# Patient Record
Sex: Male | Born: 1945
Health system: Southern US, Community
[De-identification: ages and names within clinical notes are randomized; demographics above are authoritative.]

## PROBLEM LIST (undated history)

## (undated) DIAGNOSIS — M1990 Unspecified osteoarthritis, unspecified site: Secondary | ICD-10-CM

## (undated) DIAGNOSIS — G4730 Sleep apnea, unspecified: Secondary | ICD-10-CM

## (undated) DIAGNOSIS — K589 Irritable bowel syndrome without diarrhea: Secondary | ICD-10-CM

## (undated) DIAGNOSIS — E7800 Pure hypercholesterolemia, unspecified: Secondary | ICD-10-CM

## (undated) DIAGNOSIS — J8410 Pulmonary fibrosis, unspecified: Secondary | ICD-10-CM

## (undated) DIAGNOSIS — G8929 Other chronic pain: Secondary | ICD-10-CM

## (undated) DIAGNOSIS — K219 Gastro-esophageal reflux disease without esophagitis: Secondary | ICD-10-CM

## (undated) DIAGNOSIS — F419 Anxiety disorder, unspecified: Secondary | ICD-10-CM

## (undated) DIAGNOSIS — M549 Dorsalgia, unspecified: Secondary | ICD-10-CM

## (undated) DIAGNOSIS — R002 Palpitations: Secondary | ICD-10-CM

## (undated) DIAGNOSIS — I10 Essential (primary) hypertension: Secondary | ICD-10-CM

## (undated) DIAGNOSIS — L409 Psoriasis, unspecified: Secondary | ICD-10-CM

## (undated) DIAGNOSIS — K5792 Diverticulitis of intestine, part unspecified, without perforation or abscess without bleeding: Secondary | ICD-10-CM

## (undated) DIAGNOSIS — K635 Polyp of colon: Secondary | ICD-10-CM

## (undated) HISTORY — DX: Polyp of colon: K63.5

## (undated) HISTORY — PX: TONSILLECTOMY: SUR1361

## (undated) HISTORY — DX: Unspecified osteoarthritis, unspecified site: M19.90

## (undated) HISTORY — DX: Pulmonary fibrosis, unspecified: J84.10

## (undated) HISTORY — PX: COLONOSCOPY: SHX174

## (undated) HISTORY — DX: Psoriasis, unspecified: L40.9

## (undated) HISTORY — DX: Sleep apnea, unspecified: G47.30

## (undated) HISTORY — DX: Irritable bowel syndrome, unspecified: K58.9

## (undated) HISTORY — DX: Other chronic pain: G89.29

## (undated) HISTORY — PX: KNEE ARTHROSCOPY: SUR90

## (undated) HISTORY — PX: FOOT FRACTURE SURGERY: SHX645

## (undated) HISTORY — DX: Dorsalgia, unspecified: M54.9

## (undated) HISTORY — PX: UPPER GASTROINTESTINAL ENDOSCOPY: SHX188

---

## 1998-03-17 ENCOUNTER — Emergency Department (HOSPITAL_COMMUNITY): Admission: EM | Admit: 1998-03-17 | Discharge: 1998-03-17 | Payer: Self-pay | Admitting: Emergency Medicine

## 1998-05-31 ENCOUNTER — Encounter: Payer: Self-pay | Admitting: Emergency Medicine

## 1998-05-31 ENCOUNTER — Emergency Department (HOSPITAL_COMMUNITY): Admission: EM | Admit: 1998-05-31 | Discharge: 1998-05-31 | Payer: Self-pay | Admitting: Emergency Medicine

## 1998-06-08 ENCOUNTER — Emergency Department (HOSPITAL_COMMUNITY): Admission: EM | Admit: 1998-06-08 | Discharge: 1998-06-08 | Payer: Self-pay | Admitting: Emergency Medicine

## 1998-06-08 ENCOUNTER — Encounter: Payer: Self-pay | Admitting: Family Medicine

## 1998-06-08 ENCOUNTER — Encounter: Payer: Self-pay | Admitting: Emergency Medicine

## 1998-06-08 ENCOUNTER — Ambulatory Visit (HOSPITAL_COMMUNITY): Admission: RE | Admit: 1998-06-08 | Discharge: 1998-06-08 | Payer: Self-pay | Admitting: Family Medicine

## 1998-08-19 ENCOUNTER — Emergency Department (HOSPITAL_COMMUNITY): Admission: EM | Admit: 1998-08-19 | Discharge: 1998-08-19 | Payer: Self-pay | Admitting: Emergency Medicine

## 1998-08-20 ENCOUNTER — Ambulatory Visit (HOSPITAL_COMMUNITY): Admission: RE | Admit: 1998-08-20 | Discharge: 1998-08-20 | Payer: Self-pay | Admitting: *Deleted

## 1998-10-02 ENCOUNTER — Encounter: Payer: Self-pay | Admitting: General Surgery

## 1998-10-02 ENCOUNTER — Ambulatory Visit (HOSPITAL_COMMUNITY): Admission: RE | Admit: 1998-10-02 | Discharge: 1998-10-02 | Payer: Self-pay | Admitting: General Surgery

## 2000-02-20 ENCOUNTER — Encounter: Payer: Self-pay | Admitting: Emergency Medicine

## 2000-02-20 ENCOUNTER — Emergency Department (HOSPITAL_COMMUNITY): Admission: EM | Admit: 2000-02-20 | Discharge: 2000-02-20 | Payer: Self-pay | Admitting: Emergency Medicine

## 2000-08-06 ENCOUNTER — Emergency Department (HOSPITAL_COMMUNITY): Admission: EM | Admit: 2000-08-06 | Discharge: 2000-08-06 | Payer: Self-pay | Admitting: *Deleted

## 2000-08-06 ENCOUNTER — Encounter: Payer: Self-pay | Admitting: *Deleted

## 2001-10-08 ENCOUNTER — Encounter: Payer: Self-pay | Admitting: Family Medicine

## 2001-10-08 ENCOUNTER — Encounter: Admission: RE | Admit: 2001-10-08 | Discharge: 2001-10-08 | Payer: Self-pay | Admitting: Family Medicine

## 2002-06-15 ENCOUNTER — Encounter: Payer: Self-pay | Admitting: Family Medicine

## 2002-06-15 ENCOUNTER — Encounter: Admission: RE | Admit: 2002-06-15 | Discharge: 2002-06-15 | Payer: Self-pay | Admitting: Family Medicine

## 2002-12-16 ENCOUNTER — Inpatient Hospital Stay (HOSPITAL_COMMUNITY): Admission: EM | Admit: 2002-12-16 | Discharge: 2002-12-18 | Payer: Self-pay | Admitting: Emergency Medicine

## 2003-02-06 DIAGNOSIS — E785 Hyperlipidemia, unspecified: Secondary | ICD-10-CM | POA: Insufficient documentation

## 2003-03-17 ENCOUNTER — Encounter: Payer: Self-pay | Admitting: Gastroenterology

## 2004-02-21 ENCOUNTER — Observation Stay (HOSPITAL_COMMUNITY): Admission: EM | Admit: 2004-02-21 | Discharge: 2004-02-22 | Payer: Self-pay | Admitting: Emergency Medicine

## 2004-02-21 ENCOUNTER — Ambulatory Visit: Payer: Self-pay | Admitting: *Deleted

## 2004-03-04 ENCOUNTER — Ambulatory Visit: Payer: Self-pay | Admitting: Family Medicine

## 2004-03-29 ENCOUNTER — Ambulatory Visit: Payer: Self-pay | Admitting: Family Medicine

## 2004-04-02 ENCOUNTER — Ambulatory Visit: Payer: Self-pay | Admitting: Family Medicine

## 2004-05-20 ENCOUNTER — Ambulatory Visit: Payer: Self-pay | Admitting: Family Medicine

## 2004-05-29 ENCOUNTER — Ambulatory Visit: Payer: Self-pay | Admitting: Family Medicine

## 2004-07-16 ENCOUNTER — Ambulatory Visit: Payer: Self-pay | Admitting: Family Medicine

## 2004-08-30 ENCOUNTER — Ambulatory Visit: Payer: Self-pay | Admitting: Family Medicine

## 2004-09-09 ENCOUNTER — Ambulatory Visit: Payer: Self-pay | Admitting: Family Medicine

## 2004-09-18 ENCOUNTER — Ambulatory Visit: Payer: Self-pay | Admitting: Family Medicine

## 2004-10-01 ENCOUNTER — Ambulatory Visit: Payer: Self-pay | Admitting: Family Medicine

## 2004-10-03 ENCOUNTER — Ambulatory Visit: Payer: Self-pay | Admitting: Family Medicine

## 2005-02-07 ENCOUNTER — Ambulatory Visit: Payer: Self-pay | Admitting: Family Medicine

## 2005-02-20 ENCOUNTER — Ambulatory Visit: Payer: Self-pay | Admitting: Family Medicine

## 2005-03-03 ENCOUNTER — Ambulatory Visit: Payer: Self-pay | Admitting: Family Medicine

## 2005-03-24 ENCOUNTER — Ambulatory Visit: Payer: Self-pay | Admitting: Family Medicine

## 2005-03-26 ENCOUNTER — Ambulatory Visit: Payer: Self-pay | Admitting: Family Medicine

## 2005-05-06 ENCOUNTER — Ambulatory Visit: Payer: Self-pay | Admitting: Family Medicine

## 2005-05-10 ENCOUNTER — Ambulatory Visit: Payer: Self-pay | Admitting: Family Medicine

## 2005-06-13 ENCOUNTER — Ambulatory Visit: Payer: Self-pay | Admitting: Family Medicine

## 2005-09-09 ENCOUNTER — Ambulatory Visit: Payer: Self-pay | Admitting: Family Medicine

## 2006-02-02 ENCOUNTER — Ambulatory Visit: Payer: Self-pay | Admitting: Family Medicine

## 2006-05-20 ENCOUNTER — Ambulatory Visit (HOSPITAL_COMMUNITY): Admission: RE | Admit: 2006-05-20 | Discharge: 2006-05-20 | Payer: Self-pay | Admitting: Family Medicine

## 2007-04-15 ENCOUNTER — Emergency Department (HOSPITAL_COMMUNITY): Admission: EM | Admit: 2007-04-15 | Discharge: 2007-04-15 | Payer: Self-pay | Admitting: Emergency Medicine

## 2007-12-19 ENCOUNTER — Emergency Department (HOSPITAL_COMMUNITY): Admission: EM | Admit: 2007-12-19 | Discharge: 2007-12-19 | Payer: Self-pay | Admitting: Emergency Medicine

## 2008-03-06 ENCOUNTER — Telehealth: Payer: Self-pay | Admitting: Gastroenterology

## 2008-03-06 DIAGNOSIS — K2970 Gastritis, unspecified, without bleeding: Secondary | ICD-10-CM | POA: Insufficient documentation

## 2008-03-06 DIAGNOSIS — K219 Gastro-esophageal reflux disease without esophagitis: Secondary | ICD-10-CM | POA: Insufficient documentation

## 2008-03-06 DIAGNOSIS — K2990 Gastroduodenitis, unspecified, without bleeding: Secondary | ICD-10-CM

## 2008-03-06 DIAGNOSIS — I10 Essential (primary) hypertension: Secondary | ICD-10-CM | POA: Insufficient documentation

## 2008-03-07 ENCOUNTER — Ambulatory Visit: Payer: Self-pay | Admitting: Gastroenterology

## 2008-03-07 DIAGNOSIS — R198 Other specified symptoms and signs involving the digestive system and abdomen: Secondary | ICD-10-CM | POA: Insufficient documentation

## 2008-03-07 DIAGNOSIS — R1010 Upper abdominal pain, unspecified: Secondary | ICD-10-CM | POA: Insufficient documentation

## 2008-03-07 DIAGNOSIS — R143 Flatulence: Secondary | ICD-10-CM

## 2008-03-07 DIAGNOSIS — R142 Eructation: Secondary | ICD-10-CM | POA: Insufficient documentation

## 2008-03-07 DIAGNOSIS — R109 Unspecified abdominal pain: Secondary | ICD-10-CM | POA: Insufficient documentation

## 2008-03-07 DIAGNOSIS — R141 Gas pain: Secondary | ICD-10-CM | POA: Insufficient documentation

## 2008-03-08 LAB — CONVERTED CEMR LAB
ALT: 22 units/L (ref 0–53)
AST: 21 units/L (ref 0–37)
Albumin: 3.9 g/dL (ref 3.5–5.2)
Alkaline Phosphatase: 41 units/L (ref 39–117)
BUN: 9 mg/dL (ref 6–23)
Basophils Absolute: 0.1 10*3/uL (ref 0.0–0.1)
Basophils Relative: 0.6 % (ref 0.0–3.0)
CO2: 28 meq/L (ref 19–32)
Calcium: 9.3 mg/dL (ref 8.4–10.5)
Chloride: 102 meq/L (ref 96–112)
Creatinine, Ser: 1.2 mg/dL (ref 0.4–1.5)
Eosinophils Absolute: 0.2 10*3/uL (ref 0.0–0.7)
Eosinophils Relative: 1.8 % (ref 0.0–5.0)
GFR calc Af Amer: 79 mL/min
GFR calc non Af Amer: 65 mL/min
Glucose, Bld: 107 mg/dL — ABNORMAL HIGH (ref 70–99)
HCT: 44.3 % (ref 39.0–52.0)
Hemoglobin: 15.2 g/dL (ref 13.0–17.0)
Lymphocytes Relative: 19.6 % (ref 12.0–46.0)
MCHC: 34.4 g/dL (ref 30.0–36.0)
MCV: 97.7 fL (ref 78.0–100.0)
Monocytes Absolute: 1.1 10*3/uL — ABNORMAL HIGH (ref 0.1–1.0)
Monocytes Relative: 10.1 % (ref 3.0–12.0)
Neutro Abs: 7 10*3/uL (ref 1.4–7.7)
Neutrophils Relative %: 67.9 % (ref 43.0–77.0)
Platelets: 268 10*3/uL (ref 150–400)
Potassium: 4.2 meq/L (ref 3.5–5.1)
RBC: 4.54 M/uL (ref 4.22–5.81)
RDW: 12.9 % (ref 11.5–14.6)
Sodium: 138 meq/L (ref 135–145)
Total Bilirubin: 0.7 mg/dL (ref 0.3–1.2)
Total Protein: 7 g/dL (ref 6.0–8.3)
WBC: 10.4 10*3/uL (ref 4.5–10.5)

## 2008-03-10 ENCOUNTER — Ambulatory Visit: Payer: Self-pay | Admitting: Gastroenterology

## 2008-03-10 ENCOUNTER — Encounter: Payer: Self-pay | Admitting: Gastroenterology

## 2008-03-13 ENCOUNTER — Encounter: Payer: Self-pay | Admitting: Gastroenterology

## 2008-03-16 ENCOUNTER — Telehealth (INDEPENDENT_AMBULATORY_CARE_PROVIDER_SITE_OTHER): Payer: Self-pay

## 2008-03-30 ENCOUNTER — Ambulatory Visit (HOSPITAL_COMMUNITY): Admission: RE | Admit: 2008-03-30 | Discharge: 2008-03-30 | Payer: Self-pay | Admitting: Family Medicine

## 2008-04-06 ENCOUNTER — Ambulatory Visit: Payer: Self-pay | Admitting: Gastroenterology

## 2008-04-06 DIAGNOSIS — K589 Irritable bowel syndrome without diarrhea: Secondary | ICD-10-CM | POA: Insufficient documentation

## 2009-02-02 ENCOUNTER — Emergency Department (HOSPITAL_COMMUNITY): Admission: EM | Admit: 2009-02-02 | Discharge: 2009-02-02 | Payer: Self-pay | Admitting: Emergency Medicine

## 2009-04-19 ENCOUNTER — Ambulatory Visit: Payer: Self-pay | Admitting: Gastroenterology

## 2009-04-19 DIAGNOSIS — Z860100 Personal history of colon polyps, unspecified: Secondary | ICD-10-CM | POA: Insufficient documentation

## 2010-02-24 ENCOUNTER — Encounter: Payer: Self-pay | Admitting: Emergency Medicine

## 2010-03-05 NOTE — Assessment & Plan Note (Signed)
Summary: yearly f.u...em   History of Present Illness Visit Type: consult  Primary GI MD: Elie Goody [facility] Primary Provider: Karleen Hampshire, MD Requesting Provider: Karleen Hampshire, MD Chief Complaint: Yearly f/u for IBS and GERD. Pt states that he has been doing great and denies any GI complaints  History of Present Illness:   [name] has had very good control of his reflux symptoms and irritable bowel syndrome. He still has urgency stools and loose stools on occasion.   GI Review of Systems      Denies abdominal pain, acid reflux, belching, bloating, chest pain, dysphagia with liquids, dysphagia with solids, heartburn, loss of appetite, nausea, vomiting, vomiting blood, weight loss, and  weight gain.        Denies anal fissure, black tarry stools, change in bowel habit, constipation, diarrhea, diverticulosis, fecal incontinence, heme positive stool, hemorrhoids, irritable bowel syndrome, jaundice, light color stool, liver problems, rectal bleeding, and  rectal pain.   Current Medications (verified): 1)  Cardura 4 Mg Tabs (Doxazosin Mesylate) .Marland Kitchen.. 1 Tab By Mouth Once Daily 2)  Metoprolol Tartrate 25 Mg Tabs (Metoprolol Tartrate) .Marland Kitchen.. 1 Tab By Mouth Two Times A Day 3)  Lipitor 10 Mg Tabs (Atorvastatin Calcium) .Marland Kitchen.. 1 Tab By Mouth Once Daily 4)  Alprazolam 0.5 Mg Tabs (Alprazolam) .... As Needed 5)  Levbid 0.375 Mg  Tb12 (Hyoscyamine Sulfate) .... One Tab Twice Daily For Cramp Relief 6)  Omeprazole 20 Mg Cpdr (Omeprazole) .... One Tablet By Mouth Once Daily  Allergies (verified): 1)  ! Keflex  Past History:  Past Medical History: IBS GASTRITIS (ICD-535.50) GERD (ICD-530.81) HYPERTENSION (ICD-401.9) HYPERLIPIDEMIA (ICD-272.4) Chronic interstitial lung disease Tubulovillous adenomatous colon polyps, 03/2008  Past Surgical History: Reviewed history from 03/07/2008 and no changes required. Hernia repair Knee surgery Tonsillectomy  Family History: Bladder Cancer   Father Breast Cancer  Sister Renal Failure  Mother No FH of Colon Cancer:  Social History: Reviewed history from 03/07/2008 and no changes required. Married Nurse, learning disability Patient has never smoked.  Alcohol Use - yes Illicit Drug Use - no  Review of Systems       The pertinent positives and negatives are noted as above and in the HPI. All other ROS were reviewed and were negative.   Vital Signs:  Patient profile:   65 year old male Height:      71 inches Weight:      259 pounds BMI:     36.25 BSA:     2.36 Pulse rate:   68 / minute Pulse rhythm:   regular BP sitting:   136 / 84  (left arm) Cuff size:   regular  Vitals Entered By: Ok Anis CMA (April 19, 2009 8:43 AM)  Physical Exam  General:  Well developed, well nourished, no acute distress. Head:  Normocephalic and atraumatic. Eyes:  PERRLA, no icterus. Mouth:  No deformity or lesions, dentition normal. Lungs:  Clear throughout to auscultation. Heart:  Regular rate and rhythm; no murmurs, rubs,  or bruits. Abdomen:  Soft, nontender and nondistended. No masses, hepatosplenomegaly or hernias noted. Normal bowel sounds. Psych:  Alert and cooperative. Normal mood and affect.  Impression & Recommendations:  Problem # 1:  IRRITABLE BOWEL SYNDROME (ICD-564.1) Irritable bowel syndrome, under good control on Levbid. He still has loose urgent stools. Trial of Align once daily for one month. Continue if symptoms improved.  Problem # 2:  GERD (ICD-530.81) Continue omeprazole 20 mg q.a.m. and standard antireflux measures.  Problem # 3:  PERSONAL HX COLONIC POLYPS (ICD-V12.72) Tubulovillous adenomatous colon polyps. Surveillance colonoscopy recommended in February 2013.  Patient Instructions: 1)  Pick up your prescription from your pharmacy.  2)  Please continue current medications.  3)  Please schedule a follow-up appointment in 1 year. 4)  Copy sent to : Karleen Hampshire, MD 5)  The medication list was reviewed and  reconciled.  All changed / newly prescribed medications were explained.  A complete medication list was provided to the patient / caregiver.  Prescriptions: LEVBID 0.375 MG  TB12 (HYOSCYAMINE SULFATE) one tab twice daily for cramp relief as needed  #60 x 11   Entered by:   Christie Nottingham CMA (AAMA)   Authorized by:   Meryl Dare MD [facility]   Signed by:   Christie Nottingham CMA (AAMA) on 04/19/2009   Method used:   Electronically to        [facility]. [phone] (retail)       [address]       [place], [place]  [address]       Ph: [phone] or [phone]       Fax: [phone]   RxID:   [id number]

## 2010-05-03 ENCOUNTER — Ambulatory Visit (HOSPITAL_COMMUNITY)
Admission: RE | Admit: 2010-05-03 | Discharge: 2010-05-03 | Disposition: A | Discharge status: Home / Self Care | Payer: No Typology Code available for payment source | Source: Ambulatory Visit | Attending: Internal Medicine | Admitting: Internal Medicine

## 2010-05-03 ENCOUNTER — Other Ambulatory Visit (HOSPITAL_COMMUNITY): Payer: Self-pay | Admitting: Internal Medicine

## 2010-05-03 DIAGNOSIS — M7730 Calcaneal spur, unspecified foot: Secondary | ICD-10-CM | POA: Insufficient documentation

## 2010-05-03 DIAGNOSIS — M25579 Pain in unspecified ankle and joints of unspecified foot: Secondary | ICD-10-CM | POA: Insufficient documentation

## 2010-05-03 DIAGNOSIS — R52 Pain, unspecified: Secondary | ICD-10-CM

## 2010-05-06 LAB — DIFFERENTIAL
Basophils Absolute: 0.1 10*3/uL (ref 0.0–0.1)
Basophils Relative: 1 % (ref 0–1)
Eosinophils Absolute: 0.5 10*3/uL (ref 0.0–0.7)
Eosinophils Relative: 5 % (ref 0–5)
Lymphocytes Relative: 24 % (ref 12–46)
Lymphs Abs: 2.3 10*3/uL (ref 0.7–4.0)
Monocytes Absolute: 0.9 10*3/uL (ref 0.1–1.0)
Monocytes Relative: 9 % (ref 3–12)
Neutro Abs: 5.9 10*3/uL (ref 1.7–7.7)
Neutrophils Relative %: 61 % (ref 43–77)

## 2010-05-06 LAB — CBC
HCT: 44.4 % (ref 39.0–52.0)
Hemoglobin: 15.4 g/dL (ref 13.0–17.0)
MCHC: 34.8 g/dL (ref 30.0–36.0)
MCV: 97.3 fL (ref 78.0–100.0)
Platelets: 232 10*3/uL (ref 150–400)
RBC: 4.56 MIL/uL (ref 4.22–5.81)
RDW: 13.4 % (ref 11.5–15.5)
WBC: 9.6 10*3/uL (ref 4.0–10.5)

## 2010-05-06 LAB — BASIC METABOLIC PANEL
BUN: 18 mg/dL (ref 6–23)
CO2: 30 mEq/L (ref 19–32)
Calcium: 9 mg/dL (ref 8.4–10.5)
Chloride: 102 mEq/L (ref 96–112)
Creatinine, Ser: 1.06 mg/dL (ref 0.4–1.5)
GFR calc Af Amer: >60 mL/min (ref >60)
GFR calc non Af Amer: >60 mL/min (ref >60)
Glucose, Bld: 133 mg/dL — ABNORMAL HIGH (ref 70–99)
Potassium: 3.9 mEq/L (ref 3.5–5.1)
Sodium: 140 mEq/L (ref 135–145)

## 2010-05-06 LAB — ETHANOL: Alcohol, Ethyl (B): <5 mg/dL (ref 0–10)

## 2010-06-21 NOTE — Discharge Summary (Signed)
NAMEJOHNATHYN, [name]                ACCOUNT NO.:  [id number]   MEDICAL RECORD NO.:  [id number]          PATIENT TYPE:  INP   LOCATION:  4707                         FACILITY:  [facility]   PHYSICIAN:  Carole Binning, M.D. LHCDATE OF BIRTH:  Jul 02, 1945   DATE OF ADMISSION:  02/21/2004  DATE OF DISCHARGE:                                 DISCHARGE SUMMARY   PROCEDURES:  1.  Cardiac catheterization.  2.  Coronary arteriogram.  3.  Left ventriculogram.   HOSPITAL COURSE:  Alexander Nunez is a 65 year old male with no known history of  coronary artery disease.  He has a history of palpitations and right bundle  branch block.  He had a remote exercise Cardiolite that was without  abnormality, per the patient.  Alexander Nunez developed chest tightness at  approximately 10 a.m. while at work.  His symptoms got worse with  ambulation, and the discomfort would not go away.  He continued to complain  of chest pressure and came to the emergency room.  He was admitted for  further evaluation and treatment.  His cardiac enzymes were negative for MI.  It was felt that he needed better blood pressure control, and Lopressor was  added to his medication regimen.  Dr. Gerri Spore evaluated him and felt that  cardiac catheterization was indicated, and this was performed on February 22, 2004.   Dr. Juanda Chance performed the cardiac catheterization, and his coronary arteries  and his left ventriculogram were all normal.  Dr. Juanda Chance felt that Alexander Nunez  could be discharged home when his bedrest was complete.  He recommended the  addition of Lopressor for blood pressure control, and then patient had  tolerated this well.  Alexander Nunez is tentatively considered stable for  discharge on February 22, 2004 and is to follow up as an outpatient with Dr.  Hetty Ely and with Dr. Gerri Spore on a p.r.n. basis.   DISCHARGE DIAGNOSES:  1.  Chest pain, no significant coronary artery disease by catheterization.  2.  Hypertension.  3.   Hyperlipidemia.  4.  Anxiety.  5.  Remote history of tobacco use.  6.  Depression.  7.  Obesity.  8.  Gastroesophageal reflux disease symptoms.  9.  Status post foot surgery and tonsillectomy.   DISCHARGE INSTRUCTIONS:  1.  Activity levels to include no strenuous activity for two days.  2.  He is to call the office for problems with the cath site.  3.  He is to stick to a low-fat diet.  4.  He is to follow up with Dr. Gerri Spore as needed and see Dr. Hetty Ely      within two weeks.   DISCHARGE MEDICATIONS:  1.  Lipitor 20 mg q.d.  2.  Xanax 0.25 mg p.r.n.  3.  Cardura 4 mg q.d.  4.  Zoloft 50 mg q.d.  5.  Aciphex 20 mg q.d.  6.  Aspirin 81 mg q.d.      RB/MEDQ  D:  02/22/2004  T:  02/22/2004  Job:  [id number]   cc:   Laurita Quint, M.D.  [address] [place]  [place]  [place] [address]  Fax: [phone]

## 2010-06-21 NOTE — H&P (Signed)
Alexander Nunez, Alexander Nunez NO.:  [id number]   MEDICAL RECORD NO.:  [id number]          PATIENT TYPE:  INP   LOCATION:  1825                         FACILITY:  [facility]   PHYSICIAN:  Carole Binning, M.D. LHCDATE OF BIRTH:  1945-04-01   DATE OF ADMISSION:  02/21/2004  DATE OF DISCHARGE:                                HISTORY & PHYSICAL   CHIEF COMPLAINT:  Chest pain.   HISTORY OF PRESENT ILLNESS:  This is a 65 year old male who presents to  [facility] emergency room for evaluation of chest pain.  The  patient has a previous history of palpitations, and a history of right  bundle branch block.  He had been see in the past by Dr. Luther Parody and  Dr. Garnette Scheuermann.  He had an exercise Cardiolite, he believes about 6 years  ago, that was negative according to the patient.   The patient was at work approximately 10 a.m. today when he developed chest  pressure.  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  He stated it felt somewhat like his indigestion that he normally  has; however, it was higher in his chest than normal.  He reports it as  being a pressure sensation, at worst an 8 on a 1-10 scale, with no  associated symptoms such as shortness of breath, diaphoresis, or nausea.  There was no radiation of the pain, except across his chest.  The pain was  continuous.  He went to eat lunch, but decided that he felt so bad he  thought he should come to the emergency department.  On arrival to the [facility] emergency room, he received aspirin, nitroglycerin, and  morphine, with some relief in his pain; however, he continued to have pain  rated at approximately 3-4 on a 1-10 scale.  In talking to the patient, he  also remembers having severe indigestion Sunday night.  The patient was seen  by Dr. Gerri Spore and myself, and the decision was made to admit the patient  to rule out an MI, and to plan for cardiac catheterization  tomorrow.   PAST MEDICAL HISTORY:  1.  Significant for previous chest pain approximately one year ago.  MI was      ruled out at that time.  The patient had an upper endoscopy that was      negative.  Apparently, he did not have a stress test.  2.  He has a history of anxiety and possible depression, he has been treated      with Xanax and Zoloft.  3.  He has a history of hypertension.  4.  Elevated cholesterol levels.  5.  He has a remote tobacco history.  He did smoke approximately 2 packs per      day for about 20 years.  He quit 25-30 years ago.  6.  He has had foot surgery.  7.  Tonsil surgery.  8.  He has no history of diabetes.   ALLERGIES:  KEFLEX.   MEDICATIONS PRIOR TO ADMISSION:  1.  Lipitor, dosage unknown.  2.  Xanax 0.25 mg p.r.n.  3.  Cardura 4 mg daily.  4.  Zoloft, dosage uncertain, but he believes 50 mg per day.  5.  Aciphex daily.   SOCIAL HISTORY:  The patient is married.  He lives in [place].  He has 2  sons.  He walks occasionally for exercise.  He quit smoking 25-30 years ago.  He drinks 3-4 beers per day.  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   FAMILY HISTORY:  The patient's mother died in her 39s from renal failure and  congestive heart failure.  His father died from cancer in his 66s.  He had a  sister who died from cancer.  Another sister is alive and well in her 47s.   REVIEW OF SYSTEMS:  Please see history of present illness.  Also, the  patient has had some recent sinus headaches.  He has an occasional rash that  has been evaluated by a dermatologist in the past with no specific  diagnosis.  He has chest pain, as noted above.  He has a history of  palpitations.  History of anxiety and depression.  Gastroesophageal reflux  disease and frequent indigestion.   PHYSICAL EXAMINATION:  GENERAL:  A pleasant 65 year old white male in no  acute distress.  VITAL SIGNS:  Blood pressure 133/75, pulse 74.  HEENT:   Unremarkable.  NECK:  No bruits, no jugular venous distention.  HEART:  Regular rate and rhythm without murmur.  LUNGS:  Clear.  ABDOMEN:  Obese soft, nontender, with positive bowel sounds.  EXTREMITIES:  Pulses intact without edema.  SKIN:  Warm and dry.  NEUROLOGIC:  Grossly intact.   LABORATORY DATA:  Chest x-ray showed mild cardiomegaly with no active  disease.  An EKG shows normal sinus rhythm, rate 82 beats per minute, with  an incomplete right bundle branch block.  Other labs are significant for a  BUN of 13, creatinine 1.1, potassium 3.9, glucose 102.  LFT's were normal.  PTT was 28.  CBC revealed a hemoglobin of 15.8, hematocrit 45.2, WBC's 8.2,  platelets 274,000.  Point of care enzymes were negative x1.   IMPRESSION:  1.  Chest pain of uncertain etiology.  2.  History of hypertension.  3.  Elevated cholesterol levels.  4.  Remote tobacco history.  5.  History of obesity.  6.  History of anxiety and depression.  7.  Negative exercise Cardiolite approximately 6 years ago.  8.  History of gastroesophageal reflux disease and indigestion.   PLAN:  Admit the patient to rule out a myocardial infarction.  We will start  him on IV nitroglycerin, p.o. beta blocker, aspirin, and IV heparin.  He  will be placed on the cath board for tomorrow as an add on.  He will be  cathed emergently if his condition worsens.      DR/MEDQ  D:  02/21/2004  T:  02/21/2004  Job:  [id number]   cc:   Laurita Quint, M.D.  [address] [place]  [place] [address]  Fax: [phone]

## 2010-06-21 NOTE — Discharge Summary (Signed)
NAME:  Alexander Nunez, Alexander Nunez                          ACCOUNT NO.:  [id number]   MEDICAL RECORD NO.:  [id number]                   PATIENT TYPE:  INP   LOCATION:  4731                                 FACILITY:  [facility]   PHYSICIAN:  Bruce Rexene Edison. Swords, M.D. [facility]           DATE OF BIRTH:  09/10/1945   DATE OF ADMISSION:  12/16/2002  DATE OF DISCHARGE:  12/18/2002                                 DISCHARGE SUMMARY   DISCHARGE DIAGNOSES:  1. Epigastric/chest pain, unknown etiology.  2. Hyperlipidemia.  3. Hypertension.  4. Gastroesophageal reflux.  5. Ventral hernia repair.  6. History of knee surgery.  7. Mild chronic interstitial lung disease (unknown diagnosis).   DISCHARGE MEDICATIONS:  1. Nexium 40 mg p.o. daily.  2. Cardura 4 mg p.o. q.h.s.  3. Lipitor 20 mg p.o. daily.  4. Xanax p.r.n.  5. Vicodin 1-2 tablets p.o. q.6h. p.r.n.   FOLLOW-UP:  Dr. Hetty Ely this week.  I think he probably should have an  endoscopy and a stress Cardiolite scheduled.   CONDITION ON DISCHARGE:  Improved but still with some epigastric discomfort.   HOSPITAL PROCEDURES:  1. CT abdomen and pelvis.  Preliminary report:  Abdomen is normal.  Pelvis     demonstrates some sigmoid diverticula.  Final report pending.  2. Chest x-ray demonstrated cardiomegaly and mild chronic interstitial lung     disease.   HOSPITAL LABORATORIES:  Hepatic function panel normal.  CBC normal.  Lipase  normal at 26, amylase normal at 88.  Cardiac enzymes were all normal.  CBC  on admission was normal with a hemoglobin of 15.5 and a white count of 7000.   HOSPITAL COURSE:  The patient was admitted to the hospital service on  December 16, 2002.  Please see my dictated note for details.  On the first  day the patient complained of chest tightness and epigastric discomfort  which he thought was related to reflux pain but not relieved with typical  antacids or PPI.  The patient was admitted, ruled out for an MI.  The pain  persisted  the following day.  A CT of the abdomen and pelvis was obtained  along with additional laboratory work as outlined above.  Results of the CT  as above.  Etiology of pain unknown.  I had a  discussion with the patient and wife at the time of discharge.  I think he  will need an outpatient endoscopy and stress Cardiolite performed.  Those  arrangements will be made with Dr. Hetty Ely.  I have left a message for Dr.  Hetty Ely, and if there are any problems the patient knows to contact me at  my office.  Bruce Rexene Edison Swords, M.D. [facility]    BHS/MEDQ  D:  12/18/2002  T:  12/18/2002  Job:  [id number]   cc:   Laurita Quint, M.D.  [address] [place]  [place] [address]  Fax: [phone]

## 2010-06-21 NOTE — Cardiovascular Report (Signed)
NAME:  TRICE, ASPINALL NO.:  [id number]   MEDICAL RECORD NO.:  [id number]          PATIENT TYPE:  INP   LOCATION:  4707                         FACILITY:  [facility]   PHYSICIAN:  Charlies Constable, M.D. [facility] DATE OF BIRTH:  [birth year]   DATE OF PROCEDURE:  02/22/2004  DATE OF DISCHARGE:                              CARDIAC CATHETERIZATION   CLINICAL HISTORY:  Mr. Gjerde is 65 years old and has no prior history of  known heart disease, although he does have positive risk factors.  He was  admitted to the hospital yesterday with chest tightness which occurred at  work and then became worse with activity.  He came to the [facility]  Emergency Room and was admitted by Dr. Gerri Spore.   DESCRIPTION OF PROCEDURE:  The procedure was performed via the right femoral  artery.  An arterial sheath and 6 [place] free form coronary catheters.  A  femoral arterial puncture was performed and Omnipaque contrast was used.  A  distal aortogram was performed to rule out renovascular causes for  hypertension.  The right femoral artery was closed with Angio-Seal at the  end of the procedure.  The patient tolerated the procedure well and left the  laboratory in satisfactory condition.   RESULTS:  Left main coronary artery:  The left main coronary artery was free  of significant disease.   Left anterior descending artery:  The left anterior descending artery gave  rise to two diagonal branches and a large and three small septal  perforators.  These were free of significant disease.   Circumflex artery:  The circumflex artery gave rise to a ramus branch, a  marginal branch, an atrial branch and two posterolateral branches.  These  vessels were free of significant disease.   Right coronary artery:  The right coronary artery was a moderate size vessel  and gave rise to a conus branch, two right ventricular branches, a posterior  descending branch and two posterolateral branches.  These vessels were  free  of significant disease.   Left ventriculogram:  The left ventriculogram performed in the RAO  projection showed good wall motion with no areas of hypokinesis.  The  estimated ejection fraction was 60%.   Distal aortogram:  A distal aortogram was performed which showed patent  renal arteries and no significant aortic obstruction.   The aortic pressure was 165/92 with a mean of 123.  The left ventricular  pressure was 165/10.   CONCLUSION:  Normal coronary angiography and left ventricular function.   RECOMMENDATIONS:  Reassurance.  In view of these findings, I think that the  patient's symptoms are not cardiac.  Will plan discharge later today and  arrange followup with Dr. Hetty Ely.  I discussed the findings with Dr.  Hetty Ely.       BB/MEDQ  D:  02/22/2004  T:  02/22/2004  Job:  [id number]   cc:   Laurita Quint, M.D.  [address] [place]  [place] [address]  Fax: [phone]   Cardiopulmonary Laboratory

## 2010-06-21 NOTE — H&P (Signed)
NAME:  Alexander Nunez, LEINER                          ACCOUNT NO.:  [id number]   MEDICAL RECORD NO.:  [id number]                   PATIENT TYPE:  INP   LOCATION:  1843                                 FACILITY:  [facility]   PHYSICIAN:  Bruce Rexene Edison. Swords, M.D. [facility]           DATE OF BIRTH:  Aug 19, 1945   DATE OF ADMISSION:  12/16/2002  DATE OF DISCHARGE:                                HISTORY & PHYSICAL   CHIEF COMPLAINT:  Chest pain.   HISTORY OF PRESENT ILLNESS:  [name] is a 65 year old gentleman, a patient  of Laurita Quint, M.D., of [place].  He has a 24-hour history of chest  tightness.  He describes developing chest tightness yesterday around noon,  which he says is typical of his reflux pain.  Normally his reflux pain is  relieved with some antacids, Nexium, or belching.  None of this was true  yesterday.  He denies any radiation of his pain.  There is no shortness of  breath.  There is no nausea and no vomiting.  There is no exertional  component to the pain.  He denies any shortness of breath, PND, or  orthopnea.   PAST MEDICAL HISTORY:  Significant for hyperlipidemia, hypertension,  gastroesophageal reflux disease.  Only surgeries include ventral hernia and  a knee surgery.   FAMILY HISTORY:  Mother deceased with some sort of heart trouble and  eventually died with congestive heart failure.  Father deceased with bladder  cancer.  Two sisters, one deceased with breast cancer, one alive and well.   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  He  is a nonsmoker (quit 25 years ago).  He drinks alcohol occasionally.   REVIEW OF SYSTEMS:  He denies any abdominal pain, shortness of breath, PND,  GI blood loss, rashes, any other complaints in the review of systems.   PHYSICAL EXAMINATION:  VITAL SIGNS:  Temperature 97.9, respirations 24,  blood pressure 168/102, heart rate 87.  At the time of my examination his  respirations were 14, his blood pressure  120/60.  Saturations 97% on 2 L.  GENERAL:  He appears as an overweight male in no acute distress.  HEENT:  Normocephalic, extraocular muscles are intact.  NECK:  Supple without lymphadenopathy, thyromegaly, jugular venous  distention, or carotid bruits.  CHEST:  Clear to auscultation without any increased work of breathing.  There is no chest pain to palpation.  CARDIAC:  S1 and S2 are normal without murmurs or gallops.  ABDOMEN:  Active bowel sounds, soft, nontender.  There is no  hepatosplenomegaly, no masses are palpated.  EXTREMITIES:  There is no clubbing, cyanosis, or edema.  NEUROLOGIC:  He is alert and oriented without any motor or sensory deficits.   EKG demonstrates normal sinus rhythm with an incomplete right bundle branch  block.  CBC is normal.  Protime with an INR of 0.8.  Myoglobin is 92.2, CK-  MB is 1.3, troponin I less than 0.05.  Chest x-ray was done, result pending.   ASSESSMENT AND PLAN:  Chest pain, unknown cause.  These are mostly atypical  symptoms, but he does have multiple risk factors for heart disease.  I think  he needs further evaluation.  Will admit to rule out for a myocardial  infarction.  Will increase his Nexium to b.i.d.  His blood pressure is  stable.  I think the other issues as outlined above can be followed as an  outpatient.  He and his wife understand this plan.  I do think he will need  a stress Cardiolite at some point and may need to be done in the hospital.                                                Bruce H. Swords, M.D. [facility]    BHS/MEDQ  D:  12/16/2002  T:  12/16/2002  Job:  [id number]   cc:   Laurita Quint, M.D.  [address] [place]  [place] [address]  Fax: [phone]

## 2010-08-13 ENCOUNTER — Emergency Department (HOSPITAL_COMMUNITY)
Admission: EM | Admit: 2010-08-13 | Discharge: 2010-08-14 | Disposition: A | Discharge status: Home / Self Care | Payer: No Typology Code available for payment source | Attending: Emergency Medicine | Admitting: Emergency Medicine

## 2010-08-13 ENCOUNTER — Encounter: Payer: Self-pay | Admitting: *Deleted

## 2010-08-13 DIAGNOSIS — IMO0002 Reserved for concepts with insufficient information to code with codable children: Secondary | ICD-10-CM

## 2010-08-13 DIAGNOSIS — I10 Essential (primary) hypertension: Secondary | ICD-10-CM | POA: Insufficient documentation

## 2010-08-13 DIAGNOSIS — X58XXXA Exposure to other specified factors, initial encounter: Secondary | ICD-10-CM | POA: Insufficient documentation

## 2010-08-13 DIAGNOSIS — T1490XA Injury, unspecified, initial encounter: Secondary | ICD-10-CM | POA: Insufficient documentation

## 2010-08-13 DIAGNOSIS — S60459A Superficial foreign body of unspecified finger, initial encounter: Secondary | ICD-10-CM | POA: Insufficient documentation

## 2010-08-13 HISTORY — DX: Gastro-esophageal reflux disease without esophagitis: K21.9

## 2010-08-13 HISTORY — DX: Pure hypercholesterolemia, unspecified: E78.00

## 2010-08-13 HISTORY — DX: Essential (primary) hypertension: I10

## 2010-08-13 HISTORY — DX: Anxiety disorder, unspecified: F41.9

## 2010-08-13 HISTORY — DX: Palpitations: R00.2

## 2010-08-13 MED ORDER — LIDOCAINE HCL 2 % IJ SOLN
INTRAMUSCULAR | Status: AC
  Filled 2010-08-13: qty 1

## 2010-08-13 MED ORDER — LIDOCAINE HCL (PF) 2 % IJ SOLN
10.0000 mL | Freq: Once | INTRAMUSCULAR | Status: DC
  Filled 2010-08-13: qty 10

## 2010-08-13 NOTE — ED Notes (Signed)
Part of fish hook stuck in left index finger

## 2010-08-13 NOTE — ED Notes (Signed)
Pt has the end of a fish hook in left index finger, states that he was attempting to take the hook out of a bass mouth when the bass starting flopping and the hook caught his finger, cms intact

## 2010-08-14 MED ORDER — DOXYCYCLINE HYCLATE 100 MG PO TABS
100.0000 mg | ORAL_TABLET | Freq: Once | ORAL | Status: AC
  Administered 2010-08-14: 100 mg via ORAL
  Filled 2010-08-14: qty 1

## 2010-08-14 MED ORDER — DOXYCYCLINE HYCLATE 100 MG PO CAPS: 100.0000 mg | ORAL_CAPSULE | Freq: Two times a day (BID) | ORAL | Status: AC

## 2010-08-14 MED ORDER — TETANUS-DIPHTH-ACELL PERTUSSIS 5-2-15.5 LF-MCG/0.5 IM SUSP
0.5000 mL | Freq: Once | INTRAMUSCULAR | Status: AC
  Administered 2010-08-14: 0.5 mL via INTRAMUSCULAR
  Filled 2010-08-14: qty 0.5

## 2010-08-14 MED ORDER — SULFAMETHOXAZOLE-TMP DS 800-160 MG PO TABS: 1.0000 | ORAL_TABLET | Freq: Once | ORAL | Status: DC

## 2010-08-17 NOTE — ED Provider Notes (Signed)
History     Chief Complaint  Patient presents with  . Foreign Body in Skin    fish hook in left index finger   HPI Comments: Patient sustained a fish hook with barb to his left index finger volar middle phalanx prior to arrival.  He has tried to remove himself without success.  He reports pain to the digit,  Denies numbness distal to the injury site.  The injury occurred several hours prior to arrival.  He denies any other injury.  The history is provided by the patient.    Past Medical History  Diagnosis Date  . High cholesterol   . Hypertension   . Heart palpitations   . GERD (gastroesophageal reflux disease)   . Anxiety     Past Surgical History  Procedure Date  . Foot fracture surgery     No family history on file.  History  Substance Use Topics  . Smoking status: Never Smoker   . Smokeless tobacco: Not on file  . Alcohol Use: No      Review of Systems  All other systems reviewed and are negative.    Physical Exam  BP 150/72  Pulse 93  Temp(Src) 98.5 F (36.9 C) (Oral)  Resp 16  Ht 5\' 11"  (1.803 m)  Wt 263 lb (119.296 kg)  BMI 36.68 kg/m2  SpO2 94%  Physical Exam  Vitals reviewed. Constitutional: He is oriented to person, place, and time. He appears well-developed and well-nourished.  HENT:  Head: Normocephalic and atraumatic.  Eyes: Conjunctivae are normal.  Neck: Neck supple.  Pulmonary/Chest: Effort normal and breath sounds normal. He has no wheezes.  Musculoskeletal: Normal range of motion. He exhibits no edema and no tenderness.       Hands: Neurological: He is alert and oriented to person, place, and time.  Skin: Skin is warm and dry.  Psychiatric: He has a normal mood and affect.    ED Course  FOREIGN BODY REMOVAL Performed by: Hiren Peplinski L Authorized by: Burgess Amor L Consent: Verbal consent obtained. Consent given by: patient Patient understanding: patient states understanding of the procedure being performed Intake: left index  finger. Anesthesia: digital block Local anesthetic: lidocaine 2% without epinephrine Complexity: simple 1 objects recovered. Objects recovered: fish hook Post-procedure assessment: foreign body removed Patient tolerance: Patient tolerated the procedure well with no immediate complications. Comments: Removed by advancing hook through the skin.  Flushed with NS copiously after the hook removed. Dressing per rn.    MDM       Candis Musa, PA 08/17/10 2055

## 2010-08-28 NOTE — ED Provider Notes (Addendum)
  Medical screening examination/treatment/procedure(s) were performed by non-physician practitioner and as supervising physician I was immediately available for consultation/collaboration.  Nicoletta Dress. Colon Branch, MD 08/28/10 2011  Nicoletta Dress. Colon Branch, MD 09/13/10 [phone]

## 2010-10-28 LAB — POCT CARDIAC MARKERS
CKMB, poc: <1 — ABNORMAL LOW
Operator id: 211291
Troponin i, poc: <0.05

## 2011-03-26 ENCOUNTER — Encounter: Payer: Self-pay | Admitting: Gastroenterology

## 2011-04-09 DIAGNOSIS — M545 Low back pain, unspecified: Secondary | ICD-10-CM | POA: Diagnosis not present

## 2011-04-10 DIAGNOSIS — M545 Low back pain, unspecified: Secondary | ICD-10-CM | POA: Diagnosis not present

## 2011-04-17 DIAGNOSIS — M5137 Other intervertebral disc degeneration, lumbosacral region: Secondary | ICD-10-CM | POA: Diagnosis not present

## 2011-04-17 DIAGNOSIS — Z09 Encounter for follow-up examination after completed treatment for conditions other than malignant neoplasm: Secondary | ICD-10-CM | POA: Diagnosis not present

## 2011-04-17 DIAGNOSIS — L408 Other psoriasis: Secondary | ICD-10-CM | POA: Diagnosis not present

## 2011-04-17 DIAGNOSIS — Z79899 Other long term (current) drug therapy: Secondary | ICD-10-CM | POA: Diagnosis not present

## 2011-04-22 DIAGNOSIS — Z01810 Encounter for preprocedural cardiovascular examination: Secondary | ICD-10-CM | POA: Diagnosis not present

## 2011-04-22 DIAGNOSIS — E782 Mixed hyperlipidemia: Secondary | ICD-10-CM | POA: Diagnosis not present

## 2011-04-22 DIAGNOSIS — I10 Essential (primary) hypertension: Secondary | ICD-10-CM | POA: Diagnosis not present

## 2011-05-28 DIAGNOSIS — M4310 Spondylolisthesis, site unspecified: Secondary | ICD-10-CM | POA: Insufficient documentation

## 2011-05-28 DIAGNOSIS — M47817 Spondylosis without myelopathy or radiculopathy, lumbosacral region: Secondary | ICD-10-CM | POA: Diagnosis not present

## 2011-05-28 DIAGNOSIS — IMO0002 Reserved for concepts with insufficient information to code with codable children: Secondary | ICD-10-CM | POA: Diagnosis not present

## 2011-05-28 DIAGNOSIS — M48061 Spinal stenosis, lumbar region without neurogenic claudication: Secondary | ICD-10-CM | POA: Insufficient documentation

## 2011-05-28 DIAGNOSIS — M47816 Spondylosis without myelopathy or radiculopathy, lumbar region: Secondary | ICD-10-CM | POA: Insufficient documentation

## 2011-05-31 DIAGNOSIS — J019 Acute sinusitis, unspecified: Secondary | ICD-10-CM | POA: Diagnosis not present

## 2011-05-31 DIAGNOSIS — H81319 Aural vertigo, unspecified ear: Secondary | ICD-10-CM | POA: Diagnosis not present

## 2011-05-31 DIAGNOSIS — I10 Essential (primary) hypertension: Secondary | ICD-10-CM | POA: Diagnosis not present

## 2011-06-04 DIAGNOSIS — M545 Low back pain, unspecified: Secondary | ICD-10-CM | POA: Diagnosis not present

## 2011-06-04 DIAGNOSIS — M5137 Other intervertebral disc degeneration, lumbosacral region: Secondary | ICD-10-CM | POA: Diagnosis not present

## 2011-06-11 DIAGNOSIS — M5137 Other intervertebral disc degeneration, lumbosacral region: Secondary | ICD-10-CM | POA: Diagnosis not present

## 2011-06-11 DIAGNOSIS — M545 Low back pain, unspecified: Secondary | ICD-10-CM | POA: Diagnosis not present

## 2011-06-21 DIAGNOSIS — R42 Dizziness and giddiness: Secondary | ICD-10-CM | POA: Diagnosis not present

## 2011-06-21 DIAGNOSIS — I10 Essential (primary) hypertension: Secondary | ICD-10-CM | POA: Diagnosis not present

## 2011-06-21 DIAGNOSIS — K219 Gastro-esophageal reflux disease without esophagitis: Secondary | ICD-10-CM | POA: Diagnosis not present

## 2011-07-02 DIAGNOSIS — G4700 Insomnia, unspecified: Secondary | ICD-10-CM | POA: Diagnosis not present

## 2011-07-02 DIAGNOSIS — I10 Essential (primary) hypertension: Secondary | ICD-10-CM | POA: Diagnosis not present

## 2011-07-02 DIAGNOSIS — M549 Dorsalgia, unspecified: Secondary | ICD-10-CM | POA: Diagnosis not present

## 2011-07-02 DIAGNOSIS — Z0000 Encounter for general adult medical examination without abnormal findings: Secondary | ICD-10-CM | POA: Diagnosis not present

## 2011-07-02 DIAGNOSIS — Z6838 Body mass index (BMI) 38.0-38.9, adult: Secondary | ICD-10-CM | POA: Diagnosis not present

## 2011-07-10 DIAGNOSIS — M47817 Spondylosis without myelopathy or radiculopathy, lumbosacral region: Secondary | ICD-10-CM | POA: Diagnosis not present

## 2011-07-10 DIAGNOSIS — M48061 Spinal stenosis, lumbar region without neurogenic claudication: Secondary | ICD-10-CM | POA: Diagnosis not present

## 2011-07-10 DIAGNOSIS — M4310 Spondylolisthesis, site unspecified: Secondary | ICD-10-CM | POA: Diagnosis not present

## 2011-08-12 ENCOUNTER — Other Ambulatory Visit (HOSPITAL_COMMUNITY): Payer: Self-pay | Admitting: *Deleted

## 2011-08-12 ENCOUNTER — Ambulatory Visit (HOSPITAL_COMMUNITY)
Admission: RE | Admit: 2011-08-12 | Discharge: 2011-08-12 | Disposition: A | Discharge status: Home / Self Care | Payer: No Typology Code available for payment source | Source: Ambulatory Visit | Attending: Internal Medicine | Admitting: Internal Medicine

## 2011-08-12 DIAGNOSIS — M545 Low back pain, unspecified: Secondary | ICD-10-CM | POA: Insufficient documentation

## 2011-08-12 DIAGNOSIS — M419 Scoliosis, unspecified: Secondary | ICD-10-CM

## 2011-08-22 DIAGNOSIS — K5730 Diverticulosis of large intestine without perforation or abscess without bleeding: Secondary | ICD-10-CM | POA: Diagnosis not present

## 2011-08-22 DIAGNOSIS — Z6837 Body mass index (BMI) 37.0-37.9, adult: Secondary | ICD-10-CM | POA: Diagnosis not present

## 2011-08-22 DIAGNOSIS — K219 Gastro-esophageal reflux disease without esophagitis: Secondary | ICD-10-CM | POA: Diagnosis not present

## 2011-08-22 DIAGNOSIS — E669 Obesity, unspecified: Secondary | ICD-10-CM | POA: Diagnosis not present

## 2011-08-26 DIAGNOSIS — Z6837 Body mass index (BMI) 37.0-37.9, adult: Secondary | ICD-10-CM | POA: Diagnosis not present

## 2011-08-26 DIAGNOSIS — R3919 Other difficulties with micturition: Secondary | ICD-10-CM | POA: Diagnosis not present

## 2011-08-29 ENCOUNTER — Telehealth: Payer: Self-pay | Admitting: Gastroenterology

## 2011-08-29 NOTE — Telephone Encounter (Signed)
Last OV 02/05/10; Hx of IBS, GERD, Polyps, Diverticulosis. Last COLON 03/20/2008 and recall has been mailed. Pt reports  Stomach pain for several days. He went to his PCP last Thursday and was placed on Cipro and Flagyl that will run out Sunday. Yesterday, he developed a pain in the middle of his back that ran around his waste to the stomach. The pain has eased now, but he doesn't know if he needs to be seen. Today he states his stomach feels really full. He denies diarrhea, fever or melena. Explained to pt if he had diverticulosis , Cipro and Flagyl are the drugs most prescribed. Offered him an appt with a Mid Level next week, but he will complete the meds and call us if he wants an appt. Reminded him of his recall COLON and he will call back to r/s.

## 2011-09-02 ENCOUNTER — Encounter: Payer: Self-pay | Admitting: Nurse Practitioner

## 2011-09-02 ENCOUNTER — Telehealth: Payer: Self-pay | Admitting: Gastroenterology

## 2011-09-02 ENCOUNTER — Ambulatory Visit (INDEPENDENT_AMBULATORY_CARE_PROVIDER_SITE_OTHER): Payer: Medicare Other | Admitting: Nurse Practitioner

## 2011-09-02 ENCOUNTER — Other Ambulatory Visit (INDEPENDENT_AMBULATORY_CARE_PROVIDER_SITE_OTHER): Payer: Medicare Other

## 2011-09-02 VITALS — BP 134/78 | HR 60 | Ht 71.0 in | Wt 265.0 lb

## 2011-09-02 DIAGNOSIS — R141 Gas pain: Secondary | ICD-10-CM | POA: Diagnosis not present

## 2011-09-02 DIAGNOSIS — Z860100 Personal history of colon polyps, unspecified: Secondary | ICD-10-CM

## 2011-09-02 DIAGNOSIS — R143 Flatulence: Secondary | ICD-10-CM

## 2011-09-02 DIAGNOSIS — R1084 Generalized abdominal pain: Secondary | ICD-10-CM

## 2011-09-02 DIAGNOSIS — R142 Eructation: Secondary | ICD-10-CM | POA: Diagnosis not present

## 2011-09-02 DIAGNOSIS — R140 Abdominal distension (gaseous): Secondary | ICD-10-CM

## 2011-09-02 LAB — CBC WITH DIFFERENTIAL/PLATELET
Basophils Absolute: 0.1 10*3/uL (ref 0.0–0.1)
Eosinophils Absolute: 0.3 10*3/uL (ref 0.0–0.7)
HCT: 43.8 % (ref 39.0–52.0)
Lymphocytes Relative: 20.2 % (ref 12.0–46.0)
Lymphs Abs: 2.2 10*3/uL (ref 0.7–4.0)
MCHC: 33.5 g/dL (ref 30.0–36.0)
Monocytes Relative: 8.7 % (ref 3.0–12.0)
Neutro Abs: 7.5 10*3/uL (ref 1.4–7.7)
Platelets: 269 10*3/uL (ref 150.0–400.0)
RDW: 13.4 % (ref 11.5–14.6)

## 2011-09-02 MED ORDER — MOVIPREP 100 G PO SOLR: 1.0000 | Freq: Once | ORAL | Status: AC

## 2011-09-02 MED ORDER — HYOSCYAMINE SULFATE ER 0.375 MG PO TB12: 0.3750 mg | ORAL_TABLET | Freq: Two times a day (BID) | ORAL | Status: DC | PRN

## 2011-09-02 NOTE — Progress Notes (Signed)
09/02/2011 Alexander Nunez [id number] 1945-05-01   HISTORY OF PRESENT ILLNESS: Patient is a 66 year old male known to Dr. Russella Dar for history of irritable bowel syndrome , adenomatous colon polyp, gastritis (EGD Feb 2005) and GERD. His IBS has been managed with Levbid in the past. Patient was due for surveillance colonoscopy February 2013, we mailed him a letter.  Patient saw PCP 10 days ago for left lower quadrant abdominal pain. He was given Cipro / Flagyl, presumably for diverticulitis. His pain resolved but recurred over the weekend. The abdominal pain is more generalized now. He feels gassy, bloated. Belching helps the pain . He has been taking a probiotic for several days. He is on chronic PPI treatment. BMs are at baseline. No fevers. Per patient, recent urinalysis was negative   Past Medical History  Diagnosis Date  . High cholesterol   . Hypertension   . Heart palpitations   . GERD (gastroesophageal reflux disease)   . Anxiety   . Colon polyp   . Irritable bowel syndrome    Past Surgical History  Procedure Date  . Foot fracture surgery     reports that he has quit smoking. He has never used smokeless tobacco. He reports that he drinks alcohol. He reports that he does not use illicit drugs. family history is negative for Colon cancer. Allergies  Allergen Reactions  . Cephalexin     REACTION: fever      Outpatient Encounter Prescriptions as of 09/02/2011  Medication Sig Dispense Refill  . ALPRAZolam (XANAX) 0.5 MG tablet Take 0.5 mg by mouth at bedtime as needed.        . doxazosin (CARDURA) 4 MG tablet Take 4 mg by mouth at bedtime.        Marland Kitchen FLUoxetine (PROZAC) 40 MG capsule Take 40 mg by mouth daily.        . folic acid (FOLVITE) 1 MG tablet Take 1 mg by mouth daily.        . metoprolol (TOPROL-XL) 50 MG 24 hr tablet Take 50 mg by mouth 2 (two) times daily.        . pantoprazole (PROTONIX) 40 MG tablet Take 40 mg by mouth daily.       . pravastatin (PRAVACHOL) 20 MG tablet  Take 20 mg by mouth daily.        . Probiotic Product (PROBIOTIC PO) Take 1 capsule by mouth daily.      . methotrexate (RHEUMATREX) 2.5 MG tablet Take 2.5 mg by mouth as needed. Take 3 tab weekly Caution:Chemotherapy. Protect from light.         REVIEW OF SYSTEMS  : All other systems reviewed and negative except where noted in the History of Present Illness.   PHYSICAL EXAM: BP 134/78  Pulse 60  Ht 5\' 11"  (1.803 m)  Wt 265 lb (120.203 kg)  BMI 36.96 kg/m2 General: pleasant obese, white male in no acute distress Head: Normocephalic and atraumatic Eyes:  sclerae anicteric,conjunctive pink. Ears: Normal auditory acuity Mouth: No deformity or lesions Neck: Supple, no masses.  Lungs: Clear throughout to auscultation Heart: Regular rate and rhythm; no murmurs heard Abdomen: Soft, non distended, nontender. No masses or hepatomegaly noted. Normal Bowel sounds Rectal: not done Musculoskeletal: Symmetrical with no gross deformities  Skin: No lesions on visible extremities Extremities: No edema or deformities noted Neurological: Alert oriented x 4, grossly nonfocal Cervical Nodes:  No significant cervical adenopathy Psychological:  Alert and cooperative. Normal mood and affect  ASSESSMENT AND PLAN:  1. Abdominal pain. Initially patient had LLQ pain which resolved after course of Cipro and Flagyl. Now with recurrent abdominal pain that is really more generalized and associated with gas and bloating. Patient feels better after belching. Patient might of had diverticulitis several days ago, his abdominal exam does suggest any acute processes now. Will check a CBC. Current bloating and gas may be from recent antibiotics and / or patient's known history of IBS.  Trial of Gas-X , Levbid BID, continue Probiotic. Patient will call if symptoms don't improve, or if he develops fevers and / or worsening pain.   2. History of adenomatous colon polyps, surveillance colonoscopy was due Feb 2013. Patient  would like to proceed. The risks, benefits, and alternatives to colonoscopy with possible biopsy and possible polypectomy were discussed with the patient and they consent to proceed.

## 2011-09-02 NOTE — Telephone Encounter (Signed)
Patient reports abdominal pain. The pain is not the same as it was when he was treated for diverticulitis.  The pain is upper abdominal pain and pressure.  He will come in today and see Willette Cluster RNP at 3:00

## 2011-09-02 NOTE — Patient Instructions (Addendum)
Please go to the basement level to have your labs drawn.   We sent a prescription for the colonoscopy prep, Moviiprep, to [facility], [place]. We also sent one for the Levbid, antispasmodic. We have given you a $20 rebate coupon.  Fill it out and send receipt to the address on the coupon. You will receive a $20 coupon.  Call us if you experience  recurrent lower abdominal pain or fever

## 2011-09-04 NOTE — Progress Notes (Signed)
Agree with initial assessment and plans as outlined 

## 2011-09-06 DIAGNOSIS — F411 Generalized anxiety disorder: Secondary | ICD-10-CM | POA: Diagnosis not present

## 2011-09-06 DIAGNOSIS — Z6836 Body mass index (BMI) 36.0-36.9, adult: Secondary | ICD-10-CM | POA: Diagnosis not present

## 2011-09-06 DIAGNOSIS — R109 Unspecified abdominal pain: Secondary | ICD-10-CM | POA: Diagnosis not present

## 2011-09-06 DIAGNOSIS — L259 Unspecified contact dermatitis, unspecified cause: Secondary | ICD-10-CM | POA: Diagnosis not present

## 2011-09-08 ENCOUNTER — Other Ambulatory Visit (HOSPITAL_COMMUNITY): Payer: Self-pay | Admitting: Family Medicine

## 2011-09-08 DIAGNOSIS — R109 Unspecified abdominal pain: Secondary | ICD-10-CM

## 2011-09-10 ENCOUNTER — Ambulatory Visit (HOSPITAL_COMMUNITY)
Admission: RE | Admit: 2011-09-10 | Discharge: 2011-09-10 | Disposition: A | Discharge status: Home / Self Care | Payer: Medicare Other | Source: Ambulatory Visit | Attending: Family Medicine | Admitting: Family Medicine

## 2011-09-10 DIAGNOSIS — R935 Abnormal findings on diagnostic imaging of other abdominal regions, including retroperitoneum: Secondary | ICD-10-CM | POA: Diagnosis not present

## 2011-09-10 DIAGNOSIS — K7689 Other specified diseases of liver: Secondary | ICD-10-CM | POA: Diagnosis not present

## 2011-09-10 DIAGNOSIS — R109 Unspecified abdominal pain: Secondary | ICD-10-CM

## 2011-09-11 ENCOUNTER — Other Ambulatory Visit (HOSPITAL_COMMUNITY): Payer: Self-pay | Admitting: Family Medicine

## 2011-09-11 DIAGNOSIS — R109 Unspecified abdominal pain: Secondary | ICD-10-CM

## 2011-09-17 ENCOUNTER — Encounter (HOSPITAL_COMMUNITY): Payer: Self-pay

## 2011-09-17 ENCOUNTER — Encounter (HOSPITAL_COMMUNITY)
Admission: RE | Admit: 2011-09-17 | Discharge: 2011-09-17 | Disposition: A | Discharge status: Home / Self Care | Payer: Medicare Other | Source: Ambulatory Visit | Attending: Family Medicine | Admitting: Family Medicine

## 2011-09-17 DIAGNOSIS — R109 Unspecified abdominal pain: Secondary | ICD-10-CM

## 2011-09-17 MED ORDER — TECHNETIUM TC 99M MEBROFENIN IV KIT
5.0000 | PACK | Freq: Once | INTRAVENOUS | Status: AC | PRN
  Administered 2011-09-17: 5 via INTRAVENOUS

## 2011-09-18 ENCOUNTER — Telehealth: Payer: Self-pay | Admitting: *Deleted

## 2011-09-18 MED ORDER — HYOSCYAMINE SULFATE ER 0.375 MG PO TB12: 0.3750 mg | ORAL_TABLET | Freq: Two times a day (BID) | ORAL | Status: DC | PRN

## 2011-09-18 NOTE — Telephone Encounter (Signed)
Sent prescription electronically for Levbid 0.375 mg. Received fax from [facility].

## 2011-10-07 ENCOUNTER — Encounter: Payer: Self-pay | Admitting: Gastroenterology

## 2011-10-07 ENCOUNTER — Ambulatory Visit (AMBULATORY_SURGERY_CENTER): Payer: Medicare Other | Admitting: Gastroenterology

## 2011-10-07 VITALS — BP 131/60 | HR 62 | Temp 97.7°F | Resp 17 | Ht 71.0 in | Wt 265.0 lb

## 2011-10-07 DIAGNOSIS — Z1211 Encounter for screening for malignant neoplasm of colon: Secondary | ICD-10-CM

## 2011-10-07 DIAGNOSIS — Z860100 Personal history of colon polyps, unspecified: Secondary | ICD-10-CM

## 2011-10-07 DIAGNOSIS — R143 Flatulence: Secondary | ICD-10-CM

## 2011-10-07 DIAGNOSIS — K219 Gastro-esophageal reflux disease without esophagitis: Secondary | ICD-10-CM | POA: Diagnosis not present

## 2011-10-07 DIAGNOSIS — R109 Unspecified abdominal pain: Secondary | ICD-10-CM | POA: Diagnosis not present

## 2011-10-07 DIAGNOSIS — D126 Benign neoplasm of colon, unspecified: Secondary | ICD-10-CM

## 2011-10-07 DIAGNOSIS — R1084 Generalized abdominal pain: Secondary | ICD-10-CM

## 2011-10-07 DIAGNOSIS — R141 Gas pain: Secondary | ICD-10-CM

## 2011-10-07 MED ORDER — SODIUM CHLORIDE 0.9 % IV SOLN: 500.0000 mL | INTRAVENOUS | Status: DC

## 2011-10-07 MED ORDER — PANTOPRAZOLE SODIUM 40 MG PO TBEC: 40.0000 mg | DELAYED_RELEASE_TABLET | Freq: Two times a day (BID) | ORAL | Status: DC

## 2011-10-07 NOTE — Op Note (Addendum)
[facility] [address] [place], [address]   COLONOSCOPY PROCEDURE REPORT  PATIENT: Alexander Nunez, Alexander Nunez  MR#: [id number] BIRTHDATE: [birth year] , 66  yrs. old GENDER: Male ENDOSCOPIST: Meryl Dare, MD, [facility]  PROCEDURE DATE:  10/07/2011 PROCEDURE:   Colonoscopy with snare polypectomy ASA CLASS:   Class II INDICATIONS: patient's personal history of adenomatous colon polyps: TVA, 03/2008 MEDICATIONS: MAC sedation, administered by CRNA and propofol (Diprivan) 120mg  IV DESCRIPTION OF PROCEDURE:   After the risks benefits and alternatives of the procedure were thoroughly explained, informed consent was obtained.  A digital rectal exam revealed no abnormalities of the rectum.   The LB [id number]  endoscope was introduced through the anus and advanced to the cecum, which was identified by both the appendix and ileocecal valve. No adverse events experienced.   The quality of the prep was good, using MoviPrep  The instrument was then slowly withdrawn as the colon was fully examined.   COLON FINDINGS: Moderate diverticulosis was noted in the sigmoid colon and descending colon.   A sessile polyp was found in the sigmoid colon.  A polypectomy was performed with a cold snare.  The resection was complete and the polyp tissue was completely retrieved. The colon was otherwise normal.  There was no diverticulosis, inflamation, polyps or cancers unless previously stated.  Retroflexed views revealed internal hemorrhoids. The time to cecum=1 minutes 33 seconds.  Withdrawal time=8 minutes 13 seconds.  The scope was withdrawn and the procedure completed. COMPLICATIONS: There were no complications.  ENDOSCOPIC IMPRESSION: 1.   Moderate diverticulosis in the sigmoid colon and descending colon 2.   Sessile polyp in the sigmoid colon; polypectomy performed with cold snare 3.   Internal hemorrhoids  RECOMMENDATIONS: 1.  await pathology results 2.  High fiber diet with  liberal fluid intake. 3.  repeat Colonoscopy in 5 years.  eSigned:  Meryl Dare, MD, [facility] 10/07/2011 2:51 PM Revised: 10/07/2011 2:51 PM  cc: Karleen Hampshire, MD

## 2011-10-07 NOTE — Patient Instructions (Signed)
YOU HAD AN ENDOSCOPIC PROCEDURE TODAY AT THE [facility]: Refer to the procedure report that was given to you for any specific questions about what was found during the examination.  If the procedure report does not answer your questions, please call your gastroenterologist to clarify.  If you requested that your care partner not be given the details of your procedure findings, then the procedure report has been included in a sealed envelope for you to review at your convenience later.  YOU SHOULD EXPECT: Some feelings of bloating in the abdomen. Passage of more gas than usual.  Walking can help get rid of the air that was put into your GI tract during the procedure and reduce the bloating. If you had a lower endoscopy (such as a colonoscopy or flexible sigmoidoscopy) you may notice spotting of blood in your stool or on the toilet paper. If you underwent a bowel prep for your procedure, then you may not have a normal bowel movement for a few days.  DIET: Your first meal following the procedure should be a light meal and then it is ok to progress to your normal diet.  A half-sandwich or bowl of soup is an example of a good first meal.  Heavy or fried foods are harder to digest and may make you feel nauseous or bloated.  Likewise meals heavy in dairy and vegetables can cause extra gas to form and this can also increase the bloating.  Drink plenty of fluids but you should avoid alcoholic beverages for 24 hours.  ACTIVITY: Your care partner should take you home directly after the procedure.  You should plan to take it easy, moving slowly for the rest of the day.  You can resume normal activity the day after the procedure however you should NOT DRIVE or use heavy machinery for 24 hours (because of the sedation medicines used during the test).    SYMPTOMS TO REPORT IMMEDIATELY: A gastroenterologist can be reached at any hour.  During normal business hours, 8:30 AM to 5:00 PM Monday through Friday,  call [phone].  After hours and on weekends, please call the GI answering service at [phone] who will take a message and have the physician on call contact you.   Following lower endoscopy (colonoscopy or flexible sigmoidoscopy):  Excessive amounts of blood in the stool  Significant tenderness or worsening of abdominal pains  Swelling of the abdomen that is new, acute  Fever of 100F or higher   FOLLOW UP: If any biopsies were taken you will be contacted by phone or by letter within the next 1-3 weeks.  Call your gastroenterologist if you have not heard about the biopsies in 3 weeks.  Our staff will call the home number listed on your records the next business day following your procedure to check on you and address any questions or concerns that you may have at that time regarding the information given to you following your procedure. This is a courtesy call and so if there is no answer at the home number and we have not heard from you through the emergency physician on call, we will assume that you have returned to your regular daily activities without incident.  SIGNATURES/CONFIDENTIALITY: You and/or your care partner have signed paperwork which will be entered into your electronic medical record.  These signatures attest to the fact that that the information above on your After Visit Summary has been reviewed and is understood.  Full responsibility of the confidentiality of   this discharge information lies with you and/or your care-partner.     PROTONIX 40MG  TWICE DAILY SENT TO [facility]. [employer]  I NFORMATION ON POLYPS,DIVERTICULOSIS,HEMORRHOIDS & HIGH FIBER DIET GIVEN TO YOU TODAY.

## 2011-10-07 NOTE — Progress Notes (Signed)
Patient did not have preoperative order for IV antibiotic SSI prophylaxis. (G8918)  Patient did not experience any of the following events: a burn prior to discharge; a fall within the facility; wrong site/side/patient/procedure/implant event; or a hospital transfer or hospital admission upon discharge from the facility. (G8907)  

## 2011-10-08 ENCOUNTER — Telehealth: Payer: Self-pay | Admitting: *Deleted

## 2011-10-08 NOTE — Telephone Encounter (Signed)
  Follow up Call-  Call back number 10/07/2011  Post procedure Call Back phone  # [phone]  Permission to leave phone message Yes     Patient questions:  Do you have a fever, pain , or abdominal swelling? no Pain Score  0 *  Have you tolerated food without any problems? yes  Have you been able to return to your normal activities? yes  Do you have any questions about your discharge instructions: Diet   no Medications  no Follow up visit  no  Do you have questions or concerns about your Care? no  Actions: * If pain score is 4 or above: No action needed, pain <4.

## 2011-10-13 ENCOUNTER — Encounter: Payer: Self-pay | Admitting: Gastroenterology

## 2011-10-20 DIAGNOSIS — M47817 Spondylosis without myelopathy or radiculopathy, lumbosacral region: Secondary | ICD-10-CM | POA: Diagnosis not present

## 2011-10-23 DIAGNOSIS — B356 Tinea cruris: Secondary | ICD-10-CM | POA: Diagnosis not present

## 2011-10-23 DIAGNOSIS — I10 Essential (primary) hypertension: Secondary | ICD-10-CM | POA: Diagnosis not present

## 2011-10-23 DIAGNOSIS — Z6837 Body mass index (BMI) 37.0-37.9, adult: Secondary | ICD-10-CM | POA: Diagnosis not present

## 2011-11-03 DIAGNOSIS — M5380 Other specified dorsopathies, site unspecified: Secondary | ICD-10-CM | POA: Diagnosis not present

## 2011-11-03 DIAGNOSIS — M47817 Spondylosis without myelopathy or radiculopathy, lumbosacral region: Secondary | ICD-10-CM | POA: Diagnosis not present

## 2011-11-03 DIAGNOSIS — M5137 Other intervertebral disc degeneration, lumbosacral region: Secondary | ICD-10-CM | POA: Diagnosis not present

## 2011-11-11 DIAGNOSIS — M5137 Other intervertebral disc degeneration, lumbosacral region: Secondary | ICD-10-CM | POA: Diagnosis not present

## 2011-11-11 DIAGNOSIS — K449 Diaphragmatic hernia without obstruction or gangrene: Secondary | ICD-10-CM | POA: Diagnosis not present

## 2011-11-11 DIAGNOSIS — I10 Essential (primary) hypertension: Secondary | ICD-10-CM | POA: Diagnosis not present

## 2011-11-11 DIAGNOSIS — K219 Gastro-esophageal reflux disease without esophagitis: Secondary | ICD-10-CM | POA: Diagnosis not present

## 2011-11-11 DIAGNOSIS — E785 Hyperlipidemia, unspecified: Secondary | ICD-10-CM | POA: Diagnosis not present

## 2011-11-11 DIAGNOSIS — L408 Other psoriasis: Secondary | ICD-10-CM | POA: Diagnosis not present

## 2011-11-11 DIAGNOSIS — Z841 Family history of disorders of kidney and ureter: Secondary | ICD-10-CM | POA: Diagnosis not present

## 2011-11-11 DIAGNOSIS — Z8052 Family history of malignant neoplasm of bladder: Secondary | ICD-10-CM | POA: Diagnosis not present

## 2011-11-11 DIAGNOSIS — F411 Generalized anxiety disorder: Secondary | ICD-10-CM | POA: Diagnosis not present

## 2011-11-11 DIAGNOSIS — Z883 Allergy status to other anti-infective agents status: Secondary | ICD-10-CM | POA: Diagnosis not present

## 2011-11-11 DIAGNOSIS — M5380 Other specified dorsopathies, site unspecified: Secondary | ICD-10-CM | POA: Diagnosis not present

## 2011-12-03 DIAGNOSIS — M5380 Other specified dorsopathies, site unspecified: Secondary | ICD-10-CM | POA: Diagnosis not present

## 2011-12-03 DIAGNOSIS — M5137 Other intervertebral disc degeneration, lumbosacral region: Secondary | ICD-10-CM | POA: Diagnosis not present

## 2011-12-03 DIAGNOSIS — M47817 Spondylosis without myelopathy or radiculopathy, lumbosacral region: Secondary | ICD-10-CM | POA: Diagnosis not present

## 2011-12-03 DIAGNOSIS — E669 Obesity, unspecified: Secondary | ICD-10-CM | POA: Diagnosis not present

## 2011-12-10 DIAGNOSIS — M5137 Other intervertebral disc degeneration, lumbosacral region: Secondary | ICD-10-CM | POA: Diagnosis not present

## 2011-12-10 DIAGNOSIS — M47817 Spondylosis without myelopathy or radiculopathy, lumbosacral region: Secondary | ICD-10-CM | POA: Diagnosis not present

## 2011-12-10 DIAGNOSIS — M5380 Other specified dorsopathies, site unspecified: Secondary | ICD-10-CM | POA: Diagnosis not present

## 2011-12-10 DIAGNOSIS — E669 Obesity, unspecified: Secondary | ICD-10-CM | POA: Diagnosis not present

## 2011-12-25 DIAGNOSIS — Z79899 Other long term (current) drug therapy: Secondary | ICD-10-CM | POA: Diagnosis not present

## 2011-12-25 DIAGNOSIS — M5137 Other intervertebral disc degeneration, lumbosacral region: Secondary | ICD-10-CM | POA: Diagnosis not present

## 2011-12-25 DIAGNOSIS — I10 Essential (primary) hypertension: Secondary | ICD-10-CM | POA: Diagnosis not present

## 2011-12-25 DIAGNOSIS — E785 Hyperlipidemia, unspecified: Secondary | ICD-10-CM | POA: Diagnosis not present

## 2011-12-25 DIAGNOSIS — F411 Generalized anxiety disorder: Secondary | ICD-10-CM | POA: Diagnosis not present

## 2011-12-25 DIAGNOSIS — Z809 Family history of malignant neoplasm, unspecified: Secondary | ICD-10-CM | POA: Diagnosis not present

## 2011-12-25 DIAGNOSIS — K219 Gastro-esophageal reflux disease without esophagitis: Secondary | ICD-10-CM | POA: Diagnosis not present

## 2011-12-25 DIAGNOSIS — M51379 Other intervertebral disc degeneration, lumbosacral region without mention of lumbar back pain or lower extremity pain: Secondary | ICD-10-CM | POA: Diagnosis not present

## 2011-12-25 DIAGNOSIS — G8929 Other chronic pain: Secondary | ICD-10-CM | POA: Diagnosis not present

## 2011-12-25 DIAGNOSIS — K449 Diaphragmatic hernia without obstruction or gangrene: Secondary | ICD-10-CM | POA: Diagnosis not present

## 2011-12-25 DIAGNOSIS — L408 Other psoriasis: Secondary | ICD-10-CM | POA: Diagnosis not present

## 2011-12-25 DIAGNOSIS — Z841 Family history of disorders of kidney and ureter: Secondary | ICD-10-CM | POA: Diagnosis not present

## 2011-12-25 DIAGNOSIS — M545 Low back pain, unspecified: Secondary | ICD-10-CM | POA: Diagnosis not present

## 2011-12-25 DIAGNOSIS — Z6830 Body mass index (BMI) 30.0-30.9, adult: Secondary | ICD-10-CM | POA: Diagnosis not present

## 2011-12-25 DIAGNOSIS — G4733 Obstructive sleep apnea (adult) (pediatric): Secondary | ICD-10-CM | POA: Diagnosis not present

## 2011-12-25 DIAGNOSIS — Z883 Allergy status to other anti-infective agents status: Secondary | ICD-10-CM | POA: Diagnosis not present

## 2011-12-25 DIAGNOSIS — Z8052 Family history of malignant neoplasm of bladder: Secondary | ICD-10-CM | POA: Diagnosis not present

## 2011-12-25 DIAGNOSIS — E669 Obesity, unspecified: Secondary | ICD-10-CM | POA: Diagnosis not present

## 2011-12-25 DIAGNOSIS — M47817 Spondylosis without myelopathy or radiculopathy, lumbosacral region: Secondary | ICD-10-CM | POA: Diagnosis not present

## 2012-01-12 DIAGNOSIS — M5137 Other intervertebral disc degeneration, lumbosacral region: Secondary | ICD-10-CM | POA: Diagnosis not present

## 2012-01-12 DIAGNOSIS — E669 Obesity, unspecified: Secondary | ICD-10-CM | POA: Diagnosis not present

## 2012-01-12 DIAGNOSIS — M47817 Spondylosis without myelopathy or radiculopathy, lumbosacral region: Secondary | ICD-10-CM | POA: Diagnosis not present

## 2012-01-12 DIAGNOSIS — M5380 Other specified dorsopathies, site unspecified: Secondary | ICD-10-CM | POA: Diagnosis not present

## 2012-01-15 DIAGNOSIS — I10 Essential (primary) hypertension: Secondary | ICD-10-CM | POA: Diagnosis not present

## 2012-01-15 DIAGNOSIS — Z23 Encounter for immunization: Secondary | ICD-10-CM | POA: Diagnosis not present

## 2012-01-15 DIAGNOSIS — R070 Pain in throat: Secondary | ICD-10-CM | POA: Diagnosis not present

## 2012-01-15 DIAGNOSIS — F411 Generalized anxiety disorder: Secondary | ICD-10-CM | POA: Diagnosis not present

## 2012-01-15 DIAGNOSIS — Z6837 Body mass index (BMI) 37.0-37.9, adult: Secondary | ICD-10-CM | POA: Diagnosis not present

## 2012-01-16 DIAGNOSIS — G8929 Other chronic pain: Secondary | ICD-10-CM | POA: Diagnosis not present

## 2012-01-16 DIAGNOSIS — F411 Generalized anxiety disorder: Secondary | ICD-10-CM | POA: Diagnosis not present

## 2012-01-16 DIAGNOSIS — E669 Obesity, unspecified: Secondary | ICD-10-CM | POA: Diagnosis not present

## 2012-01-16 DIAGNOSIS — Z6838 Body mass index (BMI) 38.0-38.9, adult: Secondary | ICD-10-CM | POA: Diagnosis not present

## 2012-01-16 DIAGNOSIS — K449 Diaphragmatic hernia without obstruction or gangrene: Secondary | ICD-10-CM | POA: Diagnosis not present

## 2012-01-16 DIAGNOSIS — M5380 Other specified dorsopathies, site unspecified: Secondary | ICD-10-CM | POA: Diagnosis not present

## 2012-01-16 DIAGNOSIS — G4733 Obstructive sleep apnea (adult) (pediatric): Secondary | ICD-10-CM | POA: Diagnosis not present

## 2012-01-16 DIAGNOSIS — I10 Essential (primary) hypertension: Secondary | ICD-10-CM | POA: Diagnosis not present

## 2012-01-16 DIAGNOSIS — L408 Other psoriasis: Secondary | ICD-10-CM | POA: Diagnosis not present

## 2012-01-16 DIAGNOSIS — Z883 Allergy status to other anti-infective agents status: Secondary | ICD-10-CM | POA: Diagnosis not present

## 2012-01-16 DIAGNOSIS — E785 Hyperlipidemia, unspecified: Secondary | ICD-10-CM | POA: Diagnosis not present

## 2012-01-16 DIAGNOSIS — M5137 Other intervertebral disc degeneration, lumbosacral region: Secondary | ICD-10-CM | POA: Diagnosis not present

## 2012-01-16 DIAGNOSIS — K219 Gastro-esophageal reflux disease without esophagitis: Secondary | ICD-10-CM | POA: Diagnosis not present

## 2012-01-16 DIAGNOSIS — N059 Unspecified nephritic syndrome with unspecified morphologic changes: Secondary | ICD-10-CM | POA: Diagnosis not present

## 2012-01-19 ENCOUNTER — Other Ambulatory Visit (HOSPITAL_COMMUNITY): Payer: Self-pay | Admitting: Family Medicine

## 2012-01-19 ENCOUNTER — Ambulatory Visit (HOSPITAL_COMMUNITY)
Admission: RE | Admit: 2012-01-19 | Discharge: 2012-01-19 | Disposition: A | Discharge status: Home / Self Care | Payer: Medicare Other | Source: Ambulatory Visit | Attending: Family Medicine | Admitting: Family Medicine

## 2012-01-19 DIAGNOSIS — R0602 Shortness of breath: Secondary | ICD-10-CM

## 2012-01-19 DIAGNOSIS — R079 Chest pain, unspecified: Secondary | ICD-10-CM | POA: Diagnosis not present

## 2012-01-19 DIAGNOSIS — R0789 Other chest pain: Secondary | ICD-10-CM | POA: Diagnosis not present

## 2012-01-19 DIAGNOSIS — J209 Acute bronchitis, unspecified: Secondary | ICD-10-CM | POA: Diagnosis not present

## 2012-02-14 DIAGNOSIS — K5732 Diverticulitis of large intestine without perforation or abscess without bleeding: Secondary | ICD-10-CM | POA: Diagnosis not present

## 2012-02-14 DIAGNOSIS — Z6837 Body mass index (BMI) 37.0-37.9, adult: Secondary | ICD-10-CM | POA: Diagnosis not present

## 2012-02-20 DIAGNOSIS — R21 Rash and other nonspecific skin eruption: Secondary | ICD-10-CM | POA: Diagnosis not present

## 2012-02-20 DIAGNOSIS — L509 Urticaria, unspecified: Secondary | ICD-10-CM | POA: Diagnosis not present

## 2012-02-23 DIAGNOSIS — M5380 Other specified dorsopathies, site unspecified: Secondary | ICD-10-CM | POA: Diagnosis not present

## 2012-02-23 DIAGNOSIS — M5137 Other intervertebral disc degeneration, lumbosacral region: Secondary | ICD-10-CM | POA: Diagnosis not present

## 2012-02-23 DIAGNOSIS — M47817 Spondylosis without myelopathy or radiculopathy, lumbosacral region: Secondary | ICD-10-CM | POA: Diagnosis not present

## 2012-02-24 DIAGNOSIS — Z09 Encounter for follow-up examination after completed treatment for conditions other than malignant neoplasm: Secondary | ICD-10-CM | POA: Diagnosis not present

## 2012-02-24 DIAGNOSIS — Z79899 Other long term (current) drug therapy: Secondary | ICD-10-CM | POA: Diagnosis not present

## 2012-02-24 DIAGNOSIS — B359 Dermatophytosis, unspecified: Secondary | ICD-10-CM | POA: Diagnosis not present

## 2012-02-24 DIAGNOSIS — L408 Other psoriasis: Secondary | ICD-10-CM | POA: Diagnosis not present

## 2012-02-24 DIAGNOSIS — Z111 Encounter for screening for respiratory tuberculosis: Secondary | ICD-10-CM | POA: Diagnosis not present

## 2012-02-27 DIAGNOSIS — M47817 Spondylosis without myelopathy or radiculopathy, lumbosacral region: Secondary | ICD-10-CM | POA: Diagnosis not present

## 2012-02-27 DIAGNOSIS — Z8059 Family history of malignant neoplasm of other urinary tract organ: Secondary | ICD-10-CM | POA: Diagnosis not present

## 2012-02-27 DIAGNOSIS — IMO0002 Reserved for concepts with insufficient information to code with codable children: Secondary | ICD-10-CM | POA: Diagnosis not present

## 2012-02-27 DIAGNOSIS — Z841 Family history of disorders of kidney and ureter: Secondary | ICD-10-CM | POA: Diagnosis not present

## 2012-02-27 DIAGNOSIS — F411 Generalized anxiety disorder: Secondary | ICD-10-CM | POA: Diagnosis not present

## 2012-02-27 DIAGNOSIS — Z79899 Other long term (current) drug therapy: Secondary | ICD-10-CM | POA: Diagnosis not present

## 2012-02-27 DIAGNOSIS — L408 Other psoriasis: Secondary | ICD-10-CM | POA: Diagnosis not present

## 2012-02-27 DIAGNOSIS — I10 Essential (primary) hypertension: Secondary | ICD-10-CM | POA: Diagnosis not present

## 2012-02-27 DIAGNOSIS — G4733 Obstructive sleep apnea (adult) (pediatric): Secondary | ICD-10-CM | POA: Diagnosis not present

## 2012-02-27 DIAGNOSIS — Z6838 Body mass index (BMI) 38.0-38.9, adult: Secondary | ICD-10-CM | POA: Diagnosis not present

## 2012-02-27 DIAGNOSIS — E785 Hyperlipidemia, unspecified: Secondary | ICD-10-CM | POA: Diagnosis not present

## 2012-02-27 DIAGNOSIS — M5380 Other specified dorsopathies, site unspecified: Secondary | ICD-10-CM | POA: Diagnosis not present

## 2012-02-27 DIAGNOSIS — Z883 Allergy status to other anti-infective agents status: Secondary | ICD-10-CM | POA: Diagnosis not present

## 2012-02-27 DIAGNOSIS — K219 Gastro-esophageal reflux disease without esophagitis: Secondary | ICD-10-CM | POA: Diagnosis not present

## 2012-02-27 DIAGNOSIS — E669 Obesity, unspecified: Secondary | ICD-10-CM | POA: Diagnosis not present

## 2012-02-27 DIAGNOSIS — K449 Diaphragmatic hernia without obstruction or gangrene: Secondary | ICD-10-CM | POA: Diagnosis not present

## 2012-04-07 DIAGNOSIS — M47817 Spondylosis without myelopathy or radiculopathy, lumbosacral region: Secondary | ICD-10-CM | POA: Diagnosis not present

## 2012-04-07 DIAGNOSIS — M5137 Other intervertebral disc degeneration, lumbosacral region: Secondary | ICD-10-CM | POA: Diagnosis not present

## 2012-04-07 DIAGNOSIS — M5380 Other specified dorsopathies, site unspecified: Secondary | ICD-10-CM | POA: Diagnosis not present

## 2012-04-07 DIAGNOSIS — E669 Obesity, unspecified: Secondary | ICD-10-CM | POA: Diagnosis not present

## 2012-04-16 DIAGNOSIS — M5137 Other intervertebral disc degeneration, lumbosacral region: Secondary | ICD-10-CM | POA: Diagnosis not present

## 2012-04-16 DIAGNOSIS — M47817 Spondylosis without myelopathy or radiculopathy, lumbosacral region: Secondary | ICD-10-CM | POA: Diagnosis not present

## 2012-04-16 DIAGNOSIS — M5380 Other specified dorsopathies, site unspecified: Secondary | ICD-10-CM | POA: Diagnosis not present

## 2012-04-22 DIAGNOSIS — I10 Essential (primary) hypertension: Secondary | ICD-10-CM | POA: Diagnosis not present

## 2012-04-22 DIAGNOSIS — Z23 Encounter for immunization: Secondary | ICD-10-CM | POA: Diagnosis not present

## 2012-04-22 DIAGNOSIS — E785 Hyperlipidemia, unspecified: Secondary | ICD-10-CM | POA: Diagnosis not present

## 2012-04-22 DIAGNOSIS — N419 Inflammatory disease of prostate, unspecified: Secondary | ICD-10-CM | POA: Diagnosis not present

## 2012-04-22 DIAGNOSIS — Z6837 Body mass index (BMI) 37.0-37.9, adult: Secondary | ICD-10-CM | POA: Diagnosis not present

## 2012-04-23 DIAGNOSIS — M47817 Spondylosis without myelopathy or radiculopathy, lumbosacral region: Secondary | ICD-10-CM | POA: Diagnosis not present

## 2012-04-23 DIAGNOSIS — M48061 Spinal stenosis, lumbar region without neurogenic claudication: Secondary | ICD-10-CM | POA: Diagnosis not present

## 2012-04-23 DIAGNOSIS — N419 Inflammatory disease of prostate, unspecified: Secondary | ICD-10-CM | POA: Diagnosis not present

## 2012-04-23 DIAGNOSIS — M5137 Other intervertebral disc degeneration, lumbosacral region: Secondary | ICD-10-CM | POA: Diagnosis not present

## 2012-04-23 DIAGNOSIS — E785 Hyperlipidemia, unspecified: Secondary | ICD-10-CM | POA: Diagnosis not present

## 2012-04-23 DIAGNOSIS — E669 Obesity, unspecified: Secondary | ICD-10-CM | POA: Diagnosis not present

## 2012-04-23 DIAGNOSIS — I10 Essential (primary) hypertension: Secondary | ICD-10-CM | POA: Diagnosis not present

## 2012-04-23 DIAGNOSIS — M4310 Spondylolisthesis, site unspecified: Secondary | ICD-10-CM | POA: Diagnosis not present

## 2012-05-05 DIAGNOSIS — M47817 Spondylosis without myelopathy or radiculopathy, lumbosacral region: Secondary | ICD-10-CM | POA: Diagnosis not present

## 2012-05-05 DIAGNOSIS — M5137 Other intervertebral disc degeneration, lumbosacral region: Secondary | ICD-10-CM | POA: Diagnosis not present

## 2012-05-05 DIAGNOSIS — M5380 Other specified dorsopathies, site unspecified: Secondary | ICD-10-CM | POA: Diagnosis not present

## 2012-05-06 DIAGNOSIS — Z6838 Body mass index (BMI) 38.0-38.9, adult: Secondary | ICD-10-CM | POA: Diagnosis not present

## 2012-05-06 DIAGNOSIS — K5900 Constipation, unspecified: Secondary | ICD-10-CM | POA: Diagnosis not present

## 2012-05-06 DIAGNOSIS — K589 Irritable bowel syndrome without diarrhea: Secondary | ICD-10-CM | POA: Diagnosis not present

## 2012-05-06 DIAGNOSIS — R109 Unspecified abdominal pain: Secondary | ICD-10-CM | POA: Diagnosis not present

## 2012-05-26 DIAGNOSIS — Z7182 Exercise counseling: Secondary | ICD-10-CM | POA: Diagnosis not present

## 2012-05-26 DIAGNOSIS — Z6838 Body mass index (BMI) 38.0-38.9, adult: Secondary | ICD-10-CM | POA: Diagnosis not present

## 2012-05-26 DIAGNOSIS — B354 Tinea corporis: Secondary | ICD-10-CM | POA: Diagnosis not present

## 2012-05-26 DIAGNOSIS — R21 Rash and other nonspecific skin eruption: Secondary | ICD-10-CM | POA: Diagnosis not present

## 2012-05-26 DIAGNOSIS — Z713 Dietary counseling and surveillance: Secondary | ICD-10-CM | POA: Diagnosis not present

## 2012-06-02 DIAGNOSIS — L538 Other specified erythematous conditions: Secondary | ICD-10-CM | POA: Diagnosis not present

## 2012-06-02 DIAGNOSIS — L408 Other psoriasis: Secondary | ICD-10-CM | POA: Diagnosis not present

## 2012-06-03 DIAGNOSIS — M47817 Spondylosis without myelopathy or radiculopathy, lumbosacral region: Secondary | ICD-10-CM | POA: Diagnosis not present

## 2012-06-03 DIAGNOSIS — M5380 Other specified dorsopathies, site unspecified: Secondary | ICD-10-CM | POA: Diagnosis not present

## 2012-06-11 DIAGNOSIS — M47817 Spondylosis without myelopathy or radiculopathy, lumbosacral region: Secondary | ICD-10-CM | POA: Diagnosis not present

## 2012-06-11 DIAGNOSIS — I517 Cardiomegaly: Secondary | ICD-10-CM | POA: Diagnosis not present

## 2012-06-11 DIAGNOSIS — K219 Gastro-esophageal reflux disease without esophagitis: Secondary | ICD-10-CM | POA: Diagnosis not present

## 2012-06-11 DIAGNOSIS — E669 Obesity, unspecified: Secondary | ICD-10-CM | POA: Diagnosis not present

## 2012-06-11 DIAGNOSIS — R911 Solitary pulmonary nodule: Secondary | ICD-10-CM | POA: Diagnosis not present

## 2012-06-11 DIAGNOSIS — J42 Unspecified chronic bronchitis: Secondary | ICD-10-CM | POA: Diagnosis not present

## 2012-06-11 DIAGNOSIS — I10 Essential (primary) hypertension: Secondary | ICD-10-CM | POA: Diagnosis not present

## 2012-06-11 DIAGNOSIS — Z79899 Other long term (current) drug therapy: Secondary | ICD-10-CM | POA: Diagnosis not present

## 2012-06-11 DIAGNOSIS — Z01818 Encounter for other preprocedural examination: Secondary | ICD-10-CM | POA: Diagnosis not present

## 2012-06-16 DIAGNOSIS — M47817 Spondylosis without myelopathy or radiculopathy, lumbosacral region: Secondary | ICD-10-CM | POA: Diagnosis not present

## 2012-06-16 DIAGNOSIS — Z79899 Other long term (current) drug therapy: Secondary | ICD-10-CM | POA: Diagnosis not present

## 2012-06-16 DIAGNOSIS — X58XXXA Exposure to other specified factors, initial encounter: Secondary | ICD-10-CM | POA: Diagnosis not present

## 2012-06-16 DIAGNOSIS — Y999 Unspecified external cause status: Secondary | ICD-10-CM | POA: Diagnosis not present

## 2012-06-16 DIAGNOSIS — I10 Essential (primary) hypertension: Secondary | ICD-10-CM | POA: Diagnosis not present

## 2012-06-16 DIAGNOSIS — K449 Diaphragmatic hernia without obstruction or gangrene: Secondary | ICD-10-CM | POA: Diagnosis not present

## 2012-06-16 DIAGNOSIS — Z8052 Family history of malignant neoplasm of bladder: Secondary | ICD-10-CM | POA: Diagnosis not present

## 2012-06-16 DIAGNOSIS — M539 Dorsopathy, unspecified: Secondary | ICD-10-CM | POA: Diagnosis not present

## 2012-06-16 DIAGNOSIS — E669 Obesity, unspecified: Secondary | ICD-10-CM | POA: Diagnosis present

## 2012-06-16 DIAGNOSIS — K219 Gastro-esophageal reflux disease without esophagitis: Secondary | ICD-10-CM | POA: Diagnosis present

## 2012-06-16 DIAGNOSIS — S32009A Unspecified fracture of unspecified lumbar vertebra, initial encounter for closed fracture: Secondary | ICD-10-CM | POA: Diagnosis not present

## 2012-06-16 DIAGNOSIS — Z6836 Body mass index (BMI) 36.0-36.9, adult: Secondary | ICD-10-CM | POA: Diagnosis not present

## 2012-06-16 DIAGNOSIS — G4730 Sleep apnea, unspecified: Secondary | ICD-10-CM | POA: Diagnosis present

## 2012-06-16 DIAGNOSIS — Y929 Unspecified place or not applicable: Secondary | ICD-10-CM | POA: Diagnosis not present

## 2012-06-21 DIAGNOSIS — Z5189 Encounter for other specified aftercare: Secondary | ICD-10-CM | POA: Diagnosis not present

## 2012-06-21 DIAGNOSIS — Z4789 Encounter for other orthopedic aftercare: Secondary | ICD-10-CM | POA: Diagnosis not present

## 2012-06-21 DIAGNOSIS — I10 Essential (primary) hypertension: Secondary | ICD-10-CM | POA: Diagnosis not present

## 2012-06-22 DIAGNOSIS — Z5189 Encounter for other specified aftercare: Secondary | ICD-10-CM | POA: Diagnosis not present

## 2012-06-22 DIAGNOSIS — I10 Essential (primary) hypertension: Secondary | ICD-10-CM | POA: Diagnosis not present

## 2012-06-22 DIAGNOSIS — Z4789 Encounter for other orthopedic aftercare: Secondary | ICD-10-CM | POA: Diagnosis not present

## 2012-06-24 DIAGNOSIS — Z981 Arthrodesis status: Secondary | ICD-10-CM | POA: Diagnosis not present

## 2012-06-24 DIAGNOSIS — M5137 Other intervertebral disc degeneration, lumbosacral region: Secondary | ICD-10-CM | POA: Diagnosis not present

## 2012-06-24 DIAGNOSIS — M47817 Spondylosis without myelopathy or radiculopathy, lumbosacral region: Secondary | ICD-10-CM | POA: Diagnosis not present

## 2012-06-25 DIAGNOSIS — I10 Essential (primary) hypertension: Secondary | ICD-10-CM | POA: Diagnosis not present

## 2012-06-25 DIAGNOSIS — Z4789 Encounter for other orthopedic aftercare: Secondary | ICD-10-CM | POA: Diagnosis not present

## 2012-06-25 DIAGNOSIS — Z5189 Encounter for other specified aftercare: Secondary | ICD-10-CM | POA: Diagnosis not present

## 2012-06-30 DIAGNOSIS — Z4789 Encounter for other orthopedic aftercare: Secondary | ICD-10-CM | POA: Diagnosis not present

## 2012-06-30 DIAGNOSIS — I10 Essential (primary) hypertension: Secondary | ICD-10-CM | POA: Diagnosis not present

## 2012-06-30 DIAGNOSIS — Z5189 Encounter for other specified aftercare: Secondary | ICD-10-CM | POA: Diagnosis not present

## 2012-07-02 DIAGNOSIS — I10 Essential (primary) hypertension: Secondary | ICD-10-CM | POA: Diagnosis not present

## 2012-07-02 DIAGNOSIS — Z4789 Encounter for other orthopedic aftercare: Secondary | ICD-10-CM | POA: Diagnosis not present

## 2012-07-02 DIAGNOSIS — Z5189 Encounter for other specified aftercare: Secondary | ICD-10-CM | POA: Diagnosis not present

## 2012-07-06 DIAGNOSIS — Z5189 Encounter for other specified aftercare: Secondary | ICD-10-CM | POA: Diagnosis not present

## 2012-07-06 DIAGNOSIS — I10 Essential (primary) hypertension: Secondary | ICD-10-CM | POA: Diagnosis not present

## 2012-07-06 DIAGNOSIS — Z4789 Encounter for other orthopedic aftercare: Secondary | ICD-10-CM | POA: Diagnosis not present

## 2012-07-08 DIAGNOSIS — I10 Essential (primary) hypertension: Secondary | ICD-10-CM | POA: Diagnosis not present

## 2012-07-08 DIAGNOSIS — Z4789 Encounter for other orthopedic aftercare: Secondary | ICD-10-CM | POA: Diagnosis not present

## 2012-07-08 DIAGNOSIS — Z5189 Encounter for other specified aftercare: Secondary | ICD-10-CM | POA: Diagnosis not present

## 2012-07-13 DIAGNOSIS — I10 Essential (primary) hypertension: Secondary | ICD-10-CM | POA: Diagnosis not present

## 2012-07-13 DIAGNOSIS — Z4789 Encounter for other orthopedic aftercare: Secondary | ICD-10-CM | POA: Diagnosis not present

## 2012-07-13 DIAGNOSIS — Z5189 Encounter for other specified aftercare: Secondary | ICD-10-CM | POA: Diagnosis not present

## 2012-07-29 DIAGNOSIS — Z6838 Body mass index (BMI) 38.0-38.9, adult: Secondary | ICD-10-CM | POA: Diagnosis not present

## 2012-07-29 DIAGNOSIS — R109 Unspecified abdominal pain: Secondary | ICD-10-CM | POA: Diagnosis not present

## 2012-08-12 ENCOUNTER — Ambulatory Visit (HOSPITAL_COMMUNITY)
Admission: RE | Admit: 2012-08-12 | Discharge: 2012-08-12 | Disposition: A | Discharge status: Home / Self Care | Payer: Medicare Other | Source: Ambulatory Visit | Attending: Neurosurgery | Admitting: Neurosurgery

## 2012-08-12 DIAGNOSIS — R29898 Other symptoms and signs involving the musculoskeletal system: Secondary | ICD-10-CM | POA: Insufficient documentation

## 2012-08-12 DIAGNOSIS — I10 Essential (primary) hypertension: Secondary | ICD-10-CM | POA: Diagnosis not present

## 2012-08-12 DIAGNOSIS — M545 Low back pain, unspecified: Secondary | ICD-10-CM | POA: Diagnosis not present

## 2012-08-12 DIAGNOSIS — IMO0001 Reserved for inherently not codable concepts without codable children: Secondary | ICD-10-CM | POA: Insufficient documentation

## 2012-08-12 DIAGNOSIS — M6281 Muscle weakness (generalized): Secondary | ICD-10-CM | POA: Diagnosis not present

## 2012-08-12 NOTE — Evaluation (Signed)
Physical Therapy Evaluation  Patient Details  Name: Alexander Nunez MRN: [id number] Date of Birth: 10-09-1945  Today's Date: 08/12/2012 Time: 0805-0850 PT Time Calculation (min): 45 min Charge:  Evaluation 8:05-8:35; there ex 8:35-8:48             Visit#: 1 of 8  Re-eval: 09/11/12 Assessment Diagnosis: S/P lumbar surgery Surgical Date: 06/16/12 Next MD Visit: 09/05/2012 Prior Therapy: not since surgery   Authorization: medicare     Authorization Visit#: 1 of 8   Past Medical History:  Past Medical History  Diagnosis Date  . High cholesterol   . Hypertension   . Heart palpitations   . GERD (gastroesophageal reflux disease)   . Anxiety   . Colon polyp   . Irritable bowel syndrome   . Back pain, chronic   . Osteoporosis   . Psoriasis    Past Surgical History:  Past Surgical History  Procedure Laterality Date  . Foot fracture surgery      left foot  . Knee arthroscopy      left    Subjective Symptoms/Limitations Symptoms: Alexander Nunez has been having back pain for two years. He has taken epidural shots which are not helping any longer.  He Had an MRI and the MRI showed that he needed surgery.  He tried therapy which did not help.  He then had nurve burning which did not help.  The patient ended up having surgery with spacing and fusion.  The patient is now being referred for therapy for core strengthening to improve his functional mobility.  The patient surgery was on 06-16-2012. Pertinent History: L arthroscopic surgery How long can you sit comfortably?: no difficulty as long as it is in a straight chair.   How long can you stand comfortably?: Able to stand without difficulty How long can you walk comfortably?: walking 30 minutes. Pain Assessment Currently in Pain?: No/denies  Balance Screening Balance Screen Has the patient fallen in the past 6 months: No Cognition Overall Cognitive Status: Within Functional Limits for tasks assessed Assessment RLE Strength Right  Hip Flexion: 5/5 Right Hip Extension:  (4+/5) Right Hip ABduction: 5/5 Right Knee Flexion: 5/5 Right Knee Extension: 5/5 Right Ankle Dorsiflexion:  (5-/5) LLE Strength Left Hip Flexion:  (4+/5) Left Hip Extension:  (4+/5) Left Hip ABduction: 5/5 Left Knee Flexion: 4/5 Left Knee Extension: 5/5 Left Ankle Dorsiflexion: 4/5 Lumbar AROM Lumbar Extension:  (decreased 30%) Lumbar - Right Side Bend:  (wnl ) Lumbar - Left Side Bend: wnl Lumbar - Right Rotation: deceased 20% Lumbar - Left Rotation: decreased 20%  Exercise/Treatments    Stretches Active Hamstring Stretch: 1 rep;30 seconds Single Knee to Chest Stretch: 1 rep;30 seconds Standing Side Bend: 5 reps Standing Extension: 5 reps   Supine Ab Set: 10 reps Bent Knee Raise: 10 reps     Physical Therapy Assessment and Plan PT Assessment and Plan Clinical Impression Statement: Pt s/p back surgery for unresolved pain who has decreased mm strength, decreased ROM and fair body mechanics who will benefit from skilled PT to educate on core stability, flexibility and body mechanics to allow pt to return to previous functional level to improve his quality of life. Rehab Potential: Good PT Frequency: Min 2X/week PT Duration: 4 weeks PT Treatment/Interventions: Functional mobility training;Therapeutic exercise;Manual techniques;Patient/family education PT Plan: Pt has good understanding of stability.  Begin  T-band, bridge, floating SLR, prone SLR.  3rd treatment add SAR, opposite, arm/leg, standing functional squats, progress to standing wall push up, UE B  flexion, and golf swings as able.    Goals Home Exercise Program Pt/caregiver will Perform Home Exercise Program: For increased ROM PT Goal: Perform Home Exercise Program - Progress: Goal set today PT Short Term Goals Time to Complete Short Term Goals: 2 weeks PT Short Term Goal 1: Pt to be able to demonstrate good body mechanics when lifting 12' to thigh high to prevent future  injury. PT Short Term Goal 2: Pt to be able to walk for an hour at a time without any increase pain PT Long Term Goals Time to Complete Long Term Goals: 4 weeks PT Long Term Goal 1: I in advance HEP PT Long Term Goal 2: PT to be able to return to playing 18 holes of golf without pain Long Term Goal 3: strength of LE to be 5/5 to allow the above to occur Long Term Goal 4: Pt pain to be no greater than a 2/10 80% of the day.  Problem List Patient Active Problem List   Diagnosis Date Noted  . Weakness of left leg 08/12/2012  . Generalized abdominal pain 09/02/2011  . PERSONAL HX COLONIC POLYPS 04/19/2009  . IRRITABLE BOWEL SYNDROME 04/06/2008  . ABDOMINAL BLOATING 03/07/2008  . CHANGE IN BOWELS 03/07/2008  . ABDOMINAL PAIN-MULTIPLE SITES 03/07/2008  . HYPERTENSION 03/06/2008  . GERD 03/06/2008  . GASTRITIS 03/06/2008  . CHRONIC RESPIRATORY DISEASE ARISE PERINTL PERIOD 03/06/2008  . HYPERLIPIDEMIA 02/06/2003    PT - End of Session Equipment Utilized During Treatment: Gait belt PT Plan of Care PT Home Exercise Plan: given  GP Functional Assessment Tool Used: clinical judgement /oswestry Functional Limitation: Self care Self Care Current Status (Z6109): At least 20 percent but less than 40 percent impaired, limited or restricted Self Care Goal Status (U0454): At least 1 percent but less than 20 percent impaired, limited or restricted  Alexander Nunez,CINDY 08/12/2012, 1:49 PM  Physician Documentation Your signature is required to indicate approval of the treatment plan as stated above.  Please sign and either send electronically or make a copy of this report for your files and return this physician signed original.   Please mark one 1.__approve of plan  2. ___approve of plan with the following conditions.   ______________________________                                                          _____________________ Physician Signature                                                                                                              Date

## 2012-08-17 ENCOUNTER — Ambulatory Visit (HOSPITAL_COMMUNITY)
Admission: RE | Admit: 2012-08-17 | Discharge: 2012-08-17 | Disposition: A | Discharge status: Home / Self Care | Payer: Medicare Other | Source: Ambulatory Visit | Attending: Family Medicine | Admitting: Family Medicine

## 2012-08-17 NOTE — Progress Notes (Signed)
Physical Therapy Treatment Patient Details  Name: Alexander Nunez MRN: [id number] Date of Birth: 18-Mar-1945  Today's Date: 08/17/2012 Time: 3086-5784 PT Time Calculation (min): 39 min  Visit#: 2 of 8  Re-eval: 09/11/12 Authorization: medicare  Authorization Visit#: 2 of 8  Charges:  therex 38'  Subjective: Symptoms/Limitations Symptoms: Pt stateas he has general soreness today.  States he walked 2 miles and has been compliant with HEP. Pain Assessment Currently in Pain?: No/denies   Exercise/Treatments Stretches Active Hamstring Stretch: 2 reps;30 seconds Single Knee to Chest Stretch: 2 reps;30 seconds Standing Scapular Retraction: 10 reps;Theraband Theraband Level (Scapular Retraction): Level 3 (Green) Row: 10 reps;Theraband Theraband Level (Row): Level 3 (Green) Shoulder Extension: 10 reps;Theraband Theraband Level (Shoulder Extension): Level 3 (Green) Seated Other Seated Lumbar Exercises: UBE 6' backward Supine Ab Set: 10 reps Bent Knee Raise: 10 reps Bridge: 10 reps Straight Leg Raise: 10 reps;Limitations Straight Leg Raises Limitations: floating    Physical Therapy Assessment and Plan PT Assessment:  Pt able to perform established therex correctly, only needing vc's for breathing.  Added postural theraband exercises as well as core progression in supine with good form/control.   PT Plan: Pt has good understanding of stability.  Begin prone SLR,  SAR, opposite arm/leg, standing functional squats, progress to standing wall push up, UE B flexion, and golf swings as able.     Problem List Patient Active Problem List   Diagnosis Date Noted  . Weakness of left leg 08/12/2012  . Generalized abdominal pain 09/02/2011  . PERSONAL HX COLONIC POLYPS 04/19/2009  . IRRITABLE BOWEL SYNDROME 04/06/2008  . ABDOMINAL BLOATING 03/07/2008  . CHANGE IN BOWELS 03/07/2008  . ABDOMINAL PAIN-MULTIPLE SITES 03/07/2008  . HYPERTENSION 03/06/2008  . GERD 03/06/2008  . GASTRITIS  03/06/2008  . CHRONIC RESPIRATORY DISEASE ARISE PERINTL PERIOD 03/06/2008  . HYPERLIPIDEMIA 02/06/2003    General Behavior During Therapy: [facility] for tasks assessed/performed PT Plan of Care PT Home Exercise Plan: given   Lurena Nida, PTA/CLT 08/17/2012, 8:49 AM

## 2012-08-19 ENCOUNTER — Telehealth (HOSPITAL_COMMUNITY): Payer: Self-pay

## 2012-08-19 ENCOUNTER — Ambulatory Visit (HOSPITAL_COMMUNITY): Payer: Medicare Other | Admitting: *Deleted

## 2012-08-24 ENCOUNTER — Ambulatory Visit (HOSPITAL_COMMUNITY)
Admission: RE | Admit: 2012-08-24 | Discharge: 2012-08-24 | Disposition: A | Discharge status: Home / Self Care | Payer: Medicare Other | Source: Ambulatory Visit | Attending: Family Medicine | Admitting: Family Medicine

## 2012-08-24 DIAGNOSIS — R29898 Other symptoms and signs involving the musculoskeletal system: Secondary | ICD-10-CM

## 2012-08-24 NOTE — Progress Notes (Signed)
Physical Therapy Treatment Patient Details  Name: Alexander Nunez MRN: [id number] Date of Birth: [birth year]  Today's Date: 08/24/2012 Time: 4098-1191 PT Time Calculation (min): 42 min Charge:  There ex x 3;  [phone] Visit#: 3 of 8  Re-eval: 09/11/12  Authorization: medicare   Authorization Visit#: 3 of 8   Subjective: Symptoms/Limitations Symptoms: Pt states that he had a sinus infection and was unable to attend last treatment but has been doing exercises at home.  PT pleased with progress Pain Assessment Currently in Pain?: No/denies   Exercise/Treatments     Stretches Single Knee to Chest Stretch: 2 reps;30 seconds Standing Wall Slides: 10 reps Scapular Retraction: 10 reps;Theraband Theraband Level (Scapular Retraction): Level 3 (Green) Row: 10 reps;Theraband Theraband Level (Row): Level 3 (Green) Shoulder Extension: 10 reps;Theraband Theraband Level (Shoulder Extension): Level 3 (Green) Other Standing Lumbar Exercises: stand against wall B UE flex for postural control   Sidelying Clam: 10 reps Hip Abduction: 10 reps Prone  Straight Leg Raise: 10 reps Other Prone Lumbar Exercises: B shld extension x 10; heel squeeze x 10 Quadruped       Physical Therapy Assessment and Plan PT Assessment and Plan Clinical Impression Statement: Pt doing well with program.  Pt needs multifocal cuing to complete new stab exercises correctly.  Pt unable to complete prone SAR or wback with proper stabilization,(will try each time to gage improvement of core strength). Rehab Potential: Good PT Plan: Pt to begin wall push up, and assess success with prone w back stabilizing lumbar area as he is able work into prone single arm, double arm and opposite arm/leg raise.  May D/C t-band as given for HEP     Goals  progressing  Problem List Patient Active Problem List   Diagnosis Date Noted  . Weakness of left leg 08/12/2012  . Generalized abdominal pain 09/02/2011  . PERSONAL HX COLONIC  POLYPS 04/19/2009  . IRRITABLE BOWEL SYNDROME 04/06/2008  . ABDOMINAL BLOATING 03/07/2008  . CHANGE IN BOWELS 03/07/2008  . ABDOMINAL PAIN-MULTIPLE SITES 03/07/2008  . HYPERTENSION 03/06/2008  . GERD 03/06/2008  . GASTRITIS 03/06/2008  . CHRONIC RESPIRATORY DISEASE ARISE PERINTL PERIOD 03/06/2008  . HYPERLIPIDEMIA 02/06/2003    General Behavior During Therapy: [facility] for tasks assessed/performed PT Plan of Care PT Home Exercise Plan: new given  GP    Ell Tiso,CINDY 08/24/2012, 8:54 AM

## 2012-08-26 ENCOUNTER — Ambulatory Visit (HOSPITAL_COMMUNITY)
Admission: RE | Admit: 2012-08-26 | Discharge: 2012-08-26 | Disposition: A | Discharge status: Home / Self Care | Payer: Medicare Other | Source: Ambulatory Visit | Attending: Family Medicine | Admitting: Family Medicine

## 2012-08-26 DIAGNOSIS — R29898 Other symptoms and signs involving the musculoskeletal system: Secondary | ICD-10-CM

## 2012-08-26 NOTE — Progress Notes (Signed)
Physical Therapy Treatment Patient Details  Name: Alexander Nunez MRN: [id number] Date of Birth: [birth year]  Today's Date: 08/26/2012 Time: 0815-0845 PT Time Calculation (min): 30 min Charges:  TE: [phone] Manual: [phone] Visit#: 4 of 8  Re-eval: 09/11/12    Authorization: medicare  Authorization Time Period:    Authorization Visit#: 4 of 8   Subjective: Symptoms/Limitations Symptoms: Pt reports that he was really sore after the last time he left.  This morning is not as bad.  Pain Assessment Currently in Pain?: Yes Pain Score: 5  Pain Location: Back (Hip) Pain Orientation: Right  Precautions/Restrictions     Exercise/Treatments  Stretches Active Hamstring Stretch: 3 reps;30 seconds Piriformis Stretch: 3 reps;30 seconds Supine Ab Set: 10 reps;Limitations AB Set Limitations: 10 sec holds, VC and TC to improve coordinated movements Bridge: 15 reps  Manual Therapy Manual Therapy: Massage Massage: soft and deep tissue massage to Rt lumbar/gluteal region to decrease pain.    Physical Therapy Assessment and Plan PT Assessment and Plan Clinical Impression Statement: Pt 15 min late due to transportation issues.  Continues to require multimodal cueing for TrA activation with ab sets.  Encouraged to continue with core strengthening, held prone activities secondary to increased pain, added manual techniques at end of session with significant decrease in pain.  Rehab Potential: Good PT Plan: Pt to begin wall push up, and assess success with prone w back stabilizing lumbar area as he is able work into prone single arm, double arm and opposite arm/leg raise.  May D/C t-band as given for HEP     Goals    Problem List Patient Active Problem List   Diagnosis Date Noted  . Weakness of left leg 08/12/2012  . Generalized abdominal pain 09/02/2011  . PERSONAL HX COLONIC POLYPS 04/19/2009  . IRRITABLE BOWEL SYNDROME 04/06/2008  . ABDOMINAL BLOATING 03/07/2008  . CHANGE IN BOWELS  03/07/2008  . ABDOMINAL PAIN-MULTIPLE SITES 03/07/2008  . HYPERTENSION 03/06/2008  . GERD 03/06/2008  . GASTRITIS 03/06/2008  . CHRONIC RESPIRATORY DISEASE ARISE PERINTL PERIOD 03/06/2008  . HYPERLIPIDEMIA 02/06/2003    General Behavior During Therapy: [facility] for tasks assessed/performed PT Plan of Care PT Patient Instructions: encouraged to continue with increasing water intake and continue with exercises in pain free motion.  Consulted and Agree with Plan of Care: Patient  GP    Lisset Ketchem 08/26/2012, 8:49 AM

## 2012-08-31 ENCOUNTER — Ambulatory Visit (HOSPITAL_COMMUNITY)
Admission: RE | Admit: 2012-08-31 | Discharge: 2012-08-31 | Disposition: A | Discharge status: Home / Self Care | Payer: Medicare Other | Source: Ambulatory Visit | Attending: Family Medicine | Admitting: Family Medicine

## 2012-08-31 NOTE — Progress Notes (Signed)
Physical Therapy Treatment Patient Details  Name: Alexander Nunez MRN: [id number] Date of Birth: [birth year]  Today's Date: 08/31/2012 Time: 4098-1191 PT Time Calculation (min): 42 min  Visit#: 5 of 8  Re-eval: 09/11/12 Authorization: medicare  Authorization Visit#: 5 of 8  Charges:  therex [phone] (26'), manual [phone] (14)  Subjective: Symptoms/Limitations Symptoms: Pt sttes his pain is not bad today, 3/10 Rt lumbar region. Pain Assessment Currently in Pain?: Yes Pain Score: 3  Pain Location: Back Pain Orientation: Right   Exercise/Treatments Stretches Active Hamstring Stretch: 2 reps;30 seconds Single Knee to Chest Stretch: 2 reps;30 seconds Piriformis Stretch: 2 reps;30 seconds Supine Ab Set: 10 reps;Limitations AB Set Limitations: 10 sec holds, VC and TC to improve coordinated movements Bridge: 15 reps Straight Leg Raise: 10 reps;Limitations Straight Leg Raises Limitations: floating Sidelying Clam: 10 reps Hip Abduction: 10 reps    Manual Therapy Manual Therapy: Massage Massage: Prone soft and deep tissue massage to Rt lumbar/gluteal region to decrease pain. [phone]   Physical Therapy Assessment and Plan PT Assessment and Plan Clinical Impression Statement: Pt with decreased pain today as compared to last visit.  Added additional supine/SL exercises back to POC without pain/difficuilty.  VC's needed for form/stab with SL exercises.  Pt without pain at end of session following manual techniques. Rehab Potential: Good PT Plan: Pt to begin wall push up, and prone w back stabilizing lumbar area as he is able work into prone single arm, double arm and opposite arm/leg raise.  May add sciatic nerve glides.   May D/C t-band as given for HEP      Problem List Patient Active Problem List   Diagnosis Date Noted  . Weakness of left leg 08/12/2012  . Generalized abdominal pain 09/02/2011  . PERSONAL HX COLONIC POLYPS 04/19/2009  . IRRITABLE BOWEL SYNDROME 04/06/2008  .  ABDOMINAL BLOATING 03/07/2008  . CHANGE IN BOWELS 03/07/2008  . ABDOMINAL PAIN-MULTIPLE SITES 03/07/2008  . HYPERTENSION 03/06/2008  . GERD 03/06/2008  . GASTRITIS 03/06/2008  . CHRONIC RESPIRATORY DISEASE ARISE PERINTL PERIOD 03/06/2008  . HYPERLIPIDEMIA 02/06/2003    General Behavior During Therapy: [facility] for tasks assessed/performed   Lurena Nida, PTA/CLT 08/31/2012, 8:51 AM

## 2012-09-02 ENCOUNTER — Ambulatory Visit (HOSPITAL_COMMUNITY)
Admission: RE | Admit: 2012-09-02 | Discharge: 2012-09-02 | Disposition: A | Discharge status: Home / Self Care | Payer: Medicare Other | Source: Ambulatory Visit | Attending: Family Medicine | Admitting: Family Medicine

## 2012-09-02 NOTE — Progress Notes (Signed)
Physical Therapy Treatment Patient Details  Name: Alexander Nunez MRN: [id number] Date of Birth: [birth year]  Today's Date: 09/02/2012 Time: 0803-0845 PT Time Calculation (min): 42 min Visit#: 6 of 8  Re-eval: 09/11/12 Authorization: medicare  Authorization Visit#: 6 of 8  Charges:  therex [phone] (30'), manual [phone] (10')  Subjective: Symptoms/Limitations Symptoms: Pt states he is walking more.  Reports he is painfree today. Pain Assessment Currently in Pain?: No/denies   Exercise/Treatments Stretches Single Knee to Chest Stretch: 2 reps;30 seconds Standing Heel Raises: 10 reps;Limitations Heel Raises Limitations: toeraises 10 reps Wall Slides: 10 reps Other Standing Lumbar Exercises: wall push ups 10 reps Supine Bridge: 15 reps Straight Leg Raise: 10 reps;Limitations Straight Leg Raises Limitations: floating Large Ball Abdominal Isometric: 10 reps Large Ball Oblique Isometric: 10 reps Prone  Single Arm Raise: 10 reps Straight Leg Raise: 10 reps Other Prone Lumbar Exercises: heelsqueeze 10X5"   Manual Therapy Manual Therapy: Massage Massage: Prone soft and deep tissue massage to Rt lumbar/gluteal region to decrease pain.   Physical Therapy Assessment and Plan PT Assessment and Plan Clinical Impression Statement: Progressed abdominal exercises with use of physioball today.  Also added wall pushups, heell/toe raises and wall squats.  discussed lifting techniques as pt states he uses his legs mostly with lifting and bending forward.  Less tightness spasm palpated in Rt glute area today with DTM.  Pt progressing well with overall reduced pain. PT Plan:  Begin body mechanics/lifting techniques next visit.  Re-eval X 2 more visits.   May add sciatic nerve glides.       General Behavior During Therapy: [facility] for tasks assessed/performed PT Plan of Care PT Home Exercise Plan: Tband given   Lurena Nida, PTA/CLT 09/02/2012, 8:27 AM

## 2012-09-07 ENCOUNTER — Ambulatory Visit (HOSPITAL_COMMUNITY)
Admission: RE | Admit: 2012-09-07 | Discharge: 2012-09-07 | Disposition: A | Discharge status: Home / Self Care | Payer: Medicare Other | Source: Ambulatory Visit | Attending: Neurosurgery | Admitting: Neurosurgery

## 2012-09-07 DIAGNOSIS — IMO0001 Reserved for inherently not codable concepts without codable children: Secondary | ICD-10-CM | POA: Diagnosis not present

## 2012-09-07 DIAGNOSIS — I10 Essential (primary) hypertension: Secondary | ICD-10-CM | POA: Insufficient documentation

## 2012-09-07 DIAGNOSIS — M545 Low back pain, unspecified: Secondary | ICD-10-CM | POA: Insufficient documentation

## 2012-09-07 DIAGNOSIS — M6281 Muscle weakness (generalized): Secondary | ICD-10-CM | POA: Insufficient documentation

## 2012-09-07 NOTE — Progress Notes (Signed)
Physical Therapy Treatment Patient Details  Name: Alexander Nunez MRN: [id number] Date of Birth: Jun 11, 1945  Today's Date: 09/07/2012 Time: 0807-0842 PT Time Calculation (min): 35 min Visit#: 7 of 8  Re-eval: 09/11/12 Authorization: medicare  Authorization Visit#: 7 of 8  Charges:  therex [phone] (23'), manual [phone] (10')  Subjective: Symptoms/Limitations Symptoms: Pt states he is sore today in his Rt lower lumbar area due to burrying electric fence to start training his dog.   Pain Assessment Currently in Pain?: Yes Pain Score: 3  Pain Location: Back Pain Orientation: Right;Lower   Exercise/Treatments Standing Heel Raises: 15 reps;Limitations Heel Raises Limitations: toeraises 15 reps Functional Squats: 10 reps Lifting: From floor;5 reps;Limitations Lifting Limitations: to waist, golf lifting technique Other Standing Lumbar Exercises: wall push ups 10 reps Supine Bridge: 15 reps Straight Leg Raise: 15 reps;Limitations Straight Leg Raises Limitations: floating Large Ball Abdominal Isometric: 10 reps Large Ball Oblique Isometric: 10 reps Sidelying Clam: 10 reps Hip Abduction: 10 reps Prone  Single Arm Raise: 10 reps Straight Leg Raise: 10 reps Other Prone Lumbar Exercises: heelsqueeze 15X5"    Manual Therapy Manual Therapy: Other (comment) Massage: Prone DTM and MFR to Rt lumbar/gluteal region to decrease pain/tightness.  Physical Therapy Assessment and Plan PT Assessment and Plan Clinical Impression Statement: Instructed in proper body mechanics/lifting techniques.  Pt able to demonstrate appropriately needing minimal VC's.  Continued manual techniques focusing on Rt glute/SI area today.  Overall reduction of tightness/pain at end of session.  Increased reps without difficulty.   PT Plan:  Re-eval next visit.       Problem List Patient Active Problem List   Diagnosis Date Noted  . Weakness of left leg 08/12/2012  . Generalized abdominal pain 09/02/2011  .  PERSONAL HX COLONIC POLYPS 04/19/2009  . IRRITABLE BOWEL SYNDROME 04/06/2008  . ABDOMINAL BLOATING 03/07/2008  . CHANGE IN BOWELS 03/07/2008  . ABDOMINAL PAIN-MULTIPLE SITES 03/07/2008  . HYPERTENSION 03/06/2008  . GERD 03/06/2008  . GASTRITIS 03/06/2008  . CHRONIC RESPIRATORY DISEASE ARISE PERINTL PERIOD 03/06/2008  . HYPERLIPIDEMIA 02/06/2003    General Behavior During Therapy: [facility] for tasks assessed/performed   Lurena Nida, PTA/CLT 09/07/2012, 8:49 AM

## 2012-09-09 ENCOUNTER — Ambulatory Visit (HOSPITAL_COMMUNITY)
Admission: RE | Admit: 2012-09-09 | Discharge: 2012-09-09 | Disposition: A | Discharge status: Home / Self Care | Payer: Medicare Other | Source: Ambulatory Visit | Attending: Internal Medicine | Admitting: Internal Medicine

## 2012-09-09 NOTE — Evaluation (Signed)
Physical Therapy Discharge Summary  Patient Details  Name: Alexander Nunez MRN: [id number] Date of Birth: 1945/04/03  Today's Date: 09/09/2012 Time: 0803-0828 PT Time Calculation (min): 25 min Charges: MMT x 4(0981-1914) Self care x 15' (0813-0828)               Visit#: 8 of 8  Re-eval: 09/11/12 Assessment Diagnosis: S/P lumbar surgery Surgical Date: 06/16/12 Next MD Visit: 09/09/2012  Authorization: medicare     Authorization Visit#: 8 of 8   Subjective Symptoms/Limitations Symptoms: Pt reports HEP complaince. Pain Assessment Currently in Pain?: No/denies  Sensation/Coordination/Flexibility/Functional Tests Functional Tests Functional Tests: Oswestry 10% (was 38% on 08/12/12)  Assessment RLE Strength Right Hip Flexion: 5/5 (was 5/5 on 08/12/12) Right Hip Extension: 5/5 ( was 4+/5 on 08/12/12) Right Hip ABduction: 5/5 (was 5/5 on 08/12/12) Right Knee Flexion: 5/5 (was 5/5 on 08/12/12) Right Knee Extension: 5/5 (was 5/5 on 08/12/12) Right Ankle Dorsiflexion: 5/5 (was 5-/5 on 08/12/12) LLE Strength Left Hip Flexion: 5/5 (was 4+/5 on 08/12/12) Left Hip Extension: 5/5 (was 4+/5 on 08/12/12) Left Hip ABduction: 5/5 (was 5/5 on 08/12/12) Left Knee Flexion: 5/5 (was 4/5 on 08/12/12) Left Knee Extension: 5/5 (was 5/5 on 08/12/12) Left Ankle Dorsiflexion: 5/5 (was 4/5 on 08/12/12) Lumbar AROM Lumbar Extension: wnl (was decreased 30% on 08/12/12) Lumbar - Right Side Bend: wnl (was wnl on 08/12/12) Lumbar - Left Side Bend: wnl (was wnl on 08/12/12) Lumbar - Right Rotation: wnl (was decreased 20% on 08/12/12) Lumbar - Left Rotation: wnl (was decreased 20% on 08/12/12)  Physical Therapy Assessment and Plan PT Assessment and Plan Clinical Impression Statement: Pt has met all goals except for returning to golf. Pt is waiting on permission from MD. Strength and ROM are WNL. Pt is no longer limited by pain. LEFS has improved 28%. Pt is comfortable with D/C to HEP. PT Plan: Recommend D/C to HEP.     Goals Home Exercise Program Pt/caregiver will Perform Home Exercise Program: For increased ROM PT Short Term Goals Time to Complete Short Term Goals: 2 weeks PT Short Term Goal 1: Pt to be able to demonstrate good body mechanics when lifting 12' to thigh high to prevent future injury. PT Short Term Goal 1 - Progress: Met PT Short Term Goal 2: Pt to be able to walk for an hour at a time without any increase pain PT Short Term Goal 2 - Progress: Met PT Long Term Goals Time to Complete Long Term Goals: 4 weeks PT Long Term Goal 1: I in advance HEP PT Long Term Goal 1 - Progress: Met PT Long Term Goal 2: Pt to be able to return to playing 18 holes of golf without pain PT Long Term Goal 2 - Progress: Progressing toward goal Long Term Goal 3: strength of LE to be 5/5 to allow the above to occur Long Term Goal 3 Progress: Met Long Term Goal 4: Pt pain to be no greater than a 2/10 80% of the day. Long Term Goal 4 Progress: Met  Problem List Patient Active Problem List   Diagnosis Date Noted  . Weakness of left leg 08/12/2012  . Generalized abdominal pain 09/02/2011  . PERSONAL HX COLONIC POLYPS 04/19/2009  . IRRITABLE BOWEL SYNDROME 04/06/2008  . ABDOMINAL BLOATING 03/07/2008  . CHANGE IN BOWELS 03/07/2008  . ABDOMINAL PAIN-MULTIPLE SITES 03/07/2008  . HYPERTENSION 03/06/2008  . GERD 03/06/2008  . GASTRITIS 03/06/2008  . CHRONIC RESPIRATORY DISEASE ARISE PERINTL PERIOD 03/06/2008  . HYPERLIPIDEMIA 02/06/2003  General Behavior During Therapy: [facility] for tasks assessed/performed  Seth Bake, PTA; Annett Fabian, MPT, ATC  09/09/2012, 8:42 AM  Physician Documentation Your signature is required to indicate approval of the treatment plan as stated above.  Please sign and either send electronically or make a copy of this report for your files and return this physician signed original.   Please mark one 1.__approve of plan  2. ___approve of plan with the following  conditions.   ______________________________                                                          _____________________ Physician Signature                                                                                                             Date

## 2012-09-14 ENCOUNTER — Other Ambulatory Visit (HOSPITAL_COMMUNITY): Payer: Self-pay | Admitting: Physician Assistant

## 2012-09-14 DIAGNOSIS — M79609 Pain in unspecified limb: Secondary | ICD-10-CM

## 2012-09-14 DIAGNOSIS — Z6838 Body mass index (BMI) 38.0-38.9, adult: Secondary | ICD-10-CM | POA: Diagnosis not present

## 2012-09-15 ENCOUNTER — Ambulatory Visit (HOSPITAL_COMMUNITY)
Admission: RE | Admit: 2012-09-15 | Discharge: 2012-09-15 | Disposition: A | Discharge status: Home / Self Care | Payer: Medicare Other | Source: Ambulatory Visit | Attending: Physician Assistant | Admitting: Physician Assistant

## 2012-09-15 DIAGNOSIS — M79609 Pain in unspecified limb: Secondary | ICD-10-CM | POA: Diagnosis not present

## 2012-10-16 ENCOUNTER — Other Ambulatory Visit: Payer: Self-pay | Admitting: Gastroenterology

## 2012-10-27 DIAGNOSIS — Z79899 Other long term (current) drug therapy: Secondary | ICD-10-CM | POA: Diagnosis not present

## 2012-10-27 DIAGNOSIS — Z09 Encounter for follow-up examination after completed treatment for conditions other than malignant neoplasm: Secondary | ICD-10-CM | POA: Diagnosis not present

## 2012-10-27 DIAGNOSIS — L408 Other psoriasis: Secondary | ICD-10-CM | POA: Diagnosis not present

## 2012-11-24 DIAGNOSIS — L408 Other psoriasis: Secondary | ICD-10-CM | POA: Diagnosis not present

## 2012-11-29 ENCOUNTER — Other Ambulatory Visit (HOSPITAL_COMMUNITY): Payer: Self-pay | Admitting: Family Medicine

## 2012-11-29 ENCOUNTER — Ambulatory Visit (HOSPITAL_COMMUNITY)
Admission: RE | Admit: 2012-11-29 | Discharge: 2012-11-29 | Disposition: A | Discharge status: Home / Self Care | Payer: Medicare Other | Source: Ambulatory Visit | Attending: Family Medicine | Admitting: Family Medicine

## 2012-11-29 DIAGNOSIS — Z6838 Body mass index (BMI) 38.0-38.9, adult: Secondary | ICD-10-CM | POA: Diagnosis not present

## 2012-11-29 DIAGNOSIS — N509 Disorder of male genital organs, unspecified: Secondary | ICD-10-CM

## 2012-11-29 DIAGNOSIS — N508 Other specified disorders of male genital organs: Secondary | ICD-10-CM | POA: Diagnosis not present

## 2012-11-29 DIAGNOSIS — N50811 Right testicular pain: Secondary | ICD-10-CM

## 2012-12-07 DIAGNOSIS — Z23 Encounter for immunization: Secondary | ICD-10-CM | POA: Diagnosis not present

## 2012-12-07 DIAGNOSIS — Z6839 Body mass index (BMI) 39.0-39.9, adult: Secondary | ICD-10-CM | POA: Diagnosis not present

## 2012-12-07 DIAGNOSIS — M79609 Pain in unspecified limb: Secondary | ICD-10-CM | POA: Diagnosis not present

## 2012-12-24 DIAGNOSIS — M47817 Spondylosis without myelopathy or radiculopathy, lumbosacral region: Secondary | ICD-10-CM | POA: Diagnosis not present

## 2012-12-24 DIAGNOSIS — M5380 Other specified dorsopathies, site unspecified: Secondary | ICD-10-CM | POA: Diagnosis not present

## 2013-01-05 DIAGNOSIS — Z79899 Other long term (current) drug therapy: Secondary | ICD-10-CM | POA: Diagnosis not present

## 2013-01-05 DIAGNOSIS — Z09 Encounter for follow-up examination after completed treatment for conditions other than malignant neoplasm: Secondary | ICD-10-CM | POA: Diagnosis not present

## 2013-01-05 DIAGNOSIS — L299 Pruritus, unspecified: Secondary | ICD-10-CM | POA: Diagnosis not present

## 2013-01-31 DIAGNOSIS — N411 Chronic prostatitis: Secondary | ICD-10-CM | POA: Insufficient documentation

## 2013-01-31 DIAGNOSIS — N50819 Testicular pain, unspecified: Secondary | ICD-10-CM | POA: Insufficient documentation

## 2013-01-31 DIAGNOSIS — N509 Disorder of male genital organs, unspecified: Secondary | ICD-10-CM | POA: Diagnosis not present

## 2013-02-03 HISTORY — PX: BACK SURGERY: SHX140

## 2013-03-16 DIAGNOSIS — L408 Other psoriasis: Secondary | ICD-10-CM | POA: Diagnosis not present

## 2013-03-22 DIAGNOSIS — E669 Obesity, unspecified: Secondary | ICD-10-CM | POA: Diagnosis not present

## 2013-03-22 DIAGNOSIS — I10 Essential (primary) hypertension: Secondary | ICD-10-CM | POA: Diagnosis not present

## 2013-03-22 DIAGNOSIS — M109 Gout, unspecified: Secondary | ICD-10-CM | POA: Diagnosis not present

## 2013-03-22 DIAGNOSIS — Z6839 Body mass index (BMI) 39.0-39.9, adult: Secondary | ICD-10-CM | POA: Diagnosis not present

## 2013-05-02 DIAGNOSIS — N4340 Spermatocele of epididymis, unspecified: Secondary | ICD-10-CM | POA: Insufficient documentation

## 2013-05-02 DIAGNOSIS — N411 Chronic prostatitis: Secondary | ICD-10-CM | POA: Diagnosis not present

## 2013-05-31 ENCOUNTER — Other Ambulatory Visit (HOSPITAL_COMMUNITY): Payer: Self-pay | Admitting: Family Medicine

## 2013-05-31 ENCOUNTER — Ambulatory Visit (HOSPITAL_COMMUNITY)
Admission: RE | Admit: 2013-05-31 | Discharge: 2013-05-31 | Disposition: A | Discharge status: Home / Self Care | Payer: Medicare Other | Source: Ambulatory Visit | Attending: Family Medicine | Admitting: Family Medicine

## 2013-05-31 DIAGNOSIS — S6990XA Unspecified injury of unspecified wrist, hand and finger(s), initial encounter: Principal | ICD-10-CM

## 2013-05-31 DIAGNOSIS — S59919A Unspecified injury of unspecified forearm, initial encounter: Secondary | ICD-10-CM | POA: Diagnosis not present

## 2013-05-31 DIAGNOSIS — M25539 Pain in unspecified wrist: Secondary | ICD-10-CM | POA: Insufficient documentation

## 2013-05-31 DIAGNOSIS — Z6839 Body mass index (BMI) 39.0-39.9, adult: Secondary | ICD-10-CM | POA: Diagnosis not present

## 2013-05-31 DIAGNOSIS — B353 Tinea pedis: Secondary | ICD-10-CM | POA: Diagnosis not present

## 2013-05-31 DIAGNOSIS — S59909A Unspecified injury of unspecified elbow, initial encounter: Secondary | ICD-10-CM | POA: Insufficient documentation

## 2013-05-31 DIAGNOSIS — W19XXXA Unspecified fall, initial encounter: Secondary | ICD-10-CM | POA: Insufficient documentation

## 2013-06-07 ENCOUNTER — Ambulatory Visit (HOSPITAL_COMMUNITY)
Admission: RE | Admit: 2013-06-07 | Discharge: 2013-06-07 | Disposition: A | Discharge status: Home / Self Care | Payer: Medicare Other | Source: Ambulatory Visit | Attending: Family Medicine | Admitting: Family Medicine

## 2013-06-07 ENCOUNTER — Other Ambulatory Visit (HOSPITAL_COMMUNITY): Payer: Self-pay | Admitting: Family Medicine

## 2013-06-07 DIAGNOSIS — M25579 Pain in unspecified ankle and joints of unspecified foot: Secondary | ICD-10-CM

## 2013-06-07 DIAGNOSIS — Z6839 Body mass index (BMI) 39.0-39.9, adult: Secondary | ICD-10-CM | POA: Diagnosis not present

## 2013-06-07 DIAGNOSIS — S8990XA Unspecified injury of unspecified lower leg, initial encounter: Secondary | ICD-10-CM | POA: Diagnosis not present

## 2013-06-07 DIAGNOSIS — X500XXA Overexertion from strenuous movement or load, initial encounter: Secondary | ICD-10-CM | POA: Insufficient documentation

## 2013-06-07 DIAGNOSIS — S99929A Unspecified injury of unspecified foot, initial encounter: Principal | ICD-10-CM

## 2013-06-07 DIAGNOSIS — S93409A Sprain of unspecified ligament of unspecified ankle, initial encounter: Secondary | ICD-10-CM

## 2013-06-07 DIAGNOSIS — S99919A Unspecified injury of unspecified ankle, initial encounter: Principal | ICD-10-CM

## 2013-06-07 DIAGNOSIS — M7730 Calcaneal spur, unspecified foot: Secondary | ICD-10-CM | POA: Insufficient documentation

## 2013-06-07 DIAGNOSIS — Y929 Unspecified place or not applicable: Secondary | ICD-10-CM | POA: Insufficient documentation

## 2013-06-13 DIAGNOSIS — L408 Other psoriasis: Secondary | ICD-10-CM | POA: Diagnosis not present

## 2013-06-13 DIAGNOSIS — B359 Dermatophytosis, unspecified: Secondary | ICD-10-CM | POA: Diagnosis not present

## 2013-06-13 DIAGNOSIS — Z79899 Other long term (current) drug therapy: Secondary | ICD-10-CM | POA: Diagnosis not present

## 2013-06-13 DIAGNOSIS — Z09 Encounter for follow-up examination after completed treatment for conditions other than malignant neoplasm: Secondary | ICD-10-CM | POA: Diagnosis not present

## 2013-07-06 ENCOUNTER — Other Ambulatory Visit: Payer: Self-pay | Admitting: Orthopaedic Surgery

## 2013-07-06 DIAGNOSIS — M25532 Pain in left wrist: Secondary | ICD-10-CM

## 2013-07-06 DIAGNOSIS — M25539 Pain in unspecified wrist: Secondary | ICD-10-CM | POA: Diagnosis not present

## 2013-07-14 ENCOUNTER — Other Ambulatory Visit (HOSPITAL_COMMUNITY): Payer: Self-pay | Admitting: Physician Assistant

## 2013-07-14 DIAGNOSIS — K589 Irritable bowel syndrome without diarrhea: Secondary | ICD-10-CM

## 2013-07-14 DIAGNOSIS — R1013 Epigastric pain: Secondary | ICD-10-CM | POA: Diagnosis not present

## 2013-07-14 DIAGNOSIS — Z79899 Other long term (current) drug therapy: Secondary | ICD-10-CM | POA: Diagnosis not present

## 2013-07-14 DIAGNOSIS — Z6838 Body mass index (BMI) 38.0-38.9, adult: Secondary | ICD-10-CM | POA: Diagnosis not present

## 2013-07-15 ENCOUNTER — Other Ambulatory Visit: Payer: Medicare Other

## 2013-07-19 ENCOUNTER — Ambulatory Visit (HOSPITAL_COMMUNITY)
Admission: RE | Admit: 2013-07-19 | Discharge: 2013-07-19 | Disposition: A | Discharge status: Home / Self Care | Payer: Medicare Other | Source: Ambulatory Visit | Attending: Physician Assistant | Admitting: Physician Assistant

## 2013-07-19 DIAGNOSIS — R1013 Epigastric pain: Secondary | ICD-10-CM | POA: Diagnosis not present

## 2013-07-19 DIAGNOSIS — K589 Irritable bowel syndrome without diarrhea: Secondary | ICD-10-CM

## 2013-07-21 ENCOUNTER — Other Ambulatory Visit: Payer: Medicare Other

## 2013-07-22 ENCOUNTER — Ambulatory Visit (INDEPENDENT_AMBULATORY_CARE_PROVIDER_SITE_OTHER)
Admission: RE | Admit: 2013-07-22 | Discharge: 2013-07-22 | Disposition: A | Discharge status: Home / Self Care | Payer: Medicare Other | Source: Ambulatory Visit | Attending: Nurse Practitioner | Admitting: Nurse Practitioner

## 2013-07-22 ENCOUNTER — Encounter: Payer: Self-pay | Admitting: Nurse Practitioner

## 2013-07-22 ENCOUNTER — Ambulatory Visit (INDEPENDENT_AMBULATORY_CARE_PROVIDER_SITE_OTHER): Payer: Medicare Other | Admitting: Nurse Practitioner

## 2013-07-22 ENCOUNTER — Telehealth: Payer: Self-pay | Admitting: Gastroenterology

## 2013-07-22 VITALS — BP 126/78 | HR 68 | Ht 71.5 in | Wt 261.8 lb

## 2013-07-22 DIAGNOSIS — R109 Unspecified abdominal pain: Secondary | ICD-10-CM | POA: Diagnosis not present

## 2013-07-22 DIAGNOSIS — R142 Eructation: Secondary | ICD-10-CM

## 2013-07-22 DIAGNOSIS — R141 Gas pain: Secondary | ICD-10-CM | POA: Diagnosis not present

## 2013-07-22 DIAGNOSIS — R110 Nausea: Secondary | ICD-10-CM | POA: Diagnosis not present

## 2013-07-22 DIAGNOSIS — R198 Other specified symptoms and signs involving the digestive system and abdomen: Secondary | ICD-10-CM | POA: Diagnosis not present

## 2013-07-22 DIAGNOSIS — R140 Abdominal distension (gaseous): Secondary | ICD-10-CM

## 2013-07-22 DIAGNOSIS — R143 Flatulence: Secondary | ICD-10-CM

## 2013-07-22 NOTE — Patient Instructions (Addendum)
Please go to the basement level to our lab for stool studies and also go to the X-Ray department.   You have been scheduled for a CT scan of the abdomen and pelvis at [facility] ([address]---this is in the same building as Press photographer).   You are scheduled on Wed 07-27-2013  WARNING: IF YOU ARE ALLERGIC TO IODINE/X-RAY DYE, PLEASE NOTIFY RADIOLOGY IMMEDIATELY AT [phone]! YOU WILL BE GIVEN A 13 HOUR PREMEDICATION PREP.  1) Do not eat or drink anything after 5:30 am  (4 hours prior to your test) 2) You have been given 2 bottles of oral contrast to drink. The solution may taste better if refrigerated, but do NOT add ice or any other liquid to this solution. Shake well before drinking.    Drink 1 bottle of contrast @ 7:30 am  (2 hours prior to your exam)  Drink 1 bottle of contrast @ 8:30 am  (1 hour prior to your exam)  You may take any medications as prescribed with a small amount of water except for the following: Metformin, Glucophage, Glucovance, Avandamet, Riomet, Fortamet, Actoplus Met, Janumet, Glumetza or Metaglip. The above medications must be held the day of the exam AND 48 hours after the exam.  The purpose of you drinking the oral contrast is to aid in the visualization of your intestinal tract. The contrast solution may cause some diarrhea. Before your exam is started, you will be given a small amount of fluid to drink. Depending on your individual set of symptoms, you may also receive an intravenous injection of x-ray contrast/dye. Plan on being at [facility] for 30 minutes or long, depending on the type of exam you are having performed.  If you have any questions regarding your exam or if you need to reschedule, you may call the CT department at [phone] between the hours of 8:00 am and 5:00 pm, Monday-Friday.  ________________________________________________________________________

## 2013-07-22 NOTE — Telephone Encounter (Signed)
Patient was seen by his primary care this week for abdominal pain. He reports he has had labs that showed iron def anemia and heme positive stool.  He reports worsening abdominal pain overnight.  He will come in this am and see Tye Savoy RNP

## 2013-07-22 NOTE — Progress Notes (Signed)
HPI :  Patient is a 68 year old male known to Dr. Fuller Plan for a history of adenomatous colon polyps. Due for recall colonoscopy Sept 2018.   Patient gives a longstanding history of IBS consisting of alternating bowel habits. He presents today for evaluation of abdominal discomfort, a new problem for him. Pain is diffuse, described as a a pressure sensation. He has also had several days of small volume loose stools associated with excessive gas. He though stools were starting to solidify yesterday but back to loose, small volume. He isn't passing any blood. No fevers. No urinary symptoms. He saw PCP, ultrasound negative except for enlarged, fatty liver. He was given kit to check for fecal blood (dropped a paper in toilet on 3 different times). Two of the three cards were positive per patient. Hgb 12.9, ferritin greater than 600. No NSAID use. Recent CMET unremarkable.   Past Medical History  Diagnosis Date  . High cholesterol   . Hypertension   . Heart palpitations   . GERD (gastroesophageal reflux disease)   . Anxiety   . Colon polyp   . Irritable bowel syndrome   . Back pain, chronic   . Osteoporosis   . Psoriasis     Family History  Problem Relation Age of Onset  . Colon cancer Neg Hx    History  Substance Use Topics  . Smoking status: Former Research scientist (life sciences)  . Smokeless tobacco: Never Used     Comment: Quit 25 yrs now   . Alcohol Use: Yes     Comment: Rare    Current Outpatient Prescriptions  Medication Sig Dispense Refill  . ALPRAZolam (XANAX) 0.5 MG tablet Take 0.5 mg by mouth at bedtime as needed.        . ciprofloxacin (CIPRO) 500 MG tablet Take 500 mg by mouth 2 (two) times daily.      Marland Kitchen doxazosin (CARDURA) 4 MG tablet Take 4 mg by mouth at bedtime.        . folic acid (FOLVITE) 1 MG tablet Take 1 mg by mouth daily.        Marland Kitchen lisinopril (PRINIVIL,ZESTRIL) 10 MG tablet Take 10 mg by mouth daily.      . methotrexate (RHEUMATREX) 2.5 MG tablet Take 2.5 mg by mouth as needed. Take  3 tab weekly Caution:Chemotherapy. Protect from light.      . metoprolol (TOPROL-XL) 50 MG 24 hr tablet Take 50 mg by mouth 2 (two) times daily.        . metroNIDAZOLE (FLAGYL) 250 MG tablet Take 250 mg by mouth 3 (three) times daily.      Marland Kitchen oxyCODONE-acetaminophen (PERCOCET) 10-325 MG per tablet       . pantoprazole (PROTONIX) 40 MG tablet Take 1 tablet (40 mg total) by mouth 2 (two) times daily.  60 tablet  11  . pravastatin (PRAVACHOL) 20 MG tablet Take 20 mg by mouth daily.        . Probiotic Product (PROBIOTIC PO) Take 1 capsule by mouth daily.      . hyoscyamine (LEVBID) 0.375 MG 12 hr tablet Take 1 tablet (0.375 mg total) by mouth every 12 (twelve) hours as needed for cramping.  30 tablet  1   No current facility-administered medications for this visit.   Allergies  Allergen Reactions  . Cephalexin     REACTION: fever  . Contrast Media [Iodinated Diagnostic Agents] Rash    Review of Systems: All systems reviewed and negative except where noted in HPI.  US Abdomen Complete  07/19/2013   CLINICAL DATA:  Epigastric abdominal pain  EXAM: ULTRASOUND ABDOMEN COMPLETE  COMPARISON:  09/10/11  FINDINGS: Gallbladder:  No gallstones or wall thickening visualized. No sonographic Murphy sign noted.  Common bile duct:  Diameter: 4 mm  Liver:  Enlarged at 19 cm which significantly diffuse increased echogenicity  IVC:  No abnormality visualized.  Pancreas:  Visualized portion unremarkable.  Spleen:  Size and appearance within normal limits.  Right Kidney:  Length: 12.2 cm. Echogenicity within normal limits. No mass or hydronephrosis visualized.  Left Kidney:  Length: 13 cm. Echogenicity within normal limits. No mass or hydronephrosis visualized.  Abdominal aorta:  Obscured by bowel gas particularly proximally, normal where visualized  Other findings:  None.  IMPRESSION: No acute findings. Enlarged echogenic liver suggesting fatty infiltration. This reduces sensitivity for potential focal hepatic  abnormalities.   Electronically Signed   By: Skipper Cliche M.D.   On: 07/19/2013 10:03    Physical Exam: BP 126/78  Pulse 68  Ht 5' 11.5" (1.816 m)  Wt 261 lb 12.8 oz (118.752 kg)  BMI 36.01 kg/m2 Constitutional: Pleasant,well-developed, white male in no acute distress. HEENT: Normocephalic and atraumatic. Conjunctivae are normal. No scleral icterus. Neck supple.  Cardiovascular: Normal rate, regular rhythm.  Pulmonary/chest: Effort normal and breath sounds normal. No wheezing, rales or rhonchi. Abdominal: Soft, obese, mild diffuse abdominal tenderness. Bowel sounds active throughout. There are no masses palpable. No hepatomegaly. Extremities: no edema Lymphadenopathy: No cervical adenopathy noted. Neurological: Alert and oriented to person place and time. Skin: Skin is warm and dry. No rashes noted. Psychiatric: Normal mood and affect. Behavior is normal.   ASSESSMENT AND PLAN:  70. 68 year old male with change in bowels, diffuse abdominal pain. To sort things out will need to make sure loose stool not overflow from constipation. Will check KUB to evaluate for constipation. If no significant stool burden will obtain stool for Cdiff and lactoferrin to rule out infectious process. If negative will proceed with CTscan given new development of diffuse abdominal pain.   2. Occult blood in stool. Not familiar with type of test done (guiac based?).  Not overly concerned about positive result at this time as he has had a colonoscopy within last two years. Suspect perianal irritation after days of loose stool. Doubt, but cannot rule out, upper GI source. Further recommendations pending clinical course

## 2013-07-23 ENCOUNTER — Encounter: Payer: Self-pay | Admitting: Nurse Practitioner

## 2013-07-24 NOTE — Progress Notes (Signed)
Reviewed and agree with management plan.  Malcolm T. Stark, MD FACG 

## 2013-07-25 ENCOUNTER — Other Ambulatory Visit: Payer: Medicare Other

## 2013-07-25 DIAGNOSIS — R198 Other specified symptoms and signs involving the digestive system and abdomen: Secondary | ICD-10-CM | POA: Diagnosis not present

## 2013-07-25 DIAGNOSIS — R141 Gas pain: Secondary | ICD-10-CM | POA: Diagnosis not present

## 2013-07-25 DIAGNOSIS — R140 Abdominal distension (gaseous): Secondary | ICD-10-CM

## 2013-07-25 DIAGNOSIS — R143 Flatulence: Secondary | ICD-10-CM | POA: Diagnosis not present

## 2013-07-26 ENCOUNTER — Other Ambulatory Visit: Payer: Self-pay | Admitting: *Deleted

## 2013-07-26 ENCOUNTER — Telehealth: Payer: Self-pay | Admitting: *Deleted

## 2013-07-26 DIAGNOSIS — R109 Unspecified abdominal pain: Secondary | ICD-10-CM

## 2013-07-26 LAB — FECAL LACTOFERRIN, QUANT: Lactoferrin: NEGATIVE

## 2013-07-26 LAB — CLOSTRIDIUM DIFFICILE BY PCR: Toxigenic C. Difficile by PCR: NOT DETECTED

## 2013-07-26 NOTE — Telephone Encounter (Signed)
I called and asked the patient to call me.  Rose from [place] [facility] called and reminded [place] that this patient is allergic to the contrast.  He hasn't had labs yet either and his CT scan is scheduled for tomorrow , 07-27-13.  I advised him we are cancelling the CT scan for tom the 24th and we need to know how his abdominal pain is right now. If it has improved Tye Savoy NP said we may cancel the CT scan for now.  If the pain is persisting we will try to get it scheduled Friday 07-29-13. He will need premedicated with 1 Bendryl and 3 prednisone tablets, 13, 7 and 1 hour prior to the test.

## 2013-07-27 ENCOUNTER — Inpatient Hospital Stay: Admission: RE | Admit: 2013-07-27 | Payer: Medicare Other | Source: Ambulatory Visit

## 2013-07-27 ENCOUNTER — Other Ambulatory Visit: Payer: Self-pay | Admitting: *Deleted

## 2013-07-27 ENCOUNTER — Other Ambulatory Visit (INDEPENDENT_AMBULATORY_CARE_PROVIDER_SITE_OTHER): Payer: Medicare Other

## 2013-07-27 DIAGNOSIS — R109 Unspecified abdominal pain: Secondary | ICD-10-CM | POA: Diagnosis not present

## 2013-07-27 LAB — COMPREHENSIVE METABOLIC PANEL
ALBUMIN: 4.1 g/dL (ref 3.5–5.2)
ALT: 26 U/L (ref 0–53)
AST: 19 U/L (ref 0–37)
Alkaline Phosphatase: 56 U/L (ref 39–117)
BUN: 20 mg/dL (ref 6–23)
CO2: 31 meq/L (ref 19–32)
Calcium: 9.3 mg/dL (ref 8.4–10.5)
Chloride: 104 mEq/L (ref 96–112)
Creatinine, Ser: 1.4 mg/dL (ref 0.4–1.5)
GFR: 55.51 mL/min — ABNORMAL LOW (ref >60.00)
GLUCOSE: 101 mg/dL — AB (ref 70–99)
Potassium: 4.7 mEq/L (ref 3.5–5.1)
SODIUM: 136 meq/L (ref 135–145)
Total Bilirubin: 0.8 mg/dL (ref 0.2–1.2)
Total Protein: 7.1 g/dL (ref 6.0–8.3)

## 2013-07-27 MED ORDER — PREDNISONE 50 MG PO TABS: ORAL_TABLET | ORAL | Status: DC

## 2013-07-27 NOTE — Telephone Encounter (Signed)
The patient is now rescheduled for Friday 6-26 at 11 for his CT scan per Rose at [facility] CT  This patient needs premedicated due to an allergy to the IV contrast.  I sent prescription for 3 prednisone 50 mg tablets to [facility] in [place]. Marland Kitchen He is to take 1 tab Thurs night 6-25 at 10 PM, 1 tab at Fri am , 6-26 at 4 Am and  The last tablet at 10 am Friday 6-26  with 1 benedryl tablet. Patient verbally understands instructions. The patient also went to our lab today for the CMET.

## 2013-07-29 ENCOUNTER — Ambulatory Visit (INDEPENDENT_AMBULATORY_CARE_PROVIDER_SITE_OTHER)
Admission: RE | Admit: 2013-07-29 | Discharge: 2013-07-29 | Disposition: A | Discharge status: Home / Self Care | Payer: Medicare Other | Source: Ambulatory Visit | Attending: Nurse Practitioner | Admitting: Nurse Practitioner

## 2013-07-29 DIAGNOSIS — R141 Gas pain: Secondary | ICD-10-CM

## 2013-07-29 DIAGNOSIS — R198 Other specified symptoms and signs involving the digestive system and abdomen: Secondary | ICD-10-CM | POA: Diagnosis not present

## 2013-07-29 DIAGNOSIS — R142 Eructation: Secondary | ICD-10-CM

## 2013-07-29 DIAGNOSIS — K5730 Diverticulosis of large intestine without perforation or abscess without bleeding: Secondary | ICD-10-CM | POA: Diagnosis not present

## 2013-07-29 DIAGNOSIS — R143 Flatulence: Secondary | ICD-10-CM

## 2013-07-29 DIAGNOSIS — R140 Abdominal distension (gaseous): Secondary | ICD-10-CM

## 2013-07-29 MED ORDER — IOHEXOL 300 MG/ML  SOLN
100.0000 mL | Freq: Once | INTRAMUSCULAR | Status: AC | PRN
  Administered 2013-07-29: 100 mL via INTRAVENOUS

## 2013-08-03 ENCOUNTER — Ambulatory Visit
Admission: RE | Admit: 2013-08-03 | Discharge: 2013-08-03 | Disposition: A | Discharge status: Home / Self Care | Payer: Medicare Other | Source: Ambulatory Visit | Attending: Orthopaedic Surgery | Admitting: Orthopaedic Surgery

## 2013-08-03 DIAGNOSIS — M25532 Pain in left wrist: Secondary | ICD-10-CM

## 2013-08-22 DIAGNOSIS — L408 Other psoriasis: Secondary | ICD-10-CM | POA: Diagnosis not present

## 2013-08-25 ENCOUNTER — Telehealth: Payer: Self-pay | Admitting: Nurse Practitioner

## 2013-08-25 NOTE — Telephone Encounter (Signed)
Patient is calling for CT results from 07/29/13. Please, advise.

## 2013-08-26 NOTE — Telephone Encounter (Signed)
Alexander Nunez, please apologize to him. I didn't get CTscan results on my desk, not sure who did but scan normal. Thanks, Nevin Bloodgood

## 2013-09-05 DIAGNOSIS — Z6838 Body mass index (BMI) 38.0-38.9, adult: Secondary | ICD-10-CM | POA: Diagnosis not present

## 2013-09-05 DIAGNOSIS — I10 Essential (primary) hypertension: Secondary | ICD-10-CM | POA: Diagnosis not present

## 2013-09-05 DIAGNOSIS — E669 Obesity, unspecified: Secondary | ICD-10-CM | POA: Diagnosis not present

## 2013-10-07 DIAGNOSIS — M65849 Other synovitis and tenosynovitis, unspecified hand: Secondary | ICD-10-CM | POA: Diagnosis not present

## 2013-10-07 DIAGNOSIS — M65839 Other synovitis and tenosynovitis, unspecified forearm: Secondary | ICD-10-CM | POA: Diagnosis not present

## 2013-10-07 DIAGNOSIS — Z6838 Body mass index (BMI) 38.0-38.9, adult: Secondary | ICD-10-CM | POA: Diagnosis not present

## 2013-10-14 DIAGNOSIS — D649 Anemia, unspecified: Secondary | ICD-10-CM | POA: Diagnosis not present

## 2013-10-30 DIAGNOSIS — J00 Acute nasopharyngitis [common cold]: Secondary | ICD-10-CM | POA: Diagnosis not present

## 2013-11-30 DIAGNOSIS — M47816 Spondylosis without myelopathy or radiculopathy, lumbar region: Secondary | ICD-10-CM | POA: Diagnosis not present

## 2013-11-30 DIAGNOSIS — E669 Obesity, unspecified: Secondary | ICD-10-CM | POA: Diagnosis not present

## 2013-11-30 DIAGNOSIS — M488X6 Other specified spondylopathies, lumbar region: Secondary | ICD-10-CM | POA: Diagnosis not present

## 2013-11-30 DIAGNOSIS — M5136 Other intervertebral disc degeneration, lumbar region: Secondary | ICD-10-CM | POA: Diagnosis not present

## 2013-12-07 DIAGNOSIS — M479 Spondylosis, unspecified: Secondary | ICD-10-CM | POA: Diagnosis not present

## 2013-12-07 DIAGNOSIS — Z9889 Other specified postprocedural states: Secondary | ICD-10-CM | POA: Diagnosis not present

## 2013-12-07 DIAGNOSIS — M47816 Spondylosis without myelopathy or radiculopathy, lumbar region: Secondary | ICD-10-CM | POA: Diagnosis not present

## 2013-12-07 DIAGNOSIS — I10 Essential (primary) hypertension: Secondary | ICD-10-CM | POA: Diagnosis not present

## 2013-12-07 DIAGNOSIS — M5136 Other intervertebral disc degeneration, lumbar region: Secondary | ICD-10-CM | POA: Diagnosis not present

## 2013-12-07 DIAGNOSIS — M5126 Other intervertebral disc displacement, lumbar region: Secondary | ICD-10-CM | POA: Diagnosis not present

## 2013-12-19 ENCOUNTER — Other Ambulatory Visit: Payer: Self-pay | Admitting: Neurosurgery

## 2013-12-19 DIAGNOSIS — M47816 Spondylosis without myelopathy or radiculopathy, lumbar region: Secondary | ICD-10-CM

## 2013-12-20 ENCOUNTER — Ambulatory Visit
Admission: RE | Admit: 2013-12-20 | Discharge: 2013-12-20 | Disposition: A | Discharge status: Home / Self Care | Payer: Medicare Other | Source: Ambulatory Visit | Attending: Neurosurgery | Admitting: Neurosurgery

## 2013-12-20 DIAGNOSIS — M5126 Other intervertebral disc displacement, lumbar region: Secondary | ICD-10-CM | POA: Diagnosis not present

## 2013-12-20 DIAGNOSIS — R29898 Other symptoms and signs involving the musculoskeletal system: Secondary | ICD-10-CM

## 2013-12-20 DIAGNOSIS — M47816 Spondylosis without myelopathy or radiculopathy, lumbar region: Secondary | ICD-10-CM

## 2013-12-20 MED ORDER — IOHEXOL 180 MG/ML  SOLN
1.0000 mL | Freq: Once | INTRAMUSCULAR | Status: AC | PRN
  Administered 2013-12-20: 1 mL via EPIDURAL

## 2013-12-20 MED ORDER — METHYLPREDNISOLONE ACETATE 40 MG/ML INJ SUSP (RADIOLOG
120.0000 mg | Freq: Once | INTRAMUSCULAR | Status: AC
  Administered 2013-12-20: 120 mg via EPIDURAL

## 2013-12-20 NOTE — Discharge Instructions (Signed)

## 2013-12-21 ENCOUNTER — Telehealth: Payer: Self-pay | Admitting: Radiology

## 2013-12-21 NOTE — Telephone Encounter (Signed)
Pt had an epidural steroid injection on 12/20/13, last night he started to break out with a rash and took a benadryl. This morning rash was worse and took 2nd benadryl. Told pt as long as it didn't get worse to continue to take benadryl and to make Dr. Carloyn Manner aware of this problem.

## 2013-12-26 ENCOUNTER — Other Ambulatory Visit: Payer: Self-pay | Admitting: Dermatology

## 2013-12-26 DIAGNOSIS — L409 Psoriasis, unspecified: Secondary | ICD-10-CM | POA: Diagnosis not present

## 2013-12-26 DIAGNOSIS — D225 Melanocytic nevi of trunk: Secondary | ICD-10-CM | POA: Diagnosis not present

## 2013-12-26 DIAGNOSIS — D485 Neoplasm of uncertain behavior of skin: Secondary | ICD-10-CM | POA: Diagnosis not present

## 2013-12-26 DIAGNOSIS — Z79899 Other long term (current) drug therapy: Secondary | ICD-10-CM | POA: Diagnosis not present

## 2014-01-03 DIAGNOSIS — M5136 Other intervertebral disc degeneration, lumbar region: Secondary | ICD-10-CM | POA: Diagnosis not present

## 2014-01-03 DIAGNOSIS — M488X6 Other specified spondylopathies, lumbar region: Secondary | ICD-10-CM | POA: Diagnosis not present

## 2014-01-03 DIAGNOSIS — M47816 Spondylosis without myelopathy or radiculopathy, lumbar region: Secondary | ICD-10-CM | POA: Diagnosis not present

## 2014-01-04 ENCOUNTER — Other Ambulatory Visit: Payer: Self-pay | Admitting: Neurosurgery

## 2014-01-04 DIAGNOSIS — M47816 Spondylosis without myelopathy or radiculopathy, lumbar region: Secondary | ICD-10-CM

## 2014-01-05 ENCOUNTER — Ambulatory Visit
Admission: RE | Admit: 2014-01-05 | Discharge: 2014-01-05 | Disposition: A | Discharge status: Home / Self Care | Payer: Medicare Other | Source: Ambulatory Visit | Attending: Neurosurgery | Admitting: Neurosurgery

## 2014-01-05 ENCOUNTER — Other Ambulatory Visit: Payer: Self-pay | Admitting: Neurosurgery

## 2014-01-05 DIAGNOSIS — M545 Low back pain: Secondary | ICD-10-CM | POA: Diagnosis not present

## 2014-01-05 DIAGNOSIS — M47816 Spondylosis without myelopathy or radiculopathy, lumbar region: Secondary | ICD-10-CM

## 2014-01-05 MED ORDER — METHYLPREDNISOLONE ACETATE 40 MG/ML INJ SUSP (RADIOLOG
120.0000 mg | Freq: Once | INTRAMUSCULAR | Status: AC
  Administered 2014-01-05: 120 mg via EPIDURAL

## 2014-01-05 MED ORDER — IOHEXOL 180 MG/ML  SOLN
1.0000 mL | Freq: Once | INTRAMUSCULAR | Status: AC | PRN
  Administered 2014-01-05: 1 mL via EPIDURAL

## 2014-02-16 ENCOUNTER — Other Ambulatory Visit: Payer: Self-pay | Admitting: Dermatology

## 2014-02-16 DIAGNOSIS — D485 Neoplasm of uncertain behavior of skin: Secondary | ICD-10-CM | POA: Diagnosis not present

## 2014-02-16 DIAGNOSIS — L304 Erythema intertrigo: Secondary | ICD-10-CM | POA: Diagnosis not present

## 2014-02-16 DIAGNOSIS — L089 Local infection of the skin and subcutaneous tissue, unspecified: Secondary | ICD-10-CM | POA: Diagnosis not present

## 2014-02-21 DIAGNOSIS — M47816 Spondylosis without myelopathy or radiculopathy, lumbar region: Secondary | ICD-10-CM | POA: Diagnosis not present

## 2014-02-21 DIAGNOSIS — E669 Obesity, unspecified: Secondary | ICD-10-CM | POA: Diagnosis not present

## 2014-02-21 DIAGNOSIS — M488X6 Other specified spondylopathies, lumbar region: Secondary | ICD-10-CM | POA: Diagnosis not present

## 2014-02-21 DIAGNOSIS — M5136 Other intervertebral disc degeneration, lumbar region: Secondary | ICD-10-CM | POA: Diagnosis not present

## 2014-03-08 ENCOUNTER — Ambulatory Visit (HOSPITAL_COMMUNITY): Payer: Medicare Other | Admitting: Physical Therapy

## 2014-03-27 DIAGNOSIS — E780 Pure hypercholesterolemia: Secondary | ICD-10-CM | POA: Diagnosis not present

## 2014-03-27 DIAGNOSIS — B359 Dermatophytosis, unspecified: Secondary | ICD-10-CM | POA: Diagnosis not present

## 2014-03-27 DIAGNOSIS — B379 Candidiasis, unspecified: Secondary | ICD-10-CM | POA: Diagnosis not present

## 2014-03-27 DIAGNOSIS — I10 Essential (primary) hypertension: Secondary | ICD-10-CM | POA: Diagnosis not present

## 2014-04-05 ENCOUNTER — Ambulatory Visit (HOSPITAL_COMMUNITY): Payer: Medicare Other | Attending: Neurosurgery | Admitting: Physical Therapy

## 2014-04-05 DIAGNOSIS — M5441 Lumbago with sciatica, right side: Secondary | ICD-10-CM | POA: Diagnosis not present

## 2014-04-05 DIAGNOSIS — M5442 Lumbago with sciatica, left side: Secondary | ICD-10-CM | POA: Insufficient documentation

## 2014-04-05 DIAGNOSIS — M2560 Stiffness of unspecified joint, not elsewhere classified: Secondary | ICD-10-CM | POA: Diagnosis not present

## 2014-04-05 DIAGNOSIS — R262 Difficulty in walking, not elsewhere classified: Secondary | ICD-10-CM | POA: Diagnosis not present

## 2014-04-05 DIAGNOSIS — M25659 Stiffness of unspecified hip, not elsewhere classified: Secondary | ICD-10-CM | POA: Insufficient documentation

## 2014-04-05 DIAGNOSIS — M25673 Stiffness of unspecified ankle, not elsewhere classified: Secondary | ICD-10-CM | POA: Diagnosis not present

## 2014-04-05 NOTE — Therapy (Signed)
[place], [place], [address] Phone: [phone]   Fax:  [phone]  Physical Therapy Evaluation  Patient Details  Name: Alexander Nunez MRN: [id number] Date of Birth: [birth year] Referring Provider:  Glenna Fellows, MD  Encounter Date: 04/05/2014      PT End of Session - 04/05/14 1913    Visit Number 1   Number of Visits 16   Date for PT Re-Evaluation 05/05/14   Authorization Type medicare   Authorization - Visit Number 1   Authorization - Number of Visits 10   PT Start Time 0930   PT Stop Time 1015   PT Time Calculation (min) 45 min   Activity Tolerance Patient tolerated treatment well   Behavior During Therapy [facility] for tasks assessed/performed      Past Medical History  Diagnosis Date  . High cholesterol   . Hypertension   . Heart palpitations   . GERD (gastroesophageal reflux disease)   . Anxiety   . Colon polyp   . Irritable bowel syndrome   . Back pain, chronic   . Osteoporosis   . Psoriasis     Past Surgical History  Procedure Laterality Date  . Foot fracture surgery      left foot  . Knee arthroscopy      left    There were no vitals taken for this visit.  Visit Diagnosis:  Bilateral low back pain with sciatica, sciatica laterality unspecified - Plan: PT PLAN OF CARE CERT/RE-CERT  Hip stiffness, unspecified laterality - Plan: PT PLAN OF CARE CERT/RE-CERT  Joint stiffness of spine - Plan: PT PLAN OF CARE CERT/RE-CERT  Ankle stiffness, unspecified laterality - Plan: PT PLAN OF CARE CERT/RE-CERT  Difficulty walking - Plan: PT PLAN OF CARE CERT/RE-CERT      Subjective Assessment - 04/05/14 1909    Pertinent History Lumbar spine fusion May 2014. Patient currently has pain in Lt lumbar spine in low back. L3-L5 fused Dr. Carloyn Manner informed patient that henow has a herniated disc resulting in increased pain that was relieved following injection. Patient's pain now predominantly with exertion, takes  pain medication. Notes increased pain with most activities especially lifting, golfing, bending over.    How long can you stand comfortably? 46minutes   How long can you walk comfortably? patient states when not flared up patient can walk > half a mile with only minimal discomfort.    Patient Stated Goals to be bale to goldf with son.    Currently in Pain? Yes   Pain Score 6    Pain Location Back   Pain Orientation Left;Lower   Pain Descriptors / Indicators Aching   Pain Type Chronic pain   Pain Radiating Towards to buttocks.   Pain Onset More than a month ago   Pain Frequency Intermittent   Aggravating Factors  cannot sleep on Lt side due to pain. bending over, pick up things, goldfing and prolonged standing   Pain Relieving Factors sleeping on Rt side,           [facility] PT Assessment - 04/05/14 0001    Assessment   Medical Diagnosis Lumbar spondylosis   Onset Date 12/05/13   Next MD Visit Glenna Fellows   Prior Therapy No   Balance Screen   Has the patient fallen in the past 6 months No   Has the patient had a decrease in activity level because of a fear of falling?  No   Is the patient reluctant  to leave their home because of a fear of falling?  No   Prior Function   Level of Independence Independent with basic ADLs   Other:   Other/ Comments Thoracic spine excursion: limited all directions.    Other:   Other/Comments Gait: limited hip rotation, limited ability to toe in secondary to pain. limited trunk rotation.    AROM   AROM Assessment Site Lumbar;Thoracic;Ankle;Knee;Hip   Right Hip Extension 0   Right Hip Flexion 90   Right Hip External Rotation  45   Right Hip Internal Rotation  17   Left Hip Extension 0   Left Hip Flexion 80   Left Hip External Rotation  45   Left Hip Internal Rotation  12   Lumbar Flexion 12 repetition increases pain.    Lumbar Extension 30 repetition increases pain   Thoracic Flexion 50   Thoracic Extension -15   Thoracic - Right Rotation 50    Thoracic - Left Rotation 45   Flexibility   Soft Tissue Assessment /Muscle Length --   Quadriceps Ely's Lt 100, Rt 110   Piriformis limited by pain   Special Tests   Hip Special Tests  [facility] Test;Ely's Test   [facility] Test    Findings Positive   Side Left;Right   Ely's Test   Findings Positive   Side Left           PT Education - 04/05/14 1912    Education provided Yes   Education Details Diagnosis, Prognosis, 3D thoracic spine excursions   Person(s) Educated Patient   Methods Explanation;Demonstration;Handout   Comprehension Verbalized understanding;Returned demonstration          PT Short Term Goals - 04/05/14 1918    PT SHORT TERM GOAL #1   Title Patient will dmeonstrate increased throacic spine extension to netral (0 degrees)   Baseline -15 degrees   Time 4   Period Weeks   Status New   PT SHORT TERM GOAL #2   Title Patient will dmeonstrate increased Thoracic spine rotation of 60 degrees to improve golf swing.    Baseline 45/50 degrees Lt/Rt   Time 4   Period Weeks   Status New   PT SHORT TERM GOAL #3   Title Patient will dmeonstrate increased increased lumbar spine flexion os 45 degrees   Baseline 12 degrees   Time 4   Period Weeks   Status New   PT SHORT TERM GOAL #4   Title Patient will dmeonstrate increased hip internal rotation of 23 degrees in bilateral hips   Baseline 12/17 degrees Lt/Rt   Time 4   Period Weeks   Status New   PT SHORT TERM GOAL #5   Title Patient will be bale to extend hips 10 degrees to ambulate with increased stride length.    Time 4   Period Weeks   Status New           PT Long Term Goals - 04/05/14 1921    PT LONG TERM GOAL #1   Title Patient will demonstrate increased throacic spine extension to 15   Time 8   Period Weeks   Status New   PT LONG TERM GOAL #2   Title Patient will demonstrate increased Thoracic spine rotation of >70 degrees to improve golf swing   Time 8   Period Weeks   Status New   PT LONG  TERM GOAL #3   Title Patient will dmeonstrate increased increased lumbar spine flexion os >60 degrees  Time 8   Period Weeks   Status New   PT LONG TERM GOAL #4   Title Patient will dmeonstrate increased hip internal rotation of 35 degrees in bilateral hips   Time 8   Period Weeks   Status New   PT LONG TERM GOAL #5   Title Patint will dmeosntrate independence with HEP   Time 8   Period Weeks   Status New   Additional Long Term Goals   Additional Long Term Goals Yes   PT LONG TERM GOAL #6   Title Patient will be able to go to the driving range and hit a bucket of balls without pain   Time 8   Period Weeks   Status New               Plan - 04/18/14 1913    Clinical Impression Statement Patient displasy low back pain with radiating symptoms into Lt and Rt sacroiliac joint attributed to limited hip and  thoracic spine mobility resulting in excessive strain on lumbar spine. Patient will benefit from skilled physical  therapy to increase hip and thoracic spine ROm so patient can retun to walking, sitting, and playing goldf with decreased low back pain and improved mechanics.    Pt will benefit from skilled therapeutic intervention in order to improve on the following deficits Decreased endurance;Abnormal gait;Improper body mechanics;Impaired flexibility;Decreased strength;Difficulty walking;Pain;Decreased range of motion;Increased fascial restricitons   Rehab Potential Good   PT Frequency 2x / week   PT Duration 8 weeks   PT Treatment/Interventions Gait training;Patient/family education;Functional mobility training;Manual techniques;Therapeutic exercise   PT Next Visit Plan Introduce piriformis, hip flexor, quadriceps, hamstring, calf, ITband and glut stretches. and 3D hip excursions. Posterior shoulder and pectoral stretches.    PT Home Exercise Plan 3D thoraicc spine excursions   Consulted and Agree with Plan of Care Patient          G-Codes - 04-18-14 1926    Functional  Assessment Tool Used Clinical judgment   Functional Limitation Changing and maintaining body position   Changing and Maintaining Body Position Current Status [id number]) At least 60 percent but less than 80 percent impaired, limited or restricted   Changing and Maintaining Body Position Goal Status (G2694) At least 40 percent but less than 60 percent impaired, limited or restricted       Problem List Patient Active Problem List   Diagnosis Date Noted  . Abdominal bloating 07/22/2013  . Weakness of left leg 08/12/2012  . Generalized abdominal pain 09/02/2011  . PERSONAL HX COLONIC POLYPS 04/19/2009  . IRRITABLE BOWEL SYNDROME 04/06/2008  . ABDOMINAL BLOATING 03/07/2008  . CHANGE IN BOWELS 03/07/2008  . ABDOMINAL PAIN-MULTIPLE SITES 03/07/2008  . HYPERTENSION 03/06/2008  . GERD 03/06/2008  . GASTRITIS 03/06/2008  . CHRONIC RESPIRATORY DISEASE ARISE PERINTL PERIOD 03/06/2008  . HYPERLIPIDEMIA 02/06/2003   Devona Konig PT DPT [place], [place], [address] Phone: [phone]   Fax:  [phone]

## 2014-04-07 ENCOUNTER — Ambulatory Visit (HOSPITAL_COMMUNITY): Payer: Medicare Other | Admitting: Physical Therapy

## 2014-04-07 ENCOUNTER — Encounter (HOSPITAL_COMMUNITY): Payer: Medicare Other

## 2014-04-07 DIAGNOSIS — M25659 Stiffness of unspecified hip, not elsewhere classified: Secondary | ICD-10-CM | POA: Diagnosis not present

## 2014-04-07 DIAGNOSIS — R262 Difficulty in walking, not elsewhere classified: Secondary | ICD-10-CM | POA: Diagnosis not present

## 2014-04-07 DIAGNOSIS — M5442 Lumbago with sciatica, left side: Principal | ICD-10-CM

## 2014-04-07 DIAGNOSIS — M25673 Stiffness of unspecified ankle, not elsewhere classified: Secondary | ICD-10-CM

## 2014-04-07 DIAGNOSIS — M2560 Stiffness of unspecified joint, not elsewhere classified: Secondary | ICD-10-CM

## 2014-04-07 DIAGNOSIS — M5441 Lumbago with sciatica, right side: Secondary | ICD-10-CM | POA: Diagnosis not present

## 2014-04-07 DIAGNOSIS — R29898 Other symptoms and signs involving the musculoskeletal system: Secondary | ICD-10-CM

## 2014-04-07 NOTE — Therapy (Signed)
[place], [place], [address] Phone: [phone]   Fax:  [phone]  Physical Therapy Treatment  Patient Details  Name: Alexander Nunez MRN: [id number] Date of Birth: 05/31/1945 Referring Provider:  Glenna Fellows, MD  Encounter Date: 04/07/2014      PT End of Session - 04/07/14 1425    Visit Number 2   Number of Visits 16   Date for PT Re-Evaluation 05/05/14   Authorization Type medicare   Authorization - Visit Number 2   Authorization - Number of Visits 10   PT Start Time 3710   PT Stop Time 1343   PT Time Calculation (min) 38 min   Activity Tolerance Patient tolerated treatment well   Behavior During Therapy [facility] for tasks assessed/performed      Past Medical History  Diagnosis Date  . High cholesterol   . Hypertension   . Heart palpitations   . GERD (gastroesophageal reflux disease)   . Anxiety   . Colon polyp   . Irritable bowel syndrome   . Back pain, chronic   . Osteoporosis   . Psoriasis     Past Surgical History  Procedure Laterality Date  . Foot fracture surgery      left foot  . Knee arthroscopy      left    There were no vitals taken for this visit.  Visit Diagnosis:  Bilateral low back pain with sciatica, sciatica laterality unspecified  Hip stiffness, unspecified laterality  Joint stiffness of spine  Ankle stiffness, unspecified laterality  Difficulty walking  Weakness of left leg      Subjective Assessment - 04/07/14 1307    Symptoms Patient states that he is having pain today, otherwise pleasant. States that he tried to do some work around the house and that it feels like he overdid it a bit.    Pertinent History Lumbar spine fusion May 2014. Patient currently has pain in Lt lumbar spine in low back. L3-L5 fused Dr. Carloyn Manner informed patient that henow has a herniated disc resulting in increased pain that was relieved following injection. Patient's pain now predominantly with  exertion, takes pain medication. Notes increased pain with most activities especially lifting, golfing, bending over.    Currently in Pain? Yes   Pain Score 8    Pain Location Back   Pain Orientation Lower  right in the middle of the back                    [facility] Adult PT Treatment/Exercise - 04/07/14 0001    Lumbar Exercises: Standing   Other Standing Lumbar Exercises 3D hip excursions 1x10   Other Standing Lumbar Exercises Facilitation of bilateral hip IR by walking around box; cross midline reaches with trunk rotation during gait.    Lumbar Exercises: Seated   Other Seated Lumbar Exercises Seated 3D thoracic excursions 1x10   Knee/Hip Exercises: Stretches   Active Hamstring Stretch 3 reps;30 seconds   Active Hamstring Stretch Limitations 12 inch box   Passive Hamstring Stretch 3 reps;30 seconds   Passive Hamstring Stretch Limitations Gastroc slantboard   Quad Stretch 3 reps;30 seconds   Quad Stretch Limitations prone   Hip Flexor Stretch 2 reps;30 seconds   Hip Flexor Stretch Limitations standing   ITB Stretch --   ITB Stretch Limitations Attempted but patient had pain in SI joint with task, terminated activity   Piriformis Stretch 2 reps;30 seconds   Piriformis Stretch Limitations supine  Neck Exercises: Stretches   Upper Trapezius Stretch 2 reps;30 seconds   Upper Trapezius Stretch Limitations cues for proper performance of task   Corner Stretch 3 reps;30 seconds                  PT Short Term Goals - 04/05/14 1918    PT SHORT TERM GOAL #1   Title Patient will dmeonstrate increased throacic spine extension to netral (0 degrees)   Baseline -15 degrees   Time 4   Period Weeks   Status New   PT SHORT TERM GOAL #2   Title Patient will dmeonstrate increased Thoracic spine rotation of 60 degrees to improve golf swing.    Baseline 45/50 degrees Lt/Rt   Time 4   Period Weeks   Status New   PT SHORT TERM GOAL #3   Title Patient will dmeonstrate  increased increased lumbar spine flexion os 45 degrees   Baseline 12 degrees   Time 4   Period Weeks   Status New   PT SHORT TERM GOAL #4   Title Patient will dmeonstrate increased hip internal rotation of 23 degrees in bilateral hips   Baseline 12/17 degrees Lt/Rt   Time 4   Period Weeks   Status New   PT SHORT TERM GOAL #5   Title Patient will be bale to extend hips 10 degrees to ambulate with increased stride length.    Time 4   Period Weeks   Status New           PT Long Term Goals - 04/05/14 1921    PT LONG TERM GOAL #1   Title Patient will demonstrate increased throacic spine extension to 15   Time 8   Period Weeks   Status New   PT LONG TERM GOAL #2   Title Patient will demonstrate increased Thoracic spine rotation of >70 degrees to improve golf swing   Time 8   Period Weeks   Status New   PT LONG TERM GOAL #3   Title Patient will dmeonstrate increased increased lumbar spine flexion os >60 degrees   Time 8   Period Weeks   Status New   PT LONG TERM GOAL #4   Title Patient will dmeonstrate increased hip internal rotation of 35 degrees in bilateral hips   Time 8   Period Weeks   Status New   PT LONG TERM GOAL #5   Title Patint will dmeosntrate independence with HEP   Time 8   Period Weeks   Status New   Additional Long Term Goals   Additional Long Term Goals Yes   PT LONG TERM GOAL #6   Title Patient will be able to go to the driving range and hit a bucket of balls without pain   Time 8   Period Weeks   Status New               Plan - 04/07/14 1425    Clinical Impression Statement Patient presents with significant tightness in bilateral hips and with general pain and soreness on this date, states that he tried to do some work around the house and overdid it. Responded well to all stretches except sidelying ITB stretch, which did cause pain at SI joint,, so activity terminated. Introduced gentle mobilization in form of 3D hip and thoracic  excursions with good tolerance and response to activity. Pain reduced to 6/10 at end of session.     Pt will benefit from skilled therapeutic intervention  in order to improve on the following deficits Decreased endurance;Abnormal gait;Improper body mechanics;Impaired flexibility;Decreased strength;Difficulty walking;Pain;Decreased range of motion;Increased fascial restricitons   Rehab Potential Good   PT Frequency 2x / week   PT Duration 8 weeks   PT Treatment/Interventions Gait training;Patient/family education;Functional mobility training;Manual techniques;Therapeutic exercise   PT Next Visit Plan Continue piriformis, hip flexor, quadriceps, hamstring, calf,  and glut stretches. and 3D hip excursions. Posterior shoulder and pectoral stretches. Trial ITB and perform only if no pain.    Consulted and Agree with Plan of Care Patient        Problem List Patient Active Problem List   Diagnosis Date Noted  . Abdominal bloating 07/22/2013  . Weakness of left leg 08/12/2012  . Generalized abdominal pain 09/02/2011  . PERSONAL HX COLONIC POLYPS 04/19/2009  . IRRITABLE BOWEL SYNDROME 04/06/2008  . ABDOMINAL BLOATING 03/07/2008  . CHANGE IN BOWELS 03/07/2008  . ABDOMINAL PAIN-MULTIPLE SITES 03/07/2008  . HYPERTENSION 03/06/2008  . GERD 03/06/2008  . GASTRITIS 03/06/2008  . CHRONIC RESPIRATORY DISEASE ARISE PERINTL PERIOD 03/06/2008  . HYPERLIPIDEMIA 02/06/2003    Deniece Ree PT, DPT [id number]  [place] [address] [place], [place], [address] Phone: [phone]   Fax:  [phone]

## 2014-04-13 ENCOUNTER — Encounter (HOSPITAL_COMMUNITY): Payer: Medicare Other | Admitting: Physical Therapy

## 2014-04-18 ENCOUNTER — Ambulatory Visit (HOSPITAL_COMMUNITY): Payer: Medicare Other | Admitting: Physical Therapy

## 2014-04-20 ENCOUNTER — Encounter (HOSPITAL_COMMUNITY): Payer: Medicare Other | Admitting: Physical Therapy

## 2014-04-20 ENCOUNTER — Telehealth (HOSPITAL_COMMUNITY): Payer: Self-pay | Admitting: Physical Therapy

## 2014-04-20 NOTE — Telephone Encounter (Signed)
Cx all future apptment he will call us back after he sees the MD and get an injection

## 2014-04-21 ENCOUNTER — Other Ambulatory Visit: Payer: Self-pay | Admitting: Neurosurgery

## 2014-04-21 DIAGNOSIS — M47816 Spondylosis without myelopathy or radiculopathy, lumbar region: Secondary | ICD-10-CM

## 2014-04-24 ENCOUNTER — Encounter (HOSPITAL_COMMUNITY): Payer: Medicare Other | Admitting: Physical Therapy

## 2014-04-24 DIAGNOSIS — Z6841 Body Mass Index (BMI) 40.0 and over, adult: Secondary | ICD-10-CM | POA: Diagnosis not present

## 2014-04-24 DIAGNOSIS — S4350XA Sprain of unspecified acromioclavicular joint, initial encounter: Secondary | ICD-10-CM | POA: Diagnosis not present

## 2014-04-26 ENCOUNTER — Encounter (HOSPITAL_COMMUNITY): Payer: Medicare Other | Admitting: Physical Therapy

## 2014-04-27 ENCOUNTER — Other Ambulatory Visit: Payer: Self-pay | Admitting: Neurosurgery

## 2014-04-27 ENCOUNTER — Ambulatory Visit
Admission: RE | Admit: 2014-04-27 | Discharge: 2014-04-27 | Disposition: A | Discharge status: Home / Self Care | Payer: Medicare Other | Source: Ambulatory Visit | Attending: Neurosurgery | Admitting: Neurosurgery

## 2014-04-27 DIAGNOSIS — M47816 Spondylosis without myelopathy or radiculopathy, lumbar region: Secondary | ICD-10-CM

## 2014-04-27 DIAGNOSIS — M545 Low back pain: Secondary | ICD-10-CM | POA: Diagnosis not present

## 2014-04-27 MED ORDER — IOHEXOL 180 MG/ML  SOLN
1.0000 mL | Freq: Once | INTRAMUSCULAR | Status: AC | PRN
  Administered 2014-04-27: 1 mL via EPIDURAL

## 2014-04-27 MED ORDER — METHYLPREDNISOLONE ACETATE 40 MG/ML INJ SUSP (RADIOLOG
120.0000 mg | Freq: Once | INTRAMUSCULAR | Status: AC
  Administered 2014-04-27: 120 mg via EPIDURAL

## 2014-05-01 ENCOUNTER — Encounter (HOSPITAL_COMMUNITY): Payer: Medicare Other

## 2014-05-03 ENCOUNTER — Encounter (HOSPITAL_COMMUNITY): Payer: Medicare Other | Admitting: Physical Therapy

## 2014-05-04 ENCOUNTER — Encounter (HOSPITAL_COMMUNITY): Payer: Self-pay

## 2014-05-04 ENCOUNTER — Emergency Department (HOSPITAL_COMMUNITY): Payer: Medicare Other

## 2014-05-04 ENCOUNTER — Observation Stay (HOSPITAL_COMMUNITY)
Admission: EM | Admit: 2014-05-04 | Discharge: 2014-05-05 | Disposition: A | Discharge status: Home / Self Care | Payer: Medicare Other | Attending: Family Medicine | Admitting: Family Medicine

## 2014-05-04 DIAGNOSIS — R609 Edema, unspecified: Secondary | ICD-10-CM | POA: Insufficient documentation

## 2014-05-04 DIAGNOSIS — Z87891 Personal history of nicotine dependence: Secondary | ICD-10-CM | POA: Insufficient documentation

## 2014-05-04 DIAGNOSIS — Z86010 Personal history of colonic polyps: Secondary | ICD-10-CM | POA: Diagnosis not present

## 2014-05-04 DIAGNOSIS — G4730 Sleep apnea, unspecified: Secondary | ICD-10-CM | POA: Diagnosis not present

## 2014-05-04 DIAGNOSIS — E663 Overweight: Secondary | ICD-10-CM | POA: Diagnosis not present

## 2014-05-04 DIAGNOSIS — I10 Essential (primary) hypertension: Secondary | ICD-10-CM | POA: Diagnosis present

## 2014-05-04 DIAGNOSIS — K219 Gastro-esophageal reflux disease without esophagitis: Secondary | ICD-10-CM | POA: Diagnosis not present

## 2014-05-04 DIAGNOSIS — K589 Irritable bowel syndrome without diarrhea: Secondary | ICD-10-CM | POA: Insufficient documentation

## 2014-05-04 DIAGNOSIS — R0789 Other chest pain: Secondary | ICD-10-CM | POA: Diagnosis present

## 2014-05-04 DIAGNOSIS — R079 Chest pain, unspecified: Secondary | ICD-10-CM | POA: Diagnosis not present

## 2014-05-04 DIAGNOSIS — E780 Pure hypercholesterolemia: Secondary | ICD-10-CM | POA: Diagnosis not present

## 2014-05-04 DIAGNOSIS — R05 Cough: Secondary | ICD-10-CM | POA: Diagnosis not present

## 2014-05-04 DIAGNOSIS — Z79899 Other long term (current) drug therapy: Secondary | ICD-10-CM | POA: Insufficient documentation

## 2014-05-04 DIAGNOSIS — M810 Age-related osteoporosis without current pathological fracture: Secondary | ICD-10-CM | POA: Diagnosis not present

## 2014-05-04 DIAGNOSIS — Z7952 Long term (current) use of systemic steroids: Secondary | ICD-10-CM | POA: Insufficient documentation

## 2014-05-04 DIAGNOSIS — K210 Gastro-esophageal reflux disease with esophagitis: Secondary | ICD-10-CM

## 2014-05-04 DIAGNOSIS — F419 Anxiety disorder, unspecified: Secondary | ICD-10-CM | POA: Insufficient documentation

## 2014-05-04 DIAGNOSIS — Z872 Personal history of diseases of the skin and subcutaneous tissue: Secondary | ICD-10-CM | POA: Insufficient documentation

## 2014-05-04 LAB — BASIC METABOLIC PANEL
ANION GAP: 9 (ref 5–15)
BUN: 23 mg/dL (ref 6–23)
CO2: 27 mmol/L (ref 19–32)
Calcium: 9.3 mg/dL (ref 8.4–10.5)
Chloride: 100 mmol/L (ref 96–112)
Creatinine, Ser: 1.09 mg/dL (ref 0.50–1.35)
GFR calc Af Amer: 79 mL/min — ABNORMAL LOW (ref >90)
GFR calc non Af Amer: 68 mL/min — ABNORMAL LOW (ref >90)
Glucose, Bld: 97 mg/dL (ref 70–99)
POTASSIUM: 4.3 mmol/L (ref 3.5–5.1)
SODIUM: 136 mmol/L (ref 135–145)

## 2014-05-04 LAB — CBC
HCT: 41.1 % (ref 39.0–52.0)
HEMOGLOBIN: 14 g/dL (ref 13.0–17.0)
MCH: 32.9 pg (ref 26.0–34.0)
MCHC: 34.1 g/dL (ref 30.0–36.0)
MCV: 96.7 fL (ref 78.0–100.0)
Platelets: 245 10*3/uL (ref 150–400)
RBC: 4.25 MIL/uL (ref 4.22–5.81)
RDW: 14.1 % (ref 11.5–15.5)
WBC: 11.5 10*3/uL — ABNORMAL HIGH (ref 4.0–10.5)

## 2014-05-04 LAB — BRAIN NATRIURETIC PEPTIDE: B Natriuretic Peptide: 30 pg/mL (ref 0.0–100.0)

## 2014-05-04 LAB — TROPONIN I

## 2014-05-04 MED ORDER — PRAVASTATIN SODIUM 10 MG PO TABS
20.0000 mg | ORAL_TABLET | Freq: Every day | ORAL | Status: DC
  Administered 2014-05-05: 20 mg via ORAL
  Filled 2014-05-04: qty 2

## 2014-05-04 MED ORDER — NITROGLYCERIN 2 % TD OINT
1.0000 [in_us] | TOPICAL_OINTMENT | Freq: Once | TRANSDERMAL | Status: AC
  Administered 2014-05-04: 1 [in_us] via TOPICAL
  Filled 2014-05-04: qty 1

## 2014-05-04 MED ORDER — HEPARIN SODIUM (PORCINE) 5000 UNIT/ML IJ SOLN
5000.0000 [IU] | Freq: Three times a day (TID) | INTRAMUSCULAR | Status: DC
  Administered 2014-05-05 (×2): 5000 [IU] via SUBCUTANEOUS
  Filled 2014-05-04 (×2): qty 1

## 2014-05-04 MED ORDER — PANTOPRAZOLE SODIUM 40 MG PO TBEC
40.0000 mg | DELAYED_RELEASE_TABLET | Freq: Two times a day (BID) | ORAL | Status: DC
Start: 2014-05-04 — End: 2014-05-05
  Administered 2014-05-05 (×2): 40 mg via ORAL
  Filled 2014-05-04 (×2): qty 1

## 2014-05-04 MED ORDER — FOLIC ACID 1 MG PO TABS
1.0000 mg | ORAL_TABLET | Freq: Every day | ORAL | Status: DC
  Administered 2014-05-05: 1 mg via ORAL
  Filled 2014-05-04: qty 1

## 2014-05-04 MED ORDER — ASPIRIN 81 MG PO CHEW
324.0000 mg | CHEWABLE_TABLET | Freq: Once | ORAL | Status: AC
  Administered 2014-05-04: 324 mg via ORAL

## 2014-05-04 MED ORDER — NITROGLYCERIN 0.4 MG SL SUBL
SUBLINGUAL_TABLET | SUBLINGUAL | Status: AC
  Filled 2014-05-04: qty 3

## 2014-05-04 MED ORDER — DOXAZOSIN MESYLATE 2 MG PO TABS
4.0000 mg | ORAL_TABLET | Freq: Every day | ORAL | Status: DC
  Administered 2014-05-05: 4 mg via ORAL
  Filled 2014-05-04: qty 2

## 2014-05-04 MED ORDER — ASPIRIN 81 MG PO CHEW
CHEWABLE_TABLET | ORAL | Status: AC
Start: 2014-05-04 — End: 2014-05-05
  Filled 2014-05-04: qty 4

## 2014-05-04 MED ORDER — ASPIRIN 325 MG PO TABS: 325.0000 mg | ORAL_TABLET | ORAL | Status: DC

## 2014-05-04 MED ORDER — MORPHINE SULFATE 2 MG/ML IJ SOLN: 2.0000 mg | INTRAMUSCULAR | Status: DC | PRN

## 2014-05-04 MED ORDER — ASPIRIN EC 325 MG PO TBEC
325.0000 mg | DELAYED_RELEASE_TABLET | Freq: Every day | ORAL | Status: DC
  Administered 2014-05-05: 325 mg via ORAL
  Filled 2014-05-04: qty 1

## 2014-05-04 MED ORDER — MORPHINE SULFATE 4 MG/ML IJ SOLN
4.0000 mg | Freq: Once | INTRAMUSCULAR | Status: AC
  Administered 2014-05-04: 4 mg via INTRAVENOUS
  Filled 2014-05-04: qty 1

## 2014-05-04 MED ORDER — SODIUM CHLORIDE 0.9 % IJ SOLN
3.0000 mL | Freq: Two times a day (BID) | INTRAMUSCULAR | Status: DC
  Administered 2014-05-05 (×2): 3 mL via INTRAVENOUS

## 2014-05-04 MED ORDER — ALPRAZOLAM 0.5 MG PO TABS
0.5000 mg | ORAL_TABLET | Freq: Every evening | ORAL | Status: DC | PRN
  Administered 2014-05-05: 0.5 mg via ORAL
  Filled 2014-05-04: qty 1

## 2014-05-04 MED ORDER — LISINOPRIL 10 MG PO TABS
10.0000 mg | ORAL_TABLET | Freq: Every day | ORAL | Status: DC
  Administered 2014-05-05: 10 mg via ORAL
  Filled 2014-05-04: qty 1

## 2014-05-04 MED ORDER — METOPROLOL TARTRATE 25 MG PO TABS
25.0000 mg | ORAL_TABLET | Freq: Two times a day (BID) | ORAL | Status: DC
  Administered 2014-05-04 – 2014-05-05 (×2): 25 mg via ORAL
  Filled 2014-05-04 (×2): qty 1

## 2014-05-04 MED ORDER — NITROGLYCERIN 0.4 MG SL SUBL
0.4000 mg | SUBLINGUAL_TABLET | SUBLINGUAL | Status: DC | PRN
  Administered 2014-05-04: 0.4 mg via SUBLINGUAL

## 2014-05-04 NOTE — ED Provider Notes (Signed)
CSN: [id number]     Arrival date & time 05/04/14  1925 History   First MD Initiated Contact with Patient 05/04/14 1943     Chief Complaint  Patient presents with  . Chest Pain     (Consider location/radiation/quality/duration/timing/severity/associated sxs/prior Treatment) HPI  This a 69 year old male with history of high cholesterol and hypertension who presents with chest pain. Patient reports onset of chest pain earlier this morning. He states that the chest pain has been ongoing. It waxes and wanes but does not get any worse with exertion. He has not identified any aggravating or alleviating factors. Currently has pain is 5 out of 10. He describes as a pressure. Denies any shortness of breath or diaphoresis. No known history of coronary artery disease but does report having a stress test 10 years ago that was "normal." Denies any fevers or cough. Denies any recent leg swelling, hospitalization, surgeries.  Past Medical History  Diagnosis Date  . High cholesterol   . Hypertension   . Heart palpitations   . GERD (gastroesophageal reflux disease)   . Anxiety   . Colon polyp   . Irritable bowel syndrome   . Back pain, chronic   . Osteoporosis   . Psoriasis    Past Surgical History  Procedure Laterality Date  . Foot fracture surgery      left foot  . Knee arthroscopy      left  . Back surgery     Family History  Problem Relation Age of Onset  . Colon cancer Neg Hx    History  Substance Use Topics  . Smoking status: Former Research scientist (life sciences)  . Smokeless tobacco: Never Used     Comment: Quit 25 yrs now   . Alcohol Use: Yes     Comment: Rare     Review of Systems  Constitutional: Negative.  Negative for fever.  Respiratory: Positive for chest tightness. Negative for cough and shortness of breath.   Cardiovascular: Positive for chest pain. Negative for leg swelling.  Gastrointestinal: Negative.  Negative for abdominal pain.  Genitourinary: Negative.  Negative for dysuria.   Musculoskeletal: Negative for back pain.  Skin: Negative for rash.  Neurological: Negative for headaches.  All other systems reviewed and are negative.     Allergies  Cephalexin and Contrast media  Home Medications   Prior to Admission medications   Medication Sig Start Date End Date Taking? Authorizing Provider  ALPRAZolam Duanne Moron) 0.5 MG tablet Take 0.5 mg by mouth at bedtime as needed for sleep.    Yes Historical Provider, MD  doxazosin (CARDURA) 4 MG tablet Take 4 mg by mouth at bedtime.     Yes Historical Provider, MD  folic acid (FOLVITE) 1 MG tablet Take 1 mg by mouth daily.     Yes Historical Provider, MD  lisinopril (PRINIVIL,ZESTRIL) 10 MG tablet Take 10 mg by mouth daily.   Yes Historical Provider, MD  methotrexate (RHEUMATREX) 2.5 MG tablet Take 2.5 mg by mouth as needed. Take 3 tab weekly Caution:Chemotherapy. Protect from light.   Yes Historical Provider, MD  pantoprazole (PROTONIX) 40 MG tablet Take 1 tablet (40 mg total) by mouth 2 (two) times daily. 10/07/11  Yes Ladene Artist, MD  pravastatin (PRAVACHOL) 20 MG tablet Take 20 mg by mouth daily.     Yes Historical Provider, MD  Probiotic Product (PROBIOTIC PO) Take 1 capsule by mouth daily.   Yes Historical Provider, MD  hyoscyamine (LEVBID) 0.375 MG 12 hr tablet Take 1 tablet (0.375 mg  total) by mouth every 12 (twelve) hours as needed for cramping. Patient not taking: Reported on 05/04/2014 09/18/11 09/17/12  Willia Craze, NP  predniSONE (DELTASONE) 50 MG tablet Take 1 tab at 10 Pm on Thurs 6-25 Take 1 tab 4:00 AM  on Fri 6-26  Take 1 tab at 10 :00 am  On Fri 6-26 Patient not taking: Reported on 05/04/2014 07/27/13   Willia Craze, NP   BP 124/66 mmHg  Pulse 57  Temp(Src) 97.7 F (36.5 C) (Oral)  Resp 25  Ht 5\' 11"  (1.803 m)  Wt 274 lb (124.286 kg)  BMI 38.23 kg/m2  SpO2 98% Physical Exam  Constitutional: He is oriented to person, place, and time. He appears well-developed and well-nourished.  Overweight   HENT:  Head: Normocephalic and atraumatic.  Eyes: Pupils are equal, round, and reactive to light.  Cardiovascular: Normal rate, regular rhythm and normal heart sounds.   No murmur heard. Pulmonary/Chest: Effort normal and breath sounds normal. No respiratory distress. He has no wheezes.  Abdominal: Soft. Bowel sounds are normal. There is no tenderness. There is no rebound and no guarding.  Musculoskeletal: He exhibits edema.  1+ lower extremity edema  Neurological: He is alert and oriented to person, place, and time.  Skin: Skin is warm and dry.  Psychiatric: He has a normal mood and affect.  Nursing note and vitals reviewed.   ED Course  Procedures (including critical care time) Labs Review Labs Reviewed  CBC - Abnormal; Notable for the following:    WBC 11.5 (*)    All other components within normal limits  BASIC METABOLIC PANEL - Abnormal; Notable for the following:    GFR calc non Af Amer 68 (*)    GFR calc Af Amer 79 (*)    All other components within normal limits  TROPONIN I  BRAIN NATRIURETIC PEPTIDE    Imaging Review Dg Chest Port 1 View  05/04/2014   CLINICAL DATA:  Chest pressure. Onset of symptoms at 7 a.m. today. Short of breath. Cough.  EXAM: PORTABLE CHEST - 1 VIEW  COMPARISON:  01/19/2012.  FINDINGS: The cardiopericardial silhouette is enlarged. Low lung volumes are present. Pulmonary vascular congestion appears present however some of this may be due to low lung volumes. Low volumes accentuate the pulmonary vasculature. No focal consolidation or large effusion. Monitoring leads project over the chest.  IMPRESSION: Cardiomegaly and low volume chest. Probable pulmonary vascular congestion.   Electronically Signed   By: Dereck Ligas M.D.   On: 05/04/2014 20:08     EKG Interpretation   Date/Time:  Thursday May 04 2014 19:35:28 EDT Ventricular Rate:  70 PR Interval:  166 QRS Duration: 112 QT Interval:  400 QTC Calculation: 432 R Axis:   35 Text  Interpretation:  Sinus rhythm Incomplete right bundle branch block  Baseline wander in lead(s) II V3 No significant change since last tracing  Confirmed by HORTON  MD, COURTNEY ([id number]) on 05/04/2014 7:43:51 PM      MDM   Final diagnoses:  Chest pain, unspecified chest pain type    Patient presents with chest pain. Ongoing since 7:30 this morning. Nontoxic on exam.  Vital signs stable. Chest pain is not reproducible. Patient does have evidence of lower extremity edema. Not pleuritic in nature. Incomplete right bundle branch block which was unchanged from prior on EKG. Chest x-ray shows probable pulmonary vascular congestion. BNP is normal and troponin is normal. Patient was given nitroglycerin paste without any improvement of his symptoms.  Morphine was added to regimen. Heart score is 4 for risk factors, age, and nonspecific repolarization disturbance.    Will admit for serial cardiac enzymes. Low suspicion at this time for PE as cause of chest pain. Discussed with Dr. Marin Comment.    Merryl Hacker, MD 05/04/14 2147

## 2014-05-04 NOTE — Addendum Note (Signed)
Addended by: Leia Alf on: 05/04/2014 03:13 PM   Modules accepted: Orders

## 2014-05-04 NOTE — H&P (Signed)
[facility] History and Physical  JODIE LEINER IOX:[id number] DOB: 19-Aug-1945    PCP:   Leonides Grills, MD   Chief Complaint: chest pressure.   HPI: Alexander Nunez is an 69 y.o. male with hx of anxiety, palpitation on Lopressor 25mg  BID, HLD on Pravastatin, GERD on Protonix, HTN on Lisinopril, betablocker and alpha blocker, sleep apnea on CPAP, IBS, presented to the ER as he felt his heart "pounding" and feeling pressure for the whole day today.  He did not have any diaphoresis, nausea, vomiting, lightheadedness, or SOB.  No pleuritic CP.  He never had any exertional symptoms.  Evaluation in the ER showed negative troponin, EKG was unremarkable, and CXR showed pulmonary vascular congestion, but no infiltrate.   He has BNP of only 30, WBC of 11K and Hb of 14 grams per dL.  Hospitalist was asked to admit him for cardiac r/out.   Rewiew of Systems:  Constitutional: Negative for malaise, fever and chills. No significant weight loss or weight gain Eyes: Negative for eye pain, redness and discharge, diplopia, visual changes, or flashes of light. ENMT: Negative for ear pain, hoarseness, nasal congestion, sinus pressure and sore throat. No headaches; tinnitus, drooling, or problem swallowing. Cardiovascular: Negative for chest pain, palpitations, diaphoresis, dyspnea and peripheral edema. ; No orthopnea, PND Respiratory: Negative for cough, hemoptysis, wheezing and stridor. No pleuritic chestpain. Gastrointestinal: Negative for nausea, vomiting, diarrhea, constipation, abdominal pain, melena, blood in stool, hematemesis, jaundice and rectal bleeding.    Genitourinary: Negative for frequency, dysuria, incontinence,flank pain and hematuria; Musculoskeletal: Negative for back pain and neck pain. Negative for swelling and trauma.;  Skin: . Negative for pruritus, rash, abrasions, bruising and skin lesion.; ulcerations Neuro: Negative for headache, lightheadedness and neck stiffness. Negative for  weakness, altered level of consciousness , altered mental status, extremity weakness, burning feet, involuntary movement, seizure and syncope.  Psych: negative for anxiety, depression, insomnia, tearfulness, panic attacks, hallucinations, paranoia, suicidal or homicidal ideation    Past Medical History  Diagnosis Date  . High cholesterol   . Hypertension   . Heart palpitations   . GERD (gastroesophageal reflux disease)   . Anxiety   . Colon polyp   . Irritable bowel syndrome   . Back pain, chronic   . Osteoporosis   . Psoriasis     Past Surgical History  Procedure Laterality Date  . Foot fracture surgery      left foot  . Knee arthroscopy      left  . Back surgery      Medications:  HOME MEDS: Prior to Admission medications   Medication Sig Start Date End Date Taking? Authorizing Provider  ALPRAZolam Duanne Moron) 0.5 MG tablet Take 0.5 mg by mouth at bedtime as needed for sleep.    Yes Historical Provider, MD  doxazosin (CARDURA) 4 MG tablet Take 4 mg by mouth at bedtime.     Yes Historical Provider, MD  folic acid (FOLVITE) 1 MG tablet Take 1 mg by mouth daily.     Yes Historical Provider, MD  lisinopril (PRINIVIL,ZESTRIL) 10 MG tablet Take 10 mg by mouth daily.   Yes Historical Provider, MD  methotrexate (RHEUMATREX) 2.5 MG tablet Take 2.5 mg by mouth as needed. Take 3 tab weekly Caution:Chemotherapy. Protect from light.   Yes Historical Provider, MD  pantoprazole (PROTONIX) 40 MG tablet Take 1 tablet (40 mg total) by mouth 2 (two) times daily. 10/07/11  Yes Ladene Artist, MD  pravastatin (PRAVACHOL) 20 MG tablet Take 20 mg  by mouth daily.     Yes Historical Provider, MD  Probiotic Product (PROBIOTIC PO) Take 1 capsule by mouth daily.   Yes Historical Provider, MD  hyoscyamine (LEVBID) 0.375 MG 12 hr tablet Take 1 tablet (0.375 mg total) by mouth every 12 (twelve) hours as needed for cramping. Patient not taking: Reported on 05/04/2014 09/18/11 09/17/12  Willia Craze, NP   predniSONE (DELTASONE) 50 MG tablet Take 1 tab at 10 Pm on Thurs 6-25 Take 1 tab 4:00 AM  on Fri 6-26  Take 1 tab at 10 :00 am  On Fri 6-26 Patient not taking: Reported on 05/04/2014 07/27/13   Willia Craze, NP     Allergies:  Allergies  Allergen Reactions  . Cephalexin Other (See Comments)    fever  . Contrast Media [Iodinated Diagnostic Agents] Hives and Rash    Social History:   reports that he has quit smoking. He has never used smokeless tobacco. He reports that he drinks alcohol. He reports that he does not use illicit drugs.  Family History: Family History  Problem Relation Age of Onset  . Colon cancer Neg Hx      Physical Exam: Filed Vitals:   05/04/14 2032 05/04/14 2108 05/04/14 2147 05/04/14 2233  BP: 136/74 124/66 121/69 133/54  Pulse: 59 57 64 58  Temp: 98.5 F (36.9 C) 97.7 F (36.5 C) 97.9 F (36.6 C)   TempSrc: Oral Oral Oral   Resp: 19 25 20 16   Height:      Weight:      SpO2: 99% 98% 99% 98%   Blood pressure 133/54, pulse 58, temperature 97.9 F (36.6 C), temperature source Oral, resp. rate 16, height 5\' 11"  (1.803 m), weight 124.286 kg (274 lb), SpO2 98 %.  GEN:  Pleasant  patient lying in the stretcher in no acute distress; cooperative with exam. PSYCH:  alert and oriented x4; does not appear anxious or depressed; affect is appropriate. HEENT: Mucous membranes pink and anicteric; PERRLA; EOM intact; no cervical lymphadenopathy nor thyromegaly or carotid bruit; no JVD; There were no stridor. Neck is very supple. Breasts:: Not examined CHEST WALL: No tenderness CHEST: Normal respiration, clear to auscultation bilaterally.  HEART: Regular rate and rhythm.  There was a flow murmur soft, heard over the LSB with no rub, or gallops.   BACK: No kyphosis or scoliosis; no CVA tenderness ABDOMEN: soft and non-tender; no masses, no organomegaly, normal abdominal bowel sounds; no pannus; no intertriginous candida. There is no rebound and no  distention. Rectal Exam: Not done EXTREMITIES: No bone or joint deformity; age-appropriate arthropathy of the hands and knees; no edema; no ulcerations.  There is no calf tenderness. Genitalia: not examined PULSES: 2+ and symmetric SKIN: Normal hydration no rash or ulceration CNS: Cranial nerves 2-12 grossly intact no focal lateralizing neurologic deficit.  Speech is fluent; uvula elevated with phonation, facial symmetry and tongue midline. DTR are normal bilaterally, cerebella exam is intact, barbinski is negative and strengths are equaled bilaterally.  No sensory loss.   Labs on Admission:  Basic Metabolic Panel:  Recent Labs Lab 05/04/14 1950  NA 136  K 4.3  CL 100  CO2 27  GLUCOSE 97  BUN 23  CREATININE 1.09  CALCIUM 9.3   Liver Function Tests: No results for input(s): AST, ALT, ALKPHOS, BILITOT, PROT, ALBUMIN in the last 168 hours. No results for input(s): LIPASE, AMYLASE in the last 168 hours. No results for input(s): AMMONIA in the last 168 hours. CBC:  Recent Labs Lab 05/04/14 1950  WBC 11.5*  HGB 14.0  HCT 41.1  MCV 96.7  PLT 245   Cardiac Enzymes:  Recent Labs Lab 05/04/14 1950  TROPONINI <0.03    CBG: No results for input(s): GLUCAP in the last 168 hours.   Radiological Exams on Admission: Dg Chest Port 1 View  05/04/2014   CLINICAL DATA:  Chest pressure. Onset of symptoms at 7 a.m. today. Short of breath. Cough.  EXAM: PORTABLE CHEST - 1 VIEW  COMPARISON:  01/19/2012.  FINDINGS: The cardiopericardial silhouette is enlarged. Low lung volumes are present. Pulmonary vascular congestion appears present however some of this may be due to low lung volumes. Low volumes accentuate the pulmonary vasculature. No focal consolidation or large effusion. Monitoring leads project over the chest.  IMPRESSION: Cardiomegaly and low volume chest. Probable pulmonary vascular congestion.   Electronically Signed   By: Dereck Ligas M.D.   On: 05/04/2014 20:08    EKG:  Independently reviewed.    Assessment/Plan Present on Admission:  . Atypical chest pain . GERD . Essential hypertension . Sleep apnea  PLAN:  Given his multiple risks for CAD, will admit him for r/out.  Will continue his meds.  He is stable, full code, and will be admitted to telemetry.  Will obtain a cardiac ECHO, and place on NPO for possible stress test.  He will be given an ASA per day.  I will continue his betablocker, ACE I, and Statin.  Will give CPAP Q hs for sleep apnea.  Thank you for allowing me to participate in the care of your nice patient.   Other plans as per orders.  Code Status: FULL Haskel Khan, MD. [facility] Pager [phone] 7pm to 7am.  05/04/2014, 10:35 PM

## 2014-05-04 NOTE — ED Notes (Signed)
Onset of "palpitations" last night. woke him from sleep, resolved spontaneously. This morning, onset of chest pressure 7:30am has persisted all day denies nausea, sweating or SOB

## 2014-05-05 ENCOUNTER — Encounter (HOSPITAL_COMMUNITY): Payer: Self-pay | Admitting: Adult Health

## 2014-05-05 DIAGNOSIS — K219 Gastro-esophageal reflux disease without esophagitis: Secondary | ICD-10-CM | POA: Diagnosis not present

## 2014-05-05 DIAGNOSIS — R0789 Other chest pain: Secondary | ICD-10-CM | POA: Diagnosis not present

## 2014-05-05 DIAGNOSIS — R079 Chest pain, unspecified: Secondary | ICD-10-CM | POA: Diagnosis not present

## 2014-05-05 LAB — TSH: TSH: 0.861 u[IU]/mL (ref 0.350–4.500)

## 2014-05-05 LAB — TROPONIN I
Troponin I: <0.03 ng/mL (ref <0.031)
Troponin I: <0.03 ng/mL (ref <0.031)

## 2014-05-05 MED ORDER — METOPROLOL TARTRATE 25 MG PO TABS: 25.0000 mg | ORAL_TABLET | Freq: Two times a day (BID) | ORAL | Status: DC

## 2014-05-05 MED ORDER — FUROSEMIDE 10 MG/ML IJ SOLN
20.0000 mg | Freq: Once | INTRAMUSCULAR | Status: AC
Start: 2014-05-05 — End: 2014-05-05
  Administered 2014-05-05: 20 mg via INTRAVENOUS
  Filled 2014-05-05: qty 2

## 2014-05-05 NOTE — Discharge Summary (Signed)
Physician Discharge Summary  Alexander Nunez NLG:[id number] DOB: 1945-08-22 DOA: 05/04/2014  PCP: Leonides Grills, MD  Admit date: 05/04/2014 Discharge date: 05/05/2014  Time spent: 40  minutes  Recommendations for Outpatient Follow-up:  1. Cardiology office will contact you for follow up to be schedule 2 weeks from discharge   Discharge Diagnoses:  Principal Problem:   Atypical chest pain Active Problems:   Essential hypertension   GERD   Sleep apnea   Discharge Condition: stable  Diet recommendation: heart healthy  Filed Weights   05/04/14 1935 05/04/14 2325  Weight: 124.286 kg (274 lb) 124.558 kg (274 lb 9.6 oz)    History of present illness:  Alexander Nunez is an 69 y.o. male with hx of anxiety, palpitation on Lopressor 25mg  BID, HLD on Pravastatin, GERD on Protonix, HTN on Lisinopril, betablocker and alpha blocker, sleep apnea on CPAP, IBS, presented to the ER on 05/04/14 as he felt his heart "pounding" and feeling pressure for the whole day. He did not have any diaphoresis, nausea, vomiting, lightheadedness, or SOB. No pleuritic CP. He never had any exertional symptoms. Evaluation in the ER showed negative troponin, EKG was unremarkable, and CXR showed pulmonary vascular congestion, but no infiltrate. He had BNP of only 30, WBC of 11K and Hb of 14 grams per dL.     Hospital Course:  . Chest pain: atypical. Troponin negative x4, echo 60-65% with intermediate diastolic dysfunction, EKG with NSR incomplete RBBB no acute changes. Chest xray with low volume and cardiomegaly. Evaluated by cardiology who recommended no further workup warranted inpatient. Cardiology will contact patient for follow up. At discharge he is pain free.   Marland Kitchen GERD: stable at baseline.   . Essential hypertension: controlled.   . Sleep apnea: cpap  Procedures: Echo done 0/8/14  Systolic function was normal. The estimated ejection fraction was in the range of 60% to 65%. Indeterminate  diastolic function. Wall motion was normal; there were no regional wall motion abnormalities.  Consultations:  Dr Harl Bowie cardiology  Discharge Exam: Danley Danker Vitals:   05/05/14 1056  BP:   Pulse: 61  Temp:   Resp:     General: well nourished  Appears comfortable Cardiovascular: RRR no MGR no LE edema Respiratory: normal effort BS clear bilaterally no wheeze  Discharge Instructions   Discharge Instructions    Diet - low sodium heart healthy    Complete by:  As directed      Discharge instructions    Complete by:  As directed   Take medications as directed Cardiology office will contact you for follow up appointment     Increase activity slowly    Complete by:  As directed           Current Discharge Medication List    START taking these medications   Details  metoprolol tartrate (LOPRESSOR) 25 MG tablet Take 1 tablet (25 mg total) by mouth 2 (two) times daily. Qty: 60 tablet, Refills: 0      CONTINUE these medications which have NOT CHANGED   Details  ALPRAZolam (XANAX) 0.5 MG tablet Take 0.5 mg by mouth at bedtime as needed for sleep.     doxazosin (CARDURA) 4 MG tablet Take 4 mg by mouth at bedtime.      folic acid (FOLVITE) 1 MG tablet Take 1 mg by mouth daily.      lisinopril (PRINIVIL,ZESTRIL) 10 MG tablet Take 10 mg by mouth daily.    methotrexate (RHEUMATREX) 2.5 MG tablet Take 2.5 mg by  mouth as needed. Take 3 tab weekly Caution:Chemotherapy. Protect from light.    pantoprazole (PROTONIX) 40 MG tablet Take 1 tablet (40 mg total) by mouth 2 (two) times daily. Qty: 60 tablet, Refills: 11    pravastatin (PRAVACHOL) 20 MG tablet Take 20 mg by mouth daily.      Probiotic Product (PROBIOTIC PO) Take 1 capsule by mouth daily.      STOP taking these medications     hyoscyamine (LEVBID) 0.375 MG 12 hr tablet      predniSONE (DELTASONE) 50 MG tablet        Allergies  Allergen Reactions  . Cephalexin Other (See Comments)    fever  . Contrast  Media [Iodinated Diagnostic Agents] Hives and Rash   Follow-up Information    Follow up with Jory Sims, NP. Go on 05/22/2014.   Specialty:  Nurse Practitioner   Why:  1:50 PM   Contact information:   [place] [id number] [phone]        The results of significant diagnostics from this hospitalization (including imaging, microbiology, ancillary and laboratory) are listed below for reference.    Significant Diagnostic Studies: Dg Chest Port 1 View  05/04/2014   CLINICAL DATA:  Chest pressure. Onset of symptoms at 7 a.m. today. Short of breath. Cough.  EXAM: PORTABLE CHEST - 1 VIEW  COMPARISON:  01/19/2012.  FINDINGS: The cardiopericardial silhouette is enlarged. Low lung volumes are present. Pulmonary vascular congestion appears present however some of this may be due to low lung volumes. Low volumes accentuate the pulmonary vasculature. No focal consolidation or large effusion. Monitoring leads project over the chest.  IMPRESSION: Cardiomegaly and low volume chest. Probable pulmonary vascular congestion.   Electronically Signed   By: Dereck Ligas M.D.   On: 05/04/2014 20:08   Dg Epidural/nerve Root  04/27/2014   CLINICAL DATA:  Lumbago. Low back pain extending into the left lower extremity. Left lower extremity radiculitis at the adjacent level. The patient had good relief of pain from prior injection which has since recurred.  FLUOROSCOPY TIME:  38 seconds  0.232 Gy*cm2  PROCEDURE: SELECTIVE NERVE ROOT AND TRANSFORAMINAL EPIDURAL STEROID INJECTION:  A transforaminal approach was performed on the left at the L3-4 foramen. The overlying skin was cleansed and anesthetized. A 22 gauge spinal needle was advanced into the foramen from a lateral oblique approach. Injection of 2cc of Omnipaque 180 outlined the nerve root and extended into the epidural space. No vascular or intrathecal spread is evident.  I then injected 120 mg of Depo-Medrol and 1.5 ml of 1% lidocaine. The  patient tolerated the procedure without evidence for complication.  The patient was observed for 20 minutes prior to discharge in stable neurologic condition.  IMPRESSION: Technically successful selective nerve root block and transforaminal epidural steroid injection on theleft at the L3-4 foramen.   Electronically Signed   By: San Morelle M.D.   On: 04/27/2014 10:22    Microbiology: No results found for this or any previous visit (from the past 240 hour(s)).   Labs: Basic Metabolic Panel:  Recent Labs Lab 05/04/14 1950  NA 136  K 4.3  CL 100  CO2 27  GLUCOSE 97  BUN 23  CREATININE 1.09  CALCIUM 9.3   Liver Function Tests: No results for input(s): AST, ALT, ALKPHOS, BILITOT, PROT, ALBUMIN in the last 168 hours. No results for input(s): LIPASE, AMYLASE in the last 168 hours. No results for input(s): AMMONIA in the last 168 hours.  CBC:  Recent Labs Lab 05/04/14 1950  WBC 11.5*  HGB 14.0  HCT 41.1  MCV 96.7  PLT 245   Cardiac Enzymes:  Recent Labs Lab 05/04/14 1950 05/05/14 0010 05/05/14 0523  TROPONINI <0.03 <0.03 <0.03   BNP: BNP (last 3 results)  Recent Labs  05/04/14 1950  BNP 30.0    ProBNP (last 3 results) No results for input(s): PROBNP in the last 8760 hours.  CBG: No results for input(s): GLUCAP in the last 168 hours.     SignedRadene Gunning  [facility] 05/05/2014, 12:08 PM

## 2014-05-05 NOTE — Progress Notes (Signed)
Echo shows normal LVEF, no WMAs. Workup for atypical chest pain unremarkable at this time for cardiac source. No further cardiac testing planned at this time. We will have him f/u with NP Purcell Nails in 2 weeks, I have called our office to schedule. Will sign off inpatient care.  Zandra Abts MD

## 2014-05-05 NOTE — Consult Note (Signed)
CARDIOLOGY CONSULT NOTE   Patient ID: Alexander Nunez MRN: [id number] DOB/AGE: 68-30-1947 68 y.o.  Admit Date: 05/04/2014 Referring Physician: PTH Primary Physician: Leonides Grills, MD Consulting Cardiologist: Carlyle Dolly MD Primary Cardiologist: New to Dr. Harl Bowie Reason for Consultation: Chest Pain and palpitations.  Clinical Summary Alexander Nunez is a 69 y.o.male with known history of anxiety, palpitations,hypertension,  hypercholesterolemia. GERD, OSA on CPAP, IBS presented to ER with complaints of chest pressure with an episode of palpitations and racing HR which awakened him around 2 am the night prior to coming to ER.Marland Kitchen He states he took one ASA and waited 10 minutes, the palpitations subsided and he was able to sleep. The following morning while working in his yard,riding on a golf cart, spraying Round-Up he began to have chest pressure, substernal, non radiating which remained all day, with some increased intensity while at funeral home visitation last evening. Due to continued symptoms of unrelenting chest pressure, he wanted to be checked out.   He states that he has been having palpitations "for years" and has gotten used to having them. He has never had episode of racing HR that occurred during the night which awakened him. He was seen by Dr. Peggye Pitt 7 years or more ago, and had stress test which he thinks was ok. Review of records demonstrates he was admitted to [facility] for chest pain in 2012, ruled out for MI and scheduled for OP Cardiolite.    He also states he saw a different cardiologist in the same group was placed him on metoprolol 50 mg BID for palpitations,, but his HR was in the 40;s. When he followed up with Dr. Debara Pickett, metoprolol was decreased to 25 mg BID and he has felt better, but continues to have asymptomatic palpitations.   In ER he was found to be hypertensive with BP of 160/110. HR 66, O2 Sat 97%. Had Leukocytosis with WBC 11.5, K= 4.3. Troponin was  negative. EKG demonstrated incomplete RBBB with rate of 71 bpm. CXR demonstrated cardiomegaly with low volume chest, with probable pulmonary vascular congestion. He was given NTG X1, placed on NTG paste, ASA, morphine, and metoprolol 25 mg po.   He states he is feeling better but still has chest pressure although not as heavy.   Allergies  Allergen Reactions  . Cephalexin Other (See Comments)    fever  . Contrast Media [Iodinated Diagnostic Agents] Hives and Rash    Medications Scheduled Medications: . aspirin EC  325 mg Oral Daily  . doxazosin  4 mg Oral QHS  . folic acid  1 mg Oral Daily  . heparin  5,000 Units Subcutaneous 3 times per day  . lisinopril  10 mg Oral Daily  . metoprolol tartrate  25 mg Oral BID  . pantoprazole  40 mg Oral BID  . pravastatin  20 mg Oral Daily  . sodium chloride  3 mL Intravenous Q12H    Infusions:    PRN Medications: ALPRAZolam, morphine injection   Past Medical History  Diagnosis Date  . High cholesterol   . Hypertension   . Heart palpitations   . GERD (gastroesophageal reflux disease)   . Anxiety   . Colon polyp   . Irritable bowel syndrome   . Back pain, chronic   . Osteoporosis   . Psoriasis     Past Surgical History  Procedure Laterality Date  . Foot fracture surgery      left foot  . Knee arthroscopy  left  . Back surgery      Family History  Problem Relation Age of Onset  . Colon cancer Neg Hx   . Arrhythmia Mother     Atrial fib  . Arrhythmia Sister     Atrial fib  . Stroke Sister   . Cancer Father     Bladder, Deceased   . Cancer Brother     Social History Alexander Nunez reports that he has quit smoking. He has never used smokeless tobacco. Alexander Nunez reports that he drinks about 8.4 oz of alcohol per week.  Review of Systems Complete review of systems are found to be negative unless outlined in H&P above.  Physical Examination Blood pressure 114/52, pulse 59, temperature 97.8 F (36.6 C), temperature  source Oral, resp. rate 20, height 5\' 11"  (1.803 m), weight 274 lb 9.6 oz (124.558 kg), SpO2 99 %. No intake or output data in the 24 hours ending 05/05/14 0845  Telemetry: NSR rare PVC  GEN: No acute distress HEENT: Conjunctiva and lids normal, oropharynx clear with moist mucosa. Neck: Supple, no elevated JVP or carotid bruits, no thyromegaly. Lungs: Clear to auscultation, nonlabored breathing at rest. Cardiac: Regular rate and rhythm, no S3 or significant systolic murmur, no pericardial rub. Abdomen: Soft, nontender, no hepatomegaly, bowel sounds present, no guarding or rebound. Obese. Extremities: 1+ pretibial edema, 2+ ankle edema, distal pulses 2+. Skin: Warm and dry. Musculoskeletal: No kyphosis. Neuropsychiatric: Alert and oriented x3, affect grossly appropriate.  Prior Cardiac Testing/Procedures Stress test in 2012 no records available.  Lab Results  Basic Metabolic Panel:  Recent Labs Lab 05/04/14 1950  NA 136  K 4.3  CL 100  CO2 27  GLUCOSE 97  BUN 23  CREATININE 1.09  CALCIUM 9.3   CBC:  Recent Labs Lab 05/04/14 1950  WBC 11.5*  HGB 14.0  HCT 41.1  MCV 96.7  PLT 245    Cardiac Enzymes:  Recent Labs Lab 05/04/14 1950 05/05/14 0010 05/05/14 0523  TROPONINI <0.03 <0.03 <0.03    Radiology: Dg Chest Port 1 View  05/04/2014   CLINICAL DATA:  Chest pressure. Onset of symptoms at 7 a.m. today. Short of breath. Cough.  EXAM: PORTABLE CHEST - 1 VIEW  COMPARISON:  01/19/2012.  FINDINGS: The cardiopericardial silhouette is enlarged. Low lung volumes are present. Pulmonary vascular congestion appears present however some of this may be due to low lung volumes. Low volumes accentuate the pulmonary vasculature. No focal consolidation or large effusion. Monitoring leads project over the chest.  IMPRESSION: Cardiomegaly and low volume chest. Probable pulmonary vascular congestion.   Electronically Signed   By: Dereck Ligas M.D.   On: 05/04/2014 20:08      ECG: SR with incomplete RBBB, rate of 71 bpm.    Impression and Recommendations  1. Chest Pain: Typical and atypical features. Described as constant pressure lasting all day since around 7:30 am, until admission with residual pressure now but much less. Had been admitted in 2012 for chest pain and found to have gastric issues. Now on NTG paste.  Troponin is found to be negative X 2, EKG does not show acute ST changes indicative of ACS. Can plan for OP stress test as with body habitus will need 2 day imaging to rule out cardiac etiology with multiple CVRF.   2. Hypertension: Now much better controlled. Echo for LV fx in this setting. On ACE, metoprolol 25 mg BID.   3. Palpitations: Patient has chronic palpitations but has had one episode  of rapid HR awakening him around 2 am night before last. Continues on BB. Consider OP cardiac monitor at follow up if we do not see any arrhythmia';s on telemetry during hospitalization.   4. OSA: He is compliant with CPAP.   Signed: Phill Myron. Lawrence NP [place]  05/05/2014, 8:45 AM Co-Sign MD  Patient seen and discussed with NP Purcell Nails, I agree with her documentation. 69 yo male hx of palpitations, anxiety, HL, GERD, HTN, OSA admitted with chest pain. Reports symptoms started yesterday morning while sitting on golf cart at rest. 8/1- pressure in midchest with no other associated symptoms. Varied somewhat in intensity but last nearly 24 hrs constantly. Was not positional. No similar symptoms before. Denies any SOB or DOE.    BNP 30, K 4.3, Cr 1.09, Hgb 14, WBC 11.5, TSH 0.86, trop neg x 3. CXR with cardiomegaly, ? congestion EKG SR, incomplete RBBB. No ischemic changes.   Chest pain atypical, primarily due constant pain for nearly 24 hours. No objective evidence of ischemia by EKG or cardiac enzymes. Will f/u echo results, if no significant abnormalities would not plan for inpatient stress testing, would arrange outpatient f/u with NP Lawrence in  1-2 weeks to reevaluate symptoms and consider outpatient stress testing at that time.   Zandra Abts MD

## 2014-05-05 NOTE — Progress Notes (Signed)
UR chart review completed.  

## 2014-05-05 NOTE — Progress Notes (Signed)
Reviewed discharge instructions with pt and family.  Answered questions.  Removed telemetry and IV, pt tolerated well.

## 2014-05-05 NOTE — Progress Notes (Signed)
*  PRELIMINARY RESULTS* Echocardiogram 2D Echocardiogram has been performed.  Alexander Nunez 05/05/2014, 9:54 AM

## 2014-05-05 NOTE — Care Management Note (Signed)
    Page 1 of 1   05/05/2014     11:48:39 AM CARE MANAGEMENT NOTE 05/05/2014  Patient:  Alexander Nunez, Alexander Nunez   Account Number:  [id number]  Date Initiated:  05/05/2014  Documentation initiated by:  Jolene Provost  Subjective/Objective Assessment:   Pt is from home, lives with wife. Pt independent at baseline. Pt discharging home today. No CM needs.     Action/Plan:   Anticipated DC Date:  05/05/2014   Anticipated DC Plan:  [place]  CM consult      Choice offered to / List presented to:             Status of service:  Completed, signed off Medicare Important Message given?   (If response is "NO", the following Medicare IM given date fields will be blank) Date Medicare IM given:   Medicare IM given by:   Date Additional Medicare IM given:   Additional Medicare IM given by:    Discharge Disposition:  HOME/SELF CARE  Per UR Regulation:    If discussed at Long Length of Stay Meetings, dates discussed:    Comments:  05/05/2014 [place], RN, MSN, CM

## 2014-05-22 ENCOUNTER — Ambulatory Visit (INDEPENDENT_AMBULATORY_CARE_PROVIDER_SITE_OTHER): Payer: Medicare Other | Admitting: Adult Health

## 2014-05-22 ENCOUNTER — Encounter: Payer: Self-pay | Admitting: Adult Health

## 2014-05-22 VITALS — BP 116/68 | HR 72 | Ht 71.0 in | Wt 277.0 lb

## 2014-05-22 DIAGNOSIS — R0789 Other chest pain: Secondary | ICD-10-CM | POA: Diagnosis not present

## 2014-05-22 NOTE — Patient Instructions (Signed)
Your physician recommends that you schedule a follow-up appointment in: As needed  Your physician recommends that you continue on your current medications as directed. Please refer to the Current Medication list given to you today.  Thank you for choosing [facility]!

## 2014-05-22 NOTE — Progress Notes (Deleted)
Name: Alexander Nunez    DOB: 1945-02-12  Age: 69 y.o.  MR#: [id number]       PCP:  Leonides Grills, MD      Insurance: Payor: MEDICARE / Plan: MEDICARE PART A AND B / Product Type: *No Product type* /   CC:    Chief Complaint  Patient presents with  . Chest Pain    VS Filed Vitals:   05/22/14 1357  BP: 116/68  Pulse: 72  Height: _0  (1.803 m)  Weight: 277 lb (125.646 kg)    Weights Current Weight  05/22/14 277 lb (125.646 kg)  05/04/14 274 lb 9.6 oz (124.558 kg)  07/22/13 261 lb 12.8 oz (118.752 kg)    Blood Pressure  BP Readings from Last 3 Encounters:  05/22/14 116/68  05/05/14 114/52  12/20/13 161/69     Admit date:  (Not on file) Last encounter with RMR:  Visit date not found   Allergy Cephalexin and Contrast media  Current Outpatient Prescriptions  Medication Sig Dispense Refill  . ALPRAZolam (XANAX) 0.5 MG tablet Take 0.5 mg by mouth at bedtime as needed for sleep.     Marland Kitchen aspirin EC 81 MG tablet Take 81 mg by mouth daily.    Marland Kitchen doxazosin (CARDURA) 4 MG tablet Take 4 mg by mouth at bedtime.      . folic acid (FOLVITE) 1 MG tablet Take 1 mg by mouth daily.      Marland Kitchen lisinopril (PRINIVIL,ZESTRIL) 10 MG tablet Take 10 mg by mouth daily.    . methotrexate (RHEUMATREX) 2.5 MG tablet Take 2.5 mg by mouth as needed. Take 3 tab weekly Caution:Chemotherapy. Protect from light.    . metoprolol tartrate (LOPRESSOR) 25 MG tablet Take 1 tablet (25 mg total) by mouth 2 (two) times daily. 60 tablet 0  . oxyCODONE-acetaminophen (PERCOCET) 10-325 MG per tablet Take 1 tablet by mouth every 4 (four) hours as needed for pain.    . pantoprazole (PROTONIX) 40 MG tablet Take 1 tablet (40 mg total) by mouth 2 (two) times daily. 60 tablet 11  . pravastatin (PRAVACHOL) 20 MG tablet Take 20 mg by mouth daily.      . Probiotic Product (PROBIOTIC PO) Take 1 capsule by mouth daily.     No current facility-administered medications for this visit.    Discontinued Meds:   There are no  discontinued medications.  Patient Active Problem List   Diagnosis Date Noted  . Atypical chest pain 05/04/2014  . Sleep apnea 05/04/2014  . Abdominal bloating 07/22/2013  . Weakness of left leg 08/12/2012  . Generalized abdominal pain 09/02/2011  . PERSONAL HX COLONIC POLYPS 04/19/2009  . IRRITABLE BOWEL SYNDROME 04/06/2008  . ABDOMINAL BLOATING 03/07/2008  . CHANGE IN BOWELS 03/07/2008  . ABDOMINAL PAIN-MULTIPLE SITES 03/07/2008  . Essential hypertension 03/06/2008  . GERD 03/06/2008  . GASTRITIS 03/06/2008  . CHRONIC RESPIRATORY DISEASE ARISE PERINTL PERIOD 03/06/2008  . HYPERLIPIDEMIA 02/06/2003    LABS    Component Value Date/Time   NA 136 05/04/2014 1950   NA 136 07/27/2013 0952   NA 140 02/02/2009 0350   K 4.3 05/04/2014 1950   K 4.7 07/27/2013 0952   K 3.9 02/02/2009 0350   CL 100 05/04/2014 1950   CL 104 07/27/2013 0952   CL 102 02/02/2009 0350   CO2 27 05/04/2014 1950   CO2 31 07/27/2013 0952   CO2 30 02/02/2009 0350   GLUCOSE 97 05/04/2014 1950   GLUCOSE 101* 07/27/2013 6295  GLUCOSE 133* 02/02/2009 0350   BUN 23 05/04/2014 1950   BUN 20 07/27/2013 0952   BUN 18 02/02/2009 0350   CREATININE 1.09 05/04/2014 1950   CREATININE 1.4 07/27/2013 0952   CREATININE 1.06 02/02/2009 0350   CALCIUM 9.3 05/04/2014 1950   CALCIUM 9.3 07/27/2013 0952   CALCIUM 9.0 02/02/2009 0350   GFRNONAA 68* 05/04/2014 1950   GFRNONAA >60 02/02/2009 0350   GFRNONAA 65 03/07/2008 1443   GFRAA 79* 05/04/2014 1950   GFRAA  02/02/2009 0350    >60        The eGFR has been calculated using the MDRD equation. This calculation has not been validated in all clinical situations. eGFR's persistently <60 mL/min signify possible Chronic Kidney Disease.   GFRAA 79 03/07/2008 1443   CMP     Component Value Date/Time   NA 136 05/04/2014 1950   K 4.3 05/04/2014 1950   CL 100 05/04/2014 1950   CO2 27 05/04/2014 1950   GLUCOSE 97 05/04/2014 1950   BUN 23 05/04/2014 1950    CREATININE 1.09 05/04/2014 1950   CALCIUM 9.3 05/04/2014 1950   PROT 7.1 07/27/2013 0952   ALBUMIN 4.1 07/27/2013 0952   AST 19 07/27/2013 0952   ALT 26 07/27/2013 0952   ALKPHOS 56 07/27/2013 0952   BILITOT 0.8 07/27/2013 0952   GFRNONAA 68* 05/04/2014 1950   GFRAA 79* 05/04/2014 1950       Component Value Date/Time   WBC 11.5* 05/04/2014 1950   WBC 11.0* 09/02/2011 1602   WBC 9.6 02/02/2009 0350   HGB 14.0 05/04/2014 1950   HGB 14.7 09/02/2011 1602   HGB 15.4 02/02/2009 0350   HCT 41.1 05/04/2014 1950   HCT 43.8 09/02/2011 1602   HCT 44.4 02/02/2009 0350   MCV 96.7 05/04/2014 1950   MCV 96.7 09/02/2011 1602   MCV 97.3 02/02/2009 0350    Lipid Panel  No results found for: CHOL, TRIG, HDL, CHOLHDL, VLDL, LDLCALC, LDLDIRECT  ABG No results found for: PHART, PCO2ART, PO2ART, HCO3, TCO2, ACIDBASEDEF, O2SAT   Lab Results  Component Value Date   TSH 0.861 05/05/2014   BNP (last 3 results)  Recent Labs  05/04/14 1950  BNP 30.0    ProBNP (last 3 results) No results for input(s): PROBNP in the last 8760 hours.  Cardiac Panel (last 3 results) No results for input(s): CKTOTAL, CKMB, TROPONINI, RELINDX in the last 72 hours.  Iron/TIBC/Ferritin/ %Sat No results found for: IRON, TIBC, FERRITIN, IRONPCTSAT   EKG Orders placed or performed during the hospital encounter of 05/04/14  . EKG 12-Lead  . EKG 12-Lead  . ED EKG (<93mns upon arrival to the ED)  . ED EKG (<155ms upon arrival to the ED)  . EKG 12-Lead  . EKG 12-Lead  . EKG 12-Lead  . EKG 12-Lead  . EKG     Prior Assessment and Plan Problem List as of 05/22/2014      Cardiovascular and Mediastinum   Essential hypertension     Respiratory   CHRONIC RESPIRATORY DISEASE ARISE PERINTL PERIOD     Digestive   GERD   GASTRITIS   IRRITABLE BOWEL SYNDROME   CHANGE IN BOWELS     Nervous and Auditory   Weakness of left leg     Other   HYPERLIPIDEMIA   ABDOMINAL BLOATING   ABDOMINAL PAIN-MULTIPLE  SITES   PERSONAL HX COLONIC POLYPS   Generalized abdominal pain   Abdominal bloating   Atypical chest pain   Sleep apnea  Imaging: Dg Chest Port 1 View  05/04/2014   CLINICAL DATA:  Chest pressure. Onset of symptoms at 7 a.m. today. Short of breath. Cough.  EXAM: PORTABLE CHEST - 1 VIEW  COMPARISON:  01/19/2012.  FINDINGS: The cardiopericardial silhouette is enlarged. Low lung volumes are present. Pulmonary vascular congestion appears present however some of this may be due to low lung volumes. Low volumes accentuate the pulmonary vasculature. No focal consolidation or large effusion. Monitoring leads project over the chest.  IMPRESSION: Cardiomegaly and low volume chest. Probable pulmonary vascular congestion.   Electronically Signed   By: Dereck Ligas M.D.   On: 05/04/2014 20:08   Dg Epidural/nerve Root  04/27/2014   CLINICAL DATA:  Lumbago. Low back pain extending into the left lower extremity. Left lower extremity radiculitis at the adjacent level. The patient had good relief of pain from prior injection which has since recurred.  FLUOROSCOPY TIME:  38 seconds  0.232 Gy*cm2  PROCEDURE: SELECTIVE NERVE ROOT AND TRANSFORAMINAL EPIDURAL STEROID INJECTION:  A transforaminal approach was performed on the left at the L3-4 foramen. The overlying skin was cleansed and anesthetized. A 22 gauge spinal needle was advanced into the foramen from a lateral oblique approach. Injection of 2cc of Omnipaque 180 outlined the nerve root and extended into the epidural space. No vascular or intrathecal spread is evident.  I then injected 120 mg of Depo-Medrol and 1.5 ml of 1% lidocaine. The patient tolerated the procedure without evidence for complication.  The patient was observed for 20 minutes prior to discharge in stable neurologic condition.  IMPRESSION: Technically successful selective nerve root block and transforaminal epidural steroid injection on theleft at the L3-4 foramen.   Electronically Signed    By: San Morelle M.D.   On: 04/27/2014 10:22

## 2014-05-22 NOTE — Progress Notes (Signed)
Cardiology Office Note   Date:  05/22/2014   ID:  Alexander Nunez, Alexander Nunez Jun 23, 1945, MRN [id number]  PCP:  Leonides Grills, MD  Cardiologist:  Cloria Spring, NP   Chief Complaint  Patient presents with  . Chest Pain      History of Present Illness: Alexander Nunez is a 69 y.o. male who presents for ongoing assessment and management of palpitations, hypertension, recent hospitalization for chest discomfort, with history of obstructive sleep apnea, on CPAP and-year-old, bowel syndrome.  The patient was seen on consultation during hospitalization and was ruled out for myocardial infarction.  Patient was found to have anxiety, his troponins were found to be negative, echocardiogram was essentially normal, with an EF of 60-65% with an intermediate diastolic dysfunction.  EKG revealed normal sinus rhythm with incomplete right bundle branch block.  The patient was sent home on metoprolol 25 mg twice a day, and continued on medications prior to this admission.  He comes today without any complaint.  No further complaints of chest pain, dyspnea, or fatigue.  He has had no rapid heart rhythm.  He is tolerating the Toprol, without complaint.  He has become more active, is walking daily, he has spent most of the morning out pine straw in his garden.  Past Medical History  Diagnosis Date  . High cholesterol   . Hypertension   . Heart palpitations   . GERD (gastroesophageal reflux disease)   . Anxiety   . Colon polyp   . Irritable bowel syndrome   . Back pain, chronic   . Osteoporosis   . Psoriasis     Past Surgical History  Procedure Laterality Date  . Foot fracture surgery      left foot  . Knee arthroscopy      left  . Back surgery       Current Outpatient Prescriptions  Medication Sig Dispense Refill  . ALPRAZolam (XANAX) 0.5 MG tablet Take 0.5 mg by mouth at bedtime as needed for sleep.     Marland Kitchen aspirin EC 81 MG tablet Take 81 mg by mouth daily.    Marland Kitchen doxazosin (CARDURA) 4  MG tablet Take 4 mg by mouth at bedtime.      . folic acid (FOLVITE) 1 MG tablet Take 1 mg by mouth daily.      Marland Kitchen lisinopril (PRINIVIL,ZESTRIL) 10 MG tablet Take 10 mg by mouth daily.    . methotrexate (RHEUMATREX) 2.5 MG tablet Take 2.5 mg by mouth as needed. Take 3 tab weekly Caution:Chemotherapy. Protect from light.    . metoprolol tartrate (LOPRESSOR) 25 MG tablet Take 1 tablet (25 mg total) by mouth 2 (two) times daily. 60 tablet 0  . oxyCODONE-acetaminophen (PERCOCET) 10-325 MG per tablet Take 1 tablet by mouth every 4 (four) hours as needed for pain.    . pantoprazole (PROTONIX) 40 MG tablet Take 1 tablet (40 mg total) by mouth 2 (two) times daily. 60 tablet 11  . pravastatin (PRAVACHOL) 20 MG tablet Take 20 mg by mouth daily.      . Probiotic Product (PROBIOTIC PO) Take 1 capsule by mouth daily.     No current facility-administered medications for this visit.    Allergies:   Cephalexin and Contrast media    Social History:  The patient  reports that he quit smoking about 41 years ago. His smoking use included Cigarettes. He started smoking about 52 years ago. He smoked 2.00 packs per day. He has never used smokeless tobacco. He reports  that he drinks about 8.4 oz of alcohol per week. He reports that he does not use illicit drugs.   Family History:  The patient's family history includes Arrhythmia in his mother and sister; Cancer in his brother and father; Stroke in his sister. There is no history of Colon cancer.    ROS: .   All other systems are reviewed and negative.Unless otherwise mentioned in H&P above.   PHYSICAL EXAM: VS:  BP 116/68 mmHg  Pulse 72  Ht 5\' 11"  (1.803 m)  Wt 277 lb (125.646 kg)  BMI 38.65 kg/m2 , BMI Body mass index is 38.65 kg/(m^2). GEN: Well nourished, well developed, in no acute distress HEENT: normal Neck: no JVD, carotid bruits, or masses Cardiac: RRR; no murmurs, rubs, or gallops,no edema  Respiratory:  clear to auscultation bilaterally, normal  work of breathing GI: soft, nontender, nondistended, + BS MS: no deformity or atrophy Skin: warm and dry, no rash Neuro:  Strength and sensation are intact Psych: euthymic mood, full affect  Recent Labs: 07/27/2013: ALT 26 05/04/2014: B Natriuretic Peptide 30.0; BUN 23; Creatinine 1.09; Hemoglobin 14.0; Platelets 245; Potassium 4.3; Sodium 136 05/05/2014: TSH 0.861    Lipid Panel No results found for: CHOL, TRIG, HDL, CHOLHDL, VLDL, LDLCALC, LDLDIRECT    Wt Readings from Last 3 Encounters:  05/22/14 277 lb (125.646 kg)  05/04/14 274 lb 9.6 oz (124.558 kg)  07/22/13 261 lb 12.8 oz (118.752 kg)      Other studies Reviewed: Additional studies/ records that were reviewed today include: None Review of the above records demonstrates: None   ASSESSMENT AND PLAN:  1. Chest Pain: Atypical with out further incidences since discharge from the hospital.he offers no complaints of discomfort, with increased activity.  We will continue him on current medication regimen, and see him on a when necessary basis.he is, of course, told to come back sooner should he have recurrence of symptoms.  At that point, we may decide to do a stress test.  2. Obesity: He is advisedto continue walking regimen, which he has recently started.  [place].  He is also beginning to watch what he eats.  I have encouraged him to continue this healthy lifestyle change.  He will followup with his primary care physician, and will be seen again on a when necessary basis.  Current medicines are reviewed at length with the patient today.    Labs/ tests ordered today include: None No orders of the defined types were placed in this encounter.     Disposition:   FU with [place] PRN Signed, Jory Sims, NP  05/22/2014 2:04 PM    [facility] [address] [place], [place] [address] Phone: [phone]; Fax: [phone]

## 2014-05-24 DIAGNOSIS — E669 Obesity, unspecified: Secondary | ICD-10-CM | POA: Diagnosis not present

## 2014-05-24 DIAGNOSIS — M5116 Intervertebral disc disorders with radiculopathy, lumbar region: Secondary | ICD-10-CM | POA: Diagnosis not present

## 2014-05-24 DIAGNOSIS — M488X6 Other specified spondylopathies, lumbar region: Secondary | ICD-10-CM | POA: Diagnosis not present

## 2014-05-24 DIAGNOSIS — M47816 Spondylosis without myelopathy or radiculopathy, lumbar region: Secondary | ICD-10-CM | POA: Diagnosis not present

## 2014-07-05 DIAGNOSIS — Z79899 Other long term (current) drug therapy: Secondary | ICD-10-CM | POA: Diagnosis not present

## 2014-07-05 DIAGNOSIS — L409 Psoriasis, unspecified: Secondary | ICD-10-CM | POA: Diagnosis not present

## 2014-07-07 ENCOUNTER — Encounter: Payer: Self-pay | Admitting: Gastroenterology

## 2014-07-17 DIAGNOSIS — J019 Acute sinusitis, unspecified: Secondary | ICD-10-CM | POA: Diagnosis not present

## 2014-07-17 DIAGNOSIS — Z6841 Body Mass Index (BMI) 40.0 and over, adult: Secondary | ICD-10-CM | POA: Diagnosis not present

## 2014-07-17 DIAGNOSIS — I10 Essential (primary) hypertension: Secondary | ICD-10-CM | POA: Diagnosis not present

## 2014-07-31 ENCOUNTER — Other Ambulatory Visit: Payer: Self-pay

## 2014-09-04 DIAGNOSIS — N411 Chronic prostatitis: Secondary | ICD-10-CM | POA: Diagnosis not present

## 2014-09-04 DIAGNOSIS — N4340 Spermatocele of epididymis, unspecified: Secondary | ICD-10-CM | POA: Diagnosis not present

## 2014-09-08 DIAGNOSIS — E785 Hyperlipidemia, unspecified: Secondary | ICD-10-CM | POA: Diagnosis not present

## 2014-09-08 DIAGNOSIS — Z1389 Encounter for screening for other disorder: Secondary | ICD-10-CM | POA: Diagnosis not present

## 2014-09-08 DIAGNOSIS — L309 Dermatitis, unspecified: Secondary | ICD-10-CM | POA: Diagnosis not present

## 2014-09-08 DIAGNOSIS — I10 Essential (primary) hypertension: Secondary | ICD-10-CM | POA: Diagnosis not present

## 2014-09-08 DIAGNOSIS — E782 Mixed hyperlipidemia: Secondary | ICD-10-CM | POA: Diagnosis not present

## 2014-09-08 DIAGNOSIS — Z6839 Body mass index (BMI) 39.0-39.9, adult: Secondary | ICD-10-CM | POA: Diagnosis not present

## 2014-09-13 DIAGNOSIS — E039 Hypothyroidism, unspecified: Secondary | ICD-10-CM | POA: Diagnosis not present

## 2014-09-13 DIAGNOSIS — R5383 Other fatigue: Secondary | ICD-10-CM | POA: Diagnosis not present

## 2014-09-19 DIAGNOSIS — J0100 Acute maxillary sinusitis, unspecified: Secondary | ICD-10-CM | POA: Diagnosis not present

## 2014-09-19 DIAGNOSIS — J069 Acute upper respiratory infection, unspecified: Secondary | ICD-10-CM | POA: Diagnosis not present

## 2014-09-19 DIAGNOSIS — H6501 Acute serous otitis media, right ear: Secondary | ICD-10-CM | POA: Diagnosis not present

## 2014-09-19 DIAGNOSIS — Z6839 Body mass index (BMI) 39.0-39.9, adult: Secondary | ICD-10-CM | POA: Diagnosis not present

## 2014-09-19 DIAGNOSIS — Z1389 Encounter for screening for other disorder: Secondary | ICD-10-CM | POA: Diagnosis not present

## 2014-10-18 ENCOUNTER — Other Ambulatory Visit: Payer: Self-pay | Admitting: Neurosurgery

## 2014-10-18 DIAGNOSIS — M47816 Spondylosis without myelopathy or radiculopathy, lumbar region: Secondary | ICD-10-CM

## 2014-10-23 DIAGNOSIS — R221 Localized swelling, mass and lump, neck: Secondary | ICD-10-CM | POA: Diagnosis not present

## 2014-10-23 DIAGNOSIS — Z6839 Body mass index (BMI) 39.0-39.9, adult: Secondary | ICD-10-CM | POA: Diagnosis not present

## 2014-10-23 DIAGNOSIS — G4700 Insomnia, unspecified: Secondary | ICD-10-CM | POA: Diagnosis not present

## 2014-10-23 DIAGNOSIS — Z1389 Encounter for screening for other disorder: Secondary | ICD-10-CM | POA: Diagnosis not present

## 2014-10-23 DIAGNOSIS — D72819 Decreased white blood cell count, unspecified: Secondary | ICD-10-CM | POA: Diagnosis not present

## 2014-10-23 DIAGNOSIS — E0590 Thyrotoxicosis, unspecified without thyrotoxic crisis or storm: Secondary | ICD-10-CM | POA: Diagnosis not present

## 2014-10-26 ENCOUNTER — Other Ambulatory Visit (HOSPITAL_COMMUNITY): Payer: Self-pay | Admitting: Family Medicine

## 2014-10-26 ENCOUNTER — Ambulatory Visit
Admission: RE | Admit: 2014-10-26 | Discharge: 2014-10-26 | Disposition: A | Discharge status: Home / Self Care | Payer: Medicare Other | Source: Ambulatory Visit | Attending: Neurosurgery | Admitting: Neurosurgery

## 2014-10-26 ENCOUNTER — Other Ambulatory Visit: Payer: Self-pay | Admitting: Neurosurgery

## 2014-10-26 VITALS — BP 162/78 | HR 62

## 2014-10-26 DIAGNOSIS — M47817 Spondylosis without myelopathy or radiculopathy, lumbosacral region: Secondary | ICD-10-CM | POA: Diagnosis not present

## 2014-10-26 DIAGNOSIS — E0590 Thyrotoxicosis, unspecified without thyrotoxic crisis or storm: Secondary | ICD-10-CM

## 2014-10-26 DIAGNOSIS — M47816 Spondylosis without myelopathy or radiculopathy, lumbar region: Secondary | ICD-10-CM

## 2014-10-26 DIAGNOSIS — M545 Low back pain: Secondary | ICD-10-CM | POA: Diagnosis not present

## 2014-10-26 DIAGNOSIS — R29898 Other symptoms and signs involving the musculoskeletal system: Secondary | ICD-10-CM

## 2014-10-26 MED ORDER — METHYLPREDNISOLONE ACETATE 40 MG/ML INJ SUSP (RADIOLOG
120.0000 mg | Freq: Once | INTRAMUSCULAR | Status: AC
  Administered 2014-10-26: 120 mg via EPIDURAL

## 2014-10-26 MED ORDER — IOHEXOL 180 MG/ML  SOLN
1.0000 mL | Freq: Once | INTRAMUSCULAR | Status: DC | PRN
  Administered 2014-10-26: 1 mL via EPIDURAL

## 2014-10-26 NOTE — Discharge Instructions (Signed)

## 2014-10-30 ENCOUNTER — Ambulatory Visit (HOSPITAL_COMMUNITY)
Admission: RE | Admit: 2014-10-30 | Discharge: 2014-10-30 | Disposition: A | Discharge status: Home / Self Care | Payer: Medicare Other | Source: Ambulatory Visit | Attending: Family Medicine | Admitting: Family Medicine

## 2014-10-30 DIAGNOSIS — E0590 Thyrotoxicosis, unspecified without thyrotoxic crisis or storm: Secondary | ICD-10-CM | POA: Diagnosis not present

## 2014-10-30 DIAGNOSIS — E010 Iodine-deficiency related diffuse (endemic) goiter: Secondary | ICD-10-CM | POA: Insufficient documentation

## 2014-11-17 DIAGNOSIS — I10 Essential (primary) hypertension: Secondary | ICD-10-CM | POA: Diagnosis not present

## 2014-11-17 DIAGNOSIS — F419 Anxiety disorder, unspecified: Secondary | ICD-10-CM | POA: Diagnosis not present

## 2014-11-17 DIAGNOSIS — K589 Irritable bowel syndrome without diarrhea: Secondary | ICD-10-CM | POA: Diagnosis not present

## 2014-11-17 DIAGNOSIS — Z1389 Encounter for screening for other disorder: Secondary | ICD-10-CM | POA: Diagnosis not present

## 2014-11-17 DIAGNOSIS — K219 Gastro-esophageal reflux disease without esophagitis: Secondary | ICD-10-CM | POA: Diagnosis not present

## 2014-11-17 DIAGNOSIS — G4733 Obstructive sleep apnea (adult) (pediatric): Secondary | ICD-10-CM | POA: Diagnosis not present

## 2014-11-17 DIAGNOSIS — E785 Hyperlipidemia, unspecified: Secondary | ICD-10-CM | POA: Diagnosis not present

## 2014-11-17 DIAGNOSIS — K635 Polyp of colon: Secondary | ICD-10-CM | POA: Diagnosis not present

## 2014-11-17 DIAGNOSIS — L409 Psoriasis, unspecified: Secondary | ICD-10-CM | POA: Diagnosis not present

## 2014-11-17 DIAGNOSIS — Z6838 Body mass index (BMI) 38.0-38.9, adult: Secondary | ICD-10-CM | POA: Diagnosis not present

## 2014-11-17 DIAGNOSIS — E042 Nontoxic multinodular goiter: Secondary | ICD-10-CM | POA: Diagnosis not present

## 2014-11-19 ENCOUNTER — Emergency Department (HOSPITAL_COMMUNITY): Payer: Medicare Other

## 2014-11-19 ENCOUNTER — Encounter (HOSPITAL_COMMUNITY): Payer: Self-pay | Admitting: Emergency Medicine

## 2014-11-19 ENCOUNTER — Emergency Department (HOSPITAL_COMMUNITY)
Admission: EM | Admit: 2014-11-19 | Discharge: 2014-11-19 | Disposition: A | Discharge status: Home / Self Care | Payer: Medicare Other | Attending: Emergency Medicine | Admitting: Emergency Medicine

## 2014-11-19 DIAGNOSIS — Z872 Personal history of diseases of the skin and subcutaneous tissue: Secondary | ICD-10-CM | POA: Diagnosis not present

## 2014-11-19 DIAGNOSIS — Z87891 Personal history of nicotine dependence: Secondary | ICD-10-CM | POA: Insufficient documentation

## 2014-11-19 DIAGNOSIS — G8929 Other chronic pain: Secondary | ICD-10-CM | POA: Diagnosis not present

## 2014-11-19 DIAGNOSIS — Z86010 Personal history of colonic polyps: Secondary | ICD-10-CM | POA: Insufficient documentation

## 2014-11-19 DIAGNOSIS — M545 Low back pain: Secondary | ICD-10-CM | POA: Diagnosis not present

## 2014-11-19 DIAGNOSIS — E7800 Pure hypercholesterolemia, unspecified: Secondary | ICD-10-CM | POA: Diagnosis not present

## 2014-11-19 DIAGNOSIS — K5732 Diverticulitis of large intestine without perforation or abscess without bleeding: Secondary | ICD-10-CM | POA: Insufficient documentation

## 2014-11-19 DIAGNOSIS — K219 Gastro-esophageal reflux disease without esophagitis: Secondary | ICD-10-CM | POA: Insufficient documentation

## 2014-11-19 DIAGNOSIS — I10 Essential (primary) hypertension: Secondary | ICD-10-CM | POA: Diagnosis not present

## 2014-11-19 DIAGNOSIS — Z79899 Other long term (current) drug therapy: Secondary | ICD-10-CM | POA: Insufficient documentation

## 2014-11-19 DIAGNOSIS — M549 Dorsalgia, unspecified: Secondary | ICD-10-CM | POA: Diagnosis not present

## 2014-11-19 DIAGNOSIS — Z7982 Long term (current) use of aspirin: Secondary | ICD-10-CM | POA: Diagnosis not present

## 2014-11-19 DIAGNOSIS — R109 Unspecified abdominal pain: Secondary | ICD-10-CM | POA: Diagnosis not present

## 2014-11-19 LAB — CBC WITH DIFFERENTIAL/PLATELET
BASOS PCT: 0 %
Basophils Absolute: 0 10*3/uL (ref 0.0–0.1)
EOS ABS: 0.2 10*3/uL (ref 0.0–0.7)
Eosinophils Relative: 2 %
HEMATOCRIT: 37.7 % — AB (ref 39.0–52.0)
HEMOGLOBIN: 13 g/dL (ref 13.0–17.0)
LYMPHS PCT: 21 %
Lymphs Abs: 1.9 10*3/uL (ref 0.7–4.0)
MCH: 33.5 pg (ref 26.0–34.0)
MCHC: 34.5 g/dL (ref 30.0–36.0)
MCV: 97.2 fL (ref 78.0–100.0)
MONO ABS: 0.9 10*3/uL (ref 0.1–1.0)
Monocytes Relative: 10 %
Neutro Abs: 6.1 10*3/uL (ref 1.7–7.7)
Neutrophils Relative %: 67 %
Platelets: 217 10*3/uL (ref 150–400)
RBC: 3.88 MIL/uL — ABNORMAL LOW (ref 4.22–5.81)
RDW: 13.2 % (ref 11.5–15.5)
WBC: 9 10*3/uL (ref 4.0–10.5)

## 2014-11-19 LAB — COMPREHENSIVE METABOLIC PANEL
ALBUMIN: 3.9 g/dL (ref 3.5–5.0)
ALK PHOS: 51 U/L (ref 38–126)
ALT: 19 U/L (ref 17–63)
ANION GAP: 9 (ref 5–15)
AST: 18 U/L (ref 15–41)
BILIRUBIN TOTAL: 1.2 mg/dL (ref 0.3–1.2)
BUN: 18 mg/dL (ref 6–20)
CALCIUM: 8.9 mg/dL (ref 8.9–10.3)
CO2: 23 mmol/L (ref 22–32)
Chloride: 104 mmol/L (ref 101–111)
Creatinine, Ser: 1.27 mg/dL — ABNORMAL HIGH (ref 0.61–1.24)
GFR calc non Af Amer: 56 mL/min — ABNORMAL LOW (ref >60)
Glucose, Bld: 105 mg/dL — ABNORMAL HIGH (ref 65–99)
POTASSIUM: 3.6 mmol/L (ref 3.5–5.1)
SODIUM: 136 mmol/L (ref 135–145)
TOTAL PROTEIN: 7 g/dL (ref 6.5–8.1)

## 2014-11-19 LAB — URINALYSIS, ROUTINE W REFLEX MICROSCOPIC
BILIRUBIN URINE: NEGATIVE
Glucose, UA: NEGATIVE mg/dL
HGB URINE DIPSTICK: NEGATIVE
KETONES UR: 15 mg/dL — AB
Leukocytes, UA: NEGATIVE
NITRITE: NEGATIVE
PROTEIN: NEGATIVE mg/dL
Specific Gravity, Urine: 1.015 (ref 1.005–1.030)
UROBILINOGEN UA: 0.2 mg/dL (ref 0.0–1.0)
pH: 5.5 (ref 5.0–8.0)

## 2014-11-19 LAB — LIPASE, BLOOD: Lipase: 20 U/L — ABNORMAL LOW (ref 22–51)

## 2014-11-19 MED ORDER — ONDANSETRON HCL 4 MG/2ML IJ SOLN
4.0000 mg | Freq: Once | INTRAMUSCULAR | Status: AC
  Administered 2014-11-19: 4 mg via INTRAVENOUS
  Filled 2014-11-19: qty 2

## 2014-11-19 MED ORDER — METRONIDAZOLE 500 MG PO TABS: 500.0000 mg | ORAL_TABLET | Freq: Two times a day (BID) | ORAL | Status: AC

## 2014-11-19 MED ORDER — CIPROFLOXACIN IN D5W 400 MG/200ML IV SOLN
400.0000 mg | Freq: Once | INTRAVENOUS | Status: AC
  Administered 2014-11-19: 400 mg via INTRAVENOUS
  Filled 2014-11-19: qty 200

## 2014-11-19 MED ORDER — FENTANYL CITRATE (PF) 100 MCG/2ML IJ SOLN
50.0000 ug | Freq: Once | INTRAMUSCULAR | Status: AC
  Administered 2014-11-19: 50 ug via INTRAVENOUS
  Filled 2014-11-19: qty 2

## 2014-11-19 MED ORDER — SODIUM CHLORIDE 0.9 % IV SOLN
1000.0000 mL | INTRAVENOUS | Status: DC
  Administered 2014-11-19: 1000 mL via INTRAVENOUS

## 2014-11-19 MED ORDER — METRONIDAZOLE IN NACL 5-0.79 MG/ML-% IV SOLN
500.0000 mg | Freq: Once | INTRAVENOUS | Status: AC
  Administered 2014-11-19: 500 mg via INTRAVENOUS
  Filled 2014-11-19: qty 100

## 2014-11-19 MED ORDER — CIPROFLOXACIN HCL 500 MG PO TABS: 500.0000 mg | ORAL_TABLET | Freq: Two times a day (BID) | ORAL | Status: AC

## 2014-11-19 NOTE — ED Notes (Addendum)
Patient c/o lower abd and back pain. Per patient "feels bloated and tight in abd." Patient reports that he has been unable to have BM in 3 days which is not normal for him. Reports nausea but no vomiting or fevers. Per patient taking oxycodone with no relief.

## 2014-11-19 NOTE — ED Provider Notes (Signed)
CSN: [id number]     Arrival date & time 11/19/14  1615 History   First MD Initiated Contact with Patient 11/19/14 1624     Chief Complaint  Patient presents with  . Abdominal Pain    HPI  Patient presents with concern of new abdominal pain, decreased bowel movements. Symptoms began about 3 days ago, since onset have been persistent, with increased distention as well. Patient has anorexia, nausea, but no vomiting. The pain is diffuse throughout the abdomen, with associated low back pain. No bowel movement in at least one day. Note, the patient has a history of back pain, typically takes narcotic medication for this, states that he had a back pain exacerbation one week ago. He has been taking increased narcotics since that time.   Past Medical History  Diagnosis Date  . High cholesterol   . Hypertension   . Heart palpitations   . GERD (gastroesophageal reflux disease)   . Anxiety   . Colon polyp   . Irritable bowel syndrome   . Back pain, chronic   . Osteoporosis   . Psoriasis    Past Surgical History  Procedure Laterality Date  . Foot fracture surgery      left foot  . Knee arthroscopy      left  . Back surgery     Family History  Problem Relation Age of Onset  . Colon cancer Neg Hx   . Arrhythmia Mother     Atrial fib  . Arrhythmia Sister     Atrial fib  . Stroke Sister   . Cancer Father     Bladder, Deceased   . Cancer Brother    Social History  Substance Use Topics  . Smoking status: Former Smoker -- 2.00 packs/day    Types: Cigarettes    Start date: 05/22/1962    Quit date: 05/21/1973  . Smokeless tobacco: Never Used     Comment: Quit 25 yrs now   . Alcohol Use: 8.4 oz/week    14 Shots of liquor per week     Comment: Rare     Review of Systems  Constitutional:       Per HPI, otherwise negative  HENT:       Per HPI, otherwise negative  Respiratory:       Per HPI, otherwise negative  Cardiovascular:       Per HPI, otherwise negative   Gastrointestinal: Positive for nausea. Negative for vomiting.  Endocrine:       Negative aside from HPI  Genitourinary:       Neg aside from HPI   Musculoskeletal:       Per HPI, otherwise negative  Skin: Negative.   Neurological: Negative for syncope.      Allergies  Cephalexin and Contrast media  Home Medications   Prior to Admission medications   Medication Sig Start Date End Date Taking? Authorizing Provider  ALPRAZolam Duanne Moron) 0.5 MG tablet Take 0.25-0.5 mg by mouth at bedtime as needed for sleep.    Yes Historical Provider, MD  aspirin EC 81 MG tablet Take 81 mg by mouth daily.   Yes Historical Provider, MD  clotrimazole-betamethasone (LOTRISONE) cream Apply 1 application topically daily as needed (FOR IRRITATION).   Yes Historical Provider, MD  doxazosin (CARDURA) 4 MG tablet Take 4 mg by mouth at bedtime.     Yes Historical Provider, MD  folic acid (FOLVITE) 1 MG tablet Take 1 mg by mouth every evening.    Yes Historical Provider, MD  lisinopril (PRINIVIL,ZESTRIL) 10 MG tablet Take 10 mg by mouth every evening.    Yes Historical Provider, MD  methotrexate (RHEUMATREX) 2.5 MG tablet Take 7.5 mg by mouth once a week. Take 3 tab weekly Caution:Chemotherapy. Protect from light.   Yes Historical Provider, MD  metoprolol tartrate (LOPRESSOR) 25 MG tablet Take 1 tablet (25 mg total) by mouth 2 (two) times daily. 05/05/14  Yes Radene Gunning, NP  oxyCODONE-acetaminophen (PERCOCET/ROXICET) 5-325 MG tablet Take 1-2 tablets by mouth every 4 (four) hours as needed for moderate pain or severe pain.   Yes Historical Provider, MD  pantoprazole (PROTONIX) 40 MG tablet Take 1 tablet (40 mg total) by mouth 2 (two) times daily. Patient taking differently: Take 40 mg by mouth every morning.  10/07/11  Yes Ladene Artist, MD  pravastatin (PRAVACHOL) 20 MG tablet Take 20 mg by mouth every evening.    Yes Historical Provider, MD  Probiotic Product (PROBIOTIC PO) Take 1 capsule by mouth daily.   Yes  Historical Provider, MD  ciprofloxacin (CIPRO) 500 MG tablet Take 500 mg by mouth 2 (two) times daily. 10 day course 11/16/14   Historical Provider, MD   BP 118/60 mmHg  Pulse 64  Temp(Src) 98.3 F (36.8 C) (Oral)  Resp 18  Ht 5\' 11"  (1.803 m)  Wt 274 lb (124.286 kg)  BMI 38.23 kg/m2  SpO2 94% Physical Exam  Constitutional: He is oriented to person, place, and time. He appears well-developed. No distress.  HENT:  Head: Normocephalic and atraumatic.  Eyes: Conjunctivae and EOM are normal.  Cardiovascular: Normal rate and regular rhythm.   Pulmonary/Chest: Effort normal. No stridor. No respiratory distress.  Abdominal: He exhibits distension. He exhibits no fluid wave, no ascites and no mass. There is generalized tenderness.  Musculoskeletal: He exhibits no edema.  Neurological: He is alert and oriented to person, place, and time.  Skin: Skin is warm and dry.  Psychiatric: He has a normal mood and affect.  Nursing note and vitals reviewed.   ED Course  Procedures (including critical care time) Labs Review Labs Reviewed  CBC WITH DIFFERENTIAL/PLATELET - Abnormal; Notable for the following:    RBC 3.88 (*)    HCT 37.7 (*)    All other components within normal limits  COMPREHENSIVE METABOLIC PANEL - Abnormal; Notable for the following:    Glucose, Bld 105 (*)    Creatinine, Ser 1.27 (*)    GFR calc non Af Amer 56 (*)    All other components within normal limits  LIPASE, BLOOD - Abnormal; Notable for the following:    Lipase 20 (*)    All other components within normal limits  URINALYSIS, ROUTINE W REFLEX MICROSCOPIC (NOT AT [facility]) - Abnormal; Notable for the following:    Ketones, ur 15 (*)    All other components within normal limits    Imaging Review Ct Abdomen Pelvis Wo Contrast  11/19/2014  CLINICAL DATA:  Abdominal and back pain, constipation EXAM: CT ABDOMEN AND PELVIS WITHOUT CONTRAST TECHNIQUE: Multidetector CT imaging of the abdomen and pelvis was performed  following the standard protocol without IV contrast. COMPARISON:  07/29/2013 FINDINGS: Lower chest: Stable mild pericardial thickening or tiny pericardial effusion. Hepatobiliary: Tiny focus of calcification in the right lobe of the liver stable. No other abnormalities. Gallbladder normal. Pancreas: Normal Spleen: Normal Adrenals/Urinary Tract: Adrenal glands are normal. Chronic appearing bilateral perinephric inflammatory change. No renal or ureteral calculi. Mild focal dilatation distal third right ureter similar to prior study likely not  of acute significance. Bladder normal. Stomach/Bowel: Diverticulosis sigmoid colon with very mild increased attenuation in the surrounding mesenteric fat. Vascular/Lymphatic: No acute findings Reproductive: Negative Other: No ascites Musculoskeletal: No acute findings IMPRESSION: Significant diverticulosis of the sigmoid colon with trace minimal adjacent inflammation suggesting diverticulitis. Electronically Signed   By: Skipper Cliche M.D.   On: 11/19/2014 19:27   I have personally reviewed and evaluated these images and lab results as part of my medical decision-making.   EKG Interpretation None     On repeat exam after initiation of antibiotics, the patient appears better, has had a bowel movement, states that he feels better. He voiced an understanding of all instructions, return precautions. MDM   Final diagnoses:  Pain in the abdomen   Patient presents with increased abdominal distention, pain, lack of bowel movements. With these concerns, obstruction is diverticulitis versus colitis were considerations. Patient was afebrile, no evidence peritonitis, and after initiation of antibiotics for diverticulitis, he improved. With this improvement, no other evidence for bacteremia or sepsis, he was discharged in stable condition to follow-up with his gastroenterologist.  Carmin Muskrat, MD 11/19/14 2132

## 2014-11-19 NOTE — Discharge Instructions (Signed)
Diverticulitis °Diverticulitis is inflammation or infection of small pouches in your colon that form when you have a condition called diverticulosis. The pouches in your colon are called diverticula. Your colon, or large intestine, is where water is absorbed and stool is formed. °Complications of diverticulitis can include: °· Bleeding. °· Severe infection. °· Severe pain. °· Perforation of your colon. °· Obstruction of your colon. °CAUSES  °Diverticulitis is caused by bacteria. °Diverticulitis happens when stool becomes trapped in diverticula. This allows bacteria to grow in the diverticula, which can lead to inflammation and infection. °RISK FACTORS °People with diverticulosis are at risk for diverticulitis. Eating a diet that does not include enough fiber from fruits and vegetables may make diverticulitis more likely to develop. °SYMPTOMS  °Symptoms of diverticulitis may include: °· Abdominal pain and tenderness. The pain is normally located on the left side of the abdomen, but may occur in other areas. °· Fever and chills. °· Bloating. °· Cramping. °· Nausea. °· Vomiting. °· Constipation. °· Diarrhea. °· Blood in your stool. °DIAGNOSIS  °Your health care provider will ask you about your medical history and do a physical exam. You may need to have tests done because many medical conditions can cause the same symptoms as diverticulitis. Tests may include: °· Blood tests. °· Urine tests. °· Imaging tests of the abdomen, including X-rays and CT scans. °When your condition is under control, your health care provider may recommend that you have a colonoscopy. A colonoscopy can show how severe your diverticula are and whether something else is causing your symptoms. °TREATMENT  °Most cases of diverticulitis are mild and can be treated at home. Treatment may include: °· Taking over-the-counter pain medicines. °· Following a clear liquid diet. °· Taking antibiotic medicines by mouth for 7-10 days. °More severe cases may  be treated at a hospital. Treatment may include: °· Not eating or drinking. °· Taking prescription pain medicine. °· Receiving antibiotic medicines through an IV tube. °· Receiving fluids and nutrition through an IV tube. °· Surgery. °HOME CARE INSTRUCTIONS  °· Follow your health care provider's instructions carefully. °· Follow a full liquid diet or other diet as directed by your health care provider. After your symptoms improve, your health care provider may tell you to change your diet. He or she may recommend you eat a high-fiber diet. Fruits and vegetables are good sources of fiber. Fiber makes it easier to pass stool. °· Take fiber supplements or probiotics as directed by your health care provider. °· Only take medicines as directed by your health care provider. °· Keep all your follow-up appointments. °SEEK MEDICAL CARE IF:  °· Your pain does not improve. °· You have a hard time eating food. °· Your bowel movements do not return to normal. °SEEK IMMEDIATE MEDICAL CARE IF:  °· Your pain becomes worse. °· Your symptoms do not get better. °· Your symptoms suddenly get worse. °· You have a fever. °· You have repeated vomiting. °· You have bloody or black, tarry stools. °MAKE SURE YOU:  °· Understand these instructions. °· Will watch your condition. °· Will get help right away if you are not doing well or get worse. °  °This information is not intended to replace advice given to you by your health care provider. Make sure you discuss any questions you have with your health care provider. °  °Document Released: 10/30/2004 Document Revised: 01/25/2013 Document Reviewed: 12/15/2012 °Elsevier Interactive Patient Education ©2016 [employer] ° °

## 2014-12-12 DIAGNOSIS — Z1389 Encounter for screening for other disorder: Secondary | ICD-10-CM | POA: Diagnosis not present

## 2014-12-12 DIAGNOSIS — K5732 Diverticulitis of large intestine without perforation or abscess without bleeding: Secondary | ICD-10-CM | POA: Diagnosis not present

## 2014-12-12 DIAGNOSIS — Z6839 Body mass index (BMI) 39.0-39.9, adult: Secondary | ICD-10-CM | POA: Diagnosis not present

## 2014-12-12 DIAGNOSIS — Z23 Encounter for immunization: Secondary | ICD-10-CM | POA: Diagnosis not present

## 2014-12-15 ENCOUNTER — Ambulatory Visit (HOSPITAL_COMMUNITY)
Admission: RE | Admit: 2014-12-15 | Discharge: 2014-12-15 | Disposition: A | Discharge status: Home / Self Care | Payer: Medicare Other | Source: Ambulatory Visit | Attending: Family Medicine | Admitting: Family Medicine

## 2014-12-15 ENCOUNTER — Other Ambulatory Visit (HOSPITAL_COMMUNITY): Payer: Self-pay | Admitting: Family Medicine

## 2014-12-15 DIAGNOSIS — R109 Unspecified abdominal pain: Secondary | ICD-10-CM | POA: Insufficient documentation

## 2014-12-15 DIAGNOSIS — Z1389 Encounter for screening for other disorder: Secondary | ICD-10-CM | POA: Diagnosis not present

## 2014-12-15 DIAGNOSIS — K5732 Diverticulitis of large intestine without perforation or abscess without bleeding: Secondary | ICD-10-CM

## 2014-12-15 DIAGNOSIS — Z8719 Personal history of other diseases of the digestive system: Secondary | ICD-10-CM | POA: Insufficient documentation

## 2014-12-15 DIAGNOSIS — Z6839 Body mass index (BMI) 39.0-39.9, adult: Secondary | ICD-10-CM | POA: Diagnosis not present

## 2014-12-15 DIAGNOSIS — K5720 Diverticulitis of large intestine with perforation and abscess without bleeding: Secondary | ICD-10-CM

## 2015-01-15 ENCOUNTER — Other Ambulatory Visit (HOSPITAL_COMMUNITY): Payer: Self-pay | Admitting: Family Medicine

## 2015-01-15 DIAGNOSIS — Z1389 Encounter for screening for other disorder: Secondary | ICD-10-CM | POA: Diagnosis not present

## 2015-01-15 DIAGNOSIS — Z23 Encounter for immunization: Secondary | ICD-10-CM | POA: Diagnosis not present

## 2015-01-15 DIAGNOSIS — R1011 Right upper quadrant pain: Secondary | ICD-10-CM

## 2015-01-15 DIAGNOSIS — Z6839 Body mass index (BMI) 39.0-39.9, adult: Secondary | ICD-10-CM | POA: Diagnosis not present

## 2015-01-18 ENCOUNTER — Ambulatory Visit (HOSPITAL_COMMUNITY)
Admission: RE | Admit: 2015-01-18 | Discharge: 2015-01-18 | Disposition: A | Discharge status: Home / Self Care | Payer: Medicare Other | Source: Ambulatory Visit | Attending: Family Medicine | Admitting: Family Medicine

## 2015-01-18 DIAGNOSIS — R1011 Right upper quadrant pain: Secondary | ICD-10-CM | POA: Diagnosis not present

## 2015-01-18 DIAGNOSIS — K760 Fatty (change of) liver, not elsewhere classified: Secondary | ICD-10-CM | POA: Diagnosis not present

## 2015-01-18 DIAGNOSIS — K219 Gastro-esophageal reflux disease without esophagitis: Secondary | ICD-10-CM | POA: Insufficient documentation

## 2015-01-24 ENCOUNTER — Emergency Department (HOSPITAL_COMMUNITY)
Admission: EM | Admit: 2015-01-24 | Discharge: 2015-01-24 | Disposition: A | Discharge status: Home / Self Care | Payer: Medicare Other | Attending: Emergency Medicine | Admitting: Emergency Medicine

## 2015-01-24 ENCOUNTER — Encounter (HOSPITAL_COMMUNITY): Payer: Self-pay

## 2015-01-24 ENCOUNTER — Emergency Department (HOSPITAL_COMMUNITY): Payer: Medicare Other

## 2015-01-24 DIAGNOSIS — K219 Gastro-esophageal reflux disease without esophagitis: Secondary | ICD-10-CM | POA: Diagnosis not present

## 2015-01-24 DIAGNOSIS — Z8739 Personal history of other diseases of the musculoskeletal system and connective tissue: Secondary | ICD-10-CM | POA: Insufficient documentation

## 2015-01-24 DIAGNOSIS — E7800 Pure hypercholesterolemia, unspecified: Secondary | ICD-10-CM | POA: Insufficient documentation

## 2015-01-24 DIAGNOSIS — Z7982 Long term (current) use of aspirin: Secondary | ICD-10-CM | POA: Diagnosis not present

## 2015-01-24 DIAGNOSIS — I10 Essential (primary) hypertension: Secondary | ICD-10-CM | POA: Diagnosis not present

## 2015-01-24 DIAGNOSIS — Z86010 Personal history of colonic polyps: Secondary | ICD-10-CM | POA: Diagnosis not present

## 2015-01-24 DIAGNOSIS — Z87891 Personal history of nicotine dependence: Secondary | ICD-10-CM | POA: Diagnosis not present

## 2015-01-24 DIAGNOSIS — R079 Chest pain, unspecified: Secondary | ICD-10-CM | POA: Diagnosis not present

## 2015-01-24 DIAGNOSIS — Z872 Personal history of diseases of the skin and subcutaneous tissue: Secondary | ICD-10-CM | POA: Diagnosis not present

## 2015-01-24 DIAGNOSIS — Z79899 Other long term (current) drug therapy: Secondary | ICD-10-CM | POA: Insufficient documentation

## 2015-01-24 DIAGNOSIS — G8929 Other chronic pain: Secondary | ICD-10-CM | POA: Insufficient documentation

## 2015-01-24 DIAGNOSIS — F419 Anxiety disorder, unspecified: Secondary | ICD-10-CM | POA: Insufficient documentation

## 2015-01-24 DIAGNOSIS — R0789 Other chest pain: Secondary | ICD-10-CM | POA: Diagnosis not present

## 2015-01-24 LAB — HEPATIC FUNCTION PANEL
ALBUMIN: 3.9 g/dL (ref 3.5–5.0)
ALK PHOS: 58 U/L (ref 38–126)
ALT: 28 U/L (ref 17–63)
AST: 18 U/L (ref 15–41)
BILIRUBIN DIRECT: 0.2 mg/dL (ref 0.1–0.5)
BILIRUBIN TOTAL: 0.9 mg/dL (ref 0.3–1.2)
Indirect Bilirubin: 0.7 mg/dL (ref 0.3–0.9)
Total Protein: 6.7 g/dL (ref 6.5–8.1)

## 2015-01-24 LAB — BASIC METABOLIC PANEL
ANION GAP: 6 (ref 5–15)
BUN: 19 mg/dL (ref 6–20)
CALCIUM: 8.9 mg/dL (ref 8.9–10.3)
CO2: 27 mmol/L (ref 22–32)
CREATININE: 1.11 mg/dL (ref 0.61–1.24)
Chloride: 106 mmol/L (ref 101–111)
GFR calc non Af Amer: >60 mL/min (ref >60)
Glucose, Bld: 98 mg/dL (ref 65–99)
Potassium: 3.8 mmol/L (ref 3.5–5.1)
SODIUM: 139 mmol/L (ref 135–145)

## 2015-01-24 LAB — CBC WITH DIFFERENTIAL/PLATELET
BASOS ABS: 0 10*3/uL (ref 0.0–0.1)
Basophils Relative: 0 %
EOS ABS: 0.3 10*3/uL (ref 0.0–0.7)
EOS PCT: 3 %
HEMATOCRIT: 40 % (ref 39.0–52.0)
Hemoglobin: 13.5 g/dL (ref 13.0–17.0)
LYMPHS ABS: 2.6 10*3/uL (ref 0.7–4.0)
LYMPHS PCT: 28 %
MCH: 32.2 pg (ref 26.0–34.0)
MCHC: 33.8 g/dL (ref 30.0–36.0)
MCV: 95.5 fL (ref 78.0–100.0)
Monocytes Absolute: 0.8 10*3/uL (ref 0.1–1.0)
Monocytes Relative: 8 %
NEUTROS PCT: 61 %
Neutro Abs: 5.7 10*3/uL (ref 1.7–7.7)
PLATELETS: 251 10*3/uL (ref 150–400)
RBC: 4.19 MIL/uL — AB (ref 4.22–5.81)
RDW: 13 % (ref 11.5–15.5)
WBC: 9.5 10*3/uL (ref 4.0–10.5)

## 2015-01-24 LAB — LIPASE, BLOOD: LIPASE: 27 U/L (ref 11–51)

## 2015-01-24 LAB — TROPONIN I: Troponin I: <0.03 ng/mL (ref <0.031)

## 2015-01-24 NOTE — ED Notes (Signed)
Pt reports woke up  With chest pain in center of chest radiating between shoulder blades.  Reports has been taking mylanta without relief.  Denies n/v, sob, or dizziness.

## 2015-01-24 NOTE — ED Notes (Signed)
Pt says has been having some pain in r abd and had an ultrasound on dec 15.  Pt says was told his aorta was enlarged and is awaiting further testing.

## 2015-01-24 NOTE — ED Provider Notes (Signed)
CSN: [id number]     Arrival date & time 01/24/15  1815 History   First MD Initiated Contact with Patient 01/24/15 1850     Chief Complaint  Patient presents with  . Chest Pain     (Consider location/radiation/quality/duration/timing/severity/associated sxs/prior Treatment) Patient is a 69 y.o. male presenting with chest pain. The history is provided by the patient.  Chest Pain Associated symptoms: no abdominal pain, no back pain, no headache, no nausea, no numbness, no shortness of breath, not vomiting and no weakness    patient had episodes of mid chest pain. His coming on. States it feels like a pressure. States good strength of back. He has had some abdominal pain also. Recently seen and had ultrasound for his right upper quadrant. Showed possible enlarging aorta but still within normal limits. No nausea vomiting. States it feels as if he burps it well go away. No diaphoresis. Nothing makes the pain better. No relief with belching. No diarrhea or constipation. No rash.  Past Medical History  Diagnosis Date  . High cholesterol   . Hypertension   . Heart palpitations   . GERD (gastroesophageal reflux disease)   . Anxiety   . Colon polyp   . Irritable bowel syndrome   . Back pain, chronic   . Osteoporosis   . Psoriasis    Past Surgical History  Procedure Laterality Date  . Foot fracture surgery      left foot  . Knee arthroscopy      left  . Back surgery     Family History  Problem Relation Age of Onset  . Colon cancer Neg Hx   . Arrhythmia Mother     Atrial fib  . Arrhythmia Sister     Atrial fib  . Stroke Sister   . Cancer Father     Bladder, Deceased   . Cancer Brother    Social History  Substance Use Topics  . Smoking status: Former Smoker -- 2.00 packs/day    Types: Cigarettes    Start date: 05/22/1962    Quit date: 05/21/1973  . Smokeless tobacco: Never Used     Comment: Quit 25 yrs now   . Alcohol Use: 8.4 oz/week    14 Shots of liquor per week   Comment: Rare     Review of Systems  Constitutional: Negative for activity change and appetite change.  Eyes: Negative for pain.  Respiratory: Negative for chest tightness and shortness of breath.   Cardiovascular: Positive for chest pain. Negative for leg swelling.  Gastrointestinal: Negative for nausea, vomiting, abdominal pain and diarrhea.  Genitourinary: Negative for flank pain.  Musculoskeletal: Negative for back pain and neck stiffness.  Skin: Negative for rash.  Neurological: Negative for weakness, numbness and headaches.  Psychiatric/Behavioral: Negative for behavioral problems.      Allergies  Cephalexin and Contrast media  Home Medications   Prior to Admission medications   Medication Sig Start Date End Date Taking? Authorizing Provider  ALPRAZolam Duanne Moron) 0.5 MG tablet Take 0.25-0.5 mg by mouth at bedtime as needed for sleep.     Historical Provider, MD  aspirin EC 81 MG tablet Take 81 mg by mouth daily.    Historical Provider, MD  ciprofloxacin (CIPRO) 500 MG tablet Take 500 mg by mouth 2 (two) times daily. 10 day course 11/16/14   Historical Provider, MD  clotrimazole-betamethasone (LOTRISONE) cream Apply 1 application topically daily as needed (FOR IRRITATION).    Historical Provider, MD  doxazosin (CARDURA) 4 MG tablet Take  4 mg by mouth at bedtime.      Historical Provider, MD  folic acid (FOLVITE) 1 MG tablet Take 1 mg by mouth every evening.     Historical Provider, MD  lisinopril (PRINIVIL,ZESTRIL) 10 MG tablet Take 10 mg by mouth every evening.     Historical Provider, MD  methotrexate (RHEUMATREX) 2.5 MG tablet Take 7.5 mg by mouth once a week. Take 3 tab weekly Caution:Chemotherapy. Protect from light.    Historical Provider, MD  metoprolol tartrate (LOPRESSOR) 25 MG tablet Take 1 tablet (25 mg total) by mouth 2 (two) times daily. 05/05/14   Radene Gunning, NP  oxyCODONE-acetaminophen (PERCOCET/ROXICET) 5-325 MG tablet Take 1-2 tablets by mouth every 4 (four)  hours as needed for moderate pain or severe pain.    Historical Provider, MD  pantoprazole (PROTONIX) 40 MG tablet Take 1 tablet (40 mg total) by mouth 2 (two) times daily. Patient taking differently: Take 40 mg by mouth every morning.  10/07/11   Ladene Artist, MD  pravastatin (PRAVACHOL) 20 MG tablet Take 20 mg by mouth every evening.     Historical Provider, MD  Probiotic Product (PROBIOTIC PO) Take 1 capsule by mouth daily.    Historical Provider, MD   BP 116/59 mmHg  Pulse 68  Temp(Src) 97.8 F (36.6 C) (Oral)  Resp 13  Ht 5\' 11"  (1.803 m)  Wt 272 lb (123.378 kg)  BMI 37.95 kg/m2  SpO2 97% Physical Exam  Constitutional: He is oriented to person, place, and time. He appears well-developed and well-nourished.  HENT:  Head: Normocephalic and atraumatic.  Eyes: Pupils are equal, round, and reactive to light.  Neck: Normal range of motion. Neck supple.  Cardiovascular: Normal rate, regular rhythm and normal heart sounds.   Pulmonary/Chest: Effort normal and breath sounds normal.  Abdominal: Soft. Bowel sounds are normal. He exhibits no distension. There is no tenderness. There is no rebound.  Musculoskeletal: Normal range of motion. He exhibits no edema.  Neurological: He is alert and oriented to person, place, and time. No cranial nerve deficit.  Skin: Skin is warm and dry.  Psychiatric: He has a normal mood and affect.  Nursing note and vitals reviewed.   ED Course  Procedures (including critical care time) Labs Review Labs Reviewed  CBC WITH DIFFERENTIAL/PLATELET - Abnormal; Notable for the following:    RBC 4.19 (*)    All other components within normal limits  TROPONIN I  BASIC METABOLIC PANEL  HEPATIC FUNCTION PANEL  LIPASE, BLOOD    Imaging Review Dg Chest Portable 1 View  01/24/2015  CLINICAL DATA:  69 year old male with chest pain insert are of the chest radiating to the shoulder blades. EXAM: PORTABLE CHEST 1 VIEW COMPARISON:  Radiograph dated 05/04/2014  FINDINGS: Single-view of the chest demonstrates stable cardiomegaly with mild increased interstitial prominence similar to prior study and likely representing a degree of congestive changes. There is no focal consolidation, pleural effusion, or pneumothorax. The osseous structures are grossly unremarkable. IMPRESSION: Cardiomegaly with a degree of congestive changes. No focal consolidation or pneumothorax. Findings are grossly stable compared the prior study. Electronically Signed   By: Anner Crete M.D.   On: 01/24/2015 18:46   I have personally reviewed and evaluated these images and lab results as part of my medical decision-making.   EKG Interpretation   Date/Time:  Wednesday January 24 2015 18:29:15 EST Ventricular Rate:  71 PR Interval:  168 QRS Duration: 113 QT Interval:  424 QTC Calculation: 461 R Axis:  28 Text Interpretation:  Sinus rhythm Incomplete right bundle branch block  Low voltage, precordial leads Baseline wander in lead(s) V1 No significant  change since last tracing Confirmed by Alzena Gerber  MD, Ovid Curd [phone]) on  01/24/2015 8:00:09 PM      MDM   Final diagnoses:  Chest pain, unspecified chest pain type    Patient with chest pain. EKG reassuring. Enzymes negative. Chest x-ray reassuring. Reviewed ultrasound of recent CTs. Doubt AAA is the cause of this pain will need following. Doubt cardiac cause or aortic cause. Will discharge home.Davonna Belling, MD 01/24/15 2037

## 2015-01-24 NOTE — Discharge Instructions (Signed)
Nonspecific Chest Pain  °Chest pain can be caused by many different conditions. There is always a chance that your pain could be related to something serious, such as a heart attack or a blood clot in your lungs. Chest pain can also be caused by conditions that are not life-threatening. If you have chest pain, it is very important to follow up with your health care provider. °CAUSES  °Chest pain can be caused by: °· Heartburn. °· Pneumonia or bronchitis. °· Anxiety or stress. °· Inflammation around your heart (pericarditis) or lung (pleuritis or pleurisy). °· A blood clot in your lung. °· A collapsed lung (pneumothorax). It can develop suddenly on its own (spontaneous pneumothorax) or from trauma to the chest. °· Shingles infection (varicella-zoster virus). °· Heart attack. °· Damage to the bones, muscles, and cartilage that make up your chest wall. This can include: °¨ Bruised bones due to injury. °¨ Strained muscles or cartilage due to frequent or repeated coughing or overwork. °¨ Fracture to one or more ribs. °¨ Sore cartilage due to inflammation (costochondritis). °RISK FACTORS  °Risk factors for chest pain may include: °· Activities that increase your risk for trauma or injury to your chest. °· Respiratory infections or conditions that cause frequent coughing. °· Medical conditions or overeating that can cause heartburn. °· Heart disease or family history of heart disease. °· Conditions or health behaviors that increase your risk of developing a blood clot. °· Having had chicken pox (varicella zoster). °SIGNS AND SYMPTOMS °Chest pain can feel like: °· Burning or tingling on the surface of your chest or deep in your chest. °· Crushing, pressure, aching, or squeezing pain. °· Dull or sharp pain that is worse when you move, cough, or take a deep breath. °· Pain that is also felt in your back, neck, shoulder, or arm, or pain that spreads to any of these areas. °Your chest pain may come and go, or it may stay  constant. °DIAGNOSIS °Lab tests or other studies may be needed to find the cause of your pain. Your health care provider may have you take a test called an ambulatory ECG (electrocardiogram). An ECG records your heartbeat patterns at the time the test is performed. You may also have other tests, such as: °· Transthoracic echocardiogram (TTE). During echocardiography, sound waves are used to create a picture of all of the heart structures and to look at how blood flows through your heart. °· Transesophageal echocardiogram (TEE). This is a more advanced imaging test that obtains images from inside your body. It allows your health care provider to see your heart in finer detail. °· Cardiac monitoring. This allows your health care provider to monitor your heart rate and rhythm in real time. °· Holter monitor. This is a portable device that records your heartbeat and can help to diagnose abnormal heartbeats. It allows your health care provider to track your heart activity for several days, if needed. °· Stress tests. These can be done through exercise or by taking medicine that makes your heart beat more quickly. °· Blood tests. °· Imaging tests. °TREATMENT  °Your treatment depends on what is causing your chest pain. Treatment may include: °· Medicines. These may include: °¨ Acid blockers for heartburn. °¨ Anti-inflammatory medicine. °¨ Pain medicine for inflammatory conditions. °¨ Antibiotic medicine, if an infection is present. °¨ Medicines to dissolve blood clots. °¨ Medicines to treat coronary artery disease. °· Supportive care for conditions that do not require medicines. This may include: °¨ Resting. °¨ Applying heat   or cold packs to injured areas. °¨ Limiting activities until pain decreases. °HOME CARE INSTRUCTIONS °· If you were prescribed an antibiotic medicine, finish it all even if you start to feel better. °· Avoid any activities that bring on chest pain. °· Do not use any tobacco products, including  cigarettes, chewing tobacco, or electronic cigarettes. If you need help quitting, ask your health care provider. °· Do not drink alcohol. °· Take medicines only as directed by your health care provider. °· Keep all follow-up visits as directed by your health care provider. This is important. This includes any further testing if your chest pain does not go away. °· If heartburn is the cause for your chest pain, you may be told to keep your head raised (elevated) while sleeping. This reduces the chance that acid will go from your stomach into your esophagus. °· Make lifestyle changes as directed by your health care provider. These may include: °¨ Getting regular exercise. Ask your health care provider to suggest some activities that are safe for you. °¨ Eating a heart-healthy diet. A registered dietitian can help you to learn healthy eating options. °¨ Maintaining a healthy weight. °¨ Managing diabetes, if necessary. °¨ Reducing stress. °SEEK MEDICAL CARE IF: °· Your chest pain does not go away after treatment. °· You have a rash with blisters on your chest. °· You have a fever. °SEEK IMMEDIATE MEDICAL CARE IF:  °· Your chest pain is worse. °· You have an increasing cough, or you cough up blood. °· You have severe abdominal pain. °· You have severe weakness. °· You faint. °· You have chills. °· You have sudden, unexplained chest discomfort. °· You have sudden, unexplained discomfort in your arms, back, neck, or jaw. °· You have shortness of breath at any time. °· You suddenly start to sweat, or your skin gets clammy. °· You feel nauseous or you vomit. °· You suddenly feel light-headed or dizzy. °· Your heart begins to beat quickly, or it feels like it is skipping beats. °These symptoms may represent a serious problem that is an emergency. Do not wait to see if the symptoms will go away. Get medical help right away. Call your local emergency services (911 in the [place] Do not drive yourself to the hospital. °  °This  information is not intended to replace advice given to you by your health care provider. Make sure you discuss any questions you have with your health care provider. °  °Document Released: 10/30/2004 Document Revised: 02/10/2014 Document Reviewed: 08/26/2013 °Elsevier Interactive Patient Education ©2016 [employer] ° °

## 2015-01-25 ENCOUNTER — Other Ambulatory Visit (HOSPITAL_COMMUNITY): Payer: Self-pay | Admitting: Family Medicine

## 2015-01-25 DIAGNOSIS — I719 Aortic aneurysm of unspecified site, without rupture: Secondary | ICD-10-CM

## 2015-01-25 DIAGNOSIS — R1013 Epigastric pain: Secondary | ICD-10-CM

## 2015-01-25 DIAGNOSIS — Z1389 Encounter for screening for other disorder: Secondary | ICD-10-CM | POA: Diagnosis not present

## 2015-01-25 DIAGNOSIS — Z6839 Body mass index (BMI) 39.0-39.9, adult: Secondary | ICD-10-CM | POA: Diagnosis not present

## 2015-01-31 ENCOUNTER — Ambulatory Visit (HOSPITAL_COMMUNITY): Payer: Medicare Other

## 2015-02-01 ENCOUNTER — Ambulatory Visit (HOSPITAL_COMMUNITY)
Admission: RE | Admit: 2015-02-01 | Discharge: 2015-02-01 | Disposition: A | Discharge status: Home / Self Care | Payer: Medicare Other | Source: Ambulatory Visit | Attending: Family Medicine | Admitting: Family Medicine

## 2015-02-01 ENCOUNTER — Encounter (HOSPITAL_COMMUNITY): Payer: Self-pay

## 2015-02-01 DIAGNOSIS — I714 Abdominal aortic aneurysm, without rupture: Secondary | ICD-10-CM | POA: Insufficient documentation

## 2015-02-01 DIAGNOSIS — R1011 Right upper quadrant pain: Secondary | ICD-10-CM | POA: Insufficient documentation

## 2015-02-01 DIAGNOSIS — R1013 Epigastric pain: Secondary | ICD-10-CM | POA: Insufficient documentation

## 2015-02-01 DIAGNOSIS — R110 Nausea: Secondary | ICD-10-CM | POA: Insufficient documentation

## 2015-02-01 DIAGNOSIS — I719 Aortic aneurysm of unspecified site, without rupture: Secondary | ICD-10-CM

## 2015-02-01 MED ORDER — SODIUM CHLORIDE 0.9 % IJ SOLN
INTRAMUSCULAR | Status: AC
  Filled 2015-02-01: qty 200

## 2015-02-01 MED ORDER — SINCALIDE 5 MCG IJ SOLR
INTRAMUSCULAR | Status: AC
  Administered 2015-02-01: 2.47 ug via INTRAVENOUS
  Filled 2015-02-01: qty 5

## 2015-02-01 MED ORDER — STERILE WATER FOR INJECTION IJ SOLN
INTRAMUSCULAR | Status: AC
  Administered 2015-02-01: 2.47 mL via INTRAVENOUS
  Filled 2015-02-01: qty 10

## 2015-02-01 MED ORDER — TECHNETIUM TC 99M MEBROFENIN IV KIT
5.0000 | PACK | Freq: Once | INTRAVENOUS | Status: AC | PRN
  Administered 2015-02-01: 5.5 via INTRAVENOUS

## 2015-02-14 ENCOUNTER — Other Ambulatory Visit (HOSPITAL_COMMUNITY): Payer: Self-pay | Admitting: Endocrinology

## 2015-02-14 DIAGNOSIS — K635 Polyp of colon: Secondary | ICD-10-CM | POA: Diagnosis not present

## 2015-02-14 DIAGNOSIS — Z6837 Body mass index (BMI) 37.0-37.9, adult: Secondary | ICD-10-CM | POA: Diagnosis not present

## 2015-02-14 DIAGNOSIS — I10 Essential (primary) hypertension: Secondary | ICD-10-CM | POA: Diagnosis not present

## 2015-02-14 DIAGNOSIS — R1011 Right upper quadrant pain: Secondary | ICD-10-CM | POA: Diagnosis not present

## 2015-02-14 DIAGNOSIS — E042 Nontoxic multinodular goiter: Secondary | ICD-10-CM | POA: Diagnosis not present

## 2015-02-14 DIAGNOSIS — Z23 Encounter for immunization: Secondary | ICD-10-CM | POA: Diagnosis not present

## 2015-02-14 DIAGNOSIS — E784 Other hyperlipidemia: Secondary | ICD-10-CM | POA: Diagnosis not present

## 2015-02-15 DIAGNOSIS — K811 Chronic cholecystitis: Secondary | ICD-10-CM | POA: Diagnosis not present

## 2015-02-20 ENCOUNTER — Encounter (HOSPITAL_COMMUNITY): Payer: Medicare Other

## 2015-02-21 ENCOUNTER — Encounter (HOSPITAL_COMMUNITY): Payer: Medicare Other

## 2015-03-06 DIAGNOSIS — L409 Psoriasis, unspecified: Secondary | ICD-10-CM | POA: Diagnosis not present

## 2015-03-07 ENCOUNTER — Other Ambulatory Visit: Payer: Self-pay | Admitting: Neurosurgery

## 2015-03-07 DIAGNOSIS — M5116 Intervertebral disc disorders with radiculopathy, lumbar region: Secondary | ICD-10-CM | POA: Diagnosis not present

## 2015-03-12 ENCOUNTER — Ambulatory Visit
Admission: RE | Admit: 2015-03-12 | Discharge: 2015-03-12 | Disposition: A | Discharge status: Home / Self Care | Payer: Medicare Other | Source: Ambulatory Visit | Attending: Neurosurgery | Admitting: Neurosurgery

## 2015-03-12 DIAGNOSIS — M5416 Radiculopathy, lumbar region: Secondary | ICD-10-CM | POA: Diagnosis not present

## 2015-03-12 DIAGNOSIS — M5116 Intervertebral disc disorders with radiculopathy, lumbar region: Secondary | ICD-10-CM

## 2015-03-12 MED ORDER — METHYLPREDNISOLONE ACETATE 40 MG/ML INJ SUSP (RADIOLOG
120.0000 mg | Freq: Once | INTRAMUSCULAR | Status: AC
  Administered 2015-03-12: 120 mg via EPIDURAL

## 2015-03-12 MED ORDER — IOHEXOL 180 MG/ML  SOLN
1.0000 mL | Freq: Once | INTRAMUSCULAR | Status: AC | PRN
  Administered 2015-03-12: 1 mL via EPIDURAL

## 2015-03-12 NOTE — Discharge Instructions (Signed)

## 2015-03-20 DIAGNOSIS — J019 Acute sinusitis, unspecified: Secondary | ICD-10-CM | POA: Diagnosis not present

## 2015-03-20 DIAGNOSIS — Z1389 Encounter for screening for other disorder: Secondary | ICD-10-CM | POA: Diagnosis not present

## 2015-03-20 DIAGNOSIS — Z6839 Body mass index (BMI) 39.0-39.9, adult: Secondary | ICD-10-CM | POA: Diagnosis not present

## 2015-03-20 DIAGNOSIS — J301 Allergic rhinitis due to pollen: Secondary | ICD-10-CM | POA: Diagnosis not present

## 2015-03-27 DIAGNOSIS — Z6839 Body mass index (BMI) 39.0-39.9, adult: Secondary | ICD-10-CM | POA: Diagnosis not present

## 2015-03-27 DIAGNOSIS — Z1389 Encounter for screening for other disorder: Secondary | ICD-10-CM | POA: Diagnosis not present

## 2015-03-27 DIAGNOSIS — Z0000 Encounter for general adult medical examination without abnormal findings: Secondary | ICD-10-CM | POA: Diagnosis not present

## 2015-04-06 ENCOUNTER — Ambulatory Visit (HOSPITAL_COMMUNITY)
Admission: RE | Admit: 2015-04-06 | Discharge: 2015-04-06 | Disposition: A | Discharge status: Home / Self Care | Payer: Medicare Other | Source: Ambulatory Visit | Attending: Internal Medicine | Admitting: Internal Medicine

## 2015-04-06 ENCOUNTER — Encounter (HOSPITAL_COMMUNITY)
Admission: RE | Admit: 2015-04-06 | Discharge: 2015-04-06 | Disposition: A | Discharge status: Home / Self Care | Payer: Medicare Other | Source: Ambulatory Visit | Attending: Internal Medicine | Admitting: Internal Medicine

## 2015-04-06 ENCOUNTER — Other Ambulatory Visit (HOSPITAL_COMMUNITY): Payer: Self-pay | Admitting: Internal Medicine

## 2015-04-06 DIAGNOSIS — E782 Mixed hyperlipidemia: Secondary | ICD-10-CM | POA: Diagnosis not present

## 2015-04-06 DIAGNOSIS — K219 Gastro-esophageal reflux disease without esophagitis: Secondary | ICD-10-CM | POA: Diagnosis not present

## 2015-04-06 DIAGNOSIS — I517 Cardiomegaly: Secondary | ICD-10-CM | POA: Insufficient documentation

## 2015-04-06 DIAGNOSIS — I10 Essential (primary) hypertension: Secondary | ICD-10-CM | POA: Diagnosis not present

## 2015-04-06 DIAGNOSIS — R079 Chest pain, unspecified: Secondary | ICD-10-CM

## 2015-04-06 DIAGNOSIS — Z6838 Body mass index (BMI) 38.0-38.9, adult: Secondary | ICD-10-CM | POA: Diagnosis not present

## 2015-04-06 DIAGNOSIS — R091 Pleurisy: Secondary | ICD-10-CM | POA: Diagnosis not present

## 2015-04-06 DIAGNOSIS — Z1389 Encounter for screening for other disorder: Secondary | ICD-10-CM | POA: Diagnosis not present

## 2015-04-06 MED ORDER — SODIUM CHLORIDE 0.9 % IV SOLN
INTRAVENOUS | Status: DC
  Administered 2015-04-06: 10:00:00 via INTRAVENOUS

## 2015-04-06 MED ORDER — IOHEXOL 350 MG/ML SOLN
100.0000 mL | Freq: Once | INTRAVENOUS | Status: AC | PRN
  Administered 2015-04-06: 100 mL via INTRAVENOUS

## 2015-04-06 MED ORDER — DIPHENHYDRAMINE HCL 50 MG/ML IJ SOLN
50.0000 mg | Freq: Once | INTRAMUSCULAR | Status: AC
  Administered 2015-04-06: 50 mg via INTRAVENOUS

## 2015-04-06 MED ORDER — DIPHENHYDRAMINE HCL 50 MG/ML IJ SOLN
INTRAMUSCULAR | Status: AC
  Filled 2015-04-06: qty 1

## 2015-04-06 MED ORDER — HYDROCORTISONE NA SUCCINATE PF 250 MG IJ SOLR
200.0000 mg | Freq: Once | INTRAMUSCULAR | Status: AC
  Administered 2015-04-06: 200 mg via INTRAVENOUS
  Filled 2015-04-06: qty 200

## 2015-04-09 LAB — POCT I-STAT CREATININE: CREATININE: 1.3 mg/dL — AB (ref 0.61–1.24)

## 2015-04-12 DIAGNOSIS — M5136 Other intervertebral disc degeneration, lumbar region: Secondary | ICD-10-CM | POA: Diagnosis not present

## 2015-04-12 DIAGNOSIS — M488X6 Other specified spondylopathies, lumbar region: Secondary | ICD-10-CM | POA: Diagnosis not present

## 2015-04-12 DIAGNOSIS — M47816 Spondylosis without myelopathy or radiculopathy, lumbar region: Secondary | ICD-10-CM | POA: Diagnosis not present

## 2015-04-12 DIAGNOSIS — M7062 Trochanteric bursitis, left hip: Secondary | ICD-10-CM | POA: Diagnosis not present

## 2015-05-21 DIAGNOSIS — K219 Gastro-esophageal reflux disease without esophagitis: Secondary | ICD-10-CM | POA: Diagnosis not present

## 2015-05-21 DIAGNOSIS — G4733 Obstructive sleep apnea (adult) (pediatric): Secondary | ICD-10-CM | POA: Diagnosis not present

## 2015-05-21 DIAGNOSIS — Z6838 Body mass index (BMI) 38.0-38.9, adult: Secondary | ICD-10-CM | POA: Diagnosis not present

## 2015-05-21 DIAGNOSIS — E042 Nontoxic multinodular goiter: Secondary | ICD-10-CM | POA: Diagnosis not present

## 2015-05-21 DIAGNOSIS — E784 Other hyperlipidemia: Secondary | ICD-10-CM | POA: Diagnosis not present

## 2015-05-21 DIAGNOSIS — K635 Polyp of colon: Secondary | ICD-10-CM | POA: Diagnosis not present

## 2015-05-21 DIAGNOSIS — I10 Essential (primary) hypertension: Secondary | ICD-10-CM | POA: Diagnosis not present

## 2015-05-21 DIAGNOSIS — Z1389 Encounter for screening for other disorder: Secondary | ICD-10-CM | POA: Diagnosis not present

## 2015-05-22 ENCOUNTER — Other Ambulatory Visit: Payer: Self-pay | Admitting: Neurosurgery

## 2015-05-22 DIAGNOSIS — M7062 Trochanteric bursitis, left hip: Secondary | ICD-10-CM

## 2015-06-07 ENCOUNTER — Ambulatory Visit
Admission: RE | Admit: 2015-06-07 | Discharge: 2015-06-07 | Disposition: A | Discharge status: Home / Self Care | Payer: Medicare Other | Source: Ambulatory Visit | Attending: Neurosurgery | Admitting: Neurosurgery

## 2015-06-07 DIAGNOSIS — M7062 Trochanteric bursitis, left hip: Secondary | ICD-10-CM

## 2015-06-07 MED ORDER — IOPAMIDOL (ISOVUE-M 200) INJECTION 41%
1.0000 mL | Freq: Once | INTRAMUSCULAR | Status: AC
  Administered 2015-06-07: 1 mL

## 2015-06-07 MED ORDER — METHYLPREDNISOLONE ACETATE 40 MG/ML INJ SUSP (RADIOLOG
120.0000 mg | Freq: Once | INTRAMUSCULAR | Status: AC
  Administered 2015-06-07: 120 mg via INTRALESIONAL

## 2015-06-18 DIAGNOSIS — Z6839 Body mass index (BMI) 39.0-39.9, adult: Secondary | ICD-10-CM | POA: Diagnosis not present

## 2015-06-18 DIAGNOSIS — Z1389 Encounter for screening for other disorder: Secondary | ICD-10-CM | POA: Diagnosis not present

## 2015-06-18 DIAGNOSIS — B999 Unspecified infectious disease: Secondary | ICD-10-CM | POA: Diagnosis not present

## 2015-07-12 DIAGNOSIS — M47816 Spondylosis without myelopathy or radiculopathy, lumbar region: Secondary | ICD-10-CM | POA: Diagnosis not present

## 2015-07-12 DIAGNOSIS — M5136 Other intervertebral disc degeneration, lumbar region: Secondary | ICD-10-CM | POA: Diagnosis not present

## 2015-07-13 ENCOUNTER — Other Ambulatory Visit: Payer: Self-pay | Admitting: Neurosurgery

## 2015-07-13 DIAGNOSIS — M47816 Spondylosis without myelopathy or radiculopathy, lumbar region: Secondary | ICD-10-CM

## 2015-07-21 ENCOUNTER — Ambulatory Visit
Admission: RE | Admit: 2015-07-21 | Discharge: 2015-07-21 | Disposition: A | Discharge status: Home / Self Care | Payer: Medicare Other | Source: Ambulatory Visit | Attending: Neurosurgery | Admitting: Neurosurgery

## 2015-07-21 DIAGNOSIS — M47816 Spondylosis without myelopathy or radiculopathy, lumbar region: Secondary | ICD-10-CM

## 2015-07-21 DIAGNOSIS — M5126 Other intervertebral disc displacement, lumbar region: Secondary | ICD-10-CM | POA: Diagnosis not present

## 2015-08-09 ENCOUNTER — Other Ambulatory Visit (HOSPITAL_COMMUNITY): Payer: Self-pay | Admitting: Internal Medicine

## 2015-08-09 ENCOUNTER — Ambulatory Visit (HOSPITAL_COMMUNITY)
Admission: RE | Admit: 2015-08-09 | Discharge: 2015-08-09 | Disposition: A | Discharge status: Home / Self Care | Payer: Medicare Other | Source: Ambulatory Visit | Attending: Internal Medicine | Admitting: Internal Medicine

## 2015-08-09 DIAGNOSIS — R109 Unspecified abdominal pain: Secondary | ICD-10-CM

## 2015-08-09 DIAGNOSIS — Z125 Encounter for screening for malignant neoplasm of prostate: Secondary | ICD-10-CM | POA: Diagnosis not present

## 2015-08-09 DIAGNOSIS — K219 Gastro-esophageal reflux disease without esophagitis: Secondary | ICD-10-CM | POA: Diagnosis not present

## 2015-08-09 DIAGNOSIS — K5900 Constipation, unspecified: Secondary | ICD-10-CM | POA: Diagnosis not present

## 2015-08-09 DIAGNOSIS — Z1389 Encounter for screening for other disorder: Secondary | ICD-10-CM | POA: Diagnosis not present

## 2015-08-09 DIAGNOSIS — R1013 Epigastric pain: Secondary | ICD-10-CM | POA: Diagnosis not present

## 2015-08-09 DIAGNOSIS — E782 Mixed hyperlipidemia: Secondary | ICD-10-CM | POA: Diagnosis not present

## 2015-08-09 DIAGNOSIS — R5383 Other fatigue: Secondary | ICD-10-CM | POA: Diagnosis not present

## 2015-08-09 DIAGNOSIS — Z6839 Body mass index (BMI) 39.0-39.9, adult: Secondary | ICD-10-CM | POA: Diagnosis not present

## 2015-08-10 DIAGNOSIS — R945 Abnormal results of liver function studies: Secondary | ICD-10-CM | POA: Diagnosis not present

## 2015-08-14 DIAGNOSIS — E042 Nontoxic multinodular goiter: Secondary | ICD-10-CM | POA: Diagnosis not present

## 2015-08-14 DIAGNOSIS — Z6837 Body mass index (BMI) 37.0-37.9, adult: Secondary | ICD-10-CM | POA: Diagnosis not present

## 2015-08-14 DIAGNOSIS — K635 Polyp of colon: Secondary | ICD-10-CM | POA: Diagnosis not present

## 2015-08-14 DIAGNOSIS — I10 Essential (primary) hypertension: Secondary | ICD-10-CM | POA: Diagnosis not present

## 2015-08-14 DIAGNOSIS — E784 Other hyperlipidemia: Secondary | ICD-10-CM | POA: Diagnosis not present

## 2015-08-14 DIAGNOSIS — K219 Gastro-esophageal reflux disease without esophagitis: Secondary | ICD-10-CM | POA: Diagnosis not present

## 2015-08-14 DIAGNOSIS — G4733 Obstructive sleep apnea (adult) (pediatric): Secondary | ICD-10-CM | POA: Diagnosis not present

## 2015-08-17 ENCOUNTER — Other Ambulatory Visit (HOSPITAL_COMMUNITY): Payer: Self-pay | Admitting: Internal Medicine

## 2015-08-17 DIAGNOSIS — R945 Abnormal results of liver function studies: Secondary | ICD-10-CM

## 2015-08-22 ENCOUNTER — Ambulatory Visit (HOSPITAL_COMMUNITY)
Admission: RE | Admit: 2015-08-22 | Discharge: 2015-08-22 | Disposition: A | Discharge status: Home / Self Care | Payer: Medicare Other | Source: Ambulatory Visit | Attending: Internal Medicine | Admitting: Internal Medicine

## 2015-08-22 DIAGNOSIS — R7989 Other specified abnormal findings of blood chemistry: Secondary | ICD-10-CM | POA: Diagnosis not present

## 2015-08-22 DIAGNOSIS — R932 Abnormal findings on diagnostic imaging of liver and biliary tract: Secondary | ICD-10-CM | POA: Insufficient documentation

## 2015-08-22 DIAGNOSIS — R93421 Abnormal radiologic findings on diagnostic imaging of right kidney: Secondary | ICD-10-CM | POA: Insufficient documentation

## 2015-08-22 DIAGNOSIS — R945 Abnormal results of liver function studies: Secondary | ICD-10-CM | POA: Diagnosis not present

## 2015-08-22 DIAGNOSIS — R93422 Abnormal radiologic findings on diagnostic imaging of left kidney: Secondary | ICD-10-CM | POA: Insufficient documentation

## 2015-08-28 DIAGNOSIS — M488X6 Other specified spondylopathies, lumbar region: Secondary | ICD-10-CM | POA: Diagnosis not present

## 2015-08-29 ENCOUNTER — Other Ambulatory Visit: Payer: Self-pay | Admitting: Neurosurgery

## 2015-08-29 DIAGNOSIS — M488X6 Other specified spondylopathies, lumbar region: Secondary | ICD-10-CM

## 2015-08-30 DIAGNOSIS — G4733 Obstructive sleep apnea (adult) (pediatric): Secondary | ICD-10-CM | POA: Diagnosis not present

## 2015-08-30 DIAGNOSIS — Z6839 Body mass index (BMI) 39.0-39.9, adult: Secondary | ICD-10-CM | POA: Diagnosis not present

## 2015-08-30 DIAGNOSIS — Z1389 Encounter for screening for other disorder: Secondary | ICD-10-CM | POA: Diagnosis not present

## 2015-08-30 DIAGNOSIS — S00401A Unspecified superficial injury of right ear, initial encounter: Secondary | ICD-10-CM | POA: Diagnosis not present

## 2015-09-05 ENCOUNTER — Telehealth: Payer: Self-pay | Admitting: Gastroenterology

## 2015-09-05 DIAGNOSIS — L409 Psoriasis, unspecified: Secondary | ICD-10-CM | POA: Diagnosis not present

## 2015-09-05 DIAGNOSIS — Z79899 Other long term (current) drug therapy: Secondary | ICD-10-CM | POA: Diagnosis not present

## 2015-09-05 NOTE — Telephone Encounter (Signed)
Left message for patient to call back  

## 2015-09-05 NOTE — Telephone Encounter (Signed)
Patient reports RUQ pain.  He also had lab work with his primary care and had elevated LFTs.Marland Kitchen  He will bring a copy of this to appt Friday with Ellouise Newer, PA At 1:15

## 2015-09-06 ENCOUNTER — Ambulatory Visit
Admission: RE | Admit: 2015-09-06 | Discharge: 2015-09-06 | Disposition: A | Discharge status: Home / Self Care | Payer: Medicare Other | Source: Ambulatory Visit | Attending: Neurosurgery | Admitting: Neurosurgery

## 2015-09-06 VITALS — BP 165/62 | HR 54

## 2015-09-06 DIAGNOSIS — M488X6 Other specified spondylopathies, lumbar region: Secondary | ICD-10-CM

## 2015-09-06 DIAGNOSIS — M545 Low back pain: Secondary | ICD-10-CM | POA: Diagnosis not present

## 2015-09-06 DIAGNOSIS — M4726 Other spondylosis with radiculopathy, lumbar region: Secondary | ICD-10-CM

## 2015-09-06 MED ORDER — METHYLPREDNISOLONE ACETATE 40 MG/ML INJ SUSP (RADIOLOG
120.0000 mg | Freq: Once | INTRAMUSCULAR | Status: AC
  Administered 2015-09-06: 120 mg via EPIDURAL

## 2015-09-06 MED ORDER — IOPAMIDOL (ISOVUE-M 200) INJECTION 41%
1.0000 mL | Freq: Once | INTRAMUSCULAR | Status: AC
  Administered 2015-09-06: 1 mL via EPIDURAL

## 2015-09-06 NOTE — Discharge Instructions (Signed)

## 2015-09-07 ENCOUNTER — Ambulatory Visit (INDEPENDENT_AMBULATORY_CARE_PROVIDER_SITE_OTHER): Payer: Medicare Other | Admitting: Physician Assistant

## 2015-09-07 ENCOUNTER — Encounter: Payer: Self-pay | Admitting: Physician Assistant

## 2015-09-07 VITALS — BP 120/70 | HR 66 | Ht 71.0 in | Wt 272.0 lb

## 2015-09-07 DIAGNOSIS — K760 Fatty (change of) liver, not elsewhere classified: Secondary | ICD-10-CM

## 2015-09-07 DIAGNOSIS — R140 Abdominal distension (gaseous): Secondary | ICD-10-CM | POA: Diagnosis not present

## 2015-09-07 DIAGNOSIS — Z86010 Personal history of colonic polyps: Secondary | ICD-10-CM

## 2015-09-07 DIAGNOSIS — R945 Abnormal results of liver function studies: Secondary | ICD-10-CM

## 2015-09-07 DIAGNOSIS — R1011 Right upper quadrant pain: Secondary | ICD-10-CM

## 2015-09-07 DIAGNOSIS — R110 Nausea: Secondary | ICD-10-CM | POA: Diagnosis not present

## 2015-09-07 DIAGNOSIS — R7989 Other specified abnormal findings of blood chemistry: Secondary | ICD-10-CM

## 2015-09-07 NOTE — Patient Instructions (Addendum)
You will be due for a recall colonoscopy in 10/2016. We will send you a reminder in the mail when it gets closer to that time.  Please follow up with Dr Fuller Plan as needed.  Please follow up with the surgeon you previously saw: [facility] [address] [place], [place] [address]  Phone [phone]   If you are age 69 or older, your body mass index should be between 23-30. Your Body mass index is 37.94 kg/m. If this is out of the aforementioned range listed, please consider follow up with your Primary Care Provider.  If you are age 7 or younger, your body mass index should be between 19-25. Your Body mass index is 37.94 kg/m. If this is out of the aformentioned range listed, please consider follow up with your Primary Care Provider.

## 2015-09-07 NOTE — Progress Notes (Signed)
Chief Complaint: RUQ pain, Nausea, Diarrhea  HPI:  [name] is a 70 year old Caucasian male, with a past medical history significant for reflux, IBS, fatty liver disease and adenomatous polyps, who presents to clinic today with a complaint of right upper quadrant abdominal pain for 2 weeks, diarrhea, nausea and recent abnormal labs showing elevated liver enzymes. The patient has followed with Dr. Fuller Plan in the past for screening colonoscopies. Recent labs drawn on 08/09/15 show a normal CBC and a CMP with minimally elevated AST at 54 and ALT of 49. Previous labs drawn 08/10/2015 showing negative hepatitis panel.  Review of patient's chart shows his last visit on 07/22/13 with Tye Savoy, NP for a change in bowel habits and previous to this a complaint of some right upper quadrant pain in 2013. Apparently patient has been to see his primary care physician regarding right upper quadrant pain in the past as well. He did have an ultrasound completed 07/19/13 which showed fatty liver and was otherwise normal. He also had further evaluation of his gallbladder on 02/01/15 with a HIDA scan which was abnormal. Ejection fraction was only 26% at that time. The patient did have recent repeat ultrasound which continued to show fatty liver and was otherwise normal.  Today, the patient tells me that his gallbladder has been evaluated before. He does recall that imaging study in December and tells me that he also followed with a surgeon after this study was abnormal. At that time it was decided that he was not having any symptoms and so they would not pursue a cholecystectomy. The patient currently describes that for the past 2 weeks he has been experiencing a right upper quadrant abdominal pain which last week was rated a 7-8/10 for one whole day. Only oxycodone could relieve this so that he could finally go to sleep. Patient tells me that he constantly feels like "something is under the right side of my ribs". This has  been associated with an increase in his regular gas and bloating as well as nausea and belching. Patient also tells me that 2 weeks ago he started with diarrhea every day accompanied by a lot of gas and as of yesterday he has returned to a semisolid stool and had a good solid bowel movement this morning.  Patient also has a known history of gastritis last evaluated via EGD in 2005 with a finding of gastritis, now controlled on pantoprazole 40 mg twice a day. The patient denies any symptoms of heartburn or reflux at this time.  Patient also has a known history of adenomatous polyps. His last colonoscopy was on 10/07/2011. Independent review of report and images from this study shows diverticulosis and polyps. Pathology showed hyperplastic polyps. Previous to this patient did have adenomatous polyps. He was told to repeat colonoscopy in 5 years.  Patient's social history is positive for having an occasional bourbon drink. He does question today whether this could've caused the elevation in his liver enzymes.  Currently patient denies fever, chills, blood in the stool, melena, weight loss, fatigue, anorexia, vomiting, dysphagia, recent change in medications or diet.    Past Medical History:  Diagnosis Date  . Anxiety   . Back pain, chronic   . Colon polyp   . GERD (gastroesophageal reflux disease)   . Heart palpitations   . High cholesterol   . Hypertension   . Irritable bowel syndrome   . Osteoporosis   . Psoriasis     Past Surgical History:  Procedure Laterality  Date  . BACK SURGERY  2015  . FOOT FRACTURE SURGERY     left foot  . KNEE ARTHROSCOPY     left    Current Outpatient Prescriptions  Medication Sig Dispense Refill  . ALPRAZolam (XANAX) 0.5 MG tablet Take 0.25-0.5 mg by mouth at bedtime as needed for sleep.     Marland Kitchen aspirin EC 81 MG tablet Take 81 mg by mouth daily.    . betamethasone dipropionate (DIPROLENE) 0.05 % cream     . ciprofloxacin (CIPRO) 500 MG tablet Take 500  mg by mouth 2 (two) times daily. 10 day course    . clotrimazole-betamethasone (LOTRISONE) cream Apply 1 application topically daily as needed (FOR IRRITATION).    Marland Kitchen dicyclomine (BENTYL) 10 MG capsule TK ONE C PO  QID  11  . doxazosin (CARDURA) 4 MG tablet Take 4 mg by mouth at bedtime.      . folic acid (FOLVITE) 1 MG tablet Take 1 mg by mouth every evening.     Marland Kitchen lisinopril (PRINIVIL,ZESTRIL) 10 MG tablet Take 10 mg by mouth every evening.     . methotrexate (RHEUMATREX) 2.5 MG tablet Take 7.5 mg by mouth once a week. Take 3 tab weekly Caution:Chemotherapy. Protect from light.    . metoprolol tartrate (LOPRESSOR) 25 MG tablet Take 1 tablet (25 mg total) by mouth 2 (two) times daily. 60 tablet 0  . oxyCODONE-acetaminophen (PERCOCET/ROXICET) 5-325 MG tablet Take 1-2 tablets by mouth every 4 (four) hours as needed for moderate pain or severe pain.    . pantoprazole (PROTONIX) 40 MG tablet Take 1 tablet (40 mg total) by mouth 2 (two) times daily. (Patient taking differently: Take 40 mg by mouth every morning. ) 60 tablet 11  . pravastatin (PRAVACHOL) 20 MG tablet Take 20 mg by mouth every evening.     . Probiotic Product (PROBIOTIC PO) Take 1 capsule by mouth daily.     No current facility-administered medications for this visit.     Allergies as of 09/07/2015 - Review Complete 09/07/2015  Allergen Reaction Noted  . Cephalexin Other (See Comments) 03/07/2008  . Contrast media [iodinated diagnostic agents] Hives and Rash 09/17/2011    Family History  Problem Relation Age of Onset  . Arrhythmia Mother     Atrial fib  . Arrhythmia Sister     Atrial fib  . Stroke Sister   . Cancer Father     Bladder, Deceased   . Cancer Brother   . Colon cancer Neg Hx     Social History   Social History  . Marital status: Married    Spouse name: N/A  . Number of children: 2  . Years of education: N/A   Occupational History  .      Retired   Social History Main Topics  . Smoking status:  Former Smoker    Packs/day: 2.00    Types: Cigarettes    Start date: 05/22/1962    Quit date: 05/21/1973  . Smokeless tobacco: Never Used     Comment: Quit 25 yrs now   . Alcohol use 8.4 oz/week    14 Shots of liquor per week     Comment: Rare   . Drug use: No  . Sexual activity: Yes   Other Topics Concern  . Not on file   Social History Narrative   Has dog named [place]    Review of Systems:    Constitutional: No weight loss, fever, chills, weakness or fatigue HEENT: Eyes: No  Change in vision               Ears, Nose, Throat:  No change in hearing Skin: No rash or itching Cardiovascular: No chest pain, chest pressure or palpitations   Respiratory: No SOB or cough Gastrointestinal: See HPI and otherwise negative Genitourinary: No dysuria or change in urinary frequency Neurological: No headache, dizziness or syncope Musculoskeletal: No new muscle or back pain Hematologic: No bleeding or bruising Psychiatric: No history of depression or anxiety   Physical Exam:  Vital signs: BP 120/70 (BP Location: Left Arm, Patient Position: Sitting, Cuff Size: Normal)   Pulse 66   Ht 5\' 11"  (1.803 m)   Wt 272 lb (123.4 kg)   BMI 37.94 kg/m   General:   Pleasant Overweight Caucasian male appears to be in NAD, Well developed, Well nourished, alert and cooperative Head:  Normocephalic and atraumatic. Eyes:   PEERL, EOMI. No icterus. Conjunctiva pink. Ears:  Normal auditory acuity. Neck:  Supple Throat: Oral cavity and pharynx without inflammation, swelling or lesion. Teeth in good condition. Lungs: Respirations even and unlabored. Lungs clear to auscultation bilaterally.   No wheezes, crackles, or rhonchi.  Heart: Normal S1, S2. No MRG. Regular rate and rhythm. No peripheral edema, cyanosis or pallor.  Abdomen:  Soft, nondistended, mild TTP to deep palpation in the right upper quadrant. No rebound or guarding. Normal bowel sounds. No appreciable masses or hepatomegaly. Rectal:  Not  performed.  Msk:  Symmetrical without gross deformities. Peripheral pulses intact.  Extremities:  Without edema, no deformity or joint abnormality.  Neurologic:  Alert and  oriented x4;  grossly normal neurologically.  Skin:   Dry and intact without significant lesions or rashes. Psychiatric: Oriented to person, place and time. Demonstrates good judgement and reason without abnormal affect or behaviors.  RELEVANT LABS AND IMAGING: See history of present illness  Assessment: 1. Right upper quadrant pain: Intermittently over the past few years, consistently over the past 2 weeks associated with nausea and diarrhea, recent ultrasound normal other than chronic fatty liver, HIDA scan in December 2016 was abnormal with a decreased ejection fraction of 26%, patient/surgeon at that time was not experiencing symptoms and cholecystectomy was postponed-at this time gallbladder dysfunction is most likely the cause of his symptoms. 2. Elevated liver enzymes: Minimally elevated AST and ALT on recent labs, either related to fatty liver disease or statin use, less likely hepatitis or other liver etiology 3. Fatty liver disease: Known from ultrasound 07/19/13, no change to recent ultrasound 4. Nausea: Related to right upper quadrant abdominal pain 5. History of adenomatous polyps: Last colon 10/07/2011, repeat recommended in 5 years, already scheduled  Plan: 1. Patient with abnormal HIDA scan in December of last year, did see a Psychologist, sport and exercise regarding this already, but was not having symptoms at that time. Currently expressing symptoms of right upper quadrant pain, nausea and a change in bowel habits towards diarrhea over the past 2 weeks. Patient does seem to have intermittent right upper quadrant pain, ever since December of last year and even previous to this. At this time recommend referral back to the surgeon he saw in [place] regarding this. If he cannot get appt there we can send referral to [place] [facility]  here in town. 2. Discussed patient's elevated liver enzymes with him today. They're only minimally elevated and I agree with plans for primary care provider to follow these in the next 6 months. If continually elevated could consider further evaluation, though likely this is either  due to patient's statin and/or fatty liver disease. 3. Recommended slow and steady weight level loss of at least 1-2 pounds per week 4. Recommend the patient avoid high fat meals until he has seen surgeon. 5. Patient is already scheduled for a screening colonoscopy in September of next year with Dr. Fuller Plan for his history of adenomatous polyps 6. Continue pantoprazole 40 mg twice daily. 7. Patient to follow in clinic as needed in the future.     Ellouise Newer, PA-C [facility] 09/07/2015, 1:50 PM  Cc: Elsie Lincoln, MD

## 2015-09-07 NOTE — Progress Notes (Signed)
Reviewed and agree with initial management plan.  Ayani Ospina T. Poppi Scantling, MD FACG 

## 2015-09-10 ENCOUNTER — Telehealth: Payer: Self-pay | Admitting: Physician Assistant

## 2015-09-10 DIAGNOSIS — N411 Chronic prostatitis: Secondary | ICD-10-CM | POA: Diagnosis not present

## 2015-09-10 DIAGNOSIS — N4340 Spermatocele of epididymis, unspecified: Secondary | ICD-10-CM | POA: Diagnosis not present

## 2015-09-10 NOTE — Telephone Encounter (Signed)
Practice requires that I send referral on their form.  Form and records faxed.  I left a message for the patient that I will call him with the appt details as soon as they let me know.

## 2015-09-11 NOTE — Telephone Encounter (Signed)
Patient notified of appt for 09/26/1709:00 with Dr. Arnoldo Morale

## 2015-09-27 DIAGNOSIS — K811 Chronic cholecystitis: Secondary | ICD-10-CM | POA: Diagnosis not present

## 2015-09-27 NOTE — H&P (Signed)
  NTS SOAP Note  Vital Signs:  Vitals as of: [id number]: Systolic 123XX123: Diastolic 72: Heart Rate 57: Temp 98.54F (Temporal): Height 36ft 11in: Weight 269Lbs 0 Ounces: Pain Level 4: BMI 37.52   BMI : 37.52 kg/m2  Subjective: This 70 year old male presents for of biliary colic.  I last saw him in my office 11/17 for low gallbladder ejection fraction.  Was not too symptomatic at that time.  Since then, he has developed more right upper quadrant abdominal pain with radiation to the right flank, nausea, and bloating.  Various foods bring on attacks, sometimes occur spontaneously.  No fever, chills, jaundice. Resolve spontaneously.  Has had some elevation of LFT's in the past.  Currently feels ok.  Review of Symptoms:  Constitutional:negative Head:negative Eyes:negative Nose/Mouth/Throat:negative Cardiovascular:negative Respiratory:negative Gastrointestinabdominal pain, nausea, heartburn, dyspepsia Genitourinary:negative joint and neck pain rash Hematolgic/Lymphatic:negative Allergic/Immunologic:negative   Past Medical History:Reviewed  Past Medical History  Surgical History: knee surgery, back surgery, hernia surgery Medical Problems: anxiety, back pain, GER, high cholesterol, IBS Allergies: keflex, contrast Medications: baby asa, xanax, bentyl, cardura, prinivil, lopressor, percocet, protonix, pravachol   Social History:Reviewed  Social History  Preferred Language: English Race:  White Ethnicity: Not Hispanic / Latino Age: 64 year Marital Status:  S Alcohol: no   Smoking Status: Never smoker reviewed on 09/27/2015 Functional Status reviewed on 09/27/2015 ------------------------------------------------ Bathing: Normal Cooking: Normal Dressing: Normal Driving: Normal Eating: Normal Managing Meds: Normal Oral Care: Normal Shopping: Normal Toileting: Normal Transferring: Normal Walking: Normal Cognitive Status reviewed on  09/27/2015 ------------------------------------------------ Attention: Normal Decision Making: Normal Language: Normal Memory: Normal Motor: Normal Perception: Normal Problem Solving: Normal Visual and Spatial: Normal   Family History:Reviewed  Family Health History Mother  Father, Deceased; Urinary bladder cancer;     Objective Information: General:Well appearing, well nourished in no distress. Head:Atraumatic; no masses; no abnormalities Neck:Supple without lymphadenopathy.  Heart:RRR, no murmur Lungs:CTA bilaterally, no wheezes, rhonchi, rales.  Breathing unlabored. Abdomen:Soft, NT/ND, normal bowel sounds, no HSM, no masses.  No peritoneal signs. U/S report reviewed.  No cholelithiasis, fatty liver Primary care note reviewed. Assessment:Chronic cholecystitis, elevated liver enzyme tests  Diagnoses: 575.11  K81.1 Chronic cholecystitis without calculus (Chronic cholecystitis)  Procedures: [id number] - OFFICE OUTPATIENT VISIT 25 MINUTES    Plan:  Scheduled for laparoscopic cholecystectomy, liver biosy on 10/12/15.   Patient Education:Alternative treatments to surgery were discussed with patient (and family).Risks and benefits  of procedure including bleeding, infection, hepatobiliary injury, and the possibility of an open procedure were fully explained to the patient (and family) who gave informed consent. Patient/family questions were addressed.  Follow-up:Pending Surgery

## 2015-10-03 DIAGNOSIS — M47816 Spondylosis without myelopathy or radiculopathy, lumbar region: Secondary | ICD-10-CM | POA: Diagnosis not present

## 2015-10-03 DIAGNOSIS — M5136 Other intervertebral disc degeneration, lumbar region: Secondary | ICD-10-CM | POA: Diagnosis not present

## 2015-10-05 ENCOUNTER — Other Ambulatory Visit: Payer: Self-pay | Admitting: Neurosurgery

## 2015-10-05 DIAGNOSIS — M47816 Spondylosis without myelopathy or radiculopathy, lumbar region: Secondary | ICD-10-CM

## 2015-10-09 ENCOUNTER — Encounter (HOSPITAL_COMMUNITY): Payer: Self-pay

## 2015-10-09 ENCOUNTER — Encounter (HOSPITAL_COMMUNITY)
Admission: RE | Admit: 2015-10-09 | Discharge: 2015-10-09 | Disposition: A | Discharge status: Home / Self Care | Payer: Medicare Other | Source: Ambulatory Visit | Attending: General Surgery | Admitting: General Surgery

## 2015-10-09 ENCOUNTER — Other Ambulatory Visit: Payer: Self-pay

## 2015-10-09 DIAGNOSIS — K219 Gastro-esophageal reflux disease without esophagitis: Secondary | ICD-10-CM | POA: Diagnosis not present

## 2015-10-09 DIAGNOSIS — I10 Essential (primary) hypertension: Secondary | ICD-10-CM | POA: Diagnosis not present

## 2015-10-09 DIAGNOSIS — Z6837 Body mass index (BMI) 37.0-37.9, adult: Secondary | ICD-10-CM | POA: Diagnosis not present

## 2015-10-09 DIAGNOSIS — G4730 Sleep apnea, unspecified: Secondary | ICD-10-CM | POA: Diagnosis not present

## 2015-10-09 DIAGNOSIS — Z79899 Other long term (current) drug therapy: Secondary | ICD-10-CM | POA: Diagnosis not present

## 2015-10-09 DIAGNOSIS — K811 Chronic cholecystitis: Secondary | ICD-10-CM | POA: Diagnosis not present

## 2015-10-09 DIAGNOSIS — E7800 Pure hypercholesterolemia, unspecified: Secondary | ICD-10-CM | POA: Diagnosis not present

## 2015-10-09 DIAGNOSIS — Z87891 Personal history of nicotine dependence: Secondary | ICD-10-CM | POA: Diagnosis not present

## 2015-10-09 DIAGNOSIS — Z7982 Long term (current) use of aspirin: Secondary | ICD-10-CM | POA: Diagnosis not present

## 2015-10-09 DIAGNOSIS — F419 Anxiety disorder, unspecified: Secondary | ICD-10-CM | POA: Diagnosis not present

## 2015-10-09 LAB — BASIC METABOLIC PANEL
Anion gap: 10 (ref 5–15)
BUN: 14 mg/dL (ref 6–20)
CHLORIDE: 102 mmol/L (ref 101–111)
CO2: 24 mmol/L (ref 22–32)
CREATININE: 1.09 mg/dL (ref 0.61–1.24)
Calcium: 9.4 mg/dL (ref 8.9–10.3)
GFR calc non Af Amer: >60 mL/min (ref >60)
Glucose, Bld: 94 mg/dL (ref 65–99)
POTASSIUM: 3.8 mmol/L (ref 3.5–5.1)
Sodium: 136 mmol/L (ref 135–145)

## 2015-10-09 LAB — CBC WITH DIFFERENTIAL/PLATELET
BASOS ABS: 0.1 10*3/uL (ref 0.0–0.1)
Basophils Relative: 1 %
EOS ABS: 0.3 10*3/uL (ref 0.0–0.7)
EOS PCT: 3 %
HCT: 41 % (ref 39.0–52.0)
Hemoglobin: 14.2 g/dL (ref 13.0–17.0)
Lymphocytes Relative: 26 %
Lymphs Abs: 2.2 10*3/uL (ref 0.7–4.0)
MCH: 32.9 pg (ref 26.0–34.0)
MCHC: 34.6 g/dL (ref 30.0–36.0)
MCV: 95.1 fL (ref 78.0–100.0)
MONO ABS: 0.8 10*3/uL (ref 0.1–1.0)
Monocytes Relative: 9 %
NEUTROS PCT: 61 %
Neutro Abs: 5.2 10*3/uL (ref 1.7–7.7)
PLATELETS: 268 10*3/uL (ref 150–400)
RBC: 4.31 MIL/uL (ref 4.22–5.81)
RDW: 13.4 % (ref 11.5–15.5)
WBC: 8.6 10*3/uL (ref 4.0–10.5)

## 2015-10-09 LAB — HEPATIC FUNCTION PANEL
ALBUMIN: 4.3 g/dL (ref 3.5–5.0)
ALK PHOS: 65 U/L (ref 38–126)
ALT: 19 U/L (ref 17–63)
AST: 16 U/L (ref 15–41)
BILIRUBIN TOTAL: 0.8 mg/dL (ref 0.3–1.2)
Bilirubin, Direct: 0.2 mg/dL (ref 0.1–0.5)
Indirect Bilirubin: 0.6 mg/dL (ref 0.3–0.9)
TOTAL PROTEIN: 7.5 g/dL (ref 6.5–8.1)

## 2015-10-09 NOTE — Patient Instructions (Signed)
Your procedure is scheduled on: 10/12/2015  Report to [facility] at  6:45   AM.  Call this number if you have problems the morning of surgery: [phone]   Remember:   Do not drink or eat food:After Midnight.  :  Take these medicines the morning of surgery with A SIP OF WATER: Metoprolol and protonix   Do not wear jewelry, make-up or nail polish.  Do not wear lotions, powders, or perfumes. You may wear deodorant.  Do not shave 48 hours prior to surgery. Men may shave face and neck.  Do not bring valuables to the hospital.  Contacts, dentures or bridgework may not be worn into surgery.  Leave suitcase in the car. After surgery it may be brought to your room.  For patients admitted to the hospital, checkout time is 11:00 AM the day of discharge.   Patients discharged the day of surgery will not be allowed to drive home.    Special Instructions: Shower using CHG night before surgery and shower the day of surgery use CHG.  Use special wash - you have one bottle of CHG for all showers.  You should use approximately 1/2 of the bottle for each shower.  Laparoscopic Cholecystectomy, Care After Refer to this sheet in the next few weeks. These instructions provide you with information about caring for yourself after your procedure. Your health care provider may also give you more specific instructions. Your treatment has been planned according to current medical practices, but problems sometimes occur. Call your health care provider if you have any problems or questions after your procedure. WHAT TO EXPECT AFTER THE PROCEDURE After your procedure, it is common to have:  Pain at your incision sites. You will be given pain medicines to control your pain.  Mild nausea or vomiting. This should improve after the first 24 hours.  Bloating and possible shoulder pain from the gas that was used during the procedure. This will improve after the first 24 hours. HOME CARE INSTRUCTIONS Incision  Care  Follow instructions from your health care provider about how to take care of your incisions. Make sure you:  Wash your hands with soap and water before you change your bandage (dressing). If soap and water are not available, use hand sanitizer.  Change your dressing as told by your health care provider.  Leave stitches (sutures), skin glue, or adhesive strips in place. These skin closures may need to be in place for 2 weeks or longer. If adhesive strip edges start to loosen and curl up, you may trim the loose edges. Do not remove adhesive strips completely unless your health care provider tells you to do that.  Do not take baths, swim, or use a hot tub until your health care provider approves. Ask your health care provider if you can take showers. You may only be allowed to take sponge baths for bathing. General Instructions  Take over-the-counter and prescription medicines only as told by your health care provider.  Do not drive or operate heavy machinery while taking prescription pain medicine.  Return to your normal diet as told by your health care provider.  Do not lift anything that is heavier than 10 lb (4.5 kg).  Do not play contact sports for one week or until your health care provider approves. SEEK MEDICAL CARE IF:   You have redness, swelling, or pain at the site of your incision.  You have fluid, blood, or pus coming from your incision.  You notice a  bad smell coming from your incision area.  Your surgical incisions break open.  You have a fever. SEEK IMMEDIATE MEDICAL CARE IF:  You develop a rash.  You have difficulty breathing.  You have chest pain.  You have increasing pain in your shoulders (shoulder strap areas).  You faint or have dizzy episodes while you are standing.  You have severe pain in your abdomen.  You have nausea or vomiting that lasts for more than one day.   This information is not intended to replace advice given to you by your  health care provider. Make sure you discuss any questions you have with your health care provider.   Document Released: 01/20/2005 Document Revised: 10/11/2014 Document Reviewed: 09/01/2012 Elsevier Interactive Patient Education 2016 [place] Anesthesia, Adult, Care After Refer to this sheet in the next few weeks. These instructions provide you with information on caring for yourself after your procedure. Your health care provider may also give you more specific instructions. Your treatment has been planned according to current medical practices, but problems sometimes occur. Call your health care provider if you have any problems or questions after your procedure. WHAT TO EXPECT AFTER THE PROCEDURE After the procedure, it is typical to experience:  Sleepiness.  Nausea and vomiting. HOME CARE INSTRUCTIONS  For the first 24 hours after general anesthesia:  Have a responsible person with you.  Do not drive a car. If you are alone, do not take public transportation.  Do not drink alcohol.  Do not take medicine that has not been prescribed by your health care provider.  Do not sign important papers or make important decisions.  You may resume a normal diet and activities as directed by your health care provider.  Change bandages (dressings) as directed.  If you have questions or problems that seem related to general anesthesia, call the hospital and ask for the anesthetist or anesthesiologist on call. SEEK MEDICAL CARE IF:  You have nausea and vomiting that continue the day after anesthesia.  You develop a rash. SEEK IMMEDIATE MEDICAL CARE IF:   You have difficulty breathing.  You have chest pain.  You have any allergic problems.   This information is not intended to replace advice given to you by your health care provider. Make sure you discuss any questions you have with your health care provider.   Document Released: 04/28/2000 Document Revised: 02/10/2014  Document Reviewed: 05/21/2011 Elsevier Interactive Patient Education [employer].

## 2015-10-12 ENCOUNTER — Encounter (HOSPITAL_COMMUNITY)
Admission: RE | Disposition: A | Discharge status: Home / Self Care | Payer: Self-pay | Source: Ambulatory Visit | Attending: General Surgery

## 2015-10-12 ENCOUNTER — Encounter (HOSPITAL_COMMUNITY): Payer: Self-pay | Admitting: Anesthesiology

## 2015-10-12 ENCOUNTER — Ambulatory Visit (HOSPITAL_COMMUNITY): Payer: Medicare Other | Admitting: Anesthesiology

## 2015-10-12 ENCOUNTER — Ambulatory Visit (HOSPITAL_COMMUNITY)
Admission: RE | Admit: 2015-10-12 | Discharge: 2015-10-12 | Disposition: A | Discharge status: Home / Self Care | Payer: Medicare Other | Source: Ambulatory Visit | Attending: General Surgery | Admitting: General Surgery

## 2015-10-12 DIAGNOSIS — G4730 Sleep apnea, unspecified: Secondary | ICD-10-CM | POA: Diagnosis not present

## 2015-10-12 DIAGNOSIS — E7800 Pure hypercholesterolemia, unspecified: Secondary | ICD-10-CM | POA: Insufficient documentation

## 2015-10-12 DIAGNOSIS — Z6837 Body mass index (BMI) 37.0-37.9, adult: Secondary | ICD-10-CM | POA: Diagnosis not present

## 2015-10-12 DIAGNOSIS — I10 Essential (primary) hypertension: Secondary | ICD-10-CM | POA: Diagnosis not present

## 2015-10-12 DIAGNOSIS — K219 Gastro-esophageal reflux disease without esophagitis: Secondary | ICD-10-CM | POA: Diagnosis not present

## 2015-10-12 DIAGNOSIS — K811 Chronic cholecystitis: Secondary | ICD-10-CM | POA: Insufficient documentation

## 2015-10-12 DIAGNOSIS — Z7982 Long term (current) use of aspirin: Secondary | ICD-10-CM | POA: Diagnosis not present

## 2015-10-12 DIAGNOSIS — F419 Anxiety disorder, unspecified: Secondary | ICD-10-CM | POA: Diagnosis not present

## 2015-10-12 DIAGNOSIS — K8010 Calculus of gallbladder with chronic cholecystitis without obstruction: Secondary | ICD-10-CM | POA: Diagnosis not present

## 2015-10-12 DIAGNOSIS — Z87891 Personal history of nicotine dependence: Secondary | ICD-10-CM | POA: Insufficient documentation

## 2015-10-12 DIAGNOSIS — Z79899 Other long term (current) drug therapy: Secondary | ICD-10-CM | POA: Diagnosis not present

## 2015-10-12 HISTORY — PX: CHOLECYSTECTOMY: SHX55

## 2015-10-12 SURGERY — LAPAROSCOPIC CHOLECYSTECTOMY
Anesthesia: General | Site: Abdomen

## 2015-10-12 MED ORDER — OXYCODONE-ACETAMINOPHEN 5-325 MG PO TABS: 1.0000 | ORAL_TABLET | ORAL | 0 refills | Status: DC | PRN

## 2015-10-12 MED ORDER — SUGAMMADEX SODIUM 500 MG/5ML IV SOLN
INTRAVENOUS | Status: DC | PRN
  Administered 2015-10-12: 300 mg via INTRAVENOUS

## 2015-10-12 MED ORDER — PROPOFOL 10 MG/ML IV BOLUS
INTRAVENOUS | Status: DC | PRN
  Administered 2015-10-12: 250 mg via INTRAVENOUS

## 2015-10-12 MED ORDER — GLYCOPYRROLATE 0.2 MG/ML IJ SOLN
INTRAMUSCULAR | Status: AC
  Filled 2015-10-12: qty 1

## 2015-10-12 MED ORDER — MIDAZOLAM HCL 2 MG/2ML IJ SOLN
INTRAMUSCULAR | Status: AC
  Filled 2015-10-12: qty 2

## 2015-10-12 MED ORDER — EPHEDRINE SULFATE 50 MG/ML IJ SOLN
INTRAMUSCULAR | Status: DC | PRN
  Administered 2015-10-12: 10 mg via INTRAVENOUS

## 2015-10-12 MED ORDER — EPHEDRINE SULFATE 50 MG/ML IJ SOLN
INTRAMUSCULAR | Status: AC
  Filled 2015-10-12: qty 1

## 2015-10-12 MED ORDER — HEMOSTATIC AGENTS (NO CHARGE) OPTIME
TOPICAL | Status: DC | PRN
  Administered 2015-10-12: 1 via TOPICAL

## 2015-10-12 MED ORDER — CIPROFLOXACIN IN D5W 400 MG/200ML IV SOLN
400.0000 mg | INTRAVENOUS | Status: AC
  Administered 2015-10-12: 400 mg via INTRAVENOUS
  Filled 2015-10-12: qty 200

## 2015-10-12 MED ORDER — LIDOCAINE HCL (PF) 1 % IJ SOLN
INTRAMUSCULAR | Status: AC
  Filled 2015-10-12: qty 5

## 2015-10-12 MED ORDER — MIDAZOLAM HCL 2 MG/2ML IJ SOLN
1.0000 mg | INTRAMUSCULAR | Status: DC | PRN
  Administered 2015-10-12 (×2): 2 mg via INTRAVENOUS
  Filled 2015-10-12: qty 2

## 2015-10-12 MED ORDER — ONDANSETRON HCL 4 MG/2ML IJ SOLN
4.0000 mg | Freq: Once | INTRAMUSCULAR | Status: AC
  Administered 2015-10-12: 4 mg via INTRAVENOUS

## 2015-10-12 MED ORDER — FENTANYL CITRATE (PF) 100 MCG/2ML IJ SOLN
INTRAMUSCULAR | Status: DC | PRN
  Administered 2015-10-12: 50 ug via INTRAVENOUS

## 2015-10-12 MED ORDER — PROPOFOL 10 MG/ML IV BOLUS
INTRAVENOUS | Status: AC
  Filled 2015-10-12: qty 40

## 2015-10-12 MED ORDER — SODIUM CHLORIDE 0.9 % IR SOLN
Status: DC | PRN
  Administered 2015-10-12: 1000 mL

## 2015-10-12 MED ORDER — DEXAMETHASONE SODIUM PHOSPHATE 4 MG/ML IJ SOLN
4.0000 mg | Freq: Once | INTRAMUSCULAR | Status: AC
  Administered 2015-10-12: 4 mg via INTRAVENOUS
  Filled 2015-10-12: qty 1

## 2015-10-12 MED ORDER — SUCCINYLCHOLINE CHLORIDE 20 MG/ML IJ SOLN
INTRAMUSCULAR | Status: DC | PRN
  Administered 2015-10-12: 160 mg via INTRAVENOUS

## 2015-10-12 MED ORDER — FENTANYL CITRATE (PF) 100 MCG/2ML IJ SOLN
INTRAMUSCULAR | Status: AC
  Filled 2015-10-12: qty 2

## 2015-10-12 MED ORDER — CHLORHEXIDINE GLUCONATE CLOTH 2 % EX PADS: 6.0000 | MEDICATED_PAD | Freq: Once | CUTANEOUS | Status: DC

## 2015-10-12 MED ORDER — ONDANSETRON HCL 4 MG/2ML IJ SOLN
INTRAMUSCULAR | Status: AC
  Filled 2015-10-12: qty 2

## 2015-10-12 MED ORDER — GLYCOPYRROLATE 0.2 MG/ML IJ SOLN
0.2000 mg | Freq: Once | INTRAMUSCULAR | Status: AC
  Administered 2015-10-12: 0.2 mg via INTRAVENOUS

## 2015-10-12 MED ORDER — POVIDONE-IODINE 10 % EX OINT
TOPICAL_OINTMENT | CUTANEOUS | Status: AC
  Filled 2015-10-12: qty 1

## 2015-10-12 MED ORDER — KETOROLAC TROMETHAMINE 30 MG/ML IJ SOLN
30.0000 mg | Freq: Once | INTRAMUSCULAR | Status: AC
  Administered 2015-10-12: 30 mg via INTRAVENOUS
  Filled 2015-10-12: qty 1

## 2015-10-12 MED ORDER — SODIUM CHLORIDE 0.9 % IJ SOLN
INTRAMUSCULAR | Status: AC
  Filled 2015-10-12: qty 10

## 2015-10-12 MED ORDER — BUPIVACAINE HCL (PF) 0.5 % IJ SOLN
INTRAMUSCULAR | Status: AC
  Filled 2015-10-12: qty 30

## 2015-10-12 MED ORDER — LACTATED RINGERS IV SOLN
INTRAVENOUS | Status: DC
  Administered 2015-10-12 (×2): via INTRAVENOUS

## 2015-10-12 MED ORDER — ROCURONIUM BROMIDE 50 MG/5ML IV SOLN
INTRAVENOUS | Status: AC
  Filled 2015-10-12: qty 1

## 2015-10-12 MED ORDER — SUGAMMADEX SODIUM 500 MG/5ML IV SOLN
INTRAVENOUS | Status: AC
  Filled 2015-10-12: qty 5

## 2015-10-12 MED ORDER — SUCCINYLCHOLINE CHLORIDE 20 MG/ML IJ SOLN
INTRAMUSCULAR | Status: AC
  Filled 2015-10-12: qty 1

## 2015-10-12 MED ORDER — ROCURONIUM BROMIDE 100 MG/10ML IV SOLN
INTRAVENOUS | Status: DC | PRN
  Administered 2015-10-12: 30 mg via INTRAVENOUS

## 2015-10-12 MED ORDER — SCOPOLAMINE 1 MG/3DAYS TD PT72: 1.0000 | MEDICATED_PATCH | Freq: Once | TRANSDERMAL | Status: DC

## 2015-10-12 MED ORDER — LIDOCAINE HCL (CARDIAC) 20 MG/ML IV SOLN
INTRAVENOUS | Status: DC | PRN
  Administered 2015-10-12: 50 mg via INTRAVENOUS

## 2015-10-12 MED ORDER — BUPIVACAINE HCL (PF) 0.5 % IJ SOLN
INTRAMUSCULAR | Status: DC | PRN
  Administered 2015-10-12: 10 mL

## 2015-10-12 MED ORDER — HYDROMORPHONE HCL 1 MG/ML IJ SOLN
0.2500 mg | INTRAMUSCULAR | Status: DC | PRN
  Administered 2015-10-12 (×4): 0.5 mg via INTRAVENOUS
  Filled 2015-10-12 (×2): qty 1

## 2015-10-12 SURGICAL SUPPLY — 53 items
APPLIER CLIP LAPSCP 10X32 DD (CLIP) ×3 IMPLANT
BAG HAMPER (MISCELLANEOUS) ×3 IMPLANT
BAG SPEC RTRVL LRG 6X4 10 (ENDOMECHANICALS) ×2
CHLORAPREP W/TINT 26ML (MISCELLANEOUS) ×3 IMPLANT
CLOTH BEACON ORANGE TIMEOUT ST (SAFETY) ×3 IMPLANT
COVER LIGHT HANDLE STERIS (MISCELLANEOUS) ×6 IMPLANT
DECANTER SPIKE VIAL GLASS SM (MISCELLANEOUS) ×3 IMPLANT
ELECT REM PT RETURN 9FT ADLT (ELECTROSURGICAL) ×3
ELECTRODE REM PT RTRN 9FT ADLT (ELECTROSURGICAL) ×2 IMPLANT
FILTER SMOKE EVAC LAPAROSHD (FILTER) ×3 IMPLANT
FORMALIN 10 PREFIL 120ML (MISCELLANEOUS) ×5 IMPLANT
GLOVE BIOGEL PI IND STRL 7.0 (GLOVE) ×2 IMPLANT
GLOVE BIOGEL PI INDICATOR 7.0 (GLOVE) ×1
GLOVE SURG SS PI 7.5 STRL IVOR (GLOVE) ×6 IMPLANT
GOWN STRL REUS W/ TWL XL LVL3 (GOWN DISPOSABLE) ×2 IMPLANT
GOWN STRL REUS W/TWL LRG LVL3 (GOWN DISPOSABLE) ×9 IMPLANT
GOWN STRL REUS W/TWL XL LVL3 (GOWN DISPOSABLE) ×3
HEMOSTAT SNOW SURGICEL 2X4 (HEMOSTASIS) ×3 IMPLANT
INST SET LAPROSCOPIC AP (KITS) ×3 IMPLANT
IV NS IRRIG 3000ML ARTHROMATIC (IV SOLUTION) IMPLANT
KIT ROOM TURNOVER APOR (KITS) ×3 IMPLANT
MANIFOLD NEPTUNE II (INSTRUMENTS) ×3 IMPLANT
NDL BIOPSY 14GX4.5 SOFT TIS (NEEDLE) IMPLANT
NDL BIOPSY 14X6 SOFT TISS (NEEDLE) IMPLANT
NDL HYPO 18GX1.5 BLUNT FILL (NEEDLE) IMPLANT
NDL HYPO 25X1 1.5 SAFETY (NEEDLE) ×1 IMPLANT
NDL INSUFFLATION 14GA 120MM (NEEDLE) ×1 IMPLANT
NEEDLE BIOPSY 14GX4.5 SOFT TIS (NEEDLE) IMPLANT
NEEDLE BIOPSY 14X6 SOFT TISS (NEEDLE) IMPLANT
NEEDLE HYPO 18GX1.5 BLUNT FILL (NEEDLE) IMPLANT
NEEDLE HYPO 25X1 1.5 SAFETY (NEEDLE) ×3 IMPLANT
NEEDLE INSUFFLATION 14GA 120MM (NEEDLE) ×3 IMPLANT
NS IRRIG 1000ML POUR BTL (IV SOLUTION) ×3 IMPLANT
PACK LAP CHOLE LZT030E (CUSTOM PROCEDURE TRAY) ×3 IMPLANT
PACK MINOR (CUSTOM PROCEDURE TRAY) ×1 IMPLANT
PAD ARMBOARD 7.5X6 YLW CONV (MISCELLANEOUS) ×3 IMPLANT
PAD TELFA 3X4 1S STER (GAUZE/BANDAGES/DRESSINGS) ×3 IMPLANT
POUCH SPECIMEN RETRIEVAL 10MM (ENDOMECHANICALS) ×3 IMPLANT
SET BASIN LINEN APH (SET/KITS/TRAYS/PACK) ×3 IMPLANT
SET TUBE IRRIG SUCTION NO TIP (IRRIGATION / IRRIGATOR) IMPLANT
SLEEVE ENDOPATH XCEL 5M (ENDOMECHANICALS) ×3 IMPLANT
SPONGE GAUZE 2X2 8PLY STRL LF (GAUZE/BANDAGES/DRESSINGS) ×12 IMPLANT
STAPLER VISISTAT (STAPLE) ×3 IMPLANT
STRIP CLOSURE SKIN 1/4X3 (GAUZE/BANDAGES/DRESSINGS) ×1 IMPLANT
SUT VIC AB 4-0 PS2 27 (SUTURE) ×3 IMPLANT
SUT VICRYL 0 UR6 27IN ABS (SUTURE) ×3 IMPLANT
TAPE CLOTH SURG 4X10 WHT LF (GAUZE/BANDAGES/DRESSINGS) ×2 IMPLANT
TROCAR ENDO BLADELESS 11MM (ENDOMECHANICALS) ×3 IMPLANT
TROCAR XCEL NON-BLD 5MMX100MML (ENDOMECHANICALS) ×3 IMPLANT
TROCAR XCEL UNIV SLVE 11M 100M (ENDOMECHANICALS) ×3 IMPLANT
TUBE CONNECTING 12X1/4 (SUCTIONS) ×3 IMPLANT
TUBING INSUFFLATION (TUBING) ×3 IMPLANT
WARMER LAPAROSCOPE (MISCELLANEOUS) ×3 IMPLANT

## 2015-10-12 NOTE — Anesthesia Procedure Notes (Signed)
Procedure Name: Intubation Date/Time: 10/12/2015 8:19 AM Performed by: Lieutenant Diego Pre-anesthesia Checklist: Patient identified, Emergency Drugs available, Suction available and Patient being monitored Patient Re-evaluated:Patient Re-evaluated prior to inductionOxygen Delivery Method: Circle system utilized Preoxygenation: Pre-oxygenation with 100% oxygen Intubation Type: IV induction Ventilation: Mask ventilation without difficulty Laryngoscope Size: Miller and 2 Grade View: Grade I Tube type: Oral Tube size: 8.0 mm Number of attempts: 1 Airway Equipment and Method: Stylet and Oral airway Placement Confirmation: ETT inserted through vocal cords under direct vision,  positive ETCO2 and breath sounds checked- equal and bilateral Secured at: 25 cm Tube secured with: Tape Dental Injury: Teeth and Oropharynx as per pre-operative assessment

## 2015-10-12 NOTE — Interval H&P Note (Signed)
History and Physical Interval Note:  10/12/2015 7:05 AM  Alexander Nunez  has presented today for surgery, with the diagnosis of chronic cholecystitis, elevated LFTs  The various methods of treatment have been discussed with the patient and family. After consideration of risks, benefits and other options for treatment, the patient has consented to  Procedure(s): LAPAROSCOPIC CHOLECYSTECTOMY (N/A) LIVER BIOPSY (N/A) as a surgical intervention .  The patient's history has been reviewed, patient examined, no change in status, stable for surgery.  I have reviewed the patient's chart and labs.  Questions were answered to the patient's satisfaction.     Aviva Signs A

## 2015-10-12 NOTE — Discharge Instructions (Signed)
Laparoscopic Cholecystectomy, Care After °Refer to this sheet in the next few weeks. These instructions provide you with information about caring for yourself after your procedure. Your health care provider may also give you more specific instructions. Your treatment has been planned according to current medical practices, but problems sometimes occur. Call your health care provider if you have any problems or questions after your procedure. °WHAT TO EXPECT AFTER THE PROCEDURE °After your procedure, it is common to have: °· Pain at your incision sites. You will be given pain medicines to control your pain. °· Mild nausea or vomiting. This should improve after the first 24 hours. °· Bloating and possible shoulder pain from the gas that was used during the procedure. This will improve after the first 24 hours. °HOME CARE INSTRUCTIONS °Incision Care °· Follow instructions from your health care provider about how to take care of your incisions. Make sure you: °¨ Wash your hands with soap and water before you change your bandage (dressing). If soap and water are not available, use hand sanitizer. °¨ Change your dressing as told by your health care provider. °¨ Leave stitches (sutures), skin glue, or adhesive strips in place. These skin closures may need to be in place for 2 weeks or longer. If adhesive strip edges start to loosen and curl up, you may trim the loose edges. Do not remove adhesive strips completely unless your health care provider tells you to do that. °· Do not take baths, swim, or use a hot tub until your health care provider approves. Ask your health care provider if you can take showers. You may only be allowed to take sponge baths for bathing. °General Instructions °· Take over-the-counter and prescription medicines only as told by your health care provider. °· Do not drive or operate heavy machinery while taking prescription pain medicine. °· Return to your normal diet as told by your health care  provider. °· Do not lift anything that is heavier than 10 lb (4.5 kg). °· Do not play contact sports for one week or until your health care provider approves. °SEEK MEDICAL CARE IF:  °· You have redness, swelling, or pain at the site of your incision. °· You have fluid, blood, or pus coming from your incision. °· You notice a bad smell coming from your incision area. °· Your surgical incisions break open. °· You have a fever. °SEEK IMMEDIATE MEDICAL CARE IF: °· You develop a rash. °· You have difficulty breathing. °· You have chest pain. °· You have increasing pain in your shoulders (shoulder strap areas). °· You faint or have dizzy episodes while you are standing. °· You have severe pain in your abdomen. °· You have nausea or vomiting that lasts for more than one day. °  °This information is not intended to replace advice given to you by your health care provider. Make sure you discuss any questions you have with your health care provider. °  °Document Released: 01/20/2005 Document Revised: 10/11/2014 Document Reviewed: 09/01/2012 °Elsevier Interactive Patient Education ©2016 [employer] ° ° °PATIENT INSTRUCTIONS °POST-ANESTHESIA ° °IMMEDIATELY FOLLOWING SURGERY:  Do not drive or operate machinery for the first twenty four hours after surgery.  Do not make any important decisions for twenty four hours after surgery or while taking narcotic pain medications or sedatives.  If you develop intractable nausea and vomiting or a severe headache please notify your doctor immediately. ° °FOLLOW-UP:  Please make an appointment with your surgeon as instructed. You do not need to   follow up with anesthesia unless specifically instructed to do so. ° °WOUND CARE INSTRUCTIONS (if applicable):  Keep a dry clean dressing on the anesthesia/puncture wound site if there is drainage.  Once the wound has quit draining you may leave it open to air.  Generally you should leave the bandage intact for twenty four hours unless there is  drainage.  If the epidural site drains for more than 36-48 hours please call the anesthesia department. ° °QUESTIONS?:  Please feel free to call your physician or the hospital operator if you have any questions, and they will be happy to assist you.    ° ° ° °

## 2015-10-12 NOTE — Transfer of Care (Signed)
Immediate Anesthesia Transfer of Care Note  Patient: Alexander Nunez  Procedure(s) Performed: Procedure(s): LAPAROSCOPIC CHOLECYSTECTOMY (N/A)  Patient Location: PACU  Anesthesia Type:General  Level of Consciousness: awake and alert   Airway & Oxygen Therapy: Patient Spontanous Breathing and Patient connected to face mask oxygen  Post-op Assessment: Report given to RN and Post -op Vital signs reviewed and stable  Post vital signs: Reviewed and stable  Last Vitals:  Vitals:   10/12/15 0800 10/12/15 0805  BP: (!) 107/56 (!) 104/59  Resp: (!) 21 15  Temp:      Last Pain:  Vitals:   10/12/15 0738  TempSrc: Oral  PainSc: 5       Patients Stated Pain Goal: 6 ([id number])  Complications: No apparent anesthesia complications

## 2015-10-12 NOTE — Op Note (Signed)
Patient:  Alexander Nunez  DOB:  [birth year]  MRN:  [id number]   Preop Diagnosis:  Chronic cholecystitis  Postop Diagnosis:  Same  Procedure:  Laparoscopic cholecystectomy  Surgeon:  Aviva Signs, M.D.  Assistant: Tama High, M.D.  Anes:  Gen. endotracheal  Indications:  Patient is a 70 year old white male who presents with biliary colic secondary to chronic cholecystitis. The risks and benefits of the procedure including bleeding, infection, hepatobiliary injury, the possibility of an open procedure were fully explained to the patient, who gave informed consent.  Procedure note:  The patient was placed the supine position. After induction of general endotracheal anesthesia, the abdomen was prepped and draped using the usual sterile technique with DuraPrep. Surgical site confirmation was performed.  A supraumbilical incision was made down to the fascia. A Veress needle was introduced into the abdominal cavity and confirmation of placement was done using the saline drop test. The abdomen was then insufflated to 16 mmHg pressure. An 11 mm trocar was introduced into the abdominal cavity under direct visualization without difficulty. The patient was placed in reverse Trendelenburg position and an additional 11 mm trocar was placed the epigastric region and 5 mm trochars were placed the right upper quadrant and right flank regions. Liver was inspected and noted to be within normal limits. As the liver appeared normal and his LFTs were normal, a liver biopsy was not performed. The gallbladder was retracted in a dynamic fashion in order to provide a critical view of the triangle of [place]ot. The cystic duct was first identified. Its junction to the infundibulum was fully identified. Endoclips placed proximally and distally on the cystic duct, and the cystic duct was divided. This was likewise done cystic artery. The gallbladder was freed away from the gallbladder fossa using Bovie electrocautery. The  gallbladder was delivered through the epigastric trocar site using an Endo Catch bag. The gallbladder fossa was inspected and no abnormal bleeding or bile leakage was noted. Surgicel was placed the gallbladder fossa. All fluid and air were then evacuated from the abdominal cavity prior to removal of the trochars.  All wounds were irrigated with normal saline. All wounds were injected with 0.5% Sensorcaine. The supraumbilical fascia as well as epigastric fascia were reapproximated using 0 Vicryl interrupted sutures. All skin incisions were closed using staples. Betadine ointment and dry sterile dressings were applied.  All tape and needle counts were correct at the end of the procedure. The patient was transferred to PACU in stable condition.  Complications:  None  EBL:  Minimal  Specimen:  Gallbladder

## 2015-10-12 NOTE — Anesthesia Postprocedure Evaluation (Signed)
Anesthesia Post Note  Patient: Alexander Nunez  Procedure(s) Performed: Procedure(s) (LRB): LAPAROSCOPIC CHOLECYSTECTOMY (N/A)  Patient location during evaluation: PACU Anesthesia Type: General Level of consciousness: awake and alert and oriented Pain management: pain level controlled Vital Signs Assessment: post-procedure vital signs reviewed and stable Respiratory status: spontaneous breathing Cardiovascular status: blood pressure returned to baseline Postop Assessment: no signs of nausea or vomiting Anesthetic complications: no    Last Vitals:  Vitals:   10/12/15 1000 10/12/15 1015  BP:  (!) 119/56  Pulse: 62 64  Resp: 20 18  Temp:  36.5 C    Last Pain:  Vitals:   10/12/15 1015  TempSrc: Oral  PainSc: 7                  Banks Chaikin

## 2015-10-12 NOTE — Anesthesia Preprocedure Evaluation (Signed)
Anesthesia Evaluation  Patient identified by MRN, date of birth, ID band Patient awake    Reviewed: Allergy & Precautions, NPO status , Patient's Chart, lab work & pertinent test results  Airway Mallampati: III  TM Distance: >3 FB     Dental  (+) Partial Upper, Partial Lower   Pulmonary sleep apnea , former smoker,    breath sounds clear to auscultation       Cardiovascular hypertension, Pt. on medications  Rhythm:Regular Rate:Normal     Neuro/Psych PSYCHIATRIC DISORDERS Anxiety    GI/Hepatic GERD  ,  Endo/Other  Morbid obesity  Renal/GU      Musculoskeletal   Abdominal   Peds  Hematology   Anesthesia Other Findings   Reproductive/Obstetrics                             Anesthesia Physical Anesthesia Plan  ASA: III  Anesthesia Plan: General   Post-op Pain Management:    Induction: Intravenous, Rapid sequence and Cricoid pressure planned  Airway Management Planned: Oral ETT  Additional Equipment:   Intra-op Plan:   Post-operative Plan: Extubation in OR  Informed Consent: I have reviewed the patients History and Physical, chart, labs and discussed the procedure including the risks, benefits and alternatives for the proposed anesthesia with the patient or authorized representative who has indicated his/her understanding and acceptance.     Plan Discussed with:   Anesthesia Plan Comments:         Anesthesia Quick Evaluation

## 2015-10-15 MED ORDER — POVIDONE-IODINE 10 % OINT PACKET
TOPICAL_OINTMENT | CUTANEOUS | Status: DC | PRN
  Administered 2015-10-12: 1 via TOPICAL

## 2015-10-17 ENCOUNTER — Encounter (HOSPITAL_COMMUNITY): Payer: Self-pay | Admitting: General Surgery

## 2015-11-02 ENCOUNTER — Ambulatory Visit (HOSPITAL_COMMUNITY)
Admission: RE | Admit: 2015-11-02 | Discharge: 2015-11-02 | Disposition: A | Discharge status: Home / Self Care | Payer: Medicare Other | Source: Ambulatory Visit | Attending: Family Medicine | Admitting: Family Medicine

## 2015-11-02 ENCOUNTER — Other Ambulatory Visit (HOSPITAL_COMMUNITY): Payer: Self-pay | Admitting: Family Medicine

## 2015-11-02 DIAGNOSIS — X58XXXA Exposure to other specified factors, initial encounter: Secondary | ICD-10-CM | POA: Diagnosis not present

## 2015-11-02 DIAGNOSIS — S8992XA Unspecified injury of left lower leg, initial encounter: Secondary | ICD-10-CM | POA: Insufficient documentation

## 2015-11-02 DIAGNOSIS — S80812A Abrasion, left lower leg, initial encounter: Secondary | ICD-10-CM | POA: Diagnosis not present

## 2015-11-02 DIAGNOSIS — S81819A Laceration without foreign body, unspecified lower leg, initial encounter: Secondary | ICD-10-CM | POA: Diagnosis not present

## 2015-11-02 DIAGNOSIS — S8012XA Contusion of left lower leg, initial encounter: Secondary | ICD-10-CM | POA: Diagnosis not present

## 2015-11-02 DIAGNOSIS — Z23 Encounter for immunization: Secondary | ICD-10-CM | POA: Diagnosis not present

## 2015-11-02 DIAGNOSIS — Z6839 Body mass index (BMI) 39.0-39.9, adult: Secondary | ICD-10-CM | POA: Diagnosis not present

## 2015-11-03 ENCOUNTER — Emergency Department (HOSPITAL_COMMUNITY): Payer: Medicare Other

## 2015-11-03 ENCOUNTER — Emergency Department (HOSPITAL_COMMUNITY)
Admission: EM | Admit: 2015-11-03 | Discharge: 2015-11-03 | Disposition: A | Discharge status: Home / Self Care | Payer: Medicare Other | Attending: Emergency Medicine | Admitting: Emergency Medicine

## 2015-11-03 ENCOUNTER — Encounter (HOSPITAL_COMMUNITY): Payer: Self-pay | Admitting: Emergency Medicine

## 2015-11-03 DIAGNOSIS — S5012XD Contusion of left forearm, subsequent encounter: Secondary | ICD-10-CM | POA: Diagnosis not present

## 2015-11-03 DIAGNOSIS — Z87891 Personal history of nicotine dependence: Secondary | ICD-10-CM | POA: Insufficient documentation

## 2015-11-03 DIAGNOSIS — M25572 Pain in left ankle and joints of left foot: Secondary | ICD-10-CM | POA: Diagnosis not present

## 2015-11-03 DIAGNOSIS — W0110XD Fall on same level from slipping, tripping and stumbling with subsequent striking against unspecified object, subsequent encounter: Secondary | ICD-10-CM | POA: Diagnosis not present

## 2015-11-03 DIAGNOSIS — M79672 Pain in left foot: Secondary | ICD-10-CM | POA: Diagnosis not present

## 2015-11-03 DIAGNOSIS — Z79899 Other long term (current) drug therapy: Secondary | ICD-10-CM | POA: Insufficient documentation

## 2015-11-03 DIAGNOSIS — Z7982 Long term (current) use of aspirin: Secondary | ICD-10-CM | POA: Insufficient documentation

## 2015-11-03 DIAGNOSIS — I10 Essential (primary) hypertension: Secondary | ICD-10-CM | POA: Diagnosis not present

## 2015-11-03 DIAGNOSIS — S99922A Unspecified injury of left foot, initial encounter: Secondary | ICD-10-CM | POA: Diagnosis not present

## 2015-11-03 DIAGNOSIS — S80812D Abrasion, left lower leg, subsequent encounter: Secondary | ICD-10-CM | POA: Insufficient documentation

## 2015-11-03 MED ORDER — HYDROCODONE-ACETAMINOPHEN 5-325 MG PO TABS: 2.0000 | ORAL_TABLET | ORAL | 0 refills | Status: DC | PRN

## 2015-11-03 MED ORDER — HYDROCODONE-ACETAMINOPHEN 5-325 MG PO TABS
2.0000 | ORAL_TABLET | Freq: Once | ORAL | Status: DC
  Filled 2015-11-03: qty 2

## 2015-11-03 NOTE — ED Provider Notes (Signed)
The patient is a 70 year old male, he is not a diabetic, he has no history of gout, he reports that he had a fall 4 days ago in his yard, he believes that the handle of his Hoe struck his mid left shin, this caused a contusion with an open wound, he was seen by his doctor yesterday who ordered an x-ray and started him on topical antibiotic cream and an oral antibiotic to prevent infection. Last night when he went to bed he was not having any foot pain however this morning is he awoke and took off the compression sock that it was recommended that he wear (work for the first time last night) he had severe pain in his foot with walking. There was no injury to the foot and he was walking normal on it yesterday. He denies swelling redness or fevers, he has no history of gout, on exam the patient has normal appearing foot and ankle with no tenderness over the leg except for a small amount of tenderness surrounding the superficial well-appearing wound of the left anterior tibia. He has no tenderness over the ankle, no tenderness over the great and second toe but with any other manipulation of the foot including range of motion of the ankle or palpation over the base of the fifth metatarsal, range of motion of the foot or the lateral 3 toes there is significant pain that radiates to the dorsum of the foot. There is no rash in this area, there is no subcutaneous emphysema, there is no crepitance, no redness, no warmth in the foot appears symmetrical with the other side. The pulses are normal, the capillary refill time is normal, we'll obtain imaging to rule out other sources of injury, this could be a gout attack prompted by atraumatic episode, this could be a nerve issue related to the compression stocking, otherwise the patient is well-appearing with no tachycardia or other signs of significant injury or illness. The patient is in agreement with the plan.  Medical screening examination/treatment/procedure(s) were  conducted as a shared visit with non-physician practitioner(s) and myself.  I personally evaluated the patient during the encounter.  Clinical Impression:   Final diagnoses:  Foot pain, left         Noemi Chapel, MD 11/03/15 [phone]

## 2015-11-03 NOTE — ED Notes (Signed)
Patient returned from X-ray 

## 2015-11-03 NOTE — ED Triage Notes (Signed)
Patient c/o left foot pain. Per patient recently hit shin in which he has bandage over, had x-ray (no fractures but tissue damage), given antibiotics and told to wear compression stockings. Patient states woke this morning with pain in foot and inability to bear weight on foot. Denies pain to touch. Per patient thought could be circulation "cut off" due to stockings but no relief after removing stocking. Pedal pule present and strong.

## 2015-11-03 NOTE — Discharge Instructions (Signed)
Continue bactroban ointment.   Stop using compression sock.   Fill antibiotic.   Take pain medication as needed.  See your Physicain for recheck in 2-3 days

## 2015-11-03 NOTE — ED Provider Notes (Signed)
[facility] DEPT Provider Note   CSN: [id number] Arrival date & time: 11/03/15  [id number]  By signing my name below, I, Hansel Feinstein, attest that this documentation has been prepared under the direction and in the presence of Rani Sisney K. Arabelle Bollig, PA-C. Electronically Signed: Hansel Feinstein, ED Scribe. 11/03/15. 9:51 AM.    History   Chief Complaint Chief Complaint  Patient presents with  . Foot Pain    HPI Alexander Nunez is a 70 y.o. male who presents to the Emergency Department complaining of moderate, throbbing plantar left foot pain onset at 1 am this morning. Pt states that 3 days ago, he was cut on his left shin by a hoe after tripping on the object and subsequently fell into a bush, sustaining abrasions to his left arm. He denies LOC, head injury or additional injuries. He states he saw his PCP, Dr. Hilma Favors, yesterday for evaluation of his injuries and wound care, and was prescribed Bactroban and oral antibiotics. Pt also had XR yesterday showing soft tissue damage, but no acute bony abnormality. He states he has not started his course of antibiotics. Pt states he is not aware of any specific injury to his foot that could have led to his current pain. Pt states his pain is exacerbated with weight-bearing. Pt notes he was also instructed to apply his compression stocking, and reports that he did so until yesterday afternoon. No h/o DM, HTN. Pt is a non-smoker. He denies fever, numbness or tingling to BLE.   The history is provided by the patient. No language interpreter was used.    Past Medical History:  Diagnosis Date  . Anxiety   . Back pain, chronic   . Colon polyp   . GERD (gastroesophageal reflux disease)   . Heart palpitations   . High cholesterol   . Hypertension   . Irritable bowel syndrome   . Osteoporosis   . Psoriasis     Patient Active Problem List   Diagnosis Date Noted  . Atypical chest pain 05/04/2014  . Sleep apnea 05/04/2014  . Abdominal bloating 07/22/2013  .  Spermatocele 05/02/2013  . Chronic prostatitis 01/31/2013  . Pain in testicle 01/31/2013  . Weakness of left leg 08/12/2012  . Generalized abdominal pain 09/02/2011  . Lumbar canal stenosis 05/28/2011  . Degenerative arthritis of lumbar spine 05/28/2011  . SPL (spondylolisthesis) 05/28/2011  . PERSONAL HX COLONIC POLYPS 04/19/2009  . IRRITABLE BOWEL SYNDROME 04/06/2008  . ABDOMINAL BLOATING 03/07/2008  . CHANGE IN BOWELS 03/07/2008  . ABDOMINAL PAIN-MULTIPLE SITES 03/07/2008  . Essential hypertension 03/06/2008  . GERD 03/06/2008  . GASTRITIS 03/06/2008  . CHRONIC RESPIRATORY DISEASE ARISE PERINTL PERIOD 03/06/2008  . HYPERLIPIDEMIA 02/06/2003    Past Surgical History:  Procedure Laterality Date  . BACK SURGERY  2015  . CHOLECYSTECTOMY N/A 10/12/2015   Procedure: LAPAROSCOPIC CHOLECYSTECTOMY;  Surgeon: Aviva Signs, MD;  Location: [facility];  Service: General;  Laterality: N/A;  . FOOT FRACTURE SURGERY     left foot  . KNEE ARTHROSCOPY     left       Home Medications    Prior to Admission medications   Medication Sig Start Date End Date Taking? Authorizing Provider  ALPRAZolam Duanne Moron) 0.5 MG tablet Take 0.25-0.5 mg by mouth at bedtime as needed for sleep.     Historical Provider, MD  aspirin EC 81 MG tablet Take 81 mg by mouth daily.    Historical Provider, MD  doxazosin (CARDURA) 4 MG tablet Take 4 mg by  mouth at bedtime.      Historical Provider, MD  folic acid (FOLVITE) 1 MG tablet Take 1 mg by mouth every evening.     Historical Provider, MD  lisinopril (PRINIVIL,ZESTRIL) 10 MG tablet Take 10 mg by mouth every evening.     Historical Provider, MD  methotrexate (RHEUMATREX) 2.5 MG tablet Take 5 mg by mouth once a week. Take 2 tab weekly on Wednesday Caution:Chemotherapy. Protect from light.    Historical Provider, MD  metoprolol tartrate (LOPRESSOR) 25 MG tablet Take 1 tablet (25 mg total) by mouth 2 (two) times daily. 05/05/14   Radene Gunning, NP  oxyCODONE-acetaminophen  (PERCOCET/ROXICET) 5-325 MG tablet Take 1-2 tablets by mouth every 4 (four) hours as needed for moderate pain or severe pain. 10/12/15   Aviva Signs, MD  pantoprazole (PROTONIX) 40 MG tablet Take 1 tablet (40 mg total) by mouth 2 (two) times daily. Patient taking differently: Take 40 mg by mouth every morning.  10/07/11   Ladene Artist, MD  pravastatin (PRAVACHOL) 20 MG tablet Take 20 mg by mouth every evening.     Historical Provider, MD  Probiotic Product (PROBIOTIC PO) Take 1 capsule by mouth daily.    Historical Provider, MD    Family History Family History  Problem Relation Age of Onset  . Arrhythmia Mother     Atrial fib  . Arrhythmia Sister     Atrial fib  . Stroke Sister   . Cancer Father     Bladder, Deceased   . Cancer Brother   . Colon cancer Neg Hx     Social History Social History  Substance Use Topics  . Smoking status: Former Smoker    Packs/day: 2.00    Types: Cigarettes    Start date: 05/22/1962    Quit date: 05/21/1973  . Smokeless tobacco: Never Used     Comment: Quit 25 yrs now   . Alcohol use 8.4 oz/week    14 Shots of liquor per week     Comment: Rare      Allergies   Cephalexin and Contrast media [iodinated diagnostic agents]   Review of Systems Review of Systems  Constitutional: Negative for fever.  Skin: Positive for wound.  Neurological: Negative for numbness.  All other systems reviewed and are negative.   Physical Exam Updated Vital Signs BP 146/65 (BP Location: Right Arm)   Pulse 76   Temp 98 F (36.7 C) (Oral)   Resp 18   Ht 5\' 11"  (1.803 m)   Wt 272 lb (123.4 kg)   SpO2 99%   BMI 37.94 kg/m   Physical Exam  Constitutional: He is oriented to person, place, and time. He appears well-developed and well-nourished.  HENT:  Head: Normocephalic.  Eyes: EOM are normal.  Neck: Normal range of motion.  Pulmonary/Chest: Effort normal.  Abdominal: He exhibits no distension.  Musculoskeletal: Normal range of motion. He exhibits  tenderness.  His left heel is tender to touch and percussion. DP pulses intact.   Neurological: He is alert and oriented to person, place, and time.  Skin: There is erythema.  Bruises to his left forearm and abrasions to his left forearm. He has a 6 x 7 cm area of abrasion to the left mid shin with slight erythema surrounding it.   Psychiatric: He has a normal mood and affect.  Nursing note and vitals reviewed.    ED Treatments / Results   DIAGNOSTIC STUDIES: Oxygen Saturation is 99% on RA, normal by my  interpretation.    COORDINATION OF CARE: 9:44 AM Discussed treatment plan with pt at bedside which includes PO antibiotics and pt agreed to plan.    Labs (all labs ordered are listed, but only abnormal results are displayed) Labs Reviewed - No data to display  EKG  EKG Interpretation None       Radiology Dg Tibia/fibula Left  Result Date: 11/02/2015 CLINICAL DATA:  The patient tripped over a HO Wednesday afternoon and has laceration in the mid shin region. Painful to walk. EXAM: LEFT TIBIA AND FIBULA - 2 VIEW COMPARISON:  None in PACs FINDINGS: The the shafts of the left tibia and fibula are adequately mineralized and intact. There is no lytic or blastic lesion or periosteal reaction. There is mild soft tissue swelling in the mid pretibial region. No soft tissue foreign bodies or gas collections are observed. The observed portions of the left knee and left ankle are unremarkable. IMPRESSION: Soft tissue injury over the mid shin region. No radiopaque foreign bodies are observed. No underlying bony abnormality. Electronically Signed   By: David  Martinique M.D.   On: 11/02/2015 14:01    Procedures Procedures (including critical care time)  Medications Ordered in ED Medications - No data to display   Initial Impression / Assessment and Plan / ED Course  I have reviewed the triage vital signs and the nursing notes.  Pertinent labs & imaging results that were available during my  care of the patient were reviewed by me and considered in my medical decision making (see chart for details).  Clinical Course    Meds ordered this encounter  Medications  . HYDROcodone-acetaminophen (NORCO/VICODIN) 5-325 MG tablet    Sig: Take 2 tablets by mouth every 4 (four) hours as needed.    Dispense:  10 tablet    Refill:  0    Order Specific Question:   Supervising Provider    Answer:   Noemi Chapel [[id number]]    Final Clinical Impressions(s) / ED Diagnoses   Final diagnoses:  Foot pain, left    New Prescriptions New Prescriptions   No medications on file   Meds ordered this encounter  Medications  . HYDROcodone-acetaminophen (NORCO/VICODIN) 5-325 MG tablet    Sig: Take 2 tablets by mouth every 4 (four) hours as needed.    Dispense:  10 tablet    Refill:  0    Order Specific Question:   Supervising Provider    Answer:   Noemi Chapel [[id number]]   An After Visit Summary was printed and given to the patient.   Hollace Kinn[place], PA-C 11/03/15 [place], MD 11/03/15 [phone]

## 2015-11-03 NOTE — ED Notes (Signed)
MD at bedside. 

## 2015-11-08 ENCOUNTER — Encounter (HOSPITAL_COMMUNITY): Payer: Self-pay

## 2015-11-08 ENCOUNTER — Emergency Department (HOSPITAL_COMMUNITY)
Admission: EM | Admit: 2015-11-08 | Discharge: 2015-11-08 | Disposition: A | Discharge status: Home / Self Care | Payer: Medicare Other | Attending: Emergency Medicine | Admitting: Emergency Medicine

## 2015-11-08 DIAGNOSIS — L03116 Cellulitis of left lower limb: Secondary | ICD-10-CM | POA: Diagnosis not present

## 2015-11-08 DIAGNOSIS — I10 Essential (primary) hypertension: Secondary | ICD-10-CM | POA: Insufficient documentation

## 2015-11-08 DIAGNOSIS — Z792 Long term (current) use of antibiotics: Secondary | ICD-10-CM | POA: Diagnosis not present

## 2015-11-08 DIAGNOSIS — Z79899 Other long term (current) drug therapy: Secondary | ICD-10-CM | POA: Diagnosis not present

## 2015-11-08 DIAGNOSIS — Z87891 Personal history of nicotine dependence: Secondary | ICD-10-CM | POA: Insufficient documentation

## 2015-11-08 DIAGNOSIS — M7989 Other specified soft tissue disorders: Secondary | ICD-10-CM | POA: Diagnosis present

## 2015-11-08 LAB — COMPREHENSIVE METABOLIC PANEL
ALBUMIN: 4 g/dL (ref 3.5–5.0)
ALK PHOS: 68 U/L (ref 38–126)
ALT: 23 U/L (ref 17–63)
AST: 18 U/L (ref 15–41)
Anion gap: 9 (ref 5–15)
BILIRUBIN TOTAL: 0.9 mg/dL (ref 0.3–1.2)
BUN: 18 mg/dL (ref 6–20)
CO2: 21 mmol/L — ABNORMAL LOW (ref 22–32)
CREATININE: 1.32 mg/dL — AB (ref 0.61–1.24)
Calcium: 9.3 mg/dL (ref 8.9–10.3)
Chloride: 108 mmol/L (ref 101–111)
GFR calc Af Amer: >60 mL/min (ref >60)
GFR, EST NON AFRICAN AMERICAN: 53 mL/min — AB (ref >60)
GLUCOSE: 99 mg/dL (ref 65–99)
Potassium: 3.6 mmol/L (ref 3.5–5.1)
SODIUM: 138 mmol/L (ref 135–145)
TOTAL PROTEIN: 7.3 g/dL (ref 6.5–8.1)

## 2015-11-08 LAB — CBC WITH DIFFERENTIAL/PLATELET
BASOS PCT: 0 %
Basophils Absolute: 0 10*3/uL (ref 0.0–0.1)
Eosinophils Absolute: 0.4 10*3/uL (ref 0.0–0.7)
Eosinophils Relative: 4 %
HEMATOCRIT: 37.8 % — AB (ref 39.0–52.0)
HEMOGLOBIN: 13.1 g/dL (ref 13.0–17.0)
LYMPHS ABS: 2.4 10*3/uL (ref 0.7–4.0)
Lymphocytes Relative: 25 %
MCH: 32.6 pg (ref 26.0–34.0)
MCHC: 34.7 g/dL (ref 30.0–36.0)
MCV: 94 fL (ref 78.0–100.0)
MONOS PCT: 8 %
Monocytes Absolute: 0.8 10*3/uL (ref 0.1–1.0)
NEUTROS ABS: 5.9 10*3/uL (ref 1.7–7.7)
NEUTROS PCT: 63 %
Platelets: 255 10*3/uL (ref 150–400)
RBC: 4.02 MIL/uL — AB (ref 4.22–5.81)
RDW: 13.6 % (ref 11.5–15.5)
WBC: 9.5 10*3/uL (ref 4.0–10.5)

## 2015-11-08 MED ORDER — VANCOMYCIN HCL IN DEXTROSE 1-5 GM/200ML-% IV SOLN
1000.0000 mg | Freq: Once | INTRAVENOUS | Status: AC
  Administered 2015-11-08: 1000 mg via INTRAVENOUS
  Filled 2015-11-08: qty 200

## 2015-11-08 MED ORDER — SULFAMETHOXAZOLE-TRIMETHOPRIM 800-160 MG PO TABS: 1.0000 | ORAL_TABLET | Freq: Two times a day (BID) | ORAL | 0 refills | Status: AC

## 2015-11-08 NOTE — Discharge Instructions (Signed)
Follow back here Saturday morning for recheck. Return sooner if infection gets worse

## 2015-11-08 NOTE — ED Provider Notes (Signed)
[facility] DEPT Provider Note   CSN: [id number] Arrival date & time: 11/08/15  [id number]     History   Chief Complaint Chief Complaint  Patient presents with  . Wound Infection    HPI Alexander Nunez is a 70 y.o. male.  Patient complains of swelling tenderness to left lower leg. He has been on Augmentin for 4 days and states it seems be getting worse    Rash   This is a recurrent problem. The current episode started more than 2 days ago. The problem has been gradually worsening. The problem is associated with nothing. There has been no fever. The rash is present on the left lower leg. The pain is at a severity of 3/10. The pain is moderate. The pain has been constant since onset. Pertinent negatives include no blisters. He has tried a cold compress (Augmentin) for the symptoms. The treatment provided no relief. Risk factors: Nothing.    Past Medical History:  Diagnosis Date  . Anxiety   . Back pain, chronic   . Colon polyp   . GERD (gastroesophageal reflux disease)   . Heart palpitations   . High cholesterol   . Hypertension   . Irritable bowel syndrome   . Osteoporosis   . Psoriasis     Patient Active Problem List   Diagnosis Date Noted  . Atypical chest pain 05/04/2014  . Sleep apnea 05/04/2014  . Abdominal bloating 07/22/2013  . Spermatocele 05/02/2013  . Chronic prostatitis 01/31/2013  . Pain in testicle 01/31/2013  . Weakness of left leg 08/12/2012  . Generalized abdominal pain 09/02/2011  . Lumbar canal stenosis 05/28/2011  . Degenerative arthritis of lumbar spine 05/28/2011  . SPL (spondylolisthesis) 05/28/2011  . PERSONAL HX COLONIC POLYPS 04/19/2009  . IRRITABLE BOWEL SYNDROME 04/06/2008  . ABDOMINAL BLOATING 03/07/2008  . CHANGE IN BOWELS 03/07/2008  . ABDOMINAL PAIN-MULTIPLE SITES 03/07/2008  . Essential hypertension 03/06/2008  . GERD 03/06/2008  . GASTRITIS 03/06/2008  . CHRONIC RESPIRATORY DISEASE ARISE PERINTL PERIOD 03/06/2008  .  HYPERLIPIDEMIA 02/06/2003    Past Surgical History:  Procedure Laterality Date  . BACK SURGERY  2015  . CHOLECYSTECTOMY N/A 10/12/2015   Procedure: LAPAROSCOPIC CHOLECYSTECTOMY;  Surgeon: Aviva Signs, MD;  Location: [facility];  Service: General;  Laterality: N/A;  . FOOT FRACTURE SURGERY     left foot  . KNEE ARTHROSCOPY     left       Home Medications    Prior to Admission medications   Medication Sig Start Date End Date Taking? Authorizing Provider  ALPRAZolam Duanne Moron) 0.5 MG tablet Take 0.25-0.5 mg by mouth at bedtime as needed for sleep.    Yes Historical Provider, MD  amoxicillin-clavulanate (AUGMENTIN) 875-125 MG tablet Take 1 tablet by mouth 2 (two) times daily. 10 day course starting on 11/03/2015 11/03/15  Yes Historical Provider, MD  aspirin EC 81 MG tablet Take 81 mg by mouth daily.   Yes Historical Provider, MD  doxazosin (CARDURA) 4 MG tablet Take 4 mg by mouth at bedtime.     Yes Historical Provider, MD  folic acid (FOLVITE) 1 MG tablet Take 1 mg by mouth every evening.    Yes Historical Provider, MD  lisinopril (PRINIVIL,ZESTRIL) 10 MG tablet Take 10 mg by mouth every evening.    Yes Historical Provider, MD  methotrexate (RHEUMATREX) 2.5 MG tablet Take 5 mg by mouth once a week. Take 2 tab weekly on Wednesday Caution:Chemotherapy. Protect from light.   Yes Historical Provider, MD  metoprolol  tartrate (LOPRESSOR) 25 MG tablet Take 1 tablet (25 mg total) by mouth 2 (two) times daily. 05/05/14  Yes Lezlie Octave Black, NP  mupirocin ointment (BACTROBAN) 2 % Apply 1 application topically 2 (two) times daily. Starting on 11/02/2015 11/02/15  Yes Historical Provider, MD  oxyCODONE-acetaminophen (PERCOCET/ROXICET) 5-325 MG tablet Take 1-2 tablets by mouth every 4 (four) hours as needed for moderate pain or severe pain. 10/12/15  Yes Aviva Signs, MD  pantoprazole (PROTONIX) 40 MG tablet Take 1 tablet (40 mg total) by mouth 2 (two) times daily. Patient taking differently: Take 40 mg by mouth  every morning.  10/07/11  Yes Ladene Artist, MD  pravastatin (PRAVACHOL) 20 MG tablet Take 20 mg by mouth every evening.    Yes Historical Provider, MD  Probiotic Product (PROBIOTIC PO) Take 1 capsule by mouth daily.   Yes Historical Provider, MD  HYDROcodone-acetaminophen (NORCO/VICODIN) 5-325 MG tablet Take 2 tablets by mouth every 4 (four) hours as needed. Patient not taking: Reported on 11/08/2015 11/03/15   Fransico Meadow, PA-C  sulfamethoxazole-trimethoprim (BACTRIM DS,SEPTRA DS) 800-160 MG tablet Take 1 tablet by mouth 2 (two) times daily. 11/08/15 11/15/15  Milton Ferguson, MD    Family History Family History  Problem Relation Age of Onset  . Arrhythmia Mother     Atrial fib  . Arrhythmia Sister     Atrial fib  . Stroke Sister   . Cancer Father     Bladder, Deceased   . Cancer Brother   . Colon cancer Neg Hx     Social History Social History  Substance Use Topics  . Smoking status: Former Smoker    Packs/day: 2.00    Types: Cigarettes    Start date: 05/22/1962    Quit date: 05/21/1973  . Smokeless tobacco: Never Used     Comment: Quit 25 yrs now   . Alcohol use 8.4 oz/week    14 Shots of liquor per week     Comment: Rare      Allergies   Cephalexin and Contrast media [iodinated diagnostic agents]   Review of Systems Review of Systems  Constitutional: Negative for appetite change and fatigue.  HENT: Negative for congestion, ear discharge and sinus pressure.   Eyes: Negative for discharge.  Respiratory: Negative for cough.   Cardiovascular: Negative for chest pain.  Gastrointestinal: Negative for abdominal pain and diarrhea.  Genitourinary: Negative for frequency and hematuria.  Musculoskeletal: Negative for back pain.  Skin: Positive for rash.  Neurological: Negative for seizures and headaches.  Psychiatric/Behavioral: Negative for hallucinations.     Physical Exam Updated Vital Signs BP 132/67 (BP Location: Left Arm)   Pulse 71   Temp 98.2 F (36.8 C)  (Oral)   Resp 18   Ht 5\' 11"  (1.803 m)   Wt 276 lb (125.2 kg)   SpO2 97%   BMI 38.49 kg/m   Physical Exam  Constitutional: He is oriented to person, place, and time. He appears well-developed.  HENT:  Head: Normocephalic.  Eyes: Conjunctivae and EOM are normal. No scleral icterus.  Neck: Neck supple. No thyromegaly present.  Cardiovascular: Normal rate and regular rhythm.  Exam reveals no gallop and no friction rub.   No murmur heard. Pulmonary/Chest: No stridor. He has no wheezes. He has no rales. He exhibits no tenderness.  Abdominal: He exhibits no distension. There is no tenderness. There is no rebound.  Musculoskeletal: Normal range of motion. He exhibits no edema.  Lymphadenopathy:    He has no cervical  adenopathy.  Neurological: He is oriented to person, place, and time. He exhibits normal muscle tone. Coordination normal.  Skin: No rash noted. There is erythema.  Patient has cellulitis of left lower  Psychiatric: He has a normal mood and affect. His behavior is normal.     ED Treatments / Results  Labs (all labs ordered are listed, but only abnormal results are displayed) Labs Reviewed  CBC WITH DIFFERENTIAL/PLATELET - Abnormal; Notable for the following:       Result Value   RBC 4.02 (*)    HCT 37.8 (*)    All other components within normal limits  COMPREHENSIVE METABOLIC PANEL - Abnormal; Notable for the following:    CO2 21 (*)    Creatinine, Ser 1.32 (*)    GFR calc non Af Amer 53 (*)    All other components within normal limits    EKG  EKG Interpretation None     Patient has cellulitis left lower leg. He has been on Augmentin for 4 days. Patient was given vancomycin here and put on Bactrim and he will return on Saturday for recheck Radiology No results found.  Procedures Procedures (including critical care time)  Medications Ordered in ED Medications  vancomycin (VANCOCIN) IVPB 1000 mg/200 mL premix (0 mg Intravenous Stopped 11/08/15 1835)      Initial Impression / Assessment and Plan / ED Course  I have reviewed the triage vital signs and the nursing notes.  Pertinent labs & imaging results that were available during my care of the patient were reviewed by me and considered in my medical decision making (see chart for details).  Clinical Course      Final Clinical Impressions(s) / ED Diagnoses   Final diagnoses:  Cellulitis of left lower extremity    New Prescriptions New Prescriptions   SULFAMETHOXAZOLE-TRIMETHOPRIM (BACTRIM DS,SEPTRA DS) 800-160 MG TABLET    Take 1 tablet by mouth 2 (two) times daily.     Milton Ferguson, MD 11/08/15 [phone]

## 2015-11-08 NOTE — ED Notes (Signed)
MD at bedside. 

## 2015-11-08 NOTE — ED Triage Notes (Signed)
Pt reports he fell last Wednesday and has wound to left leg.  Saw his pcp and was put on augmentin Sunday.  Pt says redness is getting worse.

## 2015-11-08 NOTE — ED Notes (Signed)
Pt states he fell last Wednesday, 10/31/15, and saw his PCP on 11/01/15. Pt was given an ointment to but on top of his wound. Pt has a leg would on his left schin.

## 2015-11-10 ENCOUNTER — Emergency Department (HOSPITAL_COMMUNITY)
Admission: EM | Admit: 2015-11-10 | Discharge: 2015-11-10 | Disposition: A | Discharge status: Home / Self Care | Payer: Medicare Other | Attending: Emergency Medicine | Admitting: Emergency Medicine

## 2015-11-10 ENCOUNTER — Encounter (HOSPITAL_COMMUNITY): Payer: Self-pay

## 2015-11-10 DIAGNOSIS — Z7982 Long term (current) use of aspirin: Secondary | ICD-10-CM | POA: Insufficient documentation

## 2015-11-10 DIAGNOSIS — I10 Essential (primary) hypertension: Secondary | ICD-10-CM | POA: Diagnosis not present

## 2015-11-10 DIAGNOSIS — Z79899 Other long term (current) drug therapy: Secondary | ICD-10-CM | POA: Diagnosis not present

## 2015-11-10 DIAGNOSIS — Z87891 Personal history of nicotine dependence: Secondary | ICD-10-CM | POA: Diagnosis not present

## 2015-11-10 DIAGNOSIS — Z4800 Encounter for change or removal of nonsurgical wound dressing: Secondary | ICD-10-CM | POA: Diagnosis present

## 2015-11-10 DIAGNOSIS — L03116 Cellulitis of left lower limb: Secondary | ICD-10-CM | POA: Insufficient documentation

## 2015-11-10 MED ORDER — VANCOMYCIN HCL IN DEXTROSE 1-5 GM/200ML-% IV SOLN
1000.0000 mg | Freq: Once | INTRAVENOUS | Status: AC
  Administered 2015-11-10: 1000 mg via INTRAVENOUS
  Filled 2015-11-10: qty 200

## 2015-11-10 MED ORDER — VANCOMYCIN HCL IN DEXTROSE 1-5 GM/200ML-% IV SOLN
INTRAVENOUS | Status: AC
  Filled 2015-11-10: qty 200

## 2015-11-10 NOTE — ED Provider Notes (Signed)
[facility] DEPT Provider Note   CSN: [id number] Arrival date & time: 11/10/15  0721     History   Chief Complaint Chief Complaint  Patient presents with  . Follow-up    HPI Alexander Nunez is a 70 y.o. male.  Patient here for recheck of cellulitis to left leg. States it seems to be improving    Rash   This is a chronic problem. The current episode started more than 2 days ago. The problem has been gradually improving. The problem is associated with nothing. There has been no fever. The rash is present on the left lower leg. The pain is at a severity of 2/10. The pain is mild. The pain has been constant since onset. Associated symptoms include blisters. Treatments tried: Antibiotics. The treatment provided moderate relief.    Past Medical History:  Diagnosis Date  . Anxiety   . Back pain, chronic   . Colon polyp   . GERD (gastroesophageal reflux disease)   . Heart palpitations   . High cholesterol   . Hypertension   . Irritable bowel syndrome   . Osteoporosis   . Psoriasis     Patient Active Problem List   Diagnosis Date Noted  . Atypical chest pain 05/04/2014  . Sleep apnea 05/04/2014  . Abdominal bloating 07/22/2013  . Spermatocele 05/02/2013  . Chronic prostatitis 01/31/2013  . Pain in testicle 01/31/2013  . Weakness of left leg 08/12/2012  . Generalized abdominal pain 09/02/2011  . Lumbar canal stenosis 05/28/2011  . Degenerative arthritis of lumbar spine 05/28/2011  . SPL (spondylolisthesis) 05/28/2011  . PERSONAL HX COLONIC POLYPS 04/19/2009  . IRRITABLE BOWEL SYNDROME 04/06/2008  . ABDOMINAL BLOATING 03/07/2008  . CHANGE IN BOWELS 03/07/2008  . ABDOMINAL PAIN-MULTIPLE SITES 03/07/2008  . Essential hypertension 03/06/2008  . GERD 03/06/2008  . GASTRITIS 03/06/2008  . CHRONIC RESPIRATORY DISEASE ARISE PERINTL PERIOD 03/06/2008  . HYPERLIPIDEMIA 02/06/2003    Past Surgical History:  Procedure Laterality Date  . BACK SURGERY  2015  .  CHOLECYSTECTOMY N/A 10/12/2015   Procedure: LAPAROSCOPIC CHOLECYSTECTOMY;  Surgeon: Aviva Signs, MD;  Location: [facility] ORS;  Service: General;  Laterality: N/A;  . FOOT FRACTURE SURGERY     left foot  . KNEE ARTHROSCOPY     left       Home Medications    Prior to Admission medications   Medication Sig Start Date End Date Taking? Authorizing Provider  ALPRAZolam Duanne Moron) 0.5 MG tablet Take 0.25-0.5 mg by mouth at bedtime as needed for sleep.     Historical Provider, MD  amoxicillin-clavulanate (AUGMENTIN) 875-125 MG tablet Take 1 tablet by mouth 2 (two) times daily. 10 day course starting on 11/03/2015 11/03/15   Historical Provider, MD  aspirin EC 81 MG tablet Take 81 mg by mouth daily.    Historical Provider, MD  doxazosin (CARDURA) 4 MG tablet Take 4 mg by mouth at bedtime.      Historical Provider, MD  folic acid (FOLVITE) 1 MG tablet Take 1 mg by mouth every evening.     Historical Provider, MD  HYDROcodone-acetaminophen (NORCO/VICODIN) 5-325 MG tablet Take 2 tablets by mouth every 4 (four) hours as needed. Patient not taking: Reported on 11/08/2015 11/03/15   Fransico Meadow, PA-C  lisinopril (PRINIVIL,ZESTRIL) 10 MG tablet Take 10 mg by mouth every evening.     Historical Provider, MD  methotrexate (RHEUMATREX) 2.5 MG tablet Take 5 mg by mouth once a week. Take 2 tab weekly on Wednesday Caution:Chemotherapy. Protect from  light.    Historical Provider, MD  metoprolol tartrate (LOPRESSOR) 25 MG tablet Take 1 tablet (25 mg total) by mouth 2 (two) times daily. 05/05/14   Radene Gunning, NP  mupirocin ointment (BACTROBAN) 2 % Apply 1 application topically 2 (two) times daily. Starting on 11/02/2015 11/02/15   Historical Provider, MD  oxyCODONE-acetaminophen (PERCOCET/ROXICET) 5-325 MG tablet Take 1-2 tablets by mouth every 4 (four) hours as needed for moderate pain or severe pain. 10/12/15   Aviva Signs, MD  pantoprazole (PROTONIX) 40 MG tablet Take 1 tablet (40 mg total) by mouth 2 (two) times  daily. Patient taking differently: Take 40 mg by mouth every morning.  10/07/11   Ladene Artist, MD  pravastatin (PRAVACHOL) 20 MG tablet Take 20 mg by mouth every evening.     Historical Provider, MD  Probiotic Product (PROBIOTIC PO) Take 1 capsule by mouth daily.    Historical Provider, MD  sulfamethoxazole-trimethoprim (BACTRIM DS,SEPTRA DS) 800-160 MG tablet Take 1 tablet by mouth 2 (two) times daily. 11/08/15 11/15/15  Milton Ferguson, MD    Family History Family History  Problem Relation Age of Onset  . Arrhythmia Mother     Atrial fib  . Arrhythmia Sister     Atrial fib  . Stroke Sister   . Cancer Father     Bladder, Deceased   . Cancer Brother   . Colon cancer Neg Hx     Social History Social History  Substance Use Topics  . Smoking status: Former Smoker    Packs/day: 2.00    Types: Cigarettes    Start date: 05/22/1962    Quit date: 05/21/1973  . Smokeless tobacco: Never Used     Comment: Quit 25 yrs now   . Alcohol use 8.4 oz/week    14 Shots of liquor per week     Comment: Rare      Allergies   Cephalexin and Contrast media [iodinated diagnostic agents]   Review of Systems Review of Systems  Constitutional: Negative for appetite change and fatigue.  HENT: Negative for congestion, ear discharge and sinus pressure.   Eyes: Negative for discharge.  Respiratory: Negative for cough.   Cardiovascular: Negative for chest pain.  Gastrointestinal: Negative for abdominal pain and diarrhea.  Genitourinary: Negative for frequency and hematuria.  Musculoskeletal: Negative for back pain.  Skin: Positive for rash.  Neurological: Negative for seizures and headaches.  Psychiatric/Behavioral: Negative for hallucinations.     Physical Exam Updated Vital Signs BP 111/58   Pulse (!) 50   Temp 97.5 F (36.4 C) (Oral)   Resp 16   Ht 5\' 11"  (1.803 m)   Wt 266 lb (120.7 kg)   SpO2 95%   BMI 37.10 kg/m   Physical Exam  Constitutional: He is oriented to person, place,  and time. He appears well-developed.  HENT:  Head: Normocephalic.  Eyes: Conjunctivae are normal.  Neck: No tracheal deviation present.  Cardiovascular:  No murmur heard. Musculoskeletal: Normal range of motion.  Neurological: He is oriented to person, place, and time.  Skin: Skin is warm. There is erythema.  Cellulitis to left lower leg seems to be improving.  Psychiatric: He has a normal mood and affect.     ED Treatments / Results  Labs (all labs ordered are listed, but only abnormal results are displayed) Labs Reviewed - No data to display  EKG  EKG Interpretation None       Radiology No results found.  Procedures Procedures (including critical care time)  Medications Ordered in ED Medications  vancomycin (VANCOCIN) IVPB 1000 mg/200 mL premix (1,000 mg Intravenous New Bag/Given 11/10/15 0800)  vancomycin (VANCOCIN) 1-5 GM/200ML-% IVPB (  Return to [facility] 11/10/15 0759)     Initial Impression / Assessment and Plan / ED Course  I have reviewed the triage vital signs and the nursing notes.  Pertinent labs & imaging results that were available during my care of the patient were reviewed by me and considered in my medical decision making (see chart for details).  Clinical Course     Cellulitis to left lower leg improving. Patient given another gram of vancomycin. He will continue the Bactrim and Augmentin and will follow-up with his doctor and to 3 days  Final Clinical Impressions(s) / ED Diagnoses   Final diagnoses:  Cellulitis of left lower extremity    New Prescriptions New Prescriptions   No medications on file     Milton Ferguson, MD 11/10/15 [phone]

## 2015-11-10 NOTE — Discharge Instructions (Signed)
Follow-up with Dr. Riley Kill by Wednesday next week. Return if problems

## 2015-11-10 NOTE — ED Triage Notes (Addendum)
Pt in for recheck of cellulitis to  lower leg due to fall. Pt reports only painful with standing. Taking antibiotics as prescribed

## 2015-11-12 ENCOUNTER — Ambulatory Visit (HOSPITAL_COMMUNITY): Payer: Medicare Other | Attending: Family Medicine | Admitting: Physical Therapy

## 2015-11-12 DIAGNOSIS — L089 Local infection of the skin and subcutaneous tissue, unspecified: Secondary | ICD-10-CM | POA: Diagnosis not present

## 2015-11-12 DIAGNOSIS — Z1389 Encounter for screening for other disorder: Secondary | ICD-10-CM | POA: Diagnosis not present

## 2015-11-12 DIAGNOSIS — Z6839 Body mass index (BMI) 39.0-39.9, adult: Secondary | ICD-10-CM | POA: Diagnosis not present

## 2015-11-12 DIAGNOSIS — X58XXXS Exposure to other specified factors, sequela: Secondary | ICD-10-CM | POA: Insufficient documentation

## 2015-11-12 DIAGNOSIS — M79662 Pain in left lower leg: Secondary | ICD-10-CM | POA: Diagnosis not present

## 2015-11-12 DIAGNOSIS — S81839S Puncture wound without foreign body, unspecified lower leg, sequela: Secondary | ICD-10-CM

## 2015-11-12 NOTE — Therapy (Signed)
[place], [place], [address] Phone: [phone]   Fax:  [phone]  Wound Care Evaluation  Patient Details  Name: WALBERTO FITTON MRN: [id number] Date of Birth: 07/12/1945 Referring Provider: [employer]  Encounter Date: 11/12/2015      PT End of Session - 11/12/15 1224    Visit Number 1   Number of Visits 8   Date for PT Re-Evaluation 12/12/15   Authorization Type medicare   Authorization - Visit Number 1   Authorization - Number of Visits 8   PT Start Time [id number]   PT Stop Time 1110   PT Time Calculation (min) 35 min   Activity Tolerance Patient tolerated treatment well   Behavior During Therapy [facility] for tasks assessed/performed      Past Medical History:  Diagnosis Date  . Anxiety   . Back pain, chronic   . Colon polyp   . GERD (gastroesophageal reflux disease)   . Heart palpitations   . High cholesterol   . Hypertension   . Irritable bowel syndrome   . Osteoporosis   . Psoriasis     Past Surgical History:  Procedure Laterality Date  . BACK SURGERY  2015  . CHOLECYSTECTOMY N/A 10/12/2015   Procedure: LAPAROSCOPIC CHOLECYSTECTOMY;  Surgeon: Aviva Signs, MD;  Location: [facility];  Service: General;  Laterality: N/A;  . FOOT FRACTURE SURGERY     left foot  . KNEE ARTHROSCOPY     left    There were no vitals filed for this visit.        [facility] PT Assessment - 11/12/15 0001      Assessment   Medical Diagnosis Non healing wound Lt LE    Referring Provider [employer]   Onset Date/Surgical Date 11/01/15   Next MD Visit not scheduled    Prior Therapy ER x 2; MD visit      Precautions   Precautions None     Restrictions   Weight Bearing Restrictions No     Balance Screen   Has the patient fallen in the past 6 months Yes   How many times? 1   Has the patient had a decrease in activity level because of a fear of falling?  Yes   Is the patient reluctant to leave their home because of  a fear of falling?  No     Cognition   Overall Cognitive Status Within Functional Limits for tasks assessed         Wound Therapy - 11/12/15 1115    Subjective Pt states that he fell on 11/01/2015 and hit his left leg on something.   He states that there was very little blood but a "knot the size of a baseball" appeared.  The pt was given antibiotics and told to put an antibiotic ointment on his wound but he continued to have increased swelling and pain.  He has been to the ER twice as well as his MD and is now being referred for skilled wound care by physical therapy    Patient and Family Stated Goals wound to heal    Date of Onset 11/01/15   Prior Treatments antibiotic ointment and antibiotic    Pain Assessment 0-10   Pain Score 5    Pain Location Leg   Pain Orientation Left   Pain Descriptors / Indicators Burning;Throbbing   Pain Onset On-going   Patients Stated Pain Goal 0   Multiple Pain Sites No  Evaluation and Treatment Procedures Explained to Patient/Family Yes   Evaluation and Treatment Procedures agreed to   Wound Properties Date First Assessed: 11/12/15 Time First Assessed: [id number] Wound Type: Laceration   Dressing Type Non adherent   Dressing Changed Changed   Dressing Status Clean;Dry   Dressing Change Frequency PRN   Site / Wound Assessment Black;Brown;Granulation tissue   % Wound base Red or Granulating 20%   % Wound base Yellow 10%   % Wound base Black 70%   Peri-wound Assessment Edema;Erythema (blanchable)   Wound Length (cm) 1.5 cm   Wound Width (cm) 4 cm   Wound Depth (cm) --  unknown at this time    Drainage Amount Scant   Drainage Description Sanguineous   Treatment Cleansed;Debridement (Selective);Other (Comment)   Selective Debridement - Location wound site   Selective Debridement - Tools Used Forceps;Scalpel   Selective Debridement - Tissue Removed eschar    Wound Therapy - Clinical Statement Mr. Parkman is a 70 yo male who fell injuring his left leg on  11/01/2015.  The leg became infected and despite oral and topical antibiotic the wound continues not to heal.  He went to his primary physican today who requests that he is seen by physical therapy for wound care.  Examination demonstrates local swelling, slight increased heat with black eschar covering 75% of the wound.  Mr. Markell will benefit from skilled physical therapy for debridement of the wound and to  promote a healing environment    Wound Therapy - Functional Problem List The more Mr. Whack walks the more he has pain    Factors Delaying/Impairing Wound Healing Infection - systemic/local;Polypharmacy   Hydrotherapy Plan Debridement;Dressing change;Patient/family education   Wound Therapy - Frequency Other (comment)   Wound Therapy - Current Recommendations --  2 x a week  for 4 weeks    Wound Plan debridement and dressing change using compression dressing    Dressing  medihoney, 2x2, kerlix, coban and netting                          PT Education - 11/12/15 1223    Education provided Yes   Education Details Do not get dressing wet. If possible keep dressing on until next visit, if dressing becomes painful remove    Person(s) Educated Patient   Methods Explanation   Comprehension Verbalized understanding          PT Short Term Goals - 11/12/15 1226      PT SHORT TERM GOAL #1   Title Pt to verbalize signs and symptoms of cellulitis and the importance of seeking medical assistance if it occurs   Time 2   Period Weeks   Status New     PT SHORT TERM GOAL #2   Title Pt wound to be 75% granulated to reduce risk of infection    Time 2   Period Weeks   Status New     PT SHORT TERM GOAL #3   Title Pt swelling to be decreased to allow shoes to fit normally    Time 2   Period Weeks           PT Long Term Goals - 11/12/15 1227      PT LONG TERM GOAL #1   Title Wound to be healed    Time 4   Period Weeks   Status New              Plan -  2015-12-04 1225    Clinical Impression Statement see above    Rehab Potential Good   PT Frequency 2x / week   PT Duration 4 weeks   PT Treatment/Interventions ADLs/Self Care Home Management;Patient/family education;Other (comment)  debridement    PT Next Visit Plan continue with debridement, dressing change    Consulted and Agree with Plan of Care Patient      Patient will benefit from skilled therapeutic intervention in order to improve the following deficits and impairments:  Difficulty walking, Increased edema, Pain  Visit Diagnosis: Pain in left lower leg  Puncture wound of lower leg with complication, sequela      G-Codes - 2015/12/04 1234    Functional Assessment Tool Used clinical judgement:  granulation of wound    Functional Limitation Other PT primary   Other PT Primary Current Status [id number]) At least 60 percent but less than 80 percent impaired, limited or restricted   Other PT Primary Goal Status [id number]) At least 1 percent but less than 20 percent impaired, limited or restricted      Problem List Patient Active Problem List   Diagnosis Date Noted  . Atypical chest pain 05/04/2014  . Sleep apnea 05/04/2014  . Abdominal bloating 07/22/2013  . Spermatocele 05/02/2013  . Chronic prostatitis 01/31/2013  . Pain in testicle 01/31/2013  . Weakness of left leg 08/12/2012  . Generalized abdominal pain 09/02/2011  . Lumbar canal stenosis 05/28/2011  . Degenerative arthritis of lumbar spine 05/28/2011  . SPL (spondylolisthesis) 05/28/2011  . PERSONAL HX COLONIC POLYPS 04/19/2009  . IRRITABLE BOWEL SYNDROME 04/06/2008  . ABDOMINAL BLOATING 03/07/2008  . CHANGE IN BOWELS 03/07/2008  . ABDOMINAL PAIN-MULTIPLE SITES 03/07/2008  . Essential hypertension 03/06/2008  . GERD 03/06/2008  . GASTRITIS 03/06/2008  . CHRONIC RESPIRATORY DISEASE ARISE PERINTL PERIOD 03/06/2008  . HYPERLIPIDEMIA 02/06/2003   Rayetta Humphrey, PT CLT [phone] 2015-12-04, 12:35 PM  [facility] [address] [place], [place], [address] Phone: [phone]   Fax:  [phone]  Name: DARQUAN HILES MRN: [id number] Date of Birth: 28-Jan-1946

## 2015-11-14 ENCOUNTER — Ambulatory Visit (HOSPITAL_COMMUNITY): Payer: Medicare Other | Admitting: Physical Therapy

## 2015-11-14 DIAGNOSIS — M79662 Pain in left lower leg: Secondary | ICD-10-CM

## 2015-11-14 DIAGNOSIS — S81839S Puncture wound without foreign body, unspecified lower leg, sequela: Secondary | ICD-10-CM | POA: Diagnosis not present

## 2015-11-14 NOTE — Therapy (Addendum)
[place] Nune[place], [place], [address] Phone: [phone]   Fax:  [phone]  Wound Care Therapy  Patient Details  Name: Alexander Nunez MRN: [id number] Date of Birth: May 22, 1945 Referring Provider: [employer]  Encounter Date: 11/14/2015      PT End of Session - 11/14/15 1034    Visit Number 2   Number of Visits 8   Date for PT Re-Evaluation 12/12/15   Authorization Type medicare   Authorization - Visit Number 2   Authorization - Number of Visits 8   PT Start Time 0950   PT Stop Time 1025   PT Time Calculation (min) 35 min   Activity Tolerance Patient tolerated treatment well   Behavior During Therapy [facility] for tasks assessed/performed      Past Medical History:  Diagnosis Date  . Anxiety   . Back pain, chronic   . Colon polyp   . GERD (gastroesophageal reflux disease)   . Heart palpitations   . High cholesterol   . Hypertension   . Irritable bowel syndrome   . Osteoporosis   . Psoriasis     Past Surgical History:  Procedure Laterality Date  . BACK SURGERY  2015  . CHOLECYSTECTOMY N/A 10/12/2015   Procedure: LAPAROSCOPIC CHOLECYSTECTOMY;  Surgeon: Aviva Signs, MD;  Location: [facility];  Service: General;  Laterality: N/A;  . FOOT FRACTURE SURGERY     left foot  . KNEE ARTHROSCOPY     left    There were no vitals filed for this visit.                  Wound Therapy - 11/14/15 1028    Subjective Pt states that the bandage started burning yesterday evening sot he took the dressing off.  Pt comes to department witn no dressing on his leg. left leg on something.   He states that there was very little blood but a "knot the size of a baseball" appeared.  The pt was given antibiotics and told to put an antibiotic ointment on his wound but he continued to have increased swelling and pain.  He has been to the ER twice as well as his MD and is now being referred for skilled wound care by physical  therapy    Patient and Family Stated Goals wound to heal    Date of Onset 11/01/15   Prior Treatments antibiotic ointment and antibiotic    Pain Assessment 0-10   Pain Score 2    Pain Location Leg   Pain Orientation Left   Pain Descriptors / Indicators Aching;Burning   Pain Onset On-going   Multiple Pain Sites No   Evaluation and Treatment Procedures Explained to Patient/Family Yes   Evaluation and Treatment Procedures agreed to   Wound Properties Date First Assessed: 11/12/15 Time First Assessed: [id number] Wound Type: Laceration   Dressing Type --  no dressing upon entering department   Dressing Status --   Dressing Change Frequency PRN   Site / Wound Assessment Black;Brown;Granulation tissue   % Wound base Red or Granulating 10%   % Wound base Yellow 30%   % Wound base Black 60%   Peri-wound Assessment Edema;Erythema (blanchable)   Drainage Amount Scant   Drainage Description Sanguineous   Treatment Debridement (Selective);Hydrotherapy (Pulse lavage)   Pulsed lavage therapy - wound location entire wound   Pulsed Lavage with Suction (psi) 4 psi   Pulsed Lavage with Suction - Normal Saline Used 1000 mL  Pulsed Lavage Tip Tip with splash shield   Selective Debridement - Location wound site   Selective Debridement - Tools Used Forceps;Scalpel   Selective Debridement - Tissue Removed eschar    Wound Therapy - Clinical Statement Wound red halo has decreased in size.  Pt intolerant to mechanical debridement therefore completed pulse lavage to attempt to loosen eschar prior to debridement.   Burning sensation is most likely due to intolerance to coban therefore therapist placed layer of netting between kerlix and coban,.    Wound Therapy - Functional Problem List The more Alexander Nunez walks the more he has pain    Factors Delaying/Impairing Wound Healing Infection - systemic/local;Polypharmacy   Hydrotherapy Plan Debridement;Dressing change;Patient/family education   Wound Therapy - Frequency  Other (comment)   Wound Therapy - Current Recommendations --  2 x a week  for 4 weeks    Wound Plan debridement and dressing change using compression dressing    Dressing  medihoney, 2x2, kerlix,netting. coban and netting                    PT Short Term Goals - 11/14/15 1035      PT SHORT TERM GOAL #1   Title Pt to verbalize signs and symptoms of cellulitis and the importance of seeking medical assistance if it occurs   Time 2   Period Weeks   Status Achieved     PT SHORT TERM GOAL #2   Title Pt wound to be 75% granulated to reduce risk of infection    Time 2   Period Weeks   Status On-going     PT SHORT TERM GOAL #3   Title Pt swelling to be decreased to allow shoes to fit normally    Time 2   Period Weeks   Status On-going           PT Long Term Goals - 11/14/15 1035      PT LONG TERM GOAL #1   Title Wound to be healed    Time 4   Period Weeks   Status On-going               Plan - 11/14/15 1034    Clinical Impression Statement see above    Rehab Potential Good   PT Frequency 2x / week   PT Duration 4 weeks   PT Treatment/Interventions ADLs/Self Care Home Management;Patient/family education;Other (comment)  debridement    PT Next Visit Plan continue with pulse lavage,  debridement, dressing change    Consulted and Agree with Plan of Care Patient      Patient will benefit from skilled therapeutic intervention in order to improve the following deficits and impairments:  Difficulty walking, Increased edema, Pain  Visit Diagnosis: Pain in left lower leg  Puncture wound of lower leg with complication, sequela     Problem List Patient Active Problem List   Diagnosis Date Noted  . Atypical chest pain 05/04/2014  . Sleep apnea 05/04/2014  . Abdominal bloating 07/22/2013  . Spermatocele 05/02/2013  . Chronic prostatitis 01/31/2013  . Pain in testicle 01/31/2013  . Weakness of left leg 08/12/2012  . Generalized abdominal pain  09/02/2011  . Lumbar canal stenosis 05/28/2011  . Degenerative arthritis of lumbar spine 05/28/2011  . SPL (spondylolisthesis) 05/28/2011  . PERSONAL HX COLONIC POLYPS 04/19/2009  . IRRITABLE BOWEL SYNDROME 04/06/2008  . ABDOMINAL BLOATING 03/07/2008  . CHANGE IN BOWELS 03/07/2008  . ABDOMINAL PAIN-MULTIPLE SITES 03/07/2008  . Essential hypertension 03/06/2008  .  GERD 03/06/2008  . GASTRITIS 03/06/2008  . CHRONIC RESPIRATORY DISEASE ARISE PERINTL PERIOD 03/06/2008  . HYPERLIPIDEMIA 02/06/2003    Rayetta Humphrey, PT CLT [phone] 11/14/2015, 10:36 AM  [facility] [place], [place], [address] Phone: [phone]   Fax:  [phone]  Name: Alexander Nunez MRN: [id number] Date of Birth: 1945/05/07

## 2015-11-15 ENCOUNTER — Other Ambulatory Visit: Payer: Medicare Other

## 2015-11-15 ENCOUNTER — Ambulatory Visit (HOSPITAL_COMMUNITY): Payer: Medicare Other

## 2015-11-19 ENCOUNTER — Ambulatory Visit (HOSPITAL_COMMUNITY): Payer: Medicare Other | Admitting: Physical Therapy

## 2015-11-19 DIAGNOSIS — M79662 Pain in left lower leg: Secondary | ICD-10-CM | POA: Diagnosis not present

## 2015-11-19 DIAGNOSIS — S81839S Puncture wound without foreign body, unspecified lower leg, sequela: Secondary | ICD-10-CM

## 2015-11-19 NOTE — Therapy (Signed)
[place], [place], [address] Phone: [phone]   Fax:  [phone]  Wound Care Therapy  Patient Details  Name: Alexander Nunez MRN: [id number] Date of Birth: 1945-06-30 Referring Provider: [employer]  Encounter Date: 11/19/2015      PT End of Session - 11/19/15 1209    Visit Number 3   Number of Visits 8   Date for PT Re-Evaluation 12/12/15   Authorization Type medicare   Authorization - Visit Number 3   Authorization - Number of Visits 8   PT Start Time 0910   PT Stop Time 0940   PT Time Calculation (min) 30 min   Activity Tolerance Patient tolerated treatment well   Behavior During Therapy [facility] for tasks assessed/performed      Past Medical History:  Diagnosis Date  . Anxiety   . Back pain, chronic   . Colon polyp   . GERD (gastroesophageal reflux disease)   . Heart palpitations   . High cholesterol   . Hypertension   . Irritable bowel syndrome   . Osteoporosis   . Psoriasis     Past Surgical History:  Procedure Laterality Date  . BACK SURGERY  2015  . CHOLECYSTECTOMY N/A 10/12/2015   Procedure: LAPAROSCOPIC CHOLECYSTECTOMY;  Surgeon: Aviva Signs, MD;  Location: [facility];  Service: General;  Laterality: N/A;  . FOOT FRACTURE SURGERY     left foot  . KNEE ARTHROSCOPY     left    There were no vitals filed for this visit.                  Wound Therapy - 11/19/15 1159    Subjective Pt states his bandages fell down and he had to rewrap it.  Also states it had more drainage.   Patient and Family Stated Goals wound to heal    Date of Onset 11/01/15   Prior Treatments antibiotic ointment and antibiotic    Pain Assessment 0-10   Pain Score 1    Pain Location Leg   Pain Orientation Left   Pain Descriptors / Indicators Aching;Burning   Pain Onset On-going   Multiple Pain Sites No   Evaluation and Treatment Procedures Explained to Patient/Family Yes   Evaluation and  Treatment Procedures agreed to   Wound Properties Date First Assessed: 11/12/15 Time First Assessed: [id number] Wound Type: Laceration   Dressing Type Non adherent  Pt redressed with telfa and bactroban ointment   Dressing Changed Changed   Dressing Status Clean;Dry;Intact   Dressing Change Frequency PRN   Site / Wound Assessment Black;Brown;Granulation tissue   % Wound base Red or Granulating 80%  after debridement   % Wound base Yellow 5%   % Wound base Black 10%   Peri-wound Assessment Edema;Erythema (blanchable)   Drainage Amount Minimal   Drainage Description Sanguineous   Treatment Hydrotherapy (Pulse lavage);Debridement (Selective)   Pulsed lavage therapy - wound location entire wound   Pulsed Lavage with Suction (psi) 4 psi   Pulsed Lavage with Suction - Normal Saline Used 1000 mL   Pulsed Lavage Tip Tip with splash shield   Selective Debridement - Location wound site   Selective Debridement - Tools Used Forceps;Scalpel   Selective Debridement - Tissue Removed eschar    Wound Therapy - Clinical Statement Improvement noted today with continued reduction in erythema.  Able to debride most all slough and eschar with exception of 15% to reveal granulation tissue.  Debrided all wound  borders to increase approximtation.  Pt without any pain during debridment.  continued with medihoney gel and 2X2.  Used extra cotton around ankle to increase cone shape for compression and to keep dressing from sliding down.     Wound Therapy - Functional Problem List The more Mr. Steinhauser walks the more he has pain    Factors Delaying/Impairing Wound Healing Infection - systemic/local;Polypharmacy   Hydrotherapy Plan Debridement;Dressing change;Patient/family education   Wound Therapy - Frequency Other (comment)   Wound Therapy - Current Recommendations --  2 x a week  for 4 weeks    Wound Plan debridement and dressing change using compression dressing.  discontinue pulsed lavage.   Dressing  medihoney, 2x2,  kerlix,netting. coban and netting                    PT Short Term Goals - 11/14/15 1035      PT SHORT TERM GOAL #1   Title Pt to verbalize signs and symptoms of cellulitis and the importance of seeking medical assistance if it occurs   Time 2   Period Weeks   Status Achieved     PT SHORT TERM GOAL #2   Title Pt wound to be 75% granulated to reduce risk of infection    Time 2   Period Weeks   Status On-going     PT SHORT TERM GOAL #3   Title Pt swelling to be decreased to allow shoes to fit normally    Time 2   Period Weeks   Status On-going           PT Long Term Goals - 11/14/15 1035      PT LONG TERM GOAL #1   Title Wound to be healed    Time 4   Period Weeks   Status On-going             Patient will benefit from skilled therapeutic intervention in order to improve the following deficits and impairments:     Visit Diagnosis: Pain in left lower leg  Puncture wound of lower leg with complication, sequela     Problem List Patient Active Problem List   Diagnosis Date Noted  . Atypical chest pain 05/04/2014  . Sleep apnea 05/04/2014  . Abdominal bloating 07/22/2013  . Spermatocele 05/02/2013  . Chronic prostatitis 01/31/2013  . Pain in testicle 01/31/2013  . Weakness of left leg 08/12/2012  . Generalized abdominal pain 09/02/2011  . Lumbar canal stenosis 05/28/2011  . Degenerative arthritis of lumbar spine 05/28/2011  . SPL (spondylolisthesis) 05/28/2011  . PERSONAL HX COLONIC POLYPS 04/19/2009  . IRRITABLE BOWEL SYNDROME 04/06/2008  . ABDOMINAL BLOATING 03/07/2008  . CHANGE IN BOWELS 03/07/2008  . ABDOMINAL PAIN-MULTIPLE SITES 03/07/2008  . Essential hypertension 03/06/2008  . GERD 03/06/2008  . GASTRITIS 03/06/2008  . CHRONIC RESPIRATORY DISEASE ARISE PERINTL PERIOD 03/06/2008  . HYPERLIPIDEMIA 02/06/2003    Teena Irani, PTA/CLT [phone]  11/19/2015, 12:10 PM  [place] [address] [place], [place], [address] Phone: [phone]   Fax:  [phone]  Name: Alexander Nunez MRN: [id number] Date of Birth: 1946-01-18

## 2015-11-20 ENCOUNTER — Other Ambulatory Visit (HOSPITAL_COMMUNITY): Payer: Self-pay | Admitting: Endocrinology

## 2015-11-20 DIAGNOSIS — G4733 Obstructive sleep apnea (adult) (pediatric): Secondary | ICD-10-CM | POA: Diagnosis not present

## 2015-11-20 DIAGNOSIS — K635 Polyp of colon: Secondary | ICD-10-CM | POA: Diagnosis not present

## 2015-11-20 DIAGNOSIS — E784 Other hyperlipidemia: Secondary | ICD-10-CM | POA: Diagnosis not present

## 2015-11-20 DIAGNOSIS — I10 Essential (primary) hypertension: Secondary | ICD-10-CM | POA: Diagnosis not present

## 2015-11-20 DIAGNOSIS — E042 Nontoxic multinodular goiter: Secondary | ICD-10-CM

## 2015-11-20 DIAGNOSIS — Z6837 Body mass index (BMI) 37.0-37.9, adult: Secondary | ICD-10-CM | POA: Diagnosis not present

## 2015-11-21 ENCOUNTER — Ambulatory Visit (HOSPITAL_COMMUNITY)
Admission: RE | Admit: 2015-11-21 | Discharge: 2015-11-21 | Disposition: A | Discharge status: Home / Self Care | Payer: Medicare Other | Source: Ambulatory Visit | Attending: Endocrinology | Admitting: Endocrinology

## 2015-11-21 DIAGNOSIS — E042 Nontoxic multinodular goiter: Secondary | ICD-10-CM | POA: Insufficient documentation

## 2015-11-22 ENCOUNTER — Ambulatory Visit (HOSPITAL_COMMUNITY): Payer: Medicare Other

## 2015-11-22 DIAGNOSIS — S81839S Puncture wound without foreign body, unspecified lower leg, sequela: Secondary | ICD-10-CM

## 2015-11-22 DIAGNOSIS — M79662 Pain in left lower leg: Secondary | ICD-10-CM | POA: Diagnosis not present

## 2015-11-22 NOTE — Therapy (Signed)
[place], [place], [address] Phone: [phone]   Fax:  [phone]  Wound Care Therapy  Patient Details  Name: Alexander Nunez MRN: [id number] Date of Birth: 03-20-1945 Referring Provider: [employer]  Encounter Date: 11/22/2015      PT End of Session - 11/22/15 1006    Visit Number 4   Number of Visits 8   Date for PT Re-Evaluation 12/12/15   Authorization Type medicare   Authorization - Visit Number 4   Authorization - Number of Visits 8   PT Start Time 0907   PT Stop Time 0945   PT Time Calculation (min) 38 min   Activity Tolerance Patient tolerated treatment well   Behavior During Therapy [facility] for tasks assessed/performed      Past Medical History:  Diagnosis Date  . Anxiety   . Back pain, chronic   . Colon polyp   . GERD (gastroesophageal reflux disease)   . Heart palpitations   . High cholesterol   . Hypertension   . Irritable bowel syndrome   . Osteoporosis   . Psoriasis     Past Surgical History:  Procedure Laterality Date  . BACK SURGERY  2015  . CHOLECYSTECTOMY N/A 10/12/2015   Procedure: LAPAROSCOPIC CHOLECYSTECTOMY;  Surgeon: Aviva Signs, MD;  Location: [facility];  Service: General;  Laterality: N/A;  . FOOT FRACTURE SURGERY     left foot  . KNEE ARTHROSCOPY     left    There were no vitals filed for this visit.       Subjective Assessment - 11/22/15 0953    Subjective Pt arrived with dressings intact, no reports of pain    Currently in Pain? No/denies                   Wound Therapy - 11/22/15 0958    Subjective Pt arrived with dressings intact have slid down a little bit, no reports of pain    Patient and Family Stated Goals wound to heal    Date of Onset 11/01/15   Prior Treatments antibiotic ointment and antibiotic    Pain Assessment No/denies pain   Pain Score 0-No pain   Evaluation and Treatment Procedures Explained to Patient/Family Yes   Evaluation  and Treatment Procedures agreed to   Wound Properties Date First Assessed: 11/12/15 Time First Assessed: [id number] Wound Type: Laceration   Dressing Type Gauze (Comment)  medihoney, 2x2, kerlix, cotton, coban and netting    Dressing Changed Changed   Dressing Status Clean;Dry;Intact   Dressing Change Frequency PRN   Site / Wound Assessment Black;Brown;Granulation tissue   % Wound base Red or Granulating 80%   % Wound base Yellow 5%   % Wound base Black 10%   Peri-wound Assessment Edema;Erythema (blanchable)   Drainage Amount Minimal   Drainage Description Sanguineous   Treatment Hydrotherapy (Pulse lavage);Cleansed;Debridement (Selective)   Pulsed lavage therapy - wound location entire wound   Pulsed Lavage with Suction (psi) 4 psi   Pulsed Lavage with Suction - Normal Saline Used 1000 mL   Pulsed Lavage Tip Tip with splash shield   Selective Debridement - Location wound site   Selective Debridement - Tools Used Forceps;Scalpel   Selective Debridement - Tissue Removed eschar    Wound Therapy - Clinical Statement Noted pus and blood under scab with palpation/pressure.  Selective debridement for removal of eschar.  Continued wiht PLS for removal of puss from opening.  Continued with medihoney,  2x2 with extra ABD pad applied for drainage then kerlix, cotton and coban for edema control.  No reports of pain through session, did report a slight burning sensation following initial application of honey.     Wound Therapy - Functional Problem List The more Mr. Roderick walks the more he has pain    Factors Delaying/Impairing Wound Healing Infection - systemic/local;Polypharmacy   Hydrotherapy Plan Debridement;Dressing change;Patient/family education   Wound Therapy - Frequency Other (comment)  2x/wk for 4 weeks   Wound Plan debridement and dressing change using compression dressing.  discontinue pulsed lavage.   Dressing  medihoney, 2x2, kerlix,netting. coban and netting                     PT Short Term Goals - 11/14/15 1035      PT SHORT TERM GOAL #1   Title Pt to verbalize signs and symptoms of cellulitis and the importance of seeking medical assistance if it occurs   Time 2   Period Weeks   Status Achieved     PT SHORT TERM GOAL #2   Title Pt wound to be 75% granulated to reduce risk of infection    Time 2   Period Weeks   Status On-going     PT SHORT TERM GOAL #3   Title Pt swelling to be decreased to allow shoes to fit normally    Time 2   Period Weeks   Status On-going           PT Long Term Goals - 11/14/15 1035      PT LONG TERM GOAL #1   Title Wound to be healed    Time 4   Period Weeks   Status On-going             Patient will benefit from skilled therapeutic intervention in order to improve the following deficits and impairments:     Visit Diagnosis: Pain in left lower leg  Puncture wound of lower leg with complication, sequela     Problem List Patient Active Problem List   Diagnosis Date Noted  . Atypical chest pain 05/04/2014  . Sleep apnea 05/04/2014  . Abdominal bloating 07/22/2013  . Spermatocele 05/02/2013  . Chronic prostatitis 01/31/2013  . Pain in testicle 01/31/2013  . Weakness of left leg 08/12/2012  . Generalized abdominal pain 09/02/2011  . Lumbar canal stenosis 05/28/2011  . Degenerative arthritis of lumbar spine 05/28/2011  . SPL (spondylolisthesis) 05/28/2011  . PERSONAL HX COLONIC POLYPS 04/19/2009  . IRRITABLE BOWEL SYNDROME 04/06/2008  . ABDOMINAL BLOATING 03/07/2008  . CHANGE IN BOWELS 03/07/2008  . ABDOMINAL PAIN-MULTIPLE SITES 03/07/2008  . Essential hypertension 03/06/2008  . GERD 03/06/2008  . GASTRITIS 03/06/2008  . CHRONIC RESPIRATORY DISEASE ARISE PERINTL PERIOD 03/06/2008  . HYPERLIPIDEMIA 02/06/2003   Ihor [place], [place]; [place]  Aldona Lento 11/22/2015, 10:07 AM  [place], [place],  [address] Phone: [phone]   Fax:  [phone]  Name: Alexander Nunez MRN: [id number] Date of Birth: 08-11-1945

## 2015-11-26 ENCOUNTER — Ambulatory Visit (HOSPITAL_COMMUNITY): Payer: Medicare Other | Admitting: Physical Therapy

## 2015-11-26 DIAGNOSIS — S81839S Puncture wound without foreign body, unspecified lower leg, sequela: Secondary | ICD-10-CM | POA: Diagnosis not present

## 2015-11-26 DIAGNOSIS — M79662 Pain in left lower leg: Secondary | ICD-10-CM

## 2015-11-26 NOTE — Therapy (Signed)
[place], [place], [address] Phone: [phone]   Fax:  [phone]  Wound Care Therapy  Patient Details  Name: Alexander Nunez MRN: [id number] Date of Birth: 1945-11-23 Referring Provider: [employer]  Encounter Date: 11/26/2015      PT End of Session - 11/26/15 1259    Visit Number 5   Number of Visits 8   Date for PT Re-Evaluation 12/12/15   Authorization Type medicare   Authorization - Visit Number 5   Authorization - Number of Visits 8   PT Start Time 0908   PT Stop Time 0940   PT Time Calculation (min) 32 min   Activity Tolerance Patient tolerated treatment well   Behavior During Therapy [facility] for tasks assessed/performed      Past Medical History:  Diagnosis Date  . Anxiety   . Back pain, chronic   . Colon polyp   . GERD (gastroesophageal reflux disease)   . Heart palpitations   . High cholesterol   . Hypertension   . Irritable bowel syndrome   . Osteoporosis   . Psoriasis     Past Surgical History:  Procedure Laterality Date  . BACK SURGERY  2015  . CHOLECYSTECTOMY N/A 10/12/2015   Procedure: LAPAROSCOPIC CHOLECYSTECTOMY;  Surgeon: Aviva Signs, MD;  Location: [facility];  Service: General;  Laterality: N/A;  . FOOT FRACTURE SURGERY     left foot  . KNEE ARTHROSCOPY     left    There were no vitals filed for this visit.                  Wound Therapy - 11/26/15 1255    Subjective Dressings intact.  STates it doesnt hurt a bit.  States he played golf 2X over the weeked without any difficulty.     Patient and Family Stated Goals wound to heal    Date of Onset 11/01/15   Prior Treatments antibiotic ointment and antibiotic    Pain Assessment No/denies pain   Multiple Pain Sites No   Wound Properties Date First Assessed: 11/12/15 Time First Assessed: [id number] Wound Type: Laceration   Dressing Type Gauze (Comment)  medihoney, 2x2, kerlix, cotton, coban and netting    Dressing  Changed Changed   Dressing Status Clean;Dry;Intact   Dressing Change Frequency PRN   Site / Wound Assessment Black;Brown;Granulation tissue   % Wound base Red or Granulating 85%   % Wound base Yellow 15%   % Wound base Black 0%   Peri-wound Assessment Edema;Erythema (blanchable)   Drainage Amount Minimal   Drainage Description Serosanguineous   Treatment Hydrotherapy (Pulse lavage);Cleansed;Debridement (Selective)   Pulsed lavage therapy - wound location entire wound   Pulsed Lavage with Suction (psi) 4 psi   Pulsed Lavage with Suction - Normal Saline Used 1000 mL   Pulsed Lavage Tip Tip with splash shield   Selective Debridement - Location wound site   Selective Debridement - Tools Used Forceps;Scalpel   Selective Debridement - Tissue Removed eschar    Wound Therapy - Clinical Statement continued with pulsed lavage this session.  Overall redution in swelling and erythema with increased granulation.  Wound did not produce any pus or increased drainage, ever when manually expressed.  Overall reduced induration perimeter of wound as well.  continued with medihoney gel to progress to healing state.    Wound Therapy - Functional Problem List The more Mr. Boy walks the more he has pain    Factors  Delaying/Impairing Wound Healing Infection - systemic/local;Polypharmacy   Hydrotherapy Plan Debridement;Dressing change;Patient/family education   Wound Therapy - Frequency Other (comment)  2x/wk for 4 weeks   Wound Plan debridement and dressing change using compression dressing.  discontinue pulsed lavage.   Dressing  medihoney, 2x2, kerlix,netting. coban and netting                    PT Short Term Goals - 11/14/15 1035      PT SHORT TERM GOAL #1   Title Pt to verbalize signs and symptoms of cellulitis and the importance of seeking medical assistance if it occurs   Time 2   Period Weeks   Status Achieved     PT SHORT TERM GOAL #2   Title Pt wound to be 75% granulated to  reduce risk of infection    Time 2   Period Weeks   Status On-going     PT SHORT TERM GOAL #3   Title Pt swelling to be decreased to allow shoes to fit normally    Time 2   Period Weeks   Status On-going           PT Long Term Goals - 11/14/15 1035      PT LONG TERM GOAL #1   Title Wound to be healed    Time 4   Period Weeks   Status On-going             Patient will benefit from skilled therapeutic intervention in order to improve the following deficits and impairments:     Visit Diagnosis: Pain in left lower leg  Puncture wound of lower leg with complication, sequela     Problem List Patient Active Problem List   Diagnosis Date Noted  . Atypical chest pain 05/04/2014  . Sleep apnea 05/04/2014  . Abdominal bloating 07/22/2013  . Spermatocele 05/02/2013  . Chronic prostatitis 01/31/2013  . Pain in testicle 01/31/2013  . Weakness of left leg 08/12/2012  . Generalized abdominal pain 09/02/2011  . Lumbar canal stenosis 05/28/2011  . Degenerative arthritis of lumbar spine 05/28/2011  . SPL (spondylolisthesis) 05/28/2011  . PERSONAL HX COLONIC POLYPS 04/19/2009  . IRRITABLE BOWEL SYNDROME 04/06/2008  . ABDOMINAL BLOATING 03/07/2008  . CHANGE IN BOWELS 03/07/2008  . ABDOMINAL PAIN-MULTIPLE SITES 03/07/2008  . Essential hypertension 03/06/2008  . GERD 03/06/2008  . GASTRITIS 03/06/2008  . CHRONIC RESPIRATORY DISEASE ARISE PERINTL PERIOD 03/06/2008  . HYPERLIPIDEMIA 02/06/2003    Teena Irani, PTA/CLT [phone]  11/26/2015, 1:01 PM  [facility] [address] [place], [place], [address] Phone: [phone]   Fax:  [phone]  Name: Alexander Nunez MRN: [id number] Date of Birth: October 31, 1945

## 2015-11-27 ENCOUNTER — Ambulatory Visit (HOSPITAL_COMMUNITY): Payer: Medicare Other | Admitting: Physical Therapy

## 2015-11-29 ENCOUNTER — Ambulatory Visit (HOSPITAL_COMMUNITY): Payer: Medicare Other | Admitting: Physical Therapy

## 2015-11-29 DIAGNOSIS — S81839S Puncture wound without foreign body, unspecified lower leg, sequela: Secondary | ICD-10-CM

## 2015-11-29 DIAGNOSIS — M79662 Pain in left lower leg: Secondary | ICD-10-CM | POA: Diagnosis not present

## 2015-11-29 NOTE — Therapy (Signed)
[place], [place], [address] Phone: [phone]   Fax:  [phone]  Wound Care Therapy  Patient Details  Name: Alexander Nunez MRN: [id number] Date of Birth: 03/22/1945 Referring Provider: [employer]  Encounter Date: 11/29/2015      PT End of Session - 11/29/15 1153    Visit Number 6   Number of Visits 8   Date for PT Re-Evaluation 12/12/15   Authorization Type medicare   Authorization - Visit Number 6   Authorization - Number of Visits 8   PT Start Time [phone]   PT Stop Time 1030   PT Time Calculation (min) 42 min   Activity Tolerance Patient tolerated treatment well   Behavior During Therapy [facility] for tasks assessed/performed      Past Medical History:  Diagnosis Date  . Anxiety   . Back pain, chronic   . Colon polyp   . GERD (gastroesophageal reflux disease)   . Heart palpitations   . High cholesterol   . Hypertension   . Irritable bowel syndrome   . Osteoporosis   . Psoriasis     Past Surgical History:  Procedure Laterality Date  . BACK SURGERY  2015  . CHOLECYSTECTOMY N/A 10/12/2015   Procedure: LAPAROSCOPIC CHOLECYSTECTOMY;  Surgeon: Aviva Signs, MD;  Location: [facility];  Service: General;  Laterality: N/A;  . FOOT FRACTURE SURGERY     left foot  . KNEE ARTHROSCOPY     left    There were no vitals filed for this visit.                  Wound Therapy - 11/29/15 1146    Subjective Pt states that he had to return for his wound to be redressed after his last visit due to the bandaging being painful.  He feels it was secondary to not having compression from his foot to his knee.    Patient and Family Stated Goals wound to heal    Date of Onset 11/01/15   Prior Treatments antibiotic ointment and antibiotic    Pain Assessment No/denies pain   Wound Properties Date First Assessed: 11/12/15 Time First Assessed: [id number] Wound Type: Laceration   Dressing Type Gauze (Comment)   medihoney, 2x2, kerlix, cotton, coban and netting    Dressing Status Clean;Dry;Intact   Dressing Change Frequency PRN   Site / Wound Assessment Black;Brown;Granulation tissue   % Wound base Red or Granulating 85%   % Wound base Yellow 15%   % Wound base Black 0%   Peri-wound Assessment Edema;Erythema (blanchable)   Wound Length (cm) 1.5 cm   Wound Width (cm) 4 cm   Wound Depth (cm) 0.3 cm   Tunneling (cm) lateral tunnels  towards 10:00  2 holes: lateral tunnels 2.0cm; medial tunnels inferior 1.cm   Drainage Amount Minimal   Drainage Description Serosanguineous   Treatment Cleansed;Debridement (Selective)   Pulsed lavage therapy - wound location --   Pulsed Lavage with Suction (psi) --   Pulsed Lavage with Suction - Normal Saline Used --   Pulsed Lavage Tip --   Selective Debridement - Location wound site   Selective Debridement - Tools Used Forceps;Scalpel   Selective Debridement - Tissue Removed eschar    Wound Therapy - Clinical Statement Discontinued pulse lavage on this session.  The wound has two holes that tunnel the greatest being along the lateral superior border of the wound with 2.0 cm depth.  The medial hole tunnels  1.0 cm.     Wound Therapy - Functional Problem List Walking is no longer painful    Factors Delaying/Impairing Wound Healing Infection - systemic/local;Polypharmacy   Hydrotherapy Plan Debridement;Dressing change;Patient/family education   Wound Therapy - Frequency Other (comment)  2x/wk for 4 weeks   Wound Plan debridement and dressing change using compression dressing.  discontinue pulsed lavage.   Dressing  medihoney, 2x2, kerlix,netting. coban and netting                    PT Short Term Goals - 11/29/15 1157      PT SHORT TERM GOAL #1   Title Pt to verbalize signs and symptoms of cellulitis and the importance of seeking medical assistance if it occurs   Time 2   Period Weeks   Status Achieved     PT SHORT TERM GOAL #2   Title Pt  wound to be 75% granulated to reduce risk of infection    Time 2   Period Weeks   Status Achieved     PT SHORT TERM GOAL #3   Title Pt swelling to be decreased to allow shoes to fit normally    Time 2   Period Weeks   Status Achieved           PT Long Term Goals - 11/29/15 1157      PT LONG TERM GOAL #1   Title Wound to be healed    Time 4   Period Weeks   Status On-going               Plan - 11/29/15 1154    Clinical Impression Statement as above    Rehab Potential Good   PT Frequency 2x / week   PT Duration 4 weeks   PT Treatment/Interventions ADLs/Self Care Home Management;Patient/family education;Other (comment)  debridement    PT Next Visit Plan continue with   debridement, dressing change measure wound once a week   Consulted and Agree with Plan of Care Patient      Patient will benefit from skilled therapeutic intervention in order to improve the following deficits and impairments:  Difficulty walking, Increased edema, Pain  Visit Diagnosis: Pain in left lower leg  Puncture wound of lower leg with complication, sequela     Problem List Patient Active Problem List   Diagnosis Date Noted  . Atypical chest pain 05/04/2014  . Sleep apnea 05/04/2014  . Abdominal bloating 07/22/2013  . Spermatocele 05/02/2013  . Chronic prostatitis 01/31/2013  . Pain in testicle 01/31/2013  . Weakness of left leg 08/12/2012  . Generalized abdominal pain 09/02/2011  . Lumbar canal stenosis 05/28/2011  . Degenerative arthritis of lumbar spine 05/28/2011  . SPL (spondylolisthesis) 05/28/2011  . PERSONAL HX COLONIC POLYPS 04/19/2009  . IRRITABLE BOWEL SYNDROME 04/06/2008  . ABDOMINAL BLOATING 03/07/2008  . CHANGE IN BOWELS 03/07/2008  . ABDOMINAL PAIN-MULTIPLE SITES 03/07/2008  . Essential hypertension 03/06/2008  . GERD 03/06/2008  . GASTRITIS 03/06/2008  . CHRONIC RESPIRATORY DISEASE ARISE PERINTL PERIOD 03/06/2008  . HYPERLIPIDEMIA 02/06/2003    Rayetta Humphrey, PT CLT [phone] 11/29/2015, 11:58 AM  [place], [place], [address] Phone: [phone]   Fax:  [phone]  Name: SAMER BOTBYL MRN: [id number] Date of Birth: [birth year]

## 2015-12-03 ENCOUNTER — Ambulatory Visit (HOSPITAL_COMMUNITY): Payer: Medicare Other | Admitting: Physical Therapy

## 2015-12-03 DIAGNOSIS — S81839S Puncture wound without foreign body, unspecified lower leg, sequela: Secondary | ICD-10-CM | POA: Diagnosis not present

## 2015-12-03 DIAGNOSIS — M79662 Pain in left lower leg: Secondary | ICD-10-CM | POA: Diagnosis not present

## 2015-12-03 NOTE — Therapy (Signed)
[place], [place], [address] Phone: [phone]   Fax:  [phone]  Wound Care Therapy  Patient Details  Name: Alexander Nunez MRN: [id number] Date of Birth: 1945-07-01 Referring Provider: [employer]  Encounter Date: 12/03/2015      PT End of Session - 12/03/15 0948    Visit Number 7   Number of Visits 8   Date for PT Re-Evaluation 12/12/15   Authorization Type medicare   Authorization - Visit Number 7   Authorization - Number of Visits 8   PT Start Time 0910   PT Stop Time 0935   PT Time Calculation (min) 25 min   Activity Tolerance Patient tolerated treatment well   Behavior During Therapy [facility] for tasks assessed/performed      Past Medical History:  Diagnosis Date  . Anxiety   . Back pain, chronic   . Colon polyp   . GERD (gastroesophageal reflux disease)   . Heart palpitations   . High cholesterol   . Hypertension   . Irritable bowel syndrome   . Osteoporosis   . Psoriasis     Past Surgical History:  Procedure Laterality Date  . BACK SURGERY  2015  . CHOLECYSTECTOMY N/A 10/12/2015   Procedure: LAPAROSCOPIC CHOLECYSTECTOMY;  Surgeon: Aviva Signs, MD;  Location: [facility];  Service: General;  Laterality: N/A;  . FOOT FRACTURE SURGERY     left foot  . KNEE ARTHROSCOPY     left    There were no vitals filed for this visit.                  Wound Therapy - 12/03/15 0944    Subjective Pt states his back has been bothering him.  States he is not hurting at all in his leg or around his wound.    Patient and Family Stated Goals wound to heal    Date of Onset 11/01/15   Prior Treatments antibiotic ointment and antibiotic    Pain Assessment No/denies pain   Wound Properties Date First Assessed: 11/12/15 Time First Assessed: [id number] Wound Type: Laceration   Dressing Type Gauze (Comment)  medihoney, 2x2, kerlix, cotton, coban and netting    Dressing Changed Changed   Dressing Status  Clean;Dry;Intact   Dressing Change Frequency PRN   Site / Wound Assessment Black;Brown;Granulation tissue   % Wound base Red or Granulating 85%   % Wound base Yellow 15%   % Wound base Black 0%   Peri-wound Assessment Edema;Erythema (blanchable)   Drainage Amount Minimal   Drainage Description Serosanguineous   Treatment Cleansed;Debridement (Selective)   Selective Debridement - Location wound site   Selective Debridement - Tools Used Forceps;Scalpel   Selective Debridement - Tissue Removed slough   Wound Therapy - Clinical Statement Irrigated wound well.  Debrided some slough from wound, however bleeds easily so removed very little. Unable to discover any depth or tunneling this session.  Continued with medihoney gel and compression bandage using coban.  Pt reported overall comfort.     Wound Therapy - Functional Problem List Walking is no longer painful    Factors Delaying/Impairing Wound Healing Infection - systemic/local;Polypharmacy   Hydrotherapy Plan Debridement;Dressing change;Patient/family education   Wound Therapy - Frequency Other (comment)  2x/wk for 4 weeks   Wound Plan debridement and dressing change using compression dressing.  discontinue pulsed lavage.   Dressing  medihoney, 2x2, kerlix,netting. coban and netting  PT Short Term Goals - 11/29/15 1157      PT SHORT TERM GOAL #1   Title Pt to verbalize signs and symptoms of cellulitis and the importance of seeking medical assistance if it occurs   Time 2   Period Weeks   Status Achieved     PT SHORT TERM GOAL #2   Title Pt wound to be 75% granulated to reduce risk of infection    Time 2   Period Weeks   Status Achieved     PT SHORT TERM GOAL #3   Title Pt swelling to be decreased to allow shoes to fit normally    Time 2   Period Weeks   Status Achieved           PT Long Term Goals - 11/29/15 1157      PT LONG TERM GOAL #1   Title Wound to be healed    Time 4   Period  Weeks   Status On-going             Patient will benefit from skilled therapeutic intervention in order to improve the following deficits and impairments:     Visit Diagnosis: Pain in left lower leg  Puncture wound of lower leg with complication, sequela     Problem List Patient Active Problem List   Diagnosis Date Noted  . Atypical chest pain 05/04/2014  . Sleep apnea 05/04/2014  . Abdominal bloating 07/22/2013  . Spermatocele 05/02/2013  . Chronic prostatitis 01/31/2013  . Pain in testicle 01/31/2013  . Weakness of left leg 08/12/2012  . Generalized abdominal pain 09/02/2011  . Lumbar canal stenosis 05/28/2011  . Degenerative arthritis of lumbar spine 05/28/2011  . SPL (spondylolisthesis) 05/28/2011  . PERSONAL HX COLONIC POLYPS 04/19/2009  . IRRITABLE BOWEL SYNDROME 04/06/2008  . ABDOMINAL BLOATING 03/07/2008  . CHANGE IN BOWELS 03/07/2008  . ABDOMINAL PAIN-MULTIPLE SITES 03/07/2008  . Essential hypertension 03/06/2008  . GERD 03/06/2008  . GASTRITIS 03/06/2008  . CHRONIC RESPIRATORY DISEASE ARISE PERINTL PERIOD 03/06/2008  . HYPERLIPIDEMIA 02/06/2003    Teena Irani, PTA/CLT [phone]  12/03/2015, 9:50 AM  [place] [address] [place], [place], [address] Phone: [phone]   Fax:  [phone]  Name: AALIJAH ALTAMIRA MRN: [id number] Date of Birth: 12-11-1945

## 2015-12-04 ENCOUNTER — Other Ambulatory Visit (HOSPITAL_COMMUNITY): Payer: Self-pay | Admitting: Endocrinology

## 2015-12-04 DIAGNOSIS — E041 Nontoxic single thyroid nodule: Secondary | ICD-10-CM

## 2015-12-06 ENCOUNTER — Ambulatory Visit (HOSPITAL_COMMUNITY): Payer: Medicare Other | Attending: Family Medicine | Admitting: Physical Therapy

## 2015-12-06 DIAGNOSIS — X58XXXS Exposure to other specified factors, sequela: Secondary | ICD-10-CM | POA: Diagnosis not present

## 2015-12-06 DIAGNOSIS — M79662 Pain in left lower leg: Secondary | ICD-10-CM

## 2015-12-06 DIAGNOSIS — S81839S Puncture wound without foreign body, unspecified lower leg, sequela: Secondary | ICD-10-CM | POA: Diagnosis not present

## 2015-12-06 NOTE — Therapy (Signed)
[place], [place], [address] Phone: [phone]   Fax:  [phone]  Wound Care Therapy  Patient Details  Name: Alexander Nunez MRN: [id number] Date of Birth: 22-Jun-1945 Referring Provider: [employer]  Encounter Date: 12/06/2015      PT End of Session - 12/06/15 0935    Visit Number 8   Number of Visits 8   Date for PT Re-Evaluation 12/12/15   Authorization Type medicare   Authorization - Visit Number 8   Authorization - Number of Visits 8   PT Start Time 0905   PT Stop Time 0930   PT Time Calculation (min) 25 min   Activity Tolerance Patient tolerated treatment well   Behavior During Therapy [facility] for tasks assessed/performed      Past Medical History:  Diagnosis Date  . Anxiety   . Back pain, chronic   . Colon polyp   . GERD (gastroesophageal reflux disease)   . Heart palpitations   . High cholesterol   . Hypertension   . Irritable bowel syndrome   . Osteoporosis   . Psoriasis     Past Surgical History:  Procedure Laterality Date  . BACK SURGERY  2015  . CHOLECYSTECTOMY N/A 10/12/2015   Procedure: LAPAROSCOPIC CHOLECYSTECTOMY;  Surgeon: Aviva Signs, MD;  Location: [facility];  Service: General;  Laterality: N/A;  . FOOT FRACTURE SURGERY     left foot  . KNEE ARTHROSCOPY     left    There were no vitals filed for this visit.                  Wound Therapy - 12/06/15 0929    Subjective Pt states that he feels like his bandage has rubbed on him but no real pain.  Played golf yesterday    Patient and Family Stated Goals wound to heal    Date of Onset 11/01/15   Prior Treatments antibiotic ointment and antibiotic    Pain Assessment No/denies pain   Wound Properties Date First Assessed: 11/12/15 Time First Assessed: 6681 Wound Type: Laceration   Dressing Type Gauze (Comment)  medihoney, 2x2, kerlix, cotton, coban and netting    Dressing Changed Changed   Dressing Status  Clean;Dry;Intact   Dressing Change Frequency PRN   Site / Wound Assessment Granulation tissue   % Wound base Red or Granulating 95%   % Wound base Yellow 5%   % Wound base Black 0%   Peri-wound Assessment Intact   Wound Length (cm) 1.5 cm  was 1.5 on 10/26   Wound Width (cm) 2.5 cm  was 4 on 10/26   Wound Depth (cm) 0.1 cm  was .3 on 10/26   Tunneling (cm) tunneling has closed in    Drainage Amount Minimal   Drainage Description Serosanguineous   Selective Debridement - Location wound site   Selective Debridement - Tools Used Forceps   Selective Debridement - Tissue Removed slough   Wound Therapy - Clinical Statement Noted blister lateral to wound where dressing has rubbed.  Blister was popped and are dressed with wound.  Wound has filled in significantly.  Pt now feels confident in self care.   Will discharge to self care for remaining healing.    Wound Therapy - Functional Problem List Walking is no longer painful    Hydrotherapy Plan --  discharge   Wound Therapy - Frequency Other (comment)  2x/wk for 4 weeks   Wound Plan discharge  Dressing  medihoney,4x4, kling and netting.                 PT Education - 12/07/2015 0934    Education provided Yes   Education Details self care ie showering and cleansing wound every day followed by dressing    Person(s) Educated Patient   Methods Explanation   Comprehension Verbalized understanding          PT Short Term Goals - 12/07/2015 0936      PT SHORT TERM GOAL #1   Title Pt to verbalize signs and symptoms of cellulitis and the importance of seeking medical assistance if it occurs   Time 2   Period Weeks   Status Achieved     PT SHORT TERM GOAL #2   Title Pt wound to be 75% granulated to reduce risk of infection    Time 2   Period Weeks   Status Achieved     PT SHORT TERM GOAL #3   Title Pt swelling to be decreased to allow shoes to fit normally    Time 2   Period Weeks   Status Achieved           PT  Long Term Goals - 07-Dec-2015 0936      PT LONG TERM GOAL #1   Title Wound to be healed    Time 4   Period Weeks   Status Partially Met               Plan - 12/07/2015 0935    Clinical Impression Statement Wound well granulated.  Pt able to care for wound on his own    Rehab Potential Good   PT Frequency 2x / week   PT Duration 4 weeks   PT Treatment/Interventions ADLs/Self Care Home Management;Patient/family education;Other (comment)  debridement    PT Next Visit Plan Discharge pt to home care.    Consulted and Agree with Plan of Care Patient      Patient will benefit from skilled therapeutic intervention in order to improve the following deficits and impairments:  Difficulty walking, Increased edema, Pain  Visit Diagnosis: Pain in left lower leg  Puncture wound of lower leg with complication, sequela      G-Codes - 12-07-15 0936    Functional Assessment Tool Used granulation of wound    Functional Limitation Other PT primary   Other PT Primary Goal Status (W0981) At least 1 percent but less than 20 percent impaired, limited or restricted   Other PT Primary Discharge Status (X9147) At least 1 percent but less than 20 percent impaired, limited or restricted       Problem List Patient Active Problem List   Diagnosis Date Noted  . Atypical chest pain 05/04/2014  . Sleep apnea 05/04/2014  . Abdominal bloating 07/22/2013  . Spermatocele 05/02/2013  . Chronic prostatitis 01/31/2013  . Pain in testicle 01/31/2013  . Weakness of left leg 08/12/2012  . Generalized abdominal pain 09/02/2011  . Lumbar canal stenosis 05/28/2011  . Degenerative arthritis of lumbar spine 05/28/2011  . SPL (spondylolisthesis) 05/28/2011  . PERSONAL HX COLONIC POLYPS 04/19/2009  . IRRITABLE BOWEL SYNDROME 04/06/2008  . ABDOMINAL BLOATING 03/07/2008  . CHANGE IN BOWELS 03/07/2008  . ABDOMINAL PAIN-MULTIPLE SITES 03/07/2008  . Essential hypertension 03/06/2008  . GERD 03/06/2008  .  GASTRITIS 03/06/2008  . CHRONIC RESPIRATORY DISEASE ARISE PERINTL PERIOD 03/06/2008  . HYPERLIPIDEMIA 02/06/2003   Rayetta Humphrey, PT CLT [phone] Dec 07, 2015, 9:37 AM  [place]  [place], [place], [address] Phone: [phone]   Fax:  [phone]  Name: Alexander Nunez MRN: [id number] Date of Birth: November 22, 1945  PHYSICAL THERAPY DISCHARGE SUMMARY  Visits from Start of Care: 8  Current functional level related to goals / functional outcomes: As above    Remaining deficits: As above   Education / Equipment: Wound care  Plan: Patient agrees to discharge.  Patient goals were partially met. Patient is being discharged due to being pleased with the current functional level.  ?????        Pt feel comfortable with self care for wound at this point.  Will discharge.  Rayetta Humphrey, [place] CLT [phone]

## 2015-12-13 ENCOUNTER — Other Ambulatory Visit: Payer: Self-pay | Admitting: Neurosurgery

## 2015-12-13 DIAGNOSIS — M47816 Spondylosis without myelopathy or radiculopathy, lumbar region: Secondary | ICD-10-CM

## 2015-12-14 ENCOUNTER — Ambulatory Visit (HOSPITAL_COMMUNITY): Admission: RE | Admit: 2015-12-14 | Payer: Medicare Other | Source: Ambulatory Visit

## 2015-12-17 ENCOUNTER — Ambulatory Visit (HOSPITAL_COMMUNITY)
Admission: RE | Admit: 2015-12-17 | Discharge: 2015-12-17 | Disposition: A | Discharge status: Home / Self Care | Payer: Medicare Other | Source: Ambulatory Visit | Attending: Endocrinology | Admitting: Endocrinology

## 2015-12-17 ENCOUNTER — Encounter (HOSPITAL_COMMUNITY): Payer: Self-pay

## 2015-12-17 DIAGNOSIS — E041 Nontoxic single thyroid nodule: Secondary | ICD-10-CM | POA: Insufficient documentation

## 2015-12-17 MED ORDER — LIDOCAINE HCL (PF) 2 % IJ SOLN
INTRAMUSCULAR | Status: AC
  Filled 2015-12-17: qty 10

## 2015-12-17 NOTE — Discharge Instructions (Signed)

## 2015-12-17 NOTE — Procedures (Signed)
PreOperative Dx: RT thyroid nodule Postoperative Dx: RT thyroid nodule Procedure:   US guided FNA of RT thyroid nodule Radiologist:  Thornton Papas Anesthesia:  6 ml of 2% lidocaine Specimen:  FNA x 4  EBL:   < 1 ml Complications: None

## 2015-12-18 ENCOUNTER — Ambulatory Visit
Admission: RE | Admit: 2015-12-18 | Discharge: 2015-12-18 | Disposition: A | Discharge status: Home / Self Care | Payer: Medicare Other | Source: Ambulatory Visit | Attending: Neurosurgery | Admitting: Neurosurgery

## 2015-12-18 ENCOUNTER — Other Ambulatory Visit: Payer: Self-pay | Admitting: Neurosurgery

## 2015-12-18 DIAGNOSIS — M47817 Spondylosis without myelopathy or radiculopathy, lumbosacral region: Secondary | ICD-10-CM | POA: Diagnosis not present

## 2015-12-18 DIAGNOSIS — M47816 Spondylosis without myelopathy or radiculopathy, lumbar region: Secondary | ICD-10-CM

## 2015-12-18 MED ORDER — IOPAMIDOL (ISOVUE-M 200) INJECTION 41%
1.0000 mL | Freq: Once | INTRAMUSCULAR | Status: AC
  Administered 2015-12-18: 1 mL via EPIDURAL

## 2015-12-18 MED ORDER — METHYLPREDNISOLONE ACETATE 40 MG/ML INJ SUSP (RADIOLOG
120.0000 mg | Freq: Once | INTRAMUSCULAR | Status: AC
  Administered 2015-12-18: 120 mg via EPIDURAL

## 2016-01-29 DIAGNOSIS — N179 Acute kidney failure, unspecified: Secondary | ICD-10-CM | POA: Diagnosis not present

## 2016-01-29 DIAGNOSIS — R197 Diarrhea, unspecified: Secondary | ICD-10-CM | POA: Diagnosis not present

## 2016-01-29 DIAGNOSIS — N400 Enlarged prostate without lower urinary tract symptoms: Secondary | ICD-10-CM | POA: Diagnosis not present

## 2016-01-29 DIAGNOSIS — I10 Essential (primary) hypertension: Secondary | ICD-10-CM | POA: Diagnosis not present

## 2016-01-29 DIAGNOSIS — I959 Hypotension, unspecified: Secondary | ICD-10-CM | POA: Diagnosis not present

## 2016-01-29 DIAGNOSIS — E785 Hyperlipidemia, unspecified: Secondary | ICD-10-CM | POA: Diagnosis not present

## 2016-01-29 DIAGNOSIS — R112 Nausea with vomiting, unspecified: Secondary | ICD-10-CM | POA: Diagnosis not present

## 2016-01-29 DIAGNOSIS — R1013 Epigastric pain: Secondary | ICD-10-CM | POA: Diagnosis not present

## 2016-01-29 DIAGNOSIS — E860 Dehydration: Secondary | ICD-10-CM | POA: Diagnosis not present

## 2016-01-29 DIAGNOSIS — A084 Viral intestinal infection, unspecified: Secondary | ICD-10-CM | POA: Diagnosis not present

## 2016-01-29 DIAGNOSIS — R109 Unspecified abdominal pain: Secondary | ICD-10-CM | POA: Diagnosis not present

## 2016-01-30 DIAGNOSIS — R112 Nausea with vomiting, unspecified: Secondary | ICD-10-CM | POA: Diagnosis not present

## 2016-01-30 DIAGNOSIS — R109 Unspecified abdominal pain: Secondary | ICD-10-CM | POA: Diagnosis not present

## 2016-01-30 DIAGNOSIS — R197 Diarrhea, unspecified: Secondary | ICD-10-CM | POA: Diagnosis not present

## 2016-01-30 DIAGNOSIS — N179 Acute kidney failure, unspecified: Secondary | ICD-10-CM | POA: Diagnosis not present

## 2016-02-07 DIAGNOSIS — Z6839 Body mass index (BMI) 39.0-39.9, adult: Secondary | ICD-10-CM | POA: Diagnosis not present

## 2016-02-07 DIAGNOSIS — Z1389 Encounter for screening for other disorder: Secondary | ICD-10-CM | POA: Diagnosis not present

## 2016-02-07 DIAGNOSIS — N19 Unspecified kidney failure: Secondary | ICD-10-CM | POA: Diagnosis not present

## 2016-02-07 DIAGNOSIS — E860 Dehydration: Secondary | ICD-10-CM | POA: Diagnosis not present

## 2016-02-14 DIAGNOSIS — M47816 Spondylosis without myelopathy or radiculopathy, lumbar region: Secondary | ICD-10-CM | POA: Diagnosis not present

## 2016-02-14 DIAGNOSIS — M5116 Intervertebral disc disorders with radiculopathy, lumbar region: Secondary | ICD-10-CM | POA: Diagnosis not present

## 2016-02-19 ENCOUNTER — Other Ambulatory Visit (HOSPITAL_COMMUNITY): Payer: Self-pay | Admitting: Nurse Practitioner

## 2016-02-19 DIAGNOSIS — M47816 Spondylosis without myelopathy or radiculopathy, lumbar region: Secondary | ICD-10-CM

## 2016-02-27 ENCOUNTER — Ambulatory Visit (HOSPITAL_COMMUNITY)
Admission: RE | Admit: 2016-02-27 | Discharge: 2016-02-27 | Disposition: A | Discharge status: Home / Self Care | Payer: Medicare Other | Source: Ambulatory Visit | Attending: Nurse Practitioner | Admitting: Nurse Practitioner

## 2016-02-27 DIAGNOSIS — M5126 Other intervertebral disc displacement, lumbar region: Secondary | ICD-10-CM | POA: Insufficient documentation

## 2016-02-27 DIAGNOSIS — M47816 Spondylosis without myelopathy or radiculopathy, lumbar region: Secondary | ICD-10-CM

## 2016-02-27 DIAGNOSIS — Z981 Arthrodesis status: Secondary | ICD-10-CM | POA: Insufficient documentation

## 2016-02-27 DIAGNOSIS — I700 Atherosclerosis of aorta: Secondary | ICD-10-CM | POA: Insufficient documentation

## 2016-02-27 DIAGNOSIS — M545 Low back pain: Secondary | ICD-10-CM | POA: Insufficient documentation

## 2016-03-20 DIAGNOSIS — M47816 Spondylosis without myelopathy or radiculopathy, lumbar region: Secondary | ICD-10-CM | POA: Diagnosis not present

## 2016-03-20 DIAGNOSIS — M5136 Other intervertebral disc degeneration, lumbar region: Secondary | ICD-10-CM | POA: Diagnosis not present

## 2016-03-21 ENCOUNTER — Other Ambulatory Visit: Payer: Self-pay | Admitting: Nurse Practitioner

## 2016-03-21 DIAGNOSIS — M47816 Spondylosis without myelopathy or radiculopathy, lumbar region: Secondary | ICD-10-CM

## 2016-03-21 DIAGNOSIS — M5136 Other intervertebral disc degeneration, lumbar region: Secondary | ICD-10-CM

## 2016-04-01 ENCOUNTER — Other Ambulatory Visit: Payer: Medicare Other

## 2016-04-03 ENCOUNTER — Inpatient Hospital Stay
Admission: RE | Admit: 2016-04-03 | Discharge: 2016-04-03 | Disposition: A | Discharge status: Home / Self Care | Payer: Medicare Other | Source: Ambulatory Visit | Attending: Nurse Practitioner | Admitting: Nurse Practitioner

## 2016-04-03 ENCOUNTER — Emergency Department (HOSPITAL_COMMUNITY)
Admission: EM | Admit: 2016-04-03 | Discharge: 2016-04-03 | Disposition: A | Discharge status: Home / Self Care | Payer: Medicare Other | Attending: Emergency Medicine | Admitting: Emergency Medicine

## 2016-04-03 ENCOUNTER — Encounter (HOSPITAL_COMMUNITY): Payer: Self-pay | Admitting: *Deleted

## 2016-04-03 ENCOUNTER — Emergency Department (HOSPITAL_COMMUNITY): Payer: Medicare Other

## 2016-04-03 DIAGNOSIS — R072 Precordial pain: Secondary | ICD-10-CM

## 2016-04-03 DIAGNOSIS — Z7982 Long term (current) use of aspirin: Secondary | ICD-10-CM | POA: Insufficient documentation

## 2016-04-03 DIAGNOSIS — Z87891 Personal history of nicotine dependence: Secondary | ICD-10-CM | POA: Insufficient documentation

## 2016-04-03 DIAGNOSIS — I10 Essential (primary) hypertension: Secondary | ICD-10-CM | POA: Insufficient documentation

## 2016-04-03 DIAGNOSIS — I517 Cardiomegaly: Secondary | ICD-10-CM | POA: Diagnosis not present

## 2016-04-03 DIAGNOSIS — Z79899 Other long term (current) drug therapy: Secondary | ICD-10-CM | POA: Insufficient documentation

## 2016-04-03 DIAGNOSIS — Z6839 Body mass index (BMI) 39.0-39.9, adult: Secondary | ICD-10-CM | POA: Diagnosis not present

## 2016-04-03 DIAGNOSIS — Z1389 Encounter for screening for other disorder: Secondary | ICD-10-CM | POA: Diagnosis not present

## 2016-04-03 DIAGNOSIS — R0789 Other chest pain: Secondary | ICD-10-CM | POA: Diagnosis not present

## 2016-04-03 LAB — CBC
HEMATOCRIT: 40.2 % (ref 39.0–52.0)
Hemoglobin: 14 g/dL (ref 13.0–17.0)
MCH: 32.4 pg (ref 26.0–34.0)
MCHC: 34.8 g/dL (ref 30.0–36.0)
MCV: 93.1 fL (ref 78.0–100.0)
PLATELETS: 241 10*3/uL (ref 150–400)
RBC: 4.32 MIL/uL (ref 4.22–5.81)
RDW: 13.1 % (ref 11.5–15.5)
WBC: 9.6 10*3/uL (ref 4.0–10.5)

## 2016-04-03 LAB — BASIC METABOLIC PANEL
Anion gap: 9 (ref 5–15)
BUN: 20 mg/dL (ref 6–20)
CHLORIDE: 104 mmol/L (ref 101–111)
CO2: 23 mmol/L (ref 22–32)
CREATININE: 1.14 mg/dL (ref 0.61–1.24)
Calcium: 9.1 mg/dL (ref 8.9–10.3)
GFR calc Af Amer: >60 mL/min (ref >60)
GFR calc non Af Amer: >60 mL/min (ref >60)
GLUCOSE: 117 mg/dL — AB (ref 65–99)
POTASSIUM: 4 mmol/L (ref 3.5–5.1)
SODIUM: 136 mmol/L (ref 135–145)

## 2016-04-03 LAB — I-STAT TROPONIN, ED: Troponin i, poc: 0 ng/mL (ref 0.00–0.08)

## 2016-04-03 MED ORDER — ACETAMINOPHEN 500 MG PO TABS
1000.0000 mg | ORAL_TABLET | Freq: Once | ORAL | Status: AC
  Administered 2016-04-03: 1000 mg via ORAL
  Filled 2016-04-03: qty 2

## 2016-04-03 MED ORDER — FAMOTIDINE 20 MG PO TABS
20.0000 mg | ORAL_TABLET | Freq: Once | ORAL | Status: AC
  Administered 2016-04-03: 20 mg via ORAL
  Filled 2016-04-03: qty 1

## 2016-04-03 MED ORDER — ALUM & MAG HYDROXIDE-SIMETH 200-200-20 MG/5ML PO SUSP
30.0000 mL | Freq: Once | ORAL | Status: AC
  Administered 2016-04-03: 30 mL via ORAL
  Filled 2016-04-03: qty 30

## 2016-04-03 NOTE — ED Triage Notes (Signed)
Pt c/o intermittent chest pain that started Sunday, describes as "burning, tightness, pressure". Denies SOB, nausea, dizziness. Pt states, "I just don't feel real good". Pt went to see PCP this morning was given Nitro at 0800 with some relief of pain. Pt reports he has difficulty sleeping at night due to the pain.

## 2016-04-03 NOTE — ED Provider Notes (Signed)
[facility] DEPT Provider Note   CSN: [id number] Arrival date & time: 04/03/16  [id number]   By signing my name below, I, Hilbert Odor, attest that this documentation has been prepared under the direction and in the presence of Lajean Saver, MD. Electronically Signed: Hilbert Odor, Scribe. 04/03/16. 9:37 AM. History   Chief Complaint Chief Complaint  Patient presents with  . Chest Pain   HPI Comments: Alexander Nunez is a 71 y.o. male who presents to the Emergency Department complaining of constant chest pain since last night around 6pm. Pain is constant since 6 pm last night, and is at rest.  He states that the pain radiated from the right side of his chest to the left side. The patient states that he has had intermittent chest pain since Sunday. The patient thought that he may have indigestion at first. He states that a couple of days ago he had walked a mile on a couple different occasions - pt indicates not pain w exertion/exercise. Marland Kitchen He reports no pain yesterday morning. He states that this current episode of pain has never lasted this long. He describes this current episode as "burning, tightness, pressure". He states that the pain doesn't worsen after eating a meal. He states that he took an oxycodone with no significant relief at that time. He states that he went to his PCP this morning and was given Nitro around 8 am with some relief. He denies SOB, nausea, leg swelling, and dizziness. He denies hx of cad/MI in his family. He is currently on Protonix daily. He denies hx of blood clots. No recent surgery/immobility. No leg pain or swelling.  Hx gerd. His last endoscopy was years ago.  The history is provided by the patient. No language interpreter was used.  Chest Pain   This is a new problem. The current episode started yesterday. The problem occurs constantly. The pain is at a severity of 6/10. The quality of the pain is described as pressure-like. Duration of episode(s) is 12 hours.  Pertinent negatives include no abdominal pain, no back pain, no cough, no fever, no headaches, no shortness of breath and no vomiting. He has tried nitroglycerin for the symptoms. The treatment provided mild relief.    Past Medical History:  Diagnosis Date  . Anxiety   . Back pain, chronic   . Colon polyp   . GERD (gastroesophageal reflux disease)   . Heart palpitations   . High cholesterol   . Hypertension   . Irritable bowel syndrome   . Osteoporosis   . Psoriasis     Patient Active Problem List   Diagnosis Date Noted  . Atypical chest pain 05/04/2014  . Sleep apnea 05/04/2014  . Abdominal bloating 07/22/2013  . Spermatocele 05/02/2013  . Chronic prostatitis 01/31/2013  . Pain in testicle 01/31/2013  . Weakness of left leg 08/12/2012  . Generalized abdominal pain 09/02/2011  . Lumbar canal stenosis 05/28/2011  . Degenerative arthritis of lumbar spine 05/28/2011  . SPL (spondylolisthesis) 05/28/2011  . PERSONAL HX COLONIC POLYPS 04/19/2009  . IRRITABLE BOWEL SYNDROME 04/06/2008  . ABDOMINAL BLOATING 03/07/2008  . CHANGE IN BOWELS 03/07/2008  . ABDOMINAL PAIN-MULTIPLE SITES 03/07/2008  . Essential hypertension 03/06/2008  . GERD 03/06/2008  . GASTRITIS 03/06/2008  . CHRONIC RESPIRATORY DISEASE ARISE PERINTL PERIOD 03/06/2008  . HYPERLIPIDEMIA 02/06/2003    Past Surgical History:  Procedure Laterality Date  . BACK SURGERY  2015  . CHOLECYSTECTOMY N/A 10/12/2015   Procedure: LAPAROSCOPIC CHOLECYSTECTOMY;  Surgeon: Aviva Signs,  MD;  Location: [facility];  Service: General;  Laterality: N/A;  . FOOT FRACTURE SURGERY     left foot  . KNEE ARTHROSCOPY     left       Home Medications    Prior to Admission medications   Medication Sig Start Date End Date Taking? Authorizing Provider  ALPRAZolam Duanne Moron) 0.5 MG tablet Take 0.25-0.5 mg by mouth at bedtime as needed for sleep.     Historical Provider, MD  amoxicillin-clavulanate (AUGMENTIN) 875-125 MG tablet Take 1 tablet  by mouth 2 (two) times daily. 10 day course starting on 11/03/2015 11/03/15   Historical Provider, MD  aspirin EC 81 MG tablet Take 81 mg by mouth daily.    Historical Provider, MD  doxazosin (CARDURA) 4 MG tablet Take 4 mg by mouth at bedtime.      Historical Provider, MD  folic acid (FOLVITE) 1 MG tablet Take 1 mg by mouth every evening.     Historical Provider, MD  HYDROcodone-acetaminophen (NORCO/VICODIN) 5-325 MG tablet Take 2 tablets by mouth every 4 (four) hours as needed. Patient not taking: Reported on 11/08/2015 11/03/15   Fransico Meadow, PA-C  lisinopril (PRINIVIL,ZESTRIL) 10 MG tablet Take 10 mg by mouth every evening.     Historical Provider, MD  methotrexate (RHEUMATREX) 2.5 MG tablet Take 5 mg by mouth once a week. Take 2 tab weekly on Wednesday Caution:Chemotherapy. Protect from light.    Historical Provider, MD  metoprolol tartrate (LOPRESSOR) 25 MG tablet Take 1 tablet (25 mg total) by mouth 2 (two) times daily. 05/05/14   Radene Gunning, NP  mupirocin ointment (BACTROBAN) 2 % Apply 1 application topically 2 (two) times daily. Starting on 11/02/2015 11/02/15   Historical Provider, MD  oxyCODONE-acetaminophen (PERCOCET/ROXICET) 5-325 MG tablet Take 1-2 tablets by mouth every 4 (four) hours as needed for moderate pain or severe pain. 10/12/15   Aviva Signs, MD  pantoprazole (PROTONIX) 40 MG tablet Take 1 tablet (40 mg total) by mouth 2 (two) times daily. Patient taking differently: Take 40 mg by mouth every morning.  10/07/11   Ladene Artist, MD  pravastatin (PRAVACHOL) 20 MG tablet Take 20 mg by mouth every evening.     Historical Provider, MD  Probiotic Product (PROBIOTIC PO) Take 1 capsule by mouth daily.    Historical Provider, MD    Family History Family History  Problem Relation Age of Onset  . Arrhythmia Mother     Atrial fib  . Arrhythmia Sister     Atrial fib  . Stroke Sister   . Cancer Father     Bladder, Deceased   . Cancer Brother   . Colon cancer Neg Hx     Social  History Social History  Substance Use Topics  . Smoking status: Former Smoker    Packs/day: 2.00    Types: Cigarettes    Start date: 05/22/1962    Quit date: 05/21/1973  . Smokeless tobacco: Never Used     Comment: Quit 25 yrs now   . Alcohol use 8.4 oz/week    14 Shots of liquor per week     Comment: Rare      Allergies   Cephalexin and Contrast media [iodinated diagnostic agents]   Review of Systems Review of Systems  Constitutional: Negative for appetite change, fatigue and fever.  HENT: Negative for sore throat.   Eyes: Negative for redness.  Respiratory: Negative for cough and shortness of breath.   Cardiovascular: Positive for chest pain. Negative for  leg swelling.  Gastrointestinal: Negative for abdominal pain and vomiting.  Genitourinary: Negative for flank pain.  Musculoskeletal: Negative for back pain.  Skin: Negative for rash.  Neurological: Negative for headaches.  Hematological: Negative for adenopathy.  Psychiatric/Behavioral: Negative for confusion.     Physical Exam Updated Vital Signs BP 119/62   Pulse 63   Temp 98.1 F (36.7 C) (Oral)   Resp 19   Ht 5\' 11"  (1.803 m)   Wt 276 lb (125.2 kg)   SpO2 97%   BMI 38.49 kg/m   Physical Exam  Constitutional: He appears well-developed and well-nourished. No distress.  HENT:  Head: Normocephalic and atraumatic.  Eyes: Conjunctivae are normal.  Neck: Neck supple. No tracheal deviation present.  Cardiovascular: Normal rate, regular rhythm, normal heart sounds and intact distal pulses.  Exam reveals no gallop and no friction rub.   No murmur heard. Pulmonary/Chest: Effort normal and breath sounds normal. No respiratory distress. He exhibits no tenderness.  Abdominal: Soft. Bowel sounds are normal. He exhibits no distension. There is no tenderness.  Musculoskeletal: He exhibits no edema or tenderness.  Neurological: He is alert.  Skin: Skin is warm and dry. No rash noted. He is not diaphoretic.    Psychiatric: He has a normal mood and affect.  Nursing note and vitals reviewed.    ED Treatments / Results  DIAGNOSTIC STUDIES: Oxygen Saturation is 97% on RA, normal by my interpretation.    COORDINATION OF CARE: 9:10 AM Discussed treatment plan with pt at bedside and pt agreed to plan. I will check the patient's CXR, troponin, and blood work.  Labs (all labs ordered are listed, but only abnormal results are displayed) Results for orders placed or performed during the hospital encounter of [id number]  Basic metabolic panel  Result Value Ref Range   Sodium 136 135 - 145 mmol/L   Potassium 4.0 3.5 - 5.1 mmol/L   Chloride 104 101 - 111 mmol/L   CO2 23 22 - 32 mmol/L   Glucose, Bld 117 (H) 65 - 99 mg/dL   BUN 20 6 - 20 mg/dL   Creatinine, Ser 1.14 0.61 - 1.24 mg/dL   Calcium 9.1 8.9 - 10.3 mg/dL   GFR calc non Af Amer >60 >60 mL/min   GFR calc Af Amer >60 >60 mL/min   Anion gap 9 5 - 15  CBC  Result Value Ref Range   WBC 9.6 4.0 - 10.5 K/uL   RBC 4.32 4.22 - 5.81 MIL/uL   Hemoglobin 14.0 13.0 - 17.0 g/dL   HCT 40.2 39.0 - 52.0 %   MCV 93.1 78.0 - 100.0 fL   MCH 32.4 26.0 - 34.0 pg   MCHC 34.8 30.0 - 36.0 g/dL   RDW 13.1 11.5 - 15.5 %   Platelets 241 150 - 400 K/uL  I-stat troponin, ED  Result Value Ref Range   Troponin i, poc 0.00 0.00 - 0.08 ng/mL   Comment 3           Dg Chest 2 View  Result Date: 04/03/2016 CLINICAL DATA:  Chest tightness. EXAM: CHEST  2 VIEW COMPARISON:  CT 03/03/ 2017.  Chest x-ray 01/24/2015, 05/04/2014. FINDINGS: Mediastinum and hilar structures normal. Cardiomegaly with mild pulmonary vascular prominence and bilateral interstitial prominence. Mild CHF cannot be excluded. No pleural effusion or pneumothorax. IMPRESSION: Cardiomegaly with mild pulmonary vascular prominence and bilateral interstitial prominence consistent with mild CHF. Pneumonitis cannot be excluded. A component of underlying chronic interstitial lung disease cannot be excluded  Electronically Signed   By: Marcello Moores  Register   On: 04/03/2016 09:15    EKG  EKG Interpretation  Date/Time:  Thursday April 03 2016 08:29:05 EST Ventricular Rate:  68 PR Interval:    QRS Duration: 111 QT Interval:  400 QTC Calculation: 426 R Axis:   63 Text Interpretation:  Sinus rhythm Nonspecific T wave abnormality Baseline wander Confirmed by Ashok Cordia  MD, Lennette Bihari ([id number]) on 04/03/2016 8:32:54 AM       Radiology Dg Chest 2 View  Result Date: 04/03/2016 CLINICAL DATA:  Chest tightness. EXAM: CHEST  2 VIEW COMPARISON:  CT 03/03/ 2017.  Chest x-ray 01/24/2015, 05/04/2014. FINDINGS: Mediastinum and hilar structures normal. Cardiomegaly with mild pulmonary vascular prominence and bilateral interstitial prominence. Mild CHF cannot be excluded. No pleural effusion or pneumothorax. IMPRESSION: Cardiomegaly with mild pulmonary vascular prominence and bilateral interstitial prominence consistent with mild CHF. Pneumonitis cannot be excluded. A component of underlying chronic interstitial lung disease cannot be excluded Electronically Signed   By: Marcello Moores  Register   On: 04/03/2016 09:15    Procedures Procedures (including critical care time)  Medications Ordered in ED Medications  acetaminophen (TYLENOL) tablet 1,000 mg (not administered)  famotidine (PEPCID) tablet 20 mg (20 mg Oral Given 04/03/16 0912)  alum & mag hydroxide-simeth (MAALOX/MYLANTA) 200-200-20 MG/5ML suspension 30 mL (30 mLs Oral Given 04/03/16 0912)     Initial Impression / Assessment and Plan / ED Course  I have reviewed the triage vital signs and the nursing notes.  Pertinent labs & imaging results that were available during my care of the patient were reviewed by me and considered in my medical decision making (see chart for details).  Iv ns. Labs. Cxr.  Reviewed nursing notes and prior charts for additional history.   Pt notes gi symptoms, pepcid and maalox given.  Recheck pt comfortable. Improvement symptoms.  After  12+ hours of constant symptoms, trop 0.  Prior cardiac cath and cta chest for eval cp negative.  Pt currently appears stable for d/c.  Discussed outpt cardiology f/u, outpt stress test.    Final Clinical Impressions(s) / ED Diagnoses   Final diagnoses:  Precordial chest pain    New Prescriptions New Prescriptions   No medications on file   I personally performed the services described in this documentation, which was scribed in my presence. The recorded information has been reviewed and considered. Lajean Saver, MD    Lajean Saver, MD 04/03/16 [phone]

## 2016-04-03 NOTE — ED Notes (Signed)
Pts initial BP 88/41. Pt was given aspirin in Dr Cornelia Copa office. Pt reports that feeling is more like gas like pains and that's what he has attributed to causing CP. Pt has stress test scheduled

## 2016-04-03 NOTE — Discharge Instructions (Signed)
It was our pleasure to provide your ER care today - we hope that you feel better.  Continue protonix.  If Gi symptoms, you may also try pepcid, maalox, or gas-x as need for symptom relief.  Take tylenol as need.   For chest pain, follow up with cardiologist in coming week - discuss possible stress testing - see referral - call office today to arrange appointment.   Return to ER if worse, new symptoms, fevers, recurrent or persistent chest pain, trouble breathing, other concern.

## 2016-04-21 ENCOUNTER — Other Ambulatory Visit (HOSPITAL_COMMUNITY): Payer: Self-pay | Admitting: Family Medicine

## 2016-04-21 DIAGNOSIS — R1011 Right upper quadrant pain: Secondary | ICD-10-CM | POA: Diagnosis not present

## 2016-04-21 DIAGNOSIS — Z6839 Body mass index (BMI) 39.0-39.9, adult: Secondary | ICD-10-CM | POA: Diagnosis not present

## 2016-04-28 ENCOUNTER — Ambulatory Visit (HOSPITAL_COMMUNITY)
Admission: RE | Admit: 2016-04-28 | Discharge: 2016-04-28 | Disposition: A | Discharge status: Home / Self Care | Payer: Medicare Other | Source: Ambulatory Visit | Attending: Family Medicine | Admitting: Family Medicine

## 2016-04-28 DIAGNOSIS — I77819 Aortic ectasia, unspecified site: Secondary | ICD-10-CM | POA: Insufficient documentation

## 2016-04-28 DIAGNOSIS — Z9889 Other specified postprocedural states: Secondary | ICD-10-CM | POA: Insufficient documentation

## 2016-04-28 DIAGNOSIS — K5730 Diverticulosis of large intestine without perforation or abscess without bleeding: Secondary | ICD-10-CM | POA: Diagnosis not present

## 2016-04-28 DIAGNOSIS — I700 Atherosclerosis of aorta: Secondary | ICD-10-CM | POA: Insufficient documentation

## 2016-04-28 DIAGNOSIS — R1011 Right upper quadrant pain: Secondary | ICD-10-CM

## 2016-04-28 DIAGNOSIS — K5900 Constipation, unspecified: Secondary | ICD-10-CM | POA: Diagnosis not present

## 2016-05-01 ENCOUNTER — Ambulatory Visit (HOSPITAL_COMMUNITY): Payer: Medicare Other

## 2016-05-05 ENCOUNTER — Other Ambulatory Visit: Payer: Self-pay | Admitting: Physician Assistant

## 2016-05-05 ENCOUNTER — Encounter: Payer: Self-pay | Admitting: Physician Assistant

## 2016-05-05 ENCOUNTER — Ambulatory Visit (INDEPENDENT_AMBULATORY_CARE_PROVIDER_SITE_OTHER): Payer: Medicare Other | Admitting: Physician Assistant

## 2016-05-05 VITALS — BP 108/62 | HR 56 | Ht 71.0 in | Wt 274.0 lb

## 2016-05-05 DIAGNOSIS — K219 Gastro-esophageal reflux disease without esophagitis: Secondary | ICD-10-CM

## 2016-05-05 DIAGNOSIS — R194 Change in bowel habit: Secondary | ICD-10-CM | POA: Diagnosis not present

## 2016-05-05 DIAGNOSIS — R1084 Generalized abdominal pain: Secondary | ICD-10-CM | POA: Diagnosis not present

## 2016-05-05 MED ORDER — HYOSCYAMINE SULFATE 0.125 MG SL SUBL: 0.1250 mg | SUBLINGUAL_TABLET | SUBLINGUAL | 2 refills | Status: DC | PRN

## 2016-05-05 NOTE — Progress Notes (Signed)
Chief Complaint: Abdominal Pain, Change in bowel habits  HPI:  Alexander Nunez is a 71 year old Caucasian male with a past medical history significant for reflux, IBS, fatty liver disease and adenomatous polyps, as well as others listed below, who was referred to me by Redmond School, MD for a complaint of recent abdominal pain and change in bowel habits .    The patient regularly follows with Dr. Fuller Plan, his last colonoscopy was in 2013 with recommendations for repeat in 5 years due to history of adenomatous polyps.   Patient was recently seen in the ER on 04/03/16 for constant chest pain since the night before which radiated from the right side of his chest to the left. He had workup which included normal labs; BNP and CBC and EKG. Chest x-ray at that time showed cardiomegaly with mild pulmonary vascular prominence and bilateral interstitial prominence consistent with mild CHF. The patient improved on Pepcid and Maalox and was discharged. It was noted that he had a prior cardiac cath and CT previously for eval of chest pain which was negative.   Patient then had a CT of the abdomen and pelvis with contrast on 04/28/16 due to continuation of symptoms. This showed an absent gallbladder with no biliary duct dilatation. Ectatic aorta. No bowel wall thickening or bowel obstruction. Scattered colonic diverticula without diverticulitis. No abscess or ascites.   Today, the patient presents to clinic and tells me that 2 weeks ago he started with a "pressure" in his abdomen and it sounded like a "volcano" this came on out of the blue. The patient does not believe this was related to anything he was eating or drinking at the time. He went to his PCP and had labs which were "normal" per the patient. He continued with a lot of heartburn and pressure that went up into his chest. He felt like he had a lot of gas that he could not expel and felt like he had "bubbles in my stomach and it was turning". He returned to his PCP and  told him that he had chest pain. He was then sent to the ER for further evaluation. Evaluation was as above. He then had CT as above. Patient tells me that initially he was constipated and had taken a lot of laxatives, but he is now having daily stools which are a combination of gas and "looser stool". The patient continues his Protonix 40 twice a day and just restarted his fiber supplement in the past week. He also continues his probiotics. Patient tells me that currently all the symptoms are "way better" than they were a couple of weeks ago. In fact the patient feels as though he is on "the mend". He does have some lingering abdominal cramping at times.   Patient denies fever, chills, blood in his stool, melena, weight loss, fatigue, anorexia, nausea, vomiting or symptoms that awaken him at night.  Past Medical History:  Diagnosis Date  . Anxiety   . Back pain, chronic   . Colon polyp   . GERD (gastroesophageal reflux disease)   . Heart palpitations   . High cholesterol   . Hypertension   . Irritable bowel syndrome   . Osteoporosis   . Psoriasis     Past Surgical History:  Procedure Laterality Date  . BACK SURGERY  2015  . CHOLECYSTECTOMY N/A 10/12/2015   Procedure: LAPAROSCOPIC CHOLECYSTECTOMY;  Surgeon: Aviva Signs, MD;  Location: [facility];  Service: General;  Laterality: N/A;  . FOOT FRACTURE SURGERY  left foot  . KNEE ARTHROSCOPY     left    Current Outpatient Prescriptions  Medication Sig Dispense Refill  . ALPRAZolam (XANAX) 0.5 MG tablet Take 0.25-0.5 mg by mouth at bedtime as needed for sleep.     Marland Kitchen amoxicillin-clavulanate (AUGMENTIN) 875-125 MG tablet Take 1 tablet by mouth 2 (two) times daily. 10 day course starting on 11/03/2015    . aspirin EC 81 MG tablet Take 81 mg by mouth daily.    Marland Kitchen doxazosin (CARDURA) 4 MG tablet Take 4 mg by mouth at bedtime.      . folic acid (FOLVITE) 1 MG tablet Take 1 mg by mouth every evening.     Marland Kitchen HYDROcodone-acetaminophen (NORCO/VICODIN)  5-325 MG tablet Take 2 tablets by mouth every 4 (four) hours as needed. 10 tablet 0  . lisinopril (PRINIVIL,ZESTRIL) 10 MG tablet Take 10 mg by mouth every evening.     . methotrexate (RHEUMATREX) 2.5 MG tablet Take 5 mg by mouth once a week. Take 2 tab weekly on Wednesday Caution:Chemotherapy. Protect from light.    . metoprolol tartrate (LOPRESSOR) 25 MG tablet Take 1 tablet (25 mg total) by mouth 2 (two) times daily. 60 tablet 0  . mupirocin ointment (BACTROBAN) 2 % Apply 1 application topically 2 (two) times daily. Starting on 11/02/2015  0  . oxyCODONE-acetaminophen (PERCOCET/ROXICET) 5-325 MG tablet Take 1-2 tablets by mouth every 4 (four) hours as needed for moderate pain or severe pain. 50 tablet 0  . pantoprazole (PROTONIX) 40 MG tablet Take 1 tablet (40 mg total) by mouth 2 (two) times daily. (Patient taking differently: Take 40 mg by mouth every morning. ) 60 tablet 11  . pravastatin (PRAVACHOL) 20 MG tablet Take 20 mg by mouth every evening.     . Probiotic Product (PROBIOTIC PO) Take 1 capsule by mouth daily.    . hyoscyamine (LEVSIN SL) 0.125 MG SL tablet Place 1 tablet (0.125 mg total) under the tongue every 4 (four) hours as needed. 30 tablet 2   No current facility-administered medications for this visit.     Allergies as of 05/05/2016 - Review Complete 05/05/2016  Allergen Reaction Noted  . Cephalexin Other (See Comments) 03/07/2008  . Contrast media [iodinated diagnostic agents] Hives and Rash 09/17/2011    Family History  Problem Relation Age of Onset  . Arrhythmia Mother     Atrial fib  . Arrhythmia Sister     Atrial fib  . Stroke Sister   . Breast cancer Sister   . Bladder Cancer Father        . Colon cancer Neg Hx   . Stomach cancer Neg Hx     Social History   Social History  . Marital status: Married    Spouse name: N/A  . Number of children: 2  . Years of education: N/A   Occupational History  .      Retired   Social History Main Topics  .  Smoking status: Former Smoker    Packs/day: 2.00    Types: Cigarettes    Start date: 05/22/1962    Quit date: 05/21/1973  . Smokeless tobacco: Never Used     Comment: Quit 25 yrs now   . Alcohol use 8.4 oz/week    14 Shots of liquor per week     Comment: Rare   . Drug use: No  . Sexual activity: Yes   Other Topics Concern  . Not on file   Social History Narrative   Has  dog named Rockydooal    Review of Systems:    Constitutional: No weight loss, fever or chills Skin: No rash  Cardiovascular: Positive for chest pressure Respiratory: No SOB  Gastrointestinal: See HPI and otherwise negative   Physical Exam:  Vital signs: BP 108/62   Pulse (!) 56   Ht 5\' 11"  (1.803 m)   Wt 274 lb (124.3 kg)   BMI 38.22 kg/m   Constitutional:   Pleasant Overweight Caucasian male appears to be in NAD, Well developed, Well nourished, alert and cooperative Respiratory: Respirations even and unlabored. Lungs clear to auscultation bilaterally.   No wheezes, crackles, or rhonchi.  Cardiovascular: Normal S1, S2. No MRG. Regular rate and rhythm. No peripheral edema, cyanosis or pallor.  Gastrointestinal:  Soft, nondistended,mild epigastric ttp No rebound or guarding. Normal bowel sounds. No appreciable masses or hepatomegaly. Psychiatric: Demonstrates good judgement and reason without abnormal affect or behaviors.  RECENT LABS AND IMAGING: CBC    Component Value Date/Time   WBC 9.6 04/03/2016 0831   RBC 4.32 04/03/2016 0831   HGB 14.0 04/03/2016 0831   HCT 40.2 04/03/2016 0831   PLT 241 04/03/2016 0831   MCV 93.1 04/03/2016 0831   MCH 32.4 04/03/2016 0831   MCHC 34.8 04/03/2016 0831   RDW 13.1 04/03/2016 0831   LYMPHSABS 2.4 11/08/2015 1722   MONOABS 0.8 11/08/2015 1722   EOSABS 0.4 11/08/2015 1722   BASOSABS 0.0 11/08/2015 1722    CMP     Component Value Date/Time   NA 136 04/03/2016 0831   K 4.0 04/03/2016 0831   CL 104 04/03/2016 0831   CO2 23 04/03/2016 0831   GLUCOSE 117 (H)  04/03/2016 0831   BUN 20 04/03/2016 0831   CREATININE 1.14 04/03/2016 0831   CALCIUM 9.1 04/03/2016 0831   PROT 7.3 11/08/2015 1722   ALBUMIN 4.0 11/08/2015 1722   AST 18 11/08/2015 1722   ALT 23 11/08/2015 1722   ALKPHOS 68 11/08/2015 1722   BILITOT 0.9 11/08/2015 1722   GFRNONAA >60 04/03/2016 0831   GFRAA >60 04/03/2016 0831   EXAM: CT ABDOMEN WITHOUT CONTRAST 04/28/16  TECHNIQUE: Multidetector CT imaging of the abdomen was performed following the standard protocol without IV contrast. Oral contrast was administered.  COMPARISON:  December 15, 2014  FINDINGS: Lower chest: There is bibasilar lung scarring.  Hepatobiliary: No focal liver lesions are evident beyond occasional calcified granulomas on this noncontrast enhanced study. Gallbladder is absent. There is no inflammatory focus in the gallbladder fossa region. No biliary duct dilatation.  Pancreas: No pancreatic mass or inflammatory focus.  Spleen: No splenic lesions are evident.  Adrenals/Urinary Tract: Adrenals appear normal bilaterally. Kidneys bilaterally show no mass or hydronephrosis on either side. There is stable mild perinephric stranding on each side. There is no renal or proximal ureteral calculus on either side.  Stomach/Bowel: There are scattered colonic diverticula without diverticulitis. No bowel wall or mesenteric thickening. No evidence of bowel obstruction or free air.  Vascular/Lymphatic: There is atherosclerotic calcification in the aorta. Aorta is mildly ectatic. In the mid abdominal region, the maximum transverse diameter of the aorta is 2.9 x 2.4 cm. Major mesenteric vessels appear patent on this noncontrast enhanced study. There is moderate calcification in each proximal renal artery. No periaortic fluid. No abdominal or upper pelvic adenopathy evident.  Other: Most of the appendix is visualized and appears normal. No abscess or ascites noted in the  abdomen.  Musculoskeletal: Postoperative changes noted in the lower lumbar region. There are no  blastic or lytic bone lesions. No intramuscular or abdominal wall lesion.  IMPRESSION: Gallbladder absent.  No biliary duct dilatation.  Ectatic aorta. Maximum transverse diameter in the mid abdominal aorta is 2.9 x 2.4 cm. Ectatic abdominal aorta at risk for aneurysm development. Recommend followup by ultrasound in 5 years. This recommendation follows ACR consensus guidelines: White Paper of the ACR Incidental Findings Committee II on Vascular Findings. J Am Coll Radiol 2013; 10:789-794.  No bowel wall thickening or bowel obstruction. Scattered colonic diverticula without diverticulitis. Most of the appendix visualized and appears normal. No abscess or ascites. Postoperative change noted in lower lumbar spine.   Electronically Signed   By: Lowella Grip III M.D.   On: 04/28/2016 13:38  Assessment: 1. Abdominal pain: Acute onset 2 weeks ago associated with a lot of "stomach noises", initially patient had constipation which then changed to loose stool after laxative use as well as an increase in heartburn and reflux symptoms, CT abdomen pelvis with no acute abnormalities, labs were normal at that time, patient is now status post cholecystectomy last year, currently symptoms are improved; question viral etiology versus IBS versus other 2. Change in bowel habits: See above 3. GERD: Increase in symptoms recently with some associated chest pain, EKG was negative, troponin 0 in ER, currently controlled again with pantoprazole 40 mg twice a day  Plan: 1. Patient improving on probiotics, fiber and Pantoprazole 40 mg twice a day. Recommend that he continue these. 2. Patient is due a colonoscopy in September of this year with Dr. Fuller Plan. 3. Prescribed Hyoscyamine sulfate 0.125 mg every 4-6 hours for abdominal cramping #30 with 2 refills 4. Discussed with the patient that he should call  our office if he has an increase or worsening of symptoms after this point, he may then discuss an EGD and/or sooner colonoscopy pending symptoms 5. Patient to return to clinic as needed before time of colonoscopy  Ellouise Newer, PA-C [facility] 05/05/2016, 9:07 AM  Cc: Redmond School, MD

## 2016-05-05 NOTE — Progress Notes (Signed)
Reviewed and agree with initial management plan.  Suezette Lafave T. Milaina Sher, MD FACG 

## 2016-05-05 NOTE — Telephone Encounter (Signed)
Alexander Nunez sent Rx in today

## 2016-05-05 NOTE — Patient Instructions (Signed)
We have sent the following medications to your pharmacy for you to pick up at your convenience: Hyoscyamine 0.125 mg every 4-6 hours as needed for abdominal cramping

## 2016-05-05 NOTE — Progress Notes (Signed)
Cardiology Office Note   Date:  05/06/2016   ID:  Alexander Nunez [birth year], MRN [id number]  PCP:  Alexander Kilts, MD  Cardiologist:   Alexander Rouge, MD   No chief complaint on file.     History of Present Illness: Alexander Nunez is a 71 y.o. male who presents for evaluation/consultation for chest pain. Referred by Dr Alexander Nunez.  Seen by primary 04/03/16 and complained on pressure in diaphragm going around both sides. Noted for 3 days sudden onset. Squeezing subxiphoid with radiation to back.  Not related to exertion. Indicated pain during office visit 6/10  No associated constitutional symptoms, dyspnea palpitations or syncope. CRF;s elevated lipids and HTN on Rx  Noted relief in office with nitro and given ASA sent to ER  Reviewed ER visit and r/o labs ok CXR cardiomegaly with mild vascular prominence cannot exclude chronic ILD or pneumonitis. Given hours of pain with no ECG changes and negative troponin d/c home  He did see Dr Alexander Nunez 7 years ago with normal cardiolite. Has been on metoprolol in past for benign palpitations Echo 05/05/14 reviewed and normal with EF 60-65%   Seen by GI 05/05/16 CT done 04/28/16 with absent GB no bile duct dilatation or stones benign personally reviewed. Pain in ER improved wit maalox and pepcid Nunez for probiotics , fiber and pantoprazole 40 bid Hyoscyamine for cramps   Pain resolved now has seen Dr Alexander Nunez in [place] and will be observed for his IBS Activity limited by back pain has lumbar fusion but can walk on treadmill  Past Medical History:  Diagnosis Date  . Anxiety   . Back pain, chronic   . Colon polyp   . GERD (gastroesophageal reflux disease)   . Heart palpitations   . High cholesterol   . Hypertension   . Irritable bowel syndrome   . Osteoporosis   . Psoriasis     Past Surgical History:  Procedure Laterality Date  . BACK SURGERY  2015  . CHOLECYSTECTOMY N/A 10/12/2015   Procedure: LAPAROSCOPIC CHOLECYSTECTOMY;  Surgeon: Aviva Signs, MD;  Location: [facility];  Service: General;  Laterality: N/A;  . FOOT FRACTURE SURGERY     left foot  . KNEE ARTHROSCOPY     left     Current Outpatient Prescriptions  Medication Sig Dispense Refill  . ALPRAZolam (XANAX) 0.5 MG tablet Take 0.25-0.5 mg by mouth at bedtime as needed for sleep.     Marland Kitchen amoxicillin-clavulanate (AUGMENTIN) 875-125 MG tablet Take 1 tablet by mouth 2 (two) times daily. 10 day course starting on 11/03/2015    . aspirin EC 81 MG tablet Take 81 mg by mouth daily.    Marland Kitchen doxazosin (CARDURA) 4 MG tablet Take 4 mg by mouth at bedtime.      . folic acid (FOLVITE) 1 MG tablet Take 1 mg by mouth every evening.     Marland Kitchen HYDROcodone-acetaminophen (NORCO/VICODIN) 5-325 MG tablet Take 2 tablets by mouth every 4 (four) hours as needed. 10 tablet 0  . hyoscyamine (LEVSIN SL) 0.125 MG SL tablet Place 1 tablet (0.125 mg total) under the tongue every 4 (four) hours as needed. 30 tablet 2  . lisinopril (PRINIVIL,ZESTRIL) 10 MG tablet Take 10 mg by mouth every evening.     . methotrexate (RHEUMATREX) 2.5 MG tablet Take 5 mg by mouth once a week. Take 2 tab weekly on Wednesday Caution:Chemotherapy. Protect from light.    . metoprolol tartrate (LOPRESSOR) 25 MG tablet Take 1 tablet (  25 mg total) by mouth 2 (two) times daily. 60 tablet 0  . mupirocin ointment (BACTROBAN) 2 % Apply 1 application topically 2 (two) times daily. Starting on 11/02/2015  0  . oxyCODONE-acetaminophen (PERCOCET/ROXICET) 5-325 MG tablet Take 1-2 tablets by mouth every 4 (four) hours as needed for moderate pain or severe pain. 50 tablet 0  . pantoprazole (PROTONIX) 40 MG tablet Take 1 tablet (40 mg total) by mouth 2 (two) times daily. (Patient taking differently: Take 40 mg by mouth every morning. ) 60 tablet 11  . pravastatin (PRAVACHOL) 20 MG tablet Take 20 mg by mouth every evening.     . Probiotic Product (PROBIOTIC PO) Take 1 capsule by mouth daily.     No current facility-administered medications for this  visit.     Allergies:   Cephalexin and Contrast media [iodinated diagnostic agents]    Social History:  The patient  reports that he quit smoking about 42 years ago. His smoking use included Cigarettes. He started smoking about 53 years ago. He smoked 2.00 packs per day. He has never used smokeless tobacco. He reports that he drinks about 8.4 oz of alcohol per week . He reports that he does not use drugs.   Family History:  The patient's family history includes Arrhythmia in his mother and sister; Bladder Cancer in his father; Breast cancer in his sister; Stroke in his sister.    ROS:  Please see the history of present illness.   Otherwise, review of systems are positive for none.   All other systems are reviewed and negative.    PHYSICAL EXAM: VS:  BP 118/66 (BP Location: Right Arm)   Pulse 60   Ht 5\' 11"  (1.803 m)   Wt 272 lb (123.4 kg)   SpO2 96%   BMI 37.94 kg/m  , BMI Body mass index is 37.94 kg/m. Affect appropriate Overweight white male  HEENT: normal Neck supple with no adenopathy JVP normal no bruits no thyromegaly Lungs clear with no wheezing and good diaphragmatic motion Heart:  S1/S2 no murmur, no rub, gallop or click PMI normal Abdomen: benighn, BS positve, no tenderness, no AAA no bruit.  No HSM or HJR Distal pulses intact with no bruits No edema Neuro non-focal Skin warm and dry No muscular weakness    EKG:  04/03/16 NSR rate 64 normal isolated non significant Q wave lead 3   Recent Labs: 11/08/2015: ALT 23 04/03/2016: BUN 20; Creatinine, Ser 1.14; Hemoglobin 14.0; Platelets 241; Potassium 4.0; Sodium 136    Lipid Panel No results found for: CHOL, TRIG, HDL, CHOLHDL, VLDL, LDLCALC, LDLDIRECT    Wt Readings from Last 3 Encounters:  05/06/16 272 lb (123.4 kg)  05/05/16 274 lb (124.3 kg)  04/03/16 276 lb (125.2 kg)      Other studies Reviewed: Additional studies/ records that were reviewed today include: Notes from GI Dr Alexander Nunez and ER records with  labs CXR and ECG old notes Dr Alexander Nunez [facility].    ASSESSMENT AND Nunez:  1.  Chest Pain resolved likely GI etiology will do f/u exercise myovue  2. HTN: Well controlled.  Continue current medications and low sodium Dash type diet.   3. Cholesterol continue station labs with primar y 4. GI/GERD  Probiotic PPI f/u with Dr Alexander Nunez Abdominal CT ok  5. Lumbar:  Post fusion with residual L3 pain steroid injection 4/26   Current medicines are reviewed at length with the patient today.  The patient does not have concerns regarding medicines.  The following changes have been made:  no change  Labs/ tests ordered today include: Ex Myovue   Orders Placed This Encounter  Procedures  . NM Myocar Multi W/Spect W/Wall Motion / EF     Disposition:   FU with [place] as needed      Signed, Alexander Rouge, MD  05/06/2016 10:10 AM    [facility] [place], [place], [place]  [address] Phone: [phone]; Fax: [phone]

## 2016-05-06 ENCOUNTER — Encounter: Payer: Self-pay | Admitting: Cardiovascular Disease

## 2016-05-06 ENCOUNTER — Ambulatory Visit (INDEPENDENT_AMBULATORY_CARE_PROVIDER_SITE_OTHER): Payer: Medicare Other | Admitting: Cardiovascular Disease

## 2016-05-06 VITALS — BP 118/66 | HR 60 | Ht 71.0 in | Wt 272.0 lb

## 2016-05-06 DIAGNOSIS — R079 Chest pain, unspecified: Secondary | ICD-10-CM

## 2016-05-06 NOTE — Patient Instructions (Signed)
Medication Instructions:  Your physician recommends that you continue on your current medications as directed. Please refer to the Current Medication list given to you today.   Labwork: none  Testing/Procedures: Your physician has requested that you have en exercise stress myoview. For further information please visit [email or web address]. Please follow instruction sheet, as given.    Follow-Up: Your physician recommends that you schedule a follow-up appointment in: as needed    Any Other Special Instructions Will Be Listed Below (If Applicable).     If you need a refill on your cardiac medications before your next appointment, please call your pharmacy.   

## 2016-05-23 DIAGNOSIS — R944 Abnormal results of kidney function studies: Secondary | ICD-10-CM | POA: Diagnosis not present

## 2016-05-29 ENCOUNTER — Other Ambulatory Visit: Payer: Self-pay | Admitting: Nurse Practitioner

## 2016-05-29 ENCOUNTER — Ambulatory Visit
Admission: RE | Admit: 2016-05-29 | Discharge: 2016-05-29 | Disposition: A | Discharge status: Home / Self Care | Payer: Medicare Other | Source: Ambulatory Visit | Attending: Nurse Practitioner | Admitting: Nurse Practitioner

## 2016-05-29 DIAGNOSIS — M47816 Spondylosis without myelopathy or radiculopathy, lumbar region: Secondary | ICD-10-CM

## 2016-05-29 DIAGNOSIS — M5136 Other intervertebral disc degeneration, lumbar region: Secondary | ICD-10-CM

## 2016-05-29 DIAGNOSIS — M47817 Spondylosis without myelopathy or radiculopathy, lumbosacral region: Secondary | ICD-10-CM | POA: Diagnosis not present

## 2016-05-29 MED ORDER — IOPAMIDOL (ISOVUE-M 200) INJECTION 41%: 1.0000 mL | Freq: Once | INTRAMUSCULAR | Status: DC

## 2016-05-29 MED ORDER — METHYLPREDNISOLONE ACETATE 40 MG/ML INJ SUSP (RADIOLOG: 120.0000 mg | Freq: Once | INTRAMUSCULAR | Status: DC

## 2016-06-03 ENCOUNTER — Encounter (HOSPITAL_COMMUNITY)
Admission: RE | Admit: 2016-06-03 | Discharge: 2016-06-03 | Disposition: A | Discharge status: Home / Self Care | Payer: Medicare Other | Source: Ambulatory Visit | Attending: Cardiovascular Disease | Admitting: Cardiovascular Disease

## 2016-06-03 ENCOUNTER — Encounter (HOSPITAL_COMMUNITY): Payer: Self-pay

## 2016-06-03 ENCOUNTER — Encounter (HOSPITAL_COMMUNITY): Admission: RE | Admit: 2016-06-03 | Payer: Medicare Other | Source: Ambulatory Visit

## 2016-06-03 DIAGNOSIS — R079 Chest pain, unspecified: Secondary | ICD-10-CM

## 2016-06-03 MED ORDER — SODIUM CHLORIDE 0.9% FLUSH
INTRAVENOUS | Status: AC
  Filled 2016-06-03: qty 200

## 2016-06-04 ENCOUNTER — Encounter (HOSPITAL_BASED_OUTPATIENT_CLINIC_OR_DEPARTMENT_OTHER)
Admission: RE | Admit: 2016-06-04 | Discharge: 2016-06-04 | Disposition: A | Discharge status: Home / Self Care | Payer: Medicare Other | Source: Ambulatory Visit | Attending: Cardiovascular Disease | Admitting: Cardiovascular Disease

## 2016-06-04 ENCOUNTER — Encounter (HOSPITAL_COMMUNITY)
Admission: RE | Admit: 2016-06-04 | Discharge: 2016-06-04 | Disposition: A | Discharge status: Home / Self Care | Payer: Medicare Other | Source: Ambulatory Visit | Attending: Cardiovascular Disease | Admitting: Cardiovascular Disease

## 2016-06-04 ENCOUNTER — Encounter (HOSPITAL_COMMUNITY): Payer: Self-pay

## 2016-06-04 DIAGNOSIS — R079 Chest pain, unspecified: Secondary | ICD-10-CM | POA: Diagnosis not present

## 2016-06-04 LAB — NM MYOCAR MULTI W/SPECT W/WALL MOTION / EF
CHL CUP MPHR: 150 {beats}/min
CHL CUP NUCLEAR SSS: 1
CHL CUP RESTING HR STRESS: 68 {beats}/min
CSEPED: 6 min
Estimated workload: 7 METS
Exercise duration (sec): 16 s
LHR: 0.48
LV sys vol: 49 mL
LVDIAVOL: 109 mL (ref 62–150)
NUC STRESS TID: 1.25
Peak HR: 144 {beats}/min
Percent HR: 96 %
RPE: 13
SDS: 1
SRS: 0

## 2016-06-04 MED ORDER — SODIUM CHLORIDE 0.9% FLUSH
INTRAVENOUS | Status: AC
  Administered 2016-06-04: 10 mL via INTRAVENOUS
  Filled 2016-06-04: qty 10

## 2016-06-04 MED ORDER — TECHNETIUM TC 99M TETROFOSMIN IV KIT
10.0000 | PACK | Freq: Once | INTRAVENOUS | Status: AC | PRN
  Administered 2016-06-04: 11 via INTRAVENOUS

## 2016-06-04 MED ORDER — REGADENOSON 0.4 MG/5ML IV SOLN
INTRAVENOUS | Status: DC
Start: 2016-06-04 — End: 2016-06-04
  Filled 2016-06-04: qty 5

## 2016-06-04 MED ORDER — TECHNETIUM TC 99M TETROFOSMIN IV KIT
30.0000 | PACK | Freq: Once | INTRAVENOUS | Status: AC | PRN
  Administered 2016-06-04: 30 via INTRAVENOUS

## 2016-06-13 DIAGNOSIS — I77819 Aortic ectasia, unspecified site: Secondary | ICD-10-CM | POA: Diagnosis not present

## 2016-06-13 DIAGNOSIS — Z1389 Encounter for screening for other disorder: Secondary | ICD-10-CM | POA: Diagnosis not present

## 2016-06-13 DIAGNOSIS — I10 Essential (primary) hypertension: Secondary | ICD-10-CM | POA: Diagnosis not present

## 2016-06-13 DIAGNOSIS — E041 Nontoxic single thyroid nodule: Secondary | ICD-10-CM | POA: Diagnosis not present

## 2016-06-13 DIAGNOSIS — G4733 Obstructive sleep apnea (adult) (pediatric): Secondary | ICD-10-CM | POA: Diagnosis not present

## 2016-06-13 DIAGNOSIS — F419 Anxiety disorder, unspecified: Secondary | ICD-10-CM | POA: Diagnosis not present

## 2016-06-13 DIAGNOSIS — M545 Low back pain: Secondary | ICD-10-CM | POA: Diagnosis not present

## 2016-06-13 DIAGNOSIS — K635 Polyp of colon: Secondary | ICD-10-CM | POA: Diagnosis not present

## 2016-06-13 DIAGNOSIS — E784 Other hyperlipidemia: Secondary | ICD-10-CM | POA: Diagnosis not present

## 2016-06-13 DIAGNOSIS — Z6837 Body mass index (BMI) 37.0-37.9, adult: Secondary | ICD-10-CM | POA: Diagnosis not present

## 2016-06-13 DIAGNOSIS — R1011 Right upper quadrant pain: Secondary | ICD-10-CM | POA: Diagnosis not present

## 2016-06-13 DIAGNOSIS — L409 Psoriasis, unspecified: Secondary | ICD-10-CM | POA: Diagnosis not present

## 2016-08-01 ENCOUNTER — Other Ambulatory Visit: Payer: Self-pay | Admitting: Nurse Practitioner

## 2016-08-01 DIAGNOSIS — M5136 Other intervertebral disc degeneration, lumbar region: Secondary | ICD-10-CM

## 2016-08-04 DIAGNOSIS — L02221 Furuncle of abdominal wall: Secondary | ICD-10-CM | POA: Diagnosis not present

## 2016-08-04 DIAGNOSIS — Z6839 Body mass index (BMI) 39.0-39.9, adult: Secondary | ICD-10-CM | POA: Diagnosis not present

## 2016-08-04 DIAGNOSIS — Z1389 Encounter for screening for other disorder: Secondary | ICD-10-CM | POA: Diagnosis not present

## 2016-08-04 DIAGNOSIS — A4902 Methicillin resistant Staphylococcus aureus infection, unspecified site: Secondary | ICD-10-CM | POA: Diagnosis not present

## 2016-08-08 ENCOUNTER — Inpatient Hospital Stay
Admission: RE | Admit: 2016-08-08 | Discharge: 2016-08-08 | Disposition: A | Discharge status: Home / Self Care | Payer: Medicare Other | Source: Ambulatory Visit | Attending: Nurse Practitioner | Admitting: Nurse Practitioner

## 2016-08-20 DIAGNOSIS — Z6839 Body mass index (BMI) 39.0-39.9, adult: Secondary | ICD-10-CM | POA: Diagnosis not present

## 2016-08-20 DIAGNOSIS — Z1389 Encounter for screening for other disorder: Secondary | ICD-10-CM | POA: Diagnosis not present

## 2016-08-20 DIAGNOSIS — Z0000 Encounter for general adult medical examination without abnormal findings: Secondary | ICD-10-CM | POA: Diagnosis not present

## 2016-08-20 DIAGNOSIS — F419 Anxiety disorder, unspecified: Secondary | ICD-10-CM | POA: Diagnosis not present

## 2016-08-28 ENCOUNTER — Encounter: Payer: Self-pay | Admitting: Podiatry

## 2016-08-28 ENCOUNTER — Ambulatory Visit (INDEPENDENT_AMBULATORY_CARE_PROVIDER_SITE_OTHER): Payer: Medicare Other | Admitting: Podiatry

## 2016-08-28 ENCOUNTER — Ambulatory Visit (INDEPENDENT_AMBULATORY_CARE_PROVIDER_SITE_OTHER): Payer: Medicare Other

## 2016-08-28 ENCOUNTER — Other Ambulatory Visit: Payer: Self-pay | Admitting: Podiatry

## 2016-08-28 DIAGNOSIS — M722 Plantar fascial fibromatosis: Secondary | ICD-10-CM | POA: Diagnosis not present

## 2016-08-28 MED ORDER — DICLOFENAC SODIUM 75 MG PO TBEC: 75.0000 mg | DELAYED_RELEASE_TABLET | Freq: Two times a day (BID) | ORAL | 2 refills | Status: DC

## 2016-08-28 MED ORDER — TRIAMCINOLONE ACETONIDE 10 MG/ML IJ SUSP
10.0000 mg | Freq: Once | INTRAMUSCULAR | Status: AC
  Administered 2016-08-28: 10 mg

## 2016-08-28 NOTE — Progress Notes (Signed)
Subjective:    Patient ID: Alexander Nunez, male   DOB: 71 y.o.   MRN: [id number]   HPI patient states that he has started to develop a lot of pain in the plantar aspect of his heel right over left with inflammation fluid buildup around the medial band and inability to walk comfortably or 4 months. States it's been getting worse over the last 2-3 months    Review of Systems  All other systems reviewed and are negative.       Objective:  Physical Exam  Cardiovascular: Intact distal pulses.   Musculoskeletal: Normal range of motion.  Neurological: He is alert.  Skin: Skin is warm.  Nursing note and vitals reviewed.  neurovascular status intact muscle strength adequate range of motion within normal limits with patient noted to have exquisite discomfort plantar fascia right over left with inflammation fluid around the medial band. Patient's noted to have good digital perfusion and is well oriented 3 with mild depression of the arch     Assessment:    Acute plantar fasciitis heel right over left with fluid buildup     Plan:    H&P x-ray reviewed and today I injected the plantar fascia right 3 mg Kenalog 5 mill grams Xylocaine and applied fascial brace and instructed on physical therapy. Reappoint 2 weeks for review in May require other treatments  X-rays indicate minimal spur with no indication stress fracture arthritis

## 2016-08-28 NOTE — Progress Notes (Signed)
   Subjective:    Patient ID: Alexander Nunez, male    DOB: [birth year], 71 y.o.   MRN: [id number]  HPI  Chief Complaint  Patient presents with  . Foot Pain    Rt back heel and bottom onset 2-3 months getting worse       Review of Systems  HENT: Positive for tinnitus.   Musculoskeletal: Positive for arthralgias, back pain and gait problem.  All other systems reviewed and are negative.      Objective:   Physical Exam        Assessment & Plan:

## 2016-08-28 NOTE — Patient Instructions (Signed)

## 2016-08-28 NOTE — Telephone Encounter (Signed)
Pt will need to be reevaluated before 6 months of refills.

## 2016-08-29 ENCOUNTER — Ambulatory Visit
Admission: RE | Admit: 2016-08-29 | Discharge: 2016-08-29 | Disposition: A | Discharge status: Home / Self Care | Payer: Medicare Other | Source: Ambulatory Visit | Attending: Nurse Practitioner | Admitting: Nurse Practitioner

## 2016-08-29 ENCOUNTER — Other Ambulatory Visit: Payer: Self-pay | Admitting: Nurse Practitioner

## 2016-08-29 DIAGNOSIS — M545 Low back pain: Secondary | ICD-10-CM | POA: Diagnosis not present

## 2016-08-29 DIAGNOSIS — M5136 Other intervertebral disc degeneration, lumbar region: Secondary | ICD-10-CM

## 2016-08-29 MED ORDER — IOPAMIDOL (ISOVUE-M 200) INJECTION 41%
1.0000 mL | Freq: Once | INTRAMUSCULAR | Status: AC
  Administered 2016-08-29: 1 mL via EPIDURAL

## 2016-08-29 MED ORDER — METHYLPREDNISOLONE ACETATE 40 MG/ML INJ SUSP (RADIOLOG
120.0000 mg | Freq: Once | INTRAMUSCULAR | Status: AC
  Administered 2016-08-29: 120 mg via EPIDURAL

## 2016-09-08 DIAGNOSIS — N411 Chronic prostatitis: Secondary | ICD-10-CM | POA: Diagnosis not present

## 2016-09-08 DIAGNOSIS — N4340 Spermatocele of epididymis, unspecified: Secondary | ICD-10-CM | POA: Diagnosis not present

## 2016-09-11 ENCOUNTER — Encounter: Payer: Self-pay | Admitting: Podiatry

## 2016-09-11 ENCOUNTER — Ambulatory Visit (INDEPENDENT_AMBULATORY_CARE_PROVIDER_SITE_OTHER): Payer: Medicare Other | Admitting: Podiatry

## 2016-09-11 DIAGNOSIS — M722 Plantar fascial fibromatosis: Secondary | ICD-10-CM

## 2016-09-11 MED ORDER — TRIAMCINOLONE ACETONIDE 10 MG/ML IJ SUSP
10.0000 mg | Freq: Once | INTRAMUSCULAR | Status: AC
  Administered 2016-09-11: 10 mg

## 2016-09-11 NOTE — Progress Notes (Signed)
Subjective:    Patient ID: Alexander Nunez, male   DOB: 71 y.o.   MRN: [id number]   HPI patient states that he is still having pain in the right heel with moderate improvement    ROS      Objective:  Physical Exam neurovascular status intact with patient noted to have continued discomfort right plantar fashion insertional point tendon calcaneus with reduction of pain upon deep palpation     Assessment:    Plantar fasciitis right present but improved     Plan:    Reinjected the plantar fascia 3 Milligan Kenalog 5 mg Xylocaine advised on physical therapy and will see back if symptoms indicate

## 2016-09-15 DIAGNOSIS — J069 Acute upper respiratory infection, unspecified: Secondary | ICD-10-CM | POA: Diagnosis not present

## 2016-09-15 DIAGNOSIS — Z6839 Body mass index (BMI) 39.0-39.9, adult: Secondary | ICD-10-CM | POA: Diagnosis not present

## 2016-09-15 DIAGNOSIS — Z1389 Encounter for screening for other disorder: Secondary | ICD-10-CM | POA: Diagnosis not present

## 2016-09-26 IMAGING — NM NM HEPATO W/GB/PHARM/[PERSON_NAME]
2 series · 12 of 12 positions shown · non-contrast
Comparison: None

CLINICAL DATA: RIGHT upper quadrant and epigastric abdominal pain
radiating to back for 2 months, some nausea without vomiting

EXAM:
NUCLEAR MEDICINE HEPATOBILIARY IMAGING WITH GALLBLADDER EF
TECHNIQUE: Sequential images of the abdomen were obtained [DATE] minutes
following intravenous administration of radiopharmaceutical. After
slow intravenous infusion of 2.47 micrograms Cholecystokinin,
gallbladder ejection fraction was determined.
RADIOPHARMACEUTICALS:  5.5 mCi Ic-HHm Choletec IV

[Series 1: biliary · 3.25mm/px · 6 of 60 frames shown]
[frame 6/60]
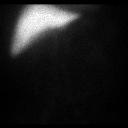
[frame 16/60]
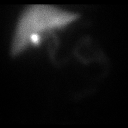
[frame 26/60]
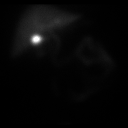
[frame 36/60]
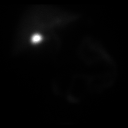
[frame 46/60]
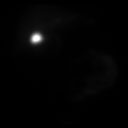
[frame 56/60]
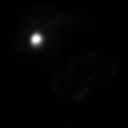

[Series 2: gbef · 3.25mm/px · 6 of 60 frames shown]
[frame 6/60]
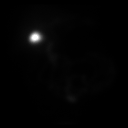
[frame 16/60]
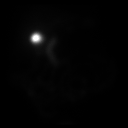
[frame 26/60]
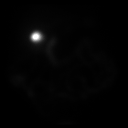
[frame 36/60]
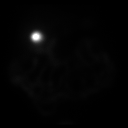
[frame 46/60]
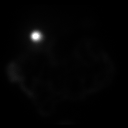
[frame 56/60]
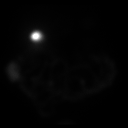

[12 of 12 positions shown; findings below may reference images not displayed]

FINDINGS: Normal tracer extraction from bloodstream indicating normal
hepatocellular function.

Normal excretion of tracer into biliary tree.

Gallbladder visualized at 12 min.

Small bowel visualized at 10 min.

No hepatic retention of tracer.

Subjectively decreased emptying of tracer from gallbladder following
CCK administration.

Calculated gallbladder ejection fraction is 26%, below the normal
range..

Patient reported no symptoms following CCK administration.
IMPRESSION: Patent biliary tree.

Abnormal gallbladder response to CCK stimulation with a decreased
gallbladder ejection fraction of 26%.

## 2016-09-29 DIAGNOSIS — D229 Melanocytic nevi, unspecified: Secondary | ICD-10-CM | POA: Diagnosis not present

## 2016-09-29 DIAGNOSIS — L409 Psoriasis, unspecified: Secondary | ICD-10-CM | POA: Diagnosis not present

## 2016-09-29 DIAGNOSIS — R202 Paresthesia of skin: Secondary | ICD-10-CM | POA: Diagnosis not present

## 2016-09-30 DIAGNOSIS — Z6838 Body mass index (BMI) 38.0-38.9, adult: Secondary | ICD-10-CM | POA: Diagnosis not present

## 2016-09-30 DIAGNOSIS — H538 Other visual disturbances: Secondary | ICD-10-CM | POA: Diagnosis not present

## 2016-09-30 DIAGNOSIS — H2513 Age-related nuclear cataract, bilateral: Secondary | ICD-10-CM | POA: Diagnosis not present

## 2016-10-09 DIAGNOSIS — H6092 Unspecified otitis externa, left ear: Secondary | ICD-10-CM | POA: Diagnosis not present

## 2016-10-09 DIAGNOSIS — Z6838 Body mass index (BMI) 38.0-38.9, adult: Secondary | ICD-10-CM | POA: Diagnosis not present

## 2016-10-09 DIAGNOSIS — H6120 Impacted cerumen, unspecified ear: Secondary | ICD-10-CM | POA: Diagnosis not present

## 2016-10-16 DIAGNOSIS — H538 Other visual disturbances: Secondary | ICD-10-CM | POA: Diagnosis not present

## 2016-10-16 DIAGNOSIS — H539 Unspecified visual disturbance: Secondary | ICD-10-CM | POA: Diagnosis not present

## 2016-11-18 ENCOUNTER — Encounter: Payer: Self-pay | Admitting: Gastroenterology

## 2016-11-18 DIAGNOSIS — H2513 Age-related nuclear cataract, bilateral: Secondary | ICD-10-CM | POA: Diagnosis not present

## 2016-11-23 IMAGING — US US AORTA
1 series · 14 of 21 positions shown · non-contrast
Comparison: 01/18/2015

CLINICAL DATA: Followup for abdominal aortic aneurysm, measuring
3.2 cm on the previous ultrasound.

EXAM:
ULTRASOUND OF ABDOMINAL AORTA
TECHNIQUE: Ultrasound examination of the abdominal aorta was performed to
evaluate for abdominal aortic aneurysm.

[Series 1: us aorta · 0.27mm/px · 14 of 21 slices shown]
[im 1/21]
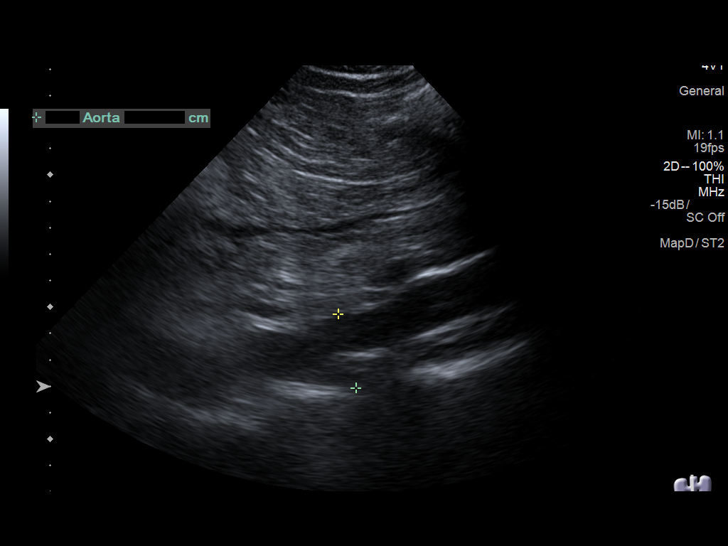
[im 3/21]
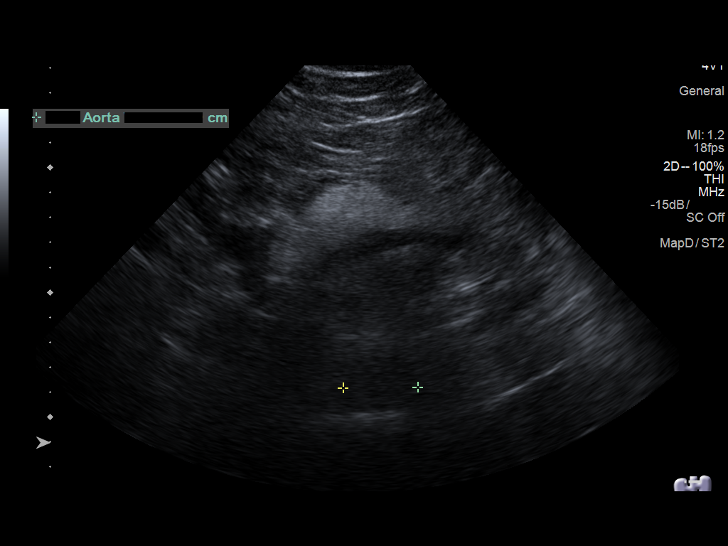
[im 4/21]
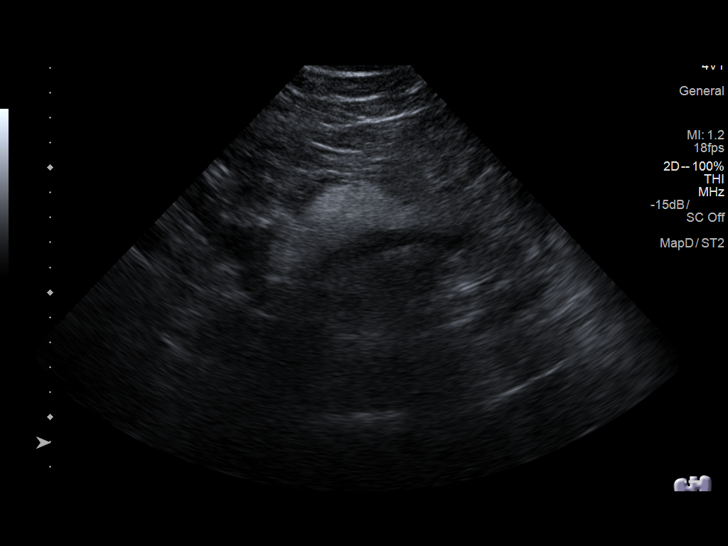
[im 6/21]
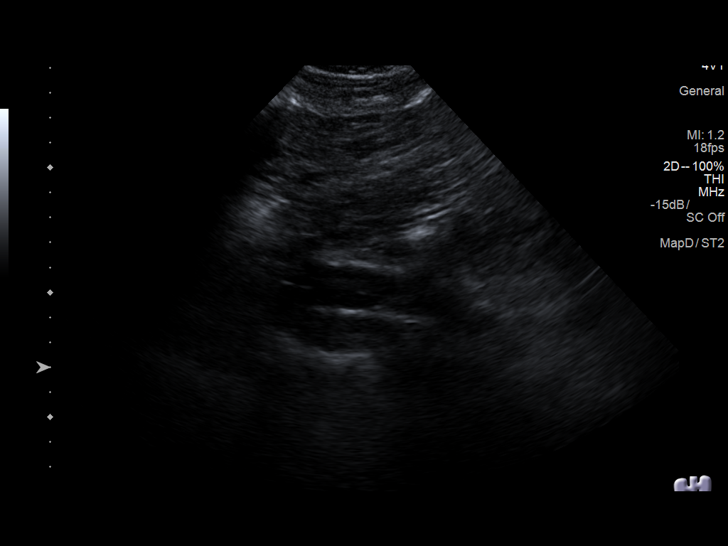
[im 7/21]
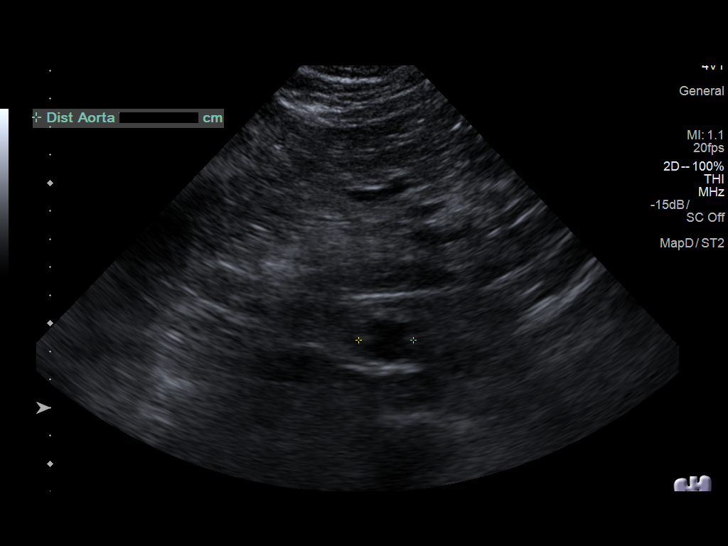
[im 9/21]
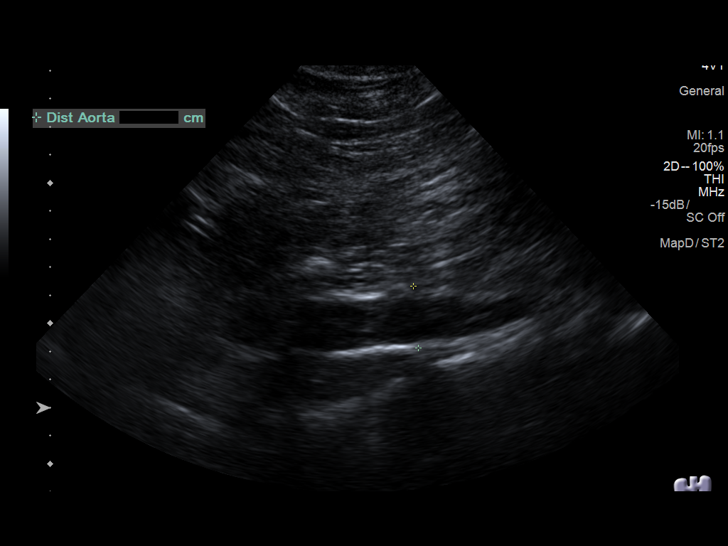
[im 10/21]
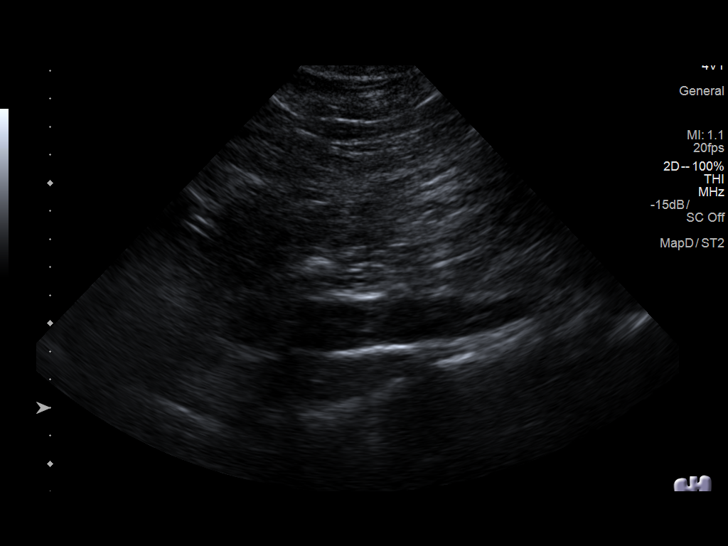
[im 12/21]
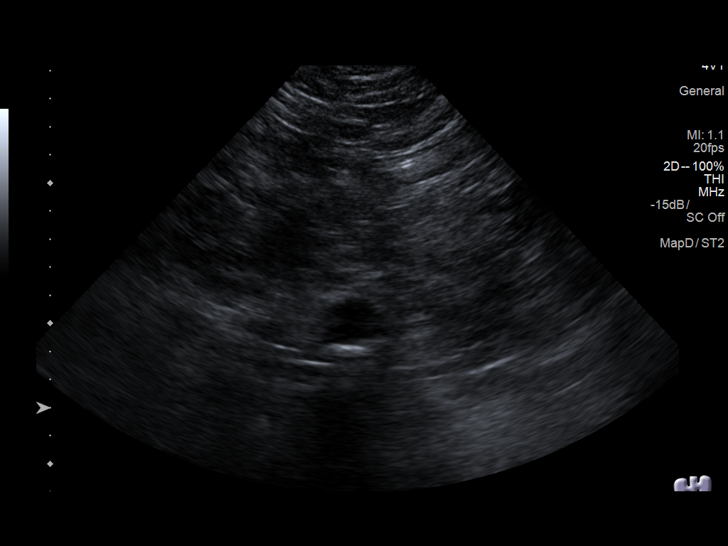
[im 13/21]
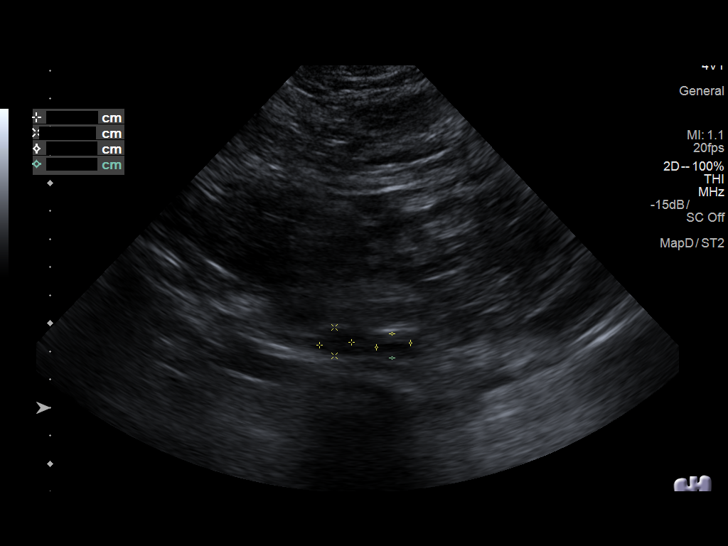
[im 15/21]
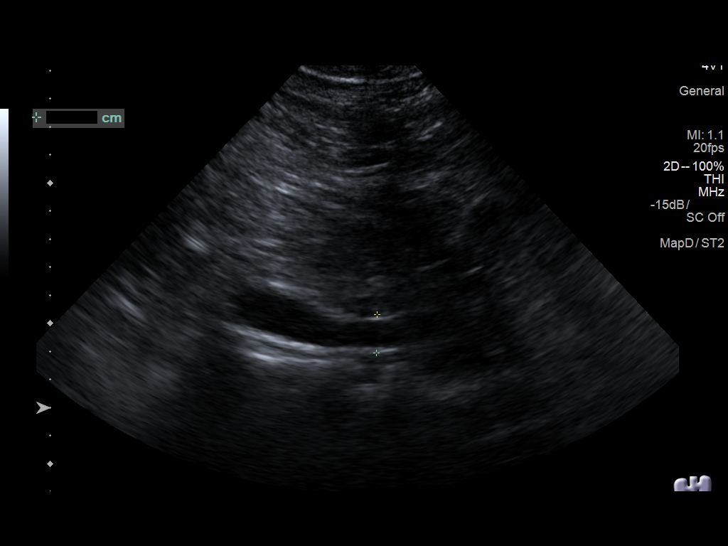
[im 16/21]
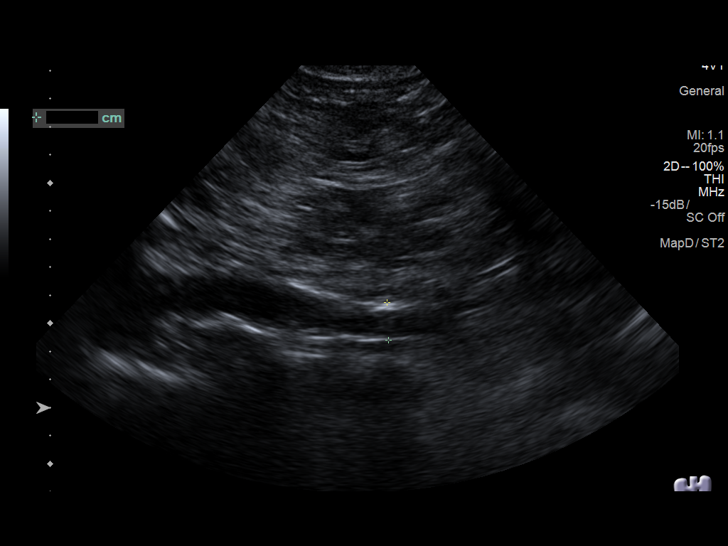
[im 18/21]
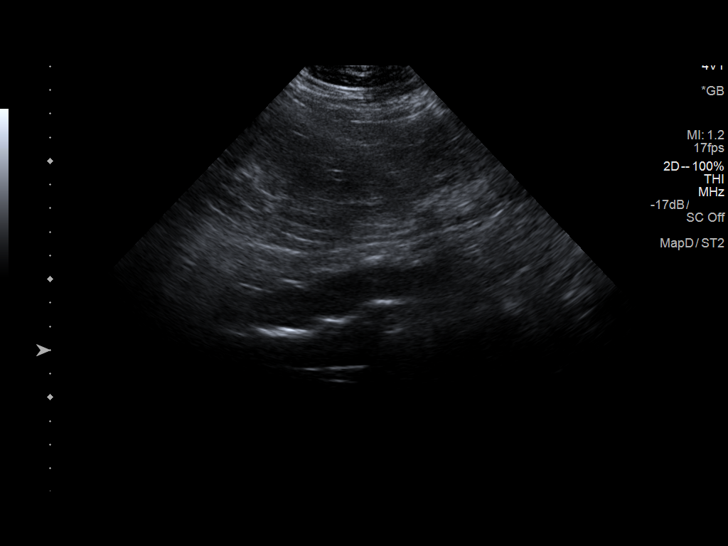
[im 19/21]
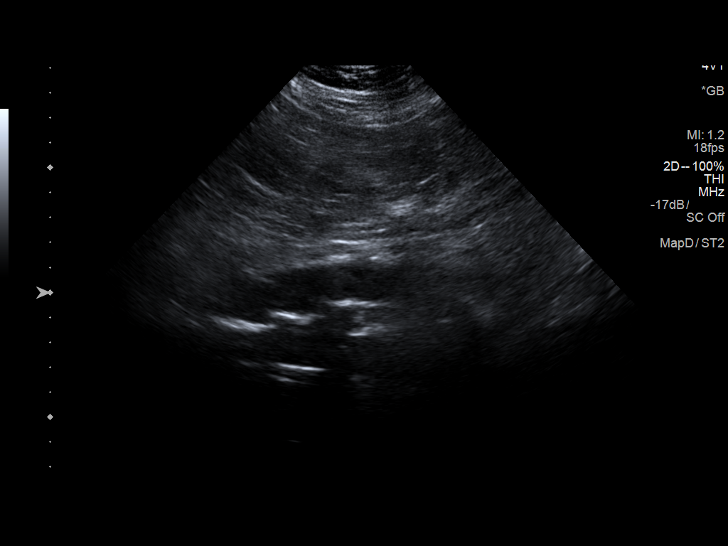
[im 21/21]
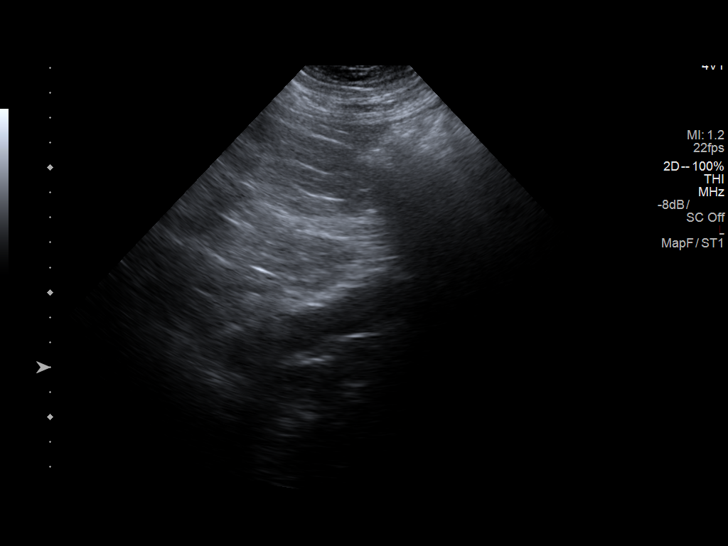

[14 of 21 positions shown; findings below may reference images not displayed]

FINDINGS: Abdominal Aorta

No aneurysm identified.

Maximum Diameter: Proximally 2.9 x 3.0 cm, midportion 2.3 x 2.0 cm,
distally 2.2 cm x 2.3 cm.
IMPRESSION: 1. As noted on the recent prior ultrasound, the proximal abdominal
aorta is prominent, measuring 3 cm in greatest diameter on
ultrasound. There is no fusiform/ focal dilation of the aorta to
indicate an aneurysm. Aorta gradually decreases in diameter
throughout its length.

## 2016-11-27 ENCOUNTER — Other Ambulatory Visit: Payer: Self-pay | Admitting: Podiatry

## 2016-11-28 ENCOUNTER — Other Ambulatory Visit: Payer: Self-pay | Admitting: Podiatry

## 2016-11-28 NOTE — Telephone Encounter (Signed)
Pt needs an appt prior to future refills. 

## 2016-12-09 DIAGNOSIS — M47816 Spondylosis without myelopathy or radiculopathy, lumbar region: Secondary | ICD-10-CM | POA: Diagnosis not present

## 2016-12-09 DIAGNOSIS — M5416 Radiculopathy, lumbar region: Secondary | ICD-10-CM | POA: Diagnosis not present

## 2016-12-10 ENCOUNTER — Ambulatory Visit (INDEPENDENT_AMBULATORY_CARE_PROVIDER_SITE_OTHER): Payer: Medicare Other

## 2016-12-10 ENCOUNTER — Other Ambulatory Visit: Payer: Self-pay | Admitting: Podiatry

## 2016-12-10 ENCOUNTER — Ambulatory Visit (INDEPENDENT_AMBULATORY_CARE_PROVIDER_SITE_OTHER): Payer: Medicare Other | Admitting: Podiatry

## 2016-12-10 ENCOUNTER — Encounter: Payer: Self-pay | Admitting: Podiatry

## 2016-12-10 DIAGNOSIS — M722 Plantar fascial fibromatosis: Secondary | ICD-10-CM | POA: Diagnosis not present

## 2016-12-10 DIAGNOSIS — M25571 Pain in right ankle and joints of right foot: Secondary | ICD-10-CM

## 2016-12-10 MED ORDER — TRIAMCINOLONE ACETONIDE 10 MG/ML IJ SUSP
10.0000 mg | Freq: Once | INTRAMUSCULAR | Status: AC
  Administered 2016-12-10: 10 mg

## 2016-12-10 NOTE — Progress Notes (Signed)
Subjective:    Patient ID: Alexander Nunez, male   DOB: 71 y.o.   MRN: [id number]   HPI patient presents stating he's having severe pain in his right heel and it's not responded    ROS      Objective:  Physical Exam neurovascular status intact with exquisite discomfort right plantar heel at the insertional point tendon calcaneus with inflammation and fluid buildup noted     Assessment:    Acute plantar fasciitis right with inflammation fluid around the medial band     Plan:    H&P condition reviewed and injected the plantar fascia 3 mg Kenalog 5 mill grams Xylocaine and then due to intensity of discomfort I did apply an air fracture walker to completely immobilize the plantar heel. Reappoint to recheck

## 2016-12-11 ENCOUNTER — Other Ambulatory Visit: Payer: Self-pay | Admitting: Nurse Practitioner

## 2016-12-11 DIAGNOSIS — M47816 Spondylosis without myelopathy or radiculopathy, lumbar region: Secondary | ICD-10-CM

## 2016-12-17 ENCOUNTER — Ambulatory Visit (INDEPENDENT_AMBULATORY_CARE_PROVIDER_SITE_OTHER): Payer: Medicare Other | Admitting: Physician Assistant

## 2016-12-17 ENCOUNTER — Encounter: Payer: Self-pay | Admitting: Physician Assistant

## 2016-12-17 ENCOUNTER — Other Ambulatory Visit (INDEPENDENT_AMBULATORY_CARE_PROVIDER_SITE_OTHER): Payer: Medicare Other

## 2016-12-17 ENCOUNTER — Telehealth: Payer: Self-pay | Admitting: Emergency Medicine

## 2016-12-17 VITALS — BP 104/60 | HR 60 | Ht 71.0 in | Wt 274.0 lb

## 2016-12-17 DIAGNOSIS — Z86010 Personal history of colonic polyps: Secondary | ICD-10-CM | POA: Diagnosis not present

## 2016-12-17 DIAGNOSIS — R1011 Right upper quadrant pain: Secondary | ICD-10-CM

## 2016-12-17 DIAGNOSIS — Z7901 Long term (current) use of anticoagulants: Secondary | ICD-10-CM | POA: Diagnosis not present

## 2016-12-17 LAB — CBC WITH DIFFERENTIAL/PLATELET
Basophils Absolute: 0.1 10*3/uL (ref 0.0–0.1)
Basophils Relative: 0.9 % (ref 0.0–3.0)
EOS PCT: 2.2 % (ref 0.0–5.0)
Eosinophils Absolute: 0.2 10*3/uL (ref 0.0–0.7)
HEMATOCRIT: 42.3 % (ref 39.0–52.0)
HEMOGLOBIN: 14.3 g/dL (ref 13.0–17.0)
LYMPHS PCT: 22.8 % (ref 12.0–46.0)
Lymphs Abs: 2 10*3/uL (ref 0.7–4.0)
MCHC: 33.8 g/dL (ref 30.0–36.0)
MCV: 97 fl (ref 78.0–100.0)
MONO ABS: 0.8 10*3/uL (ref 0.1–1.0)
MONOS PCT: 9.3 % (ref 3.0–12.0)
Neutro Abs: 5.8 10*3/uL (ref 1.4–7.7)
Neutrophils Relative %: 64.8 % (ref 43.0–77.0)
Platelets: 263 10*3/uL (ref 150.0–400.0)
RBC: 4.36 Mil/uL (ref 4.22–5.81)
RDW: 13.1 % (ref 11.5–15.5)
WBC: 8.9 10*3/uL (ref 4.0–10.5)

## 2016-12-17 LAB — COMPREHENSIVE METABOLIC PANEL
ALBUMIN: 4.2 g/dL (ref 3.5–5.2)
ALK PHOS: 58 U/L (ref 39–117)
ALT: 17 U/L (ref 0–53)
AST: 15 U/L (ref 0–37)
BUN: 20 mg/dL (ref 6–23)
CALCIUM: 9.4 mg/dL (ref 8.4–10.5)
CO2: 29 mEq/L (ref 19–32)
Chloride: 102 mEq/L (ref 96–112)
Creatinine, Ser: 1.22 mg/dL (ref 0.40–1.50)
GFR: 62.3 mL/min (ref >60.00)
Glucose, Bld: 102 mg/dL — ABNORMAL HIGH (ref 70–99)
POTASSIUM: 4.4 meq/L (ref 3.5–5.1)
Sodium: 137 mEq/L (ref 135–145)
TOTAL PROTEIN: 7.4 g/dL (ref 6.0–8.3)
Total Bilirubin: 0.8 mg/dL (ref 0.2–1.2)

## 2016-12-17 MED ORDER — NA SULFATE-K SULFATE-MG SULF 17.5-3.13-1.6 GM/177ML PO SOLN: 1.0000 | ORAL | 0 refills | Status: DC

## 2016-12-17 NOTE — Patient Instructions (Signed)
Your physician has requested that you go to the basement for lab work before leaving today.  You have been scheduled for a colonoscopy. Please follow written instructions given to you at your visit today.  Please pick up your prep supplies at the pharmacy within the next 1-3 days. If you use inhalers (even only as needed), please bring them with you on the day of your procedure. Your physician has requested that you go to www.startemmi.com and enter the access code given to you at your visit today. This web site gives a general overview about your procedure. However, you should still follow specific instructions given to you by our office regarding your preparation for the procedure.   

## 2016-12-17 NOTE — Progress Notes (Signed)
Chief Complaint: Right upper quadrant abdominal pain, history of adenomatous polyps  HPI:    Mr. Brodowski is a 71 year old Caucasian male with a past medical history as listed below, who returns to clinic today to discuss his screening colonoscopy as well as right upper quadrant pain.      Patient follows with Dr. Fuller Plan.  His last colonoscopy was in 2013 with recommendations to repeat in 5 years due to history of adenomatous polyps.    Today, the patient presents to clinic and tells me that he was recently started on Plavix approximately a month and a half ago.  Patient describes that he had blurred vision and went to his eye doctor who told him it was possibly a circulatory problem.  He then had an ultrasound of his carotid arteries which were normal and a brain CT which was also normal.  It sounds as though he was preventatively placed on the Plavix for 6 months by Dr. Hilma Favors.    Patient also describes today that he has started with a right upper quadrant pain which is intermittent and "has waves of intensity", over the past 2 weeks.  This is somehwat worse when he is moving around. Patient tells me that he has also noticed a lot of excess gas in his system and has been "chewing Gas-X like there candy".  Patient tells me this seems to help some and that when he does burp or expel gas it makes his pain slightly better.  Patient does describe good bowel movements recently with a solid bowel movement on a daily basis.    Patient denies any activity which could have brought on this right sided pain such as pulling or lifting or coughing.  He denies any fever, chills, blood in stool, melena, weight loss, anorexia, nausea, vomiting, heartburn or reflux.  Past Medical History:  Diagnosis Date  . Anxiety   . Back pain, chronic   . Colon polyp   . GERD (gastroesophageal reflux disease)   . Heart palpitations   . High cholesterol   . Hypertension   . Irritable bowel syndrome   . Osteoporosis   .  Psoriasis     Past Surgical History:  Procedure Laterality Date  . BACK SURGERY  2015  . FOOT FRACTURE SURGERY     left foot  . KNEE ARTHROSCOPY     left    Current Outpatient Medications  Medication Sig Dispense Refill  . ALPRAZolam (XANAX) 0.5 MG tablet Take 0.25-0.5 mg by mouth at bedtime as needed for sleep.     Marland Kitchen aspirin EC 81 MG tablet Take 81 mg by mouth daily.    . clopidogrel (PLAVIX) 75 MG tablet Take 75 mg daily by mouth.    . diclofenac (VOLTAREN) 75 MG EC tablet TAKE 1 TABLET BY MOUTH TWICE DAILY 90 tablet 0  . doxazosin (CARDURA) 4 MG tablet Take 4 mg by mouth at bedtime.      . folic acid (FOLVITE) 1 MG tablet Take 1 mg by mouth every evening.     . hyoscyamine (LEVSIN SL) 0.125 MG SL tablet Place 1 tablet (0.125 mg total) under the tongue every 4 (four) hours as needed. 30 tablet 2  . lisinopril (PRINIVIL,ZESTRIL) 10 MG tablet Take by mouth every evening. Takes 20 mg daily    . methotrexate (RHEUMATREX) 2.5 MG tablet Take 5 mg by mouth once a week. Take 2 tab weekly on Wednesday Caution:Chemotherapy. Protect from light.    . metoprolol tartrate (LOPRESSOR)  25 MG tablet Take 1 tablet (25 mg total) by mouth 2 (two) times daily. 60 tablet 0  . oxyCODONE-acetaminophen (PERCOCET/ROXICET) 5-325 MG tablet Take 1-2 tablets by mouth every 4 (four) hours as needed for moderate pain or severe pain. 50 tablet 0  . pantoprazole (PROTONIX) 40 MG tablet Take 1 tablet (40 mg total) by mouth 2 (two) times daily. (Patient taking differently: Take 40 mg by mouth every morning. ) 60 tablet 11  . pravastatin (PRAVACHOL) 20 MG tablet Take 20 mg by mouth every evening.     . Probiotic Product (PROBIOTIC PO) Take 1 capsule by mouth daily.     No current facility-administered medications for this visit.     Allergies as of 12/17/2016 - Review Complete 12/17/2016  Allergen Reaction Noted  . Cephalexin Other (See Comments) 03/07/2008  . Contrast media [iodinated diagnostic agents] Hives and  Rash 09/17/2011    Family History  Problem Relation Age of Onset  . Arrhythmia Mother        Atrial fib  . Arrhythmia Sister        Atrial fib  . Stroke Sister   . Breast cancer Sister   . Bladder Cancer Father           . Colon cancer Neg Hx   . Stomach cancer Neg Hx     Social History   Socioeconomic History  . Marital status: Married    Spouse name: Not on file  . Number of children: 2  . Years of education: Not on file  . Highest education level: Not on file  Social Needs  . Financial resource strain: Not on file  . Food insecurity - worry: Not on file  . Food insecurity - inability: Not on file  . Transportation needs - medical: Not on file  . Transportation needs - non-medical: Not on file  Occupational History    Comment: Retired  Tobacco Use  . Smoking status: Former Smoker    Packs/day: 2.00    Types: Cigarettes    Start date: 05/22/1962    Last attempt to quit: 05/21/1973    Years since quitting: 43.6  . Smokeless tobacco: Never Used  . Tobacco comment: Quit 25 yrs now   Substance and Sexual Activity  . Alcohol use: Yes    Alcohol/week: 8.4 oz    Types: 14 Shots of liquor per week    Comment: Rare   . Drug use: No  . Sexual activity: Yes    Partners: Female  Other Topics Concern  . Not on file  Social History Narrative   Has dog named Rockydooal    Review of Systems:    Constitutional: No weight loss, fever or chills Cardiovascular: No chest pain Respiratory: No SOB Gastrointestinal: See HPI and otherwise negative   Physical Exam:  Vital signs: BP 104/60   Pulse 60   Ht 5\' 11"  (1.803 m)   Wt 274 lb (124.3 kg)   BMI 38.22 kg/m   Constitutional:   Pleasant overweight Caucasian male appears to be in NAD, Well developed, Well nourished, alert and cooperative Respiratory: Respirations even and unlabored. Lungs clear to auscultation bilaterally.   No wheezes, crackles, or rhonchi.  Cardiovascular: Normal S1, S2. No MRG. Regular rate and  rhythm. No peripheral edema, cyanosis or pallor.  Gastrointestinal:  Soft, nondistended, Mild RUQ ttp. No rebound or guarding. Normal bowel sounds. No appreciable masses or hepatomegaly. Musculoskeletal: pinpoint pain over ribs on bottom right  Psychiatric:  Demonstrates good judgement  and reason without abnormal affect or behaviors.  No recent labs or imaging.  Assessment: 1.  History of adenomatous colon polyps: Last colonoscopy in 2013 with recommendation to repeat in 5 years, patient is due now 2.  Anticoagulation: For recent blurred vision per the patient, he has been on this a month and a half and is instructed to take it for 6 months 3.  Right upper quadrant pain: For the past 2 weeks per the patient, he is status post cholecystectomy, has noticed an increase in gas in his system but is also tender over his ribs, no change in bowel habits; consider gas plus/minus adhesions or costochondritis  Plan: 1.  Discussed patient's right upper quadrant pain, this could be related to gas buildup in adhesions in this area after cholecystectomy at the beginning of this year versus possible costochondritis. 2.  Recommend the patient use warm compresses for 10-15 minutes at a time multiple times a day to see if this helps the pain.  Did discuss that it was costochondritis it can take 6-8 weeks to feel better. 3.  Recommend the patient uses Gas-X before meals instead of after to help prevent gas buildup. 4.  Went ahead and schedule patient for a screening colonoscopy with Dr. Fuller Plan in the [facility] in January.  Did discuss risks, benefits, limitations and alternatives and the patient agrees to proceed. 5.  We will contact Dr. Hilma Favors to assess whether or not patient is able to hold his Plavix for 5 days prior to time of his procedure. 6.  Order labs to include a CBC and CMP 7.  Did discuss the patient that if his right upper quadrant pain gets worse we could do a right upper quadrant ultrasound for further  assessment. 8.  Patient to follow in clinic with me or Dr. Fuller Plan recommendations after above.  Ellouise Newer, PA-C [facility] 12/17/2016, 9:53 AM  Cc: Sharilyn Sites, MD

## 2016-12-17 NOTE — Telephone Encounter (Signed)
12/17/2016   RE: Alexander Nunez DOB: [birth year] MRN: [id number]   Dear Dr. Hilma Favors,    We have scheduled the above patient for an endoscopic procedure. Our records show that he is on anticoagulation therapy.   Please advise as to how long the patient may come off his therapy of Plavix prior to the procedure, which is scheduled for 02-20-17.  Please fax back or route to [facility], [place].  Sincerely,    Tinnie Gens, [place]

## 2016-12-17 NOTE — Progress Notes (Signed)
Reviewed and agree with initial management plan. Await input from Dr. Hilma Favors about hold on Plavix.   Pricilla Riffle. Fuller Plan, MD [facility]

## 2016-12-19 DIAGNOSIS — Z6839 Body mass index (BMI) 39.0-39.9, adult: Secondary | ICD-10-CM | POA: Diagnosis not present

## 2016-12-19 DIAGNOSIS — R1011 Right upper quadrant pain: Secondary | ICD-10-CM | POA: Diagnosis not present

## 2016-12-19 DIAGNOSIS — Z23 Encounter for immunization: Secondary | ICD-10-CM | POA: Diagnosis not present

## 2016-12-19 DIAGNOSIS — K589 Irritable bowel syndrome without diarrhea: Secondary | ICD-10-CM | POA: Diagnosis not present

## 2016-12-22 ENCOUNTER — Other Ambulatory Visit (HOSPITAL_COMMUNITY): Payer: Self-pay | Admitting: Internal Medicine

## 2016-12-22 ENCOUNTER — Other Ambulatory Visit (HOSPITAL_COMMUNITY): Payer: Self-pay | Admitting: Family Medicine

## 2016-12-22 DIAGNOSIS — F17200 Nicotine dependence, unspecified, uncomplicated: Secondary | ICD-10-CM

## 2016-12-22 DIAGNOSIS — R109 Unspecified abdominal pain: Secondary | ICD-10-CM

## 2016-12-22 DIAGNOSIS — M5414 Radiculopathy, thoracic region: Secondary | ICD-10-CM | POA: Diagnosis not present

## 2016-12-22 DIAGNOSIS — I10 Essential (primary) hypertension: Secondary | ICD-10-CM | POA: Diagnosis not present

## 2016-12-22 DIAGNOSIS — K219 Gastro-esophageal reflux disease without esophagitis: Secondary | ICD-10-CM | POA: Diagnosis not present

## 2016-12-22 DIAGNOSIS — M47816 Spondylosis without myelopathy or radiculopathy, lumbar region: Secondary | ICD-10-CM | POA: Diagnosis not present

## 2016-12-22 DIAGNOSIS — Z87891 Personal history of nicotine dependence: Secondary | ICD-10-CM

## 2016-12-22 DIAGNOSIS — Z6838 Body mass index (BMI) 38.0-38.9, adult: Secondary | ICD-10-CM | POA: Diagnosis not present

## 2016-12-22 DIAGNOSIS — E669 Obesity, unspecified: Secondary | ICD-10-CM | POA: Diagnosis not present

## 2016-12-22 DIAGNOSIS — Z1389 Encounter for screening for other disorder: Secondary | ICD-10-CM | POA: Diagnosis not present

## 2016-12-22 DIAGNOSIS — R1011 Right upper quadrant pain: Secondary | ICD-10-CM | POA: Diagnosis not present

## 2016-12-22 DIAGNOSIS — K589 Irritable bowel syndrome without diarrhea: Secondary | ICD-10-CM | POA: Diagnosis not present

## 2016-12-23 DIAGNOSIS — E7849 Other hyperlipidemia: Secondary | ICD-10-CM | POA: Diagnosis not present

## 2016-12-23 DIAGNOSIS — Z6837 Body mass index (BMI) 37.0-37.9, adult: Secondary | ICD-10-CM | POA: Diagnosis not present

## 2016-12-23 DIAGNOSIS — R1011 Right upper quadrant pain: Secondary | ICD-10-CM | POA: Diagnosis not present

## 2016-12-23 DIAGNOSIS — D126 Benign neoplasm of colon, unspecified: Secondary | ICD-10-CM | POA: Diagnosis not present

## 2016-12-23 DIAGNOSIS — K635 Polyp of colon: Secondary | ICD-10-CM | POA: Diagnosis not present

## 2016-12-23 DIAGNOSIS — K219 Gastro-esophageal reflux disease without esophagitis: Secondary | ICD-10-CM | POA: Diagnosis not present

## 2016-12-23 DIAGNOSIS — I10 Essential (primary) hypertension: Secondary | ICD-10-CM | POA: Diagnosis not present

## 2016-12-23 DIAGNOSIS — N411 Chronic prostatitis: Secondary | ICD-10-CM | POA: Diagnosis not present

## 2016-12-23 DIAGNOSIS — K589 Irritable bowel syndrome without diarrhea: Secondary | ICD-10-CM | POA: Diagnosis not present

## 2016-12-23 DIAGNOSIS — E041 Nontoxic single thyroid nodule: Secondary | ICD-10-CM | POA: Diagnosis not present

## 2016-12-24 DIAGNOSIS — R1011 Right upper quadrant pain: Secondary | ICD-10-CM | POA: Diagnosis not present

## 2017-01-01 ENCOUNTER — Ambulatory Visit (HOSPITAL_COMMUNITY): Admission: RE | Admit: 2017-01-01 | Payer: Medicare Other | Source: Ambulatory Visit

## 2017-01-06 ENCOUNTER — Ambulatory Visit (HOSPITAL_COMMUNITY)
Admission: RE | Admit: 2017-01-06 | Discharge: 2017-01-06 | Disposition: A | Discharge status: Home / Self Care | Payer: Medicare Other | Source: Ambulatory Visit | Attending: Internal Medicine | Admitting: Internal Medicine

## 2017-01-06 ENCOUNTER — Telehealth: Payer: Self-pay | Admitting: Physician Assistant

## 2017-01-06 ENCOUNTER — Other Ambulatory Visit (HOSPITAL_COMMUNITY): Payer: Self-pay | Admitting: Internal Medicine

## 2017-01-06 DIAGNOSIS — R109 Unspecified abdominal pain: Secondary | ICD-10-CM | POA: Diagnosis not present

## 2017-01-06 DIAGNOSIS — R1011 Right upper quadrant pain: Secondary | ICD-10-CM | POA: Diagnosis not present

## 2017-01-06 DIAGNOSIS — Z9049 Acquired absence of other specified parts of digestive tract: Secondary | ICD-10-CM | POA: Diagnosis not present

## 2017-01-06 DIAGNOSIS — M6289 Other specified disorders of muscle: Secondary | ICD-10-CM | POA: Diagnosis not present

## 2017-01-06 DIAGNOSIS — K5730 Diverticulosis of large intestine without perforation or abscess without bleeding: Secondary | ICD-10-CM | POA: Diagnosis not present

## 2017-01-06 DIAGNOSIS — Z6839 Body mass index (BMI) 39.0-39.9, adult: Secondary | ICD-10-CM | POA: Diagnosis not present

## 2017-01-06 DIAGNOSIS — I700 Atherosclerosis of aorta: Secondary | ICD-10-CM | POA: Insufficient documentation

## 2017-01-06 DIAGNOSIS — Z981 Arthrodesis status: Secondary | ICD-10-CM | POA: Diagnosis not present

## 2017-01-06 DIAGNOSIS — I77811 Abdominal aortic ectasia: Secondary | ICD-10-CM | POA: Diagnosis not present

## 2017-01-07 ENCOUNTER — Encounter: Payer: Self-pay | Admitting: Podiatry

## 2017-01-07 ENCOUNTER — Ambulatory Visit (INDEPENDENT_AMBULATORY_CARE_PROVIDER_SITE_OTHER): Payer: Medicare Other | Admitting: Podiatry

## 2017-01-07 DIAGNOSIS — M25571 Pain in right ankle and joints of right foot: Secondary | ICD-10-CM | POA: Diagnosis not present

## 2017-01-07 DIAGNOSIS — M722 Plantar fascial fibromatosis: Secondary | ICD-10-CM

## 2017-01-07 NOTE — Telephone Encounter (Signed)
The pt has been advised to keep his appt with Alexander Nunez on 12/13.

## 2017-01-07 NOTE — Progress Notes (Signed)
Subjective:   Patient ID: Alexander Nunez, male   DOB: 71 y.o.   MRN: [id number]   HPI Patient presents stating my foot is doing fine with minimal discomfort and it seems like the boot has really helped me   ROS      Objective:  Physical Exam  Neurovascular status intact with patient's right foot doing well with good alignment noted and minimal discomfort with palpation     Assessment:  Doing well with acute plantar fasciitis right with immobilization medication     Plan:  H&P condition reviewed and today I went ahead and I advised on utilization of immobilization as needed anti-inflammatories physical therapy and supportive shoe.  Reappoint for [place] to recheck on an as-needed basis

## 2017-01-07 NOTE — Telephone Encounter (Signed)
Left message on machine to call back  

## 2017-01-08 ENCOUNTER — Ambulatory Visit
Admission: RE | Admit: 2017-01-08 | Discharge: 2017-01-08 | Disposition: A | Discharge status: Home / Self Care | Payer: Medicare Other | Source: Ambulatory Visit | Attending: Nurse Practitioner | Admitting: Nurse Practitioner

## 2017-01-08 DIAGNOSIS — M545 Low back pain: Secondary | ICD-10-CM | POA: Diagnosis not present

## 2017-01-08 DIAGNOSIS — M47816 Spondylosis without myelopathy or radiculopathy, lumbar region: Secondary | ICD-10-CM

## 2017-01-08 MED ORDER — METHYLPREDNISOLONE ACETATE 40 MG/ML INJ SUSP (RADIOLOG
120.0000 mg | Freq: Once | INTRAMUSCULAR | Status: AC
  Administered 2017-01-08: 120 mg via EPIDURAL

## 2017-01-08 MED ORDER — IOPAMIDOL (ISOVUE-M 200) INJECTION 41%
1.0000 mL | Freq: Once | INTRAMUSCULAR | Status: AC
  Administered 2017-01-08: 1 mL via EPIDURAL

## 2017-01-15 ENCOUNTER — Ambulatory Visit (INDEPENDENT_AMBULATORY_CARE_PROVIDER_SITE_OTHER): Payer: Medicare Other | Admitting: Physician Assistant

## 2017-01-15 ENCOUNTER — Encounter: Payer: Self-pay | Admitting: Physician Assistant

## 2017-01-15 VITALS — BP 110/70 | HR 60 | Ht 71.0 in | Wt 275.0 lb

## 2017-01-15 DIAGNOSIS — K5900 Constipation, unspecified: Secondary | ICD-10-CM | POA: Diagnosis not present

## 2017-01-15 DIAGNOSIS — R1011 Right upper quadrant pain: Secondary | ICD-10-CM | POA: Diagnosis not present

## 2017-01-15 DIAGNOSIS — R140 Abdominal distension (gaseous): Secondary | ICD-10-CM

## 2017-01-15 NOTE — Progress Notes (Signed)
Chief Complaint: Follow-up right upper quadrant abdominal pain and bloating  HPI:    Alexander Nunez is a 71 year old Caucasian male with a past medical history as listed below, who returns to clinic today for follow-up of his right upper quadrant pain.      Please recall patient follows with Dr. Fuller Plan.  His last colonoscopy was in 2013 with recommendations for repeat in 5 years due to history of an polyps.  The patient was last seen in clinic 12/17/16 by me and explained that he was recently started on Plavix a month and a half ago.  Patient also described that he had right upper quadrant pain which was intermittent and "had waves of intensity" over the previous 2 weeks.  This was somewhat worse when he was moving around.  He also noticed a lot of excess gas in his system and had been chewing " Gas-X like candy".  At that time we discussed right upper quadrant pain which could be related to gas buildup or adhesions in this area after cholecystectomy at the beginning of the year versus possible costochondritis.  He was told to apply warm compresses for 10-15 minutes at a time.  It was also recommended he use Gas-X before meals and patient was scheduled for a screening colonoscopy with Dr. Fuller Plan in January pending Dr. Rolly Salter recommendations re Plavix. (this was approved to be held per review of notes) Labs were ordered to include a CBC and CMP.  We discussed that if the patient's right upper quadrant pain got worse we could do right upper quadrant ultrasound for further assessment.    CBC and CMP 12/17/16 return normal.  It appears patient then went to his primary care provider and continued to complain of right-sided pain.  A CT renal stone study was done 01/06/17 without contrast.  This showed no renal or ureteral obstructing stone or hydronephrosis.  Patient is post cholecystectomy.  Sigmoid colon and descending colon diverticulosis with prominent muscular hypertrophy in the sigmoid colon.  No extraluminal  bowel inflammatory process.  Specifically no inflammation surrounding the appendix or terminal ileum.  Aortic atherosclerosis.  Prior fusion L4-5.    Today, the patient expresses that he went to see his physician regarding his back pain and had an epidural injection and 2 days after this injection, his right upper quadrant pain eased and went away.  He was told at that visit that this could be related to a neuropathic pain and that the shot may help.  The patient is encouraged that it is "just my back and nothing else".     Patient's continued complaint today is of bloating.  He does express that he has had a change towards constipation over the past couple of weeks and has been trying an "over-the-counter laxative" which has not really been helping him to feel empty after bowel movements.  He has continued to take Gas-X which does help to relieve some of the buildup.    Patient denies fever, chills, blood in the stool, melena, weight loss, anorexia, nausea, vomiting, heartburn or reflux.    Past Medical History:  Diagnosis Date  . Anxiety   . Back pain, chronic   . Colon polyp   . GERD (gastroesophageal reflux disease)   . Heart palpitations   . High cholesterol   . Hypertension   . Irritable bowel syndrome   . Osteoporosis   . Psoriasis     Past Surgical History:  Procedure Laterality Date  . BACK SURGERY  2015  . CHOLECYSTECTOMY N/A 10/12/2015   Procedure: LAPAROSCOPIC CHOLECYSTECTOMY;  Surgeon: Aviva Signs, MD;  Location: [facility];  Service: General;  Laterality: N/A;  . FOOT FRACTURE SURGERY     left foot  . KNEE ARTHROSCOPY     left    Current Outpatient Medications  Medication Sig Dispense Refill  . ALPRAZolam (XANAX) 0.5 MG tablet Take 0.25-0.5 mg by mouth at bedtime as needed for sleep.     Marland Kitchen aspirin EC 81 MG tablet Take 81 mg by mouth daily.    . clopidogrel (PLAVIX) 75 MG tablet Take 75 mg daily by mouth.    . diclofenac (VOLTAREN) 75 MG EC tablet TAKE 1 TABLET BY MOUTH  TWICE DAILY 90 tablet 0  . doxazosin (CARDURA) 4 MG tablet Take 4 mg by mouth at bedtime.      . folic acid (FOLVITE) 1 MG tablet Take 1 mg by mouth every evening.     . hyoscyamine (LEVSIN SL) 0.125 MG SL tablet Place 1 tablet (0.125 mg total) under the tongue every 4 (four) hours as needed. 30 tablet 2  . lisinopril (PRINIVIL,ZESTRIL) 10 MG tablet Take by mouth every evening. Takes 20 mg daily    . methotrexate (RHEUMATREX) 2.5 MG tablet Take 5 mg by mouth once a week. Take 2 tab weekly on Wednesday Caution:Chemotherapy. Protect from light.    . metoprolol tartrate (LOPRESSOR) 25 MG tablet Take 1 tablet (25 mg total) by mouth 2 (two) times daily. 60 tablet 0  . Na Sulfate-K Sulfate-Mg Sulf (SUPREP BOWEL PREP KIT) 17.5-3.13-1.6 GM/177ML SOLN Take 1 kit as directed by mouth. 324 mL 0  . oxyCODONE-acetaminophen (PERCOCET/ROXICET) 5-325 MG tablet Take 1-2 tablets by mouth every 4 (four) hours as needed for moderate pain or severe pain. 50 tablet 0  . pantoprazole (PROTONIX) 40 MG tablet Take 1 tablet (40 mg total) by mouth 2 (two) times daily. (Patient taking differently: Take 40 mg by mouth every morning. ) 60 tablet 11  . pravastatin (PRAVACHOL) 20 MG tablet Take 20 mg by mouth every evening.     . Probiotic Product (PROBIOTIC PO) Take 1 capsule by mouth daily.     No current facility-administered medications for this visit.     Allergies as of 01/15/2017 - Review Complete 01/07/2017  Allergen Reaction Noted  . Cephalexin Other (See Comments) 03/07/2008  . Contrast media [iodinated diagnostic agents] Hives and Rash 09/17/2011    Family History  Problem Relation Age of Onset  . Arrhythmia Mother        Atrial fib  . Arrhythmia Sister        Atrial fib  . Stroke Sister   . Breast cancer Sister   . Bladder Cancer Father           . Colon cancer Neg Hx   . Stomach cancer Neg Hx     Social History   Socioeconomic History  . Marital status: Married    Spouse name: Not on file  .  Number of children: 2  . Years of education: Not on file  . Highest education level: Not on file  Social Needs  . Financial resource strain: Not on file  . Food insecurity - worry: Not on file  . Food insecurity - inability: Not on file  . Transportation needs - medical: Not on file  . Transportation needs - non-medical: Not on file  Occupational History    Comment: Retired  Tobacco Use  . Smoking status: Former Smoker  Packs/day: 2.00    Types: Cigarettes    Start date: 05/22/1962    Last attempt to quit: 05/21/1973    Years since quitting: 43.6  . Smokeless tobacco: Never Used  . Tobacco comment: Quit 25 yrs now   Substance and Sexual Activity  . Alcohol use: Yes    Alcohol/week: 8.4 oz    Types: 14 Shots of liquor per week    Comment: Rare   . Drug use: No  . Sexual activity: Yes    Partners: Female  Other Topics Concern  . Not on file  Social History Narrative   Has dog named [place]    Review of Systems:    Constitutional: No weight loss, fever or chills Cardiovascular: No chest pain   Respiratory: No SOB  Gastrointestinal: See HPI and otherwise negative   Physical Exam:  Vital signs: BP 110/70   Pulse 60   Ht '5\' 11"'$  (1.803 m)   Wt 275 lb (124.7 kg)   BMI 38.35 kg/m    Constitutional:   Pleasant overweight Caucasian male appears to be in NAD, Well developed, Well nourished, alert and cooperative Respiratory: Respirations even and unlabored. Lungs clear to auscultation bilaterally.   No wheezes, crackles, or rhonchi.  Cardiovascular: Normal S1, S2. No MRG. Regular rate and rhythm. No peripheral edema, cyanosis or pallor.  Gastrointestinal:  Soft, mild distension, nontender. No rebound or guarding. Normal bowel sounds. No appreciable masses or hepatomegaly. Psychiatric: Demonstrates good judgement and reason without abnormal affect or behaviors.  RELEVANT LABS AND IMAGING: CBC    Component Value Date/Time   WBC 8.9 12/17/2016 1014   RBC 4.36  12/17/2016 1014   HGB 14.3 12/17/2016 1014   HCT 42.3 12/17/2016 1014   PLT 263.0 12/17/2016 1014   MCV 97.0 12/17/2016 1014   MCH 32.4 04/03/2016 0831   MCHC 33.8 12/17/2016 1014   RDW 13.1 12/17/2016 1014   LYMPHSABS 2.0 12/17/2016 1014   MONOABS 0.8 12/17/2016 1014   EOSABS 0.2 12/17/2016 1014   BASOSABS 0.1 12/17/2016 1014    CMP     Component Value Date/Time   NA 137 12/17/2016 1014   K 4.4 12/17/2016 1014   CL 102 12/17/2016 1014   CO2 29 12/17/2016 1014   GLUCOSE 102 (H) 12/17/2016 1014   BUN 20 12/17/2016 1014   CREATININE 1.22 12/17/2016 1014   CALCIUM 9.4 12/17/2016 1014   PROT 7.4 12/17/2016 1014   ALBUMIN 4.2 12/17/2016 1014   AST 15 12/17/2016 1014   ALT 17 12/17/2016 1014   ALKPHOS 58 12/17/2016 1014   BILITOT 0.8 12/17/2016 1014   GFRNONAA >60 04/03/2016 0831   GFRAA >60 04/03/2016 0831   EXAM: CT ABDOMEN AND PELVIS WITHOUT CONTRAST 01/06/17  TECHNIQUE: Multidetector CT imaging of the abdomen and pelvis was performed following the standard protocol without IV contrast.  COMPARISON:  04/28/2016 CT.  FINDINGS: Lower chest: Chronic lung base changes without acute abnormality. Heart size top-normal. Prominent epicardial fat pad.  Hepatobiliary: Taking into account limitation by non contrast imaging, no worrisome hepatic lesion. Tiny calcification noted. Post cholecystectomy.  Pancreas: Taking into account limitation by non contrast imaging, no pancreatic mass or inflammation.  Spleen: Taking into account limitation by non contrast imaging, no splenic mass or enlargement.  Adrenals/Urinary Tract: No renal or ureteral obstructing stone or hydronephrosis. Taking into account limitation by non contrast imaging, no worrisome renal or adrenal mass.  Partially distended noncontrast filled urinary bladder without gross abnormality.  Stomach/Bowel: Prominent diverticula and muscular  hypertrophy sigmoid colon and descending colon. No  extraluminal bowel inflammatory process. Specifically, no inflammation surrounds the appendix or terminal ileum.  No gastric or duodenal abnormality noted.  Vascular/Lymphatic: Atherosclerotic changes aorta. Focal bulge infrarenal region with maximal transverse dimension of 2.9 cm without change. Mild ectasia below this region.  Atherosclerotic changes iliac arteries, origin superior mesenteric artery and renal arteries.  No adenopathy.  Reproductive: Partial calcification prostate gland.  Other: No free intraperitoneal air or bowel containing hernia.  Musculoskeletal: Prior fusion L4-5.  IMPRESSION: No renal or ureteral obstructing stone or hydronephrosis.  Post cholecystectomy.  Sigmoid colon and descending colon diverticulosis with prominent muscular hypertrophy sigmoid colon. No extraluminal bowel inflammatory process. Specifically no inflammation surrounds the appendix or terminal ileum.  Aortic atherosclerosis. Ectatic aorta with maximal transverse dimension 2.9 cm without significant change from the prior exam. Ectatic abdominal aorta at risk for aneurysm development. Recommend followup by ultrasound in 5 years. This recommendation follows ACR consensus guidelines: White Paper of the ACR Incidental Findings Committee II on Vascular Findings. J Am Coll Radiol 2013; 10:789-794.  Prior fusion L4-5.   Electronically Signed   By: Genia Del M.D.   On: 01/06/2017 12:50  Assessment: 1. RUQ abdominal pain: Relieved after recent epidural injection 2.  Bloating: Patient now with a change in bowel habits towards constipation, likely symptoms are related to IBS, he is taking a daily fiber supplement, but asked today about probiotics. 3. Constipation: likely related to recent pain reliever use  Plan: 1.  Patient should continue his fiber supplementation as well as increased water intake of at least 6-8 8 ounce glasses of water per day. 2.  Recommend the  patient start daily Align after his upcoming colonoscopy.  He was provided a coupon today.  He should continue this for at least 2-3 months after that procedure. 3.  Expressed my joy that the patient found the reason behind his right upper quadrant abdominal pain which was actually neuropathic. 4.  Patient to continue Pantoprazole 40 mg twice daily. 5.  Would recommend the patient start a daily MiraLAX dose.  Discussed titration of this, up to 4 times a day if necessary.  Patient verbalized understanding. 5.  Patient to follow in clinic per recommendations from Dr. Fuller Plan after his upcoming colonoscopy scheduled in January.  Ellouise Newer, PA-C [facility] 01/15/2017, 11:09 AM  Cc: Sharilyn Sites, MD

## 2017-01-15 NOTE — Progress Notes (Signed)
Reviewed and agree with management plan.  Maddix Heinz T. Trany Chernick, MD FACG 

## 2017-01-15 NOTE — Patient Instructions (Addendum)
miralax daily can titrate up to four times a day if needed.   Can start align once daily after colonoscopy if needed. For 2-3 months.   Continue fiber supplement.

## 2017-01-17 ENCOUNTER — Other Ambulatory Visit: Payer: Self-pay | Admitting: Podiatry

## 2017-01-19 ENCOUNTER — Other Ambulatory Visit: Payer: Self-pay | Admitting: Podiatry

## 2017-02-02 ENCOUNTER — Other Ambulatory Visit: Payer: Self-pay | Admitting: Endocrinology

## 2017-02-02 DIAGNOSIS — E041 Nontoxic single thyroid nodule: Secondary | ICD-10-CM

## 2017-02-06 ENCOUNTER — Other Ambulatory Visit: Payer: Self-pay | Admitting: Podiatry

## 2017-02-20 ENCOUNTER — Other Ambulatory Visit: Payer: Self-pay

## 2017-02-20 ENCOUNTER — Encounter: Payer: Self-pay | Admitting: Gastroenterology

## 2017-02-20 ENCOUNTER — Ambulatory Visit (AMBULATORY_SURGERY_CENTER): Payer: Medicare Other | Admitting: Gastroenterology

## 2017-02-20 VITALS — BP 109/59 | HR 67 | Temp 98.6°F | Resp 12 | Ht 71.0 in | Wt 275.0 lb

## 2017-02-20 DIAGNOSIS — K5900 Constipation, unspecified: Secondary | ICD-10-CM

## 2017-02-20 DIAGNOSIS — Z86010 Personal history of colonic polyps: Secondary | ICD-10-CM

## 2017-02-20 DIAGNOSIS — R011 Cardiac murmur, unspecified: Secondary | ICD-10-CM | POA: Diagnosis not present

## 2017-02-20 DIAGNOSIS — I10 Essential (primary) hypertension: Secondary | ICD-10-CM | POA: Diagnosis not present

## 2017-02-20 DIAGNOSIS — K219 Gastro-esophageal reflux disease without esophagitis: Secondary | ICD-10-CM | POA: Diagnosis not present

## 2017-02-20 MED ORDER — SODIUM CHLORIDE 0.9 % IV SOLN: 500.0000 mL | INTRAVENOUS | Status: DC

## 2017-02-20 NOTE — Progress Notes (Signed)
Report to PACU, RN, vss, BBS= Clear.  

## 2017-02-20 NOTE — Op Note (Signed)
[facility] Patient Name: Alexander Nunez Procedure Date: 02/20/2017 8:22 AM MRN: [id number] Endoscopist: Ladene Artist , MD Age: 72 Referring MD:  Date of Birth: 08/29/1945 Gender: Male Account #: [id number] Procedure:                Colonoscopy Indications:              Surveillance: Personal history of adenomatous                            polyps on last colonoscopy 5 years ago Medicines:                Monitored Anesthesia Care Procedure:                Pre-Anesthesia Assessment:                           - Prior to the procedure, a History and Physical                            was performed, and patient medications and                            allergies were reviewed. The patient's tolerance of                            previous anesthesia was also reviewed. The risks                            and benefits of the procedure and the sedation                            options and risks were discussed with the patient.                            All questions were answered, and informed consent                            was obtained. Prior Anticoagulants: The patient has                            taken no previous anticoagulant or antiplatelet                            agents. ASA Grade Assessment: II - A patient with                            mild systemic disease. After reviewing the risks                            and benefits, the patient was deemed in                            satisfactory condition to undergo the procedure.  After obtaining informed consent, the colonoscope                            was passed under direct vision. Throughout the                            procedure, the patient's blood pressure, pulse, and                            oxygen saturations were monitored continuously. The                            Colonoscope was introduced through the anus and                            advanced to the the cecum,  identified by                            appendiceal orifice and ileocecal valve. The                            ileocecal valve, appendiceal orifice, and rectum                            were photographed. The quality of the bowel                            preparation was good. The colonoscopy was performed                            without difficulty. The patient tolerated the                            procedure well. Scope In: 8:34:09 AM Scope Out: 8:43:38 AM Scope Withdrawal Time: 0 hours 8 minutes 30 seconds  Total Procedure Duration: 0 hours 9 minutes 29 seconds  Findings:                 The perianal and digital rectal examinations were                            normal.                           Internal hemorrhoids were found during                            retroflexion. The hemorrhoids were small and Grade                            I (internal hemorrhoids that do not prolapse).                           Multiple medium-mouthed diverticula were found in  the left colon. There was no evidence of                            diverticular bleeding.                           The exam was otherwise without abnormality on                            direct and retroflexion views. Complications:            No immediate complications. Estimated blood loss:                            None. Estimated Blood Loss:     Estimated blood loss: none. Impression:               - Internal hemorrhoids.                           - Moderate diverticulosis in the left colon. There                            was no evidence of diverticular bleeding.                           - The examination was otherwise normal on direct                            and retroflexion views.                           - No specimens collected. Recommendation:           - Repeat colonoscopy in 5 years for surveillance.                           - Patient has a contact number available for                             emergencies. The signs and symptoms of potential                            delayed complications were discussed with the                            patient. Return to normal activities tomorrow.                            Written discharge instructions were provided to the                            patient.                           - High fiber diet.                           -  Continue present medications. Ladene Artist, MD 02/20/2017 8:46:06 AM This report has been signed electronically.

## 2017-02-20 NOTE — Patient Instructions (Signed)
YOU HAD AN ENDOSCOPIC PROCEDURE TODAY AT [facility]:   Refer to the procedure report that was given to you for any specific questions about what was found during the examination.  If the procedure report does not answer your questions, please call your gastroenterologist to clarify.  If you requested that your care partner not be given the details of your procedure findings, then the procedure report has been included in a sealed envelope for you to review at your convenience later.  YOU SHOULD EXPECT: Some feelings of bloating in the abdomen. Passage of more gas than usual.  Walking can help get rid of the air that was put into your GI tract during the procedure and reduce the bloating. If you had a lower endoscopy (such as a colonoscopy or flexible sigmoidoscopy) you may notice spotting of blood in your stool or on the toilet paper. If you underwent a bowel prep for your procedure, you may not have a normal bowel movement for a few days.  Please Note:  You might notice some irritation and congestion in your nose or some drainage.  This is from the oxygen used during your procedure.  There is no need for concern and it should clear up in a day or so.  SYMPTOMS TO REPORT IMMEDIATELY:   Following lower endoscopy (colonoscopy or flexible sigmoidoscopy):  Excessive amounts of blood in the stool  Significant tenderness or worsening of abdominal pains  Swelling of the abdomen that is new, acute  Fever of 100F or higher  For urgent or emergent issues, a gastroenterologist can be reached at any hour by calling [phone].   DIET:  We do recommend a small meal at first, but then you may proceed to your regular diet.  Drink plenty of fluids but you should avoid alcoholic beverages for 24 hours.  ACTIVITY:  You should plan to take it easy for the rest of today and you should NOT DRIVE or use heavy machinery until tomorrow (because of the sedation medicines used during the test).     FOLLOW UP: Our staff will call the number listed on your records the next business day following your procedure to check on you and address any questions or concerns that you may have regarding the information given to you following your procedure. If we do not reach you, we will leave a message.  However, if you are feeling well and you are not experiencing any problems, there is no need to return our call.  We will assume that you have returned to your regular daily activities without incident.  If any biopsies were taken you will be contacted by phone or by letter within the next 1-3 weeks.  Please call us at [phone] if you have not heard about the biopsies in 3 weeks.   Repeat next Colonoscopy screening in 5 years Hemorrhoids (handout given) Diverticulosis (handout given)   SIGNATURES/CONFIDENTIALITY: You and/or your care partner have signed paperwork which will be entered into your electronic medical record.  These signatures attest to the fact that that the information above on your After Visit Summary has been reviewed and is understood.  Full responsibility of the confidentiality of this discharge information lies with you and/or your care-partner.

## 2017-02-23 ENCOUNTER — Telehealth: Payer: Self-pay

## 2017-02-23 NOTE — Telephone Encounter (Signed)
  Follow up Call-  Call back number 02/20/2017  Post procedure Call Back phone  # [phone] hm  Permission to leave phone message Yes  Some recent data might be hidden     Patient questions:  Do you have a fever, pain , or abdominal swelling? No. Pain Score  0 *  Have you tolerated food without any problems? Yes.    Have you been able to return to your normal activities? Yes.    Do you have any questions about your discharge instructions: Diet   No. Medications  No. Follow up visit  No.  Do you have questions or concerns about your Care? No.  Actions: * If pain score is 4 or above: No action needed, pain <4.

## 2017-02-27 ENCOUNTER — Ambulatory Visit
Admission: RE | Admit: 2017-02-27 | Discharge: 2017-02-27 | Disposition: A | Discharge status: Home / Self Care | Payer: Medicare Other | Source: Ambulatory Visit | Attending: Endocrinology | Admitting: Endocrinology

## 2017-02-27 DIAGNOSIS — E042 Nontoxic multinodular goiter: Secondary | ICD-10-CM | POA: Diagnosis not present

## 2017-02-27 DIAGNOSIS — E041 Nontoxic single thyroid nodule: Secondary | ICD-10-CM

## 2017-03-04 ENCOUNTER — Other Ambulatory Visit (HOSPITAL_COMMUNITY): Payer: Self-pay | Admitting: Endocrinology

## 2017-03-04 DIAGNOSIS — E041 Nontoxic single thyroid nodule: Secondary | ICD-10-CM

## 2017-03-11 ENCOUNTER — Ambulatory Visit (HOSPITAL_COMMUNITY)
Admission: RE | Admit: 2017-03-11 | Discharge: 2017-03-11 | Disposition: A | Discharge status: Home / Self Care | Payer: Medicare Other | Source: Ambulatory Visit | Attending: Endocrinology | Admitting: Endocrinology

## 2017-03-11 ENCOUNTER — Encounter (HOSPITAL_COMMUNITY): Payer: Self-pay

## 2017-03-19 NOTE — Telephone Encounter (Signed)
Erroneous encounter

## 2017-03-24 ENCOUNTER — Other Ambulatory Visit: Payer: Self-pay | Admitting: Nurse Practitioner

## 2017-03-24 DIAGNOSIS — M47816 Spondylosis without myelopathy or radiculopathy, lumbar region: Secondary | ICD-10-CM

## 2017-04-03 ENCOUNTER — Ambulatory Visit
Admission: RE | Admit: 2017-04-03 | Discharge: 2017-04-03 | Disposition: A | Discharge status: Home / Self Care | Payer: Medicare Other | Source: Ambulatory Visit | Attending: Nurse Practitioner | Admitting: Nurse Practitioner

## 2017-04-03 DIAGNOSIS — M545 Low back pain: Secondary | ICD-10-CM | POA: Diagnosis not present

## 2017-04-03 DIAGNOSIS — M47816 Spondylosis without myelopathy or radiculopathy, lumbar region: Secondary | ICD-10-CM

## 2017-04-03 MED ORDER — METHYLPREDNISOLONE ACETATE 40 MG/ML INJ SUSP (RADIOLOG
120.0000 mg | Freq: Once | INTRAMUSCULAR | Status: AC
  Administered 2017-04-03: 120 mg via EPIDURAL

## 2017-04-03 MED ORDER — IOPAMIDOL (ISOVUE-M 200) INJECTION 41%
1.0000 mL | Freq: Once | INTRAMUSCULAR | Status: AC
  Administered 2017-04-03: 1 mL via EPIDURAL

## 2017-04-08 ENCOUNTER — Ambulatory Visit (HOSPITAL_COMMUNITY): Payer: Medicare Other

## 2017-04-15 ENCOUNTER — Ambulatory Visit (HOSPITAL_COMMUNITY)
Admission: RE | Admit: 2017-04-15 | Discharge: 2017-04-15 | Disposition: A | Discharge status: Home / Self Care | Payer: Medicare Other | Source: Ambulatory Visit | Attending: Endocrinology | Admitting: Endocrinology

## 2017-04-15 DIAGNOSIS — E041 Nontoxic single thyroid nodule: Secondary | ICD-10-CM | POA: Diagnosis not present

## 2017-04-15 MED ORDER — LIDOCAINE HCL (PF) 2 % IJ SOLN
INTRAMUSCULAR | Status: AC
  Filled 2017-04-15: qty 10

## 2017-04-15 NOTE — Discharge Instructions (Signed)
Thyroid Biopsy °The thyroid gland is a butterfly-shaped gland located in the front of the neck. It produces hormones that affect metabolism, growth and development, and body temperature. Thyroid biopsy is a procedure in which small samples of tissue or fluid are removed from the thyroid gland. The samples are then looked at under a microscope to check for abnormalities. This procedure is done to determine the cause of thyroid problems. It may be done to check for infection, cancer, or other thyroid problems. °Two methods may be used for a thyroid biopsy. In one method, a thin needle is inserted through the skin and into the thyroid gland. In the other method, an open incision is made through the skin. °Tell a health care provider about: °· Any allergies you have. °· All medicines you are taking, including vitamins, herbs, eye drops, creams, and over-the-counter medicines. °· Any problems you or family members have had with anesthetic medicines. °· Any blood disorders you have. °· Any surgeries you have had. °· Any medical conditions you have. °What are the risks? °Generally, this is a safe procedure. However, problems can occur and include: °· Bleeding from the procedure site. °· Infection. °· Injury to structures near the thyroid gland. ° °What happens before the procedure? °· Ask your health care provider about: °? Changing or stopping your regular medicines. This is especially important if you are taking diabetes medicines or blood thinners. °? Taking medicines such as aspirin and ibuprofen. These medicines can thin your blood. Do not take these medicines before your procedure if your health care provider asks you not to. °· Do not eat or drink anything after midnight on the night before the procedure or as directed by your health care provider. °· You may have a blood sample taken. °What happens during the procedure? °Either of these methods may be used to perform a thyroid biopsy: °· Fine needle biopsy. You may  be given medicine to help you relax (sedative). You will be asked to lie on your back with your head tipped backward to extend your neck. An area on your neck will be cleaned. A needle will then be inserted through the skin of your neck. You may be asked to avoid coughing, talking, swallowing, or making sounds during some portions of the procedure. The needle will be withdrawn once the tissue or fluid samples have been removed. Pressure may be applied to your neck to reduce swelling and ensure that bleeding has stopped. The samples will be sent to a lab for examination. °· Open biopsy. You will be given medicine to make you sleep (general anesthetic). An incision will be made in your neck. A sample of thyroid tissue will be removed using surgical tools. The tissue sample will be sent for examination. In some cases, the sample may be examined during the biopsy. If that is done and cancer cells are found, some or all of the thyroid gland may be removed. The incision will be closed with stitches. ° °What happens after the procedure? °· Your recovery will be assessed and monitored. °· You may have soreness and tenderness at the site of the biopsy. This should go away after a few days. °· If you had an open biopsy, you may have a hoarse voice or sore throat for a couple days. °· It is your responsibility to get your test results. °This information is not intended to replace advice given to you by your health care provider. Make sure you discuss any questions you have with   your health care provider. °Document Released: 11/17/2006 Document Revised: 09/23/2015 Document Reviewed: 04/14/2013 °Elsevier Interactive Patient Education © 2018 [employer] ° °

## 2017-04-30 DIAGNOSIS — M5416 Radiculopathy, lumbar region: Secondary | ICD-10-CM | POA: Diagnosis not present

## 2017-04-30 DIAGNOSIS — M47816 Spondylosis without myelopathy or radiculopathy, lumbar region: Secondary | ICD-10-CM | POA: Diagnosis not present

## 2017-05-01 ENCOUNTER — Other Ambulatory Visit: Payer: Self-pay | Admitting: Neurosurgery

## 2017-05-01 DIAGNOSIS — M47816 Spondylosis without myelopathy or radiculopathy, lumbar region: Secondary | ICD-10-CM

## 2017-05-14 ENCOUNTER — Ambulatory Visit
Admission: RE | Admit: 2017-05-14 | Discharge: 2017-05-14 | Disposition: A | Discharge status: Home / Self Care | Payer: Medicare Other | Source: Ambulatory Visit | Attending: Neurosurgery | Admitting: Neurosurgery

## 2017-05-14 DIAGNOSIS — J329 Chronic sinusitis, unspecified: Secondary | ICD-10-CM | POA: Diagnosis not present

## 2017-05-14 DIAGNOSIS — M47816 Spondylosis without myelopathy or radiculopathy, lumbar region: Secondary | ICD-10-CM

## 2017-05-14 DIAGNOSIS — I77819 Aortic ectasia, unspecified site: Secondary | ICD-10-CM | POA: Diagnosis not present

## 2017-05-14 DIAGNOSIS — M545 Low back pain: Secondary | ICD-10-CM | POA: Diagnosis not present

## 2017-05-14 DIAGNOSIS — K219 Gastro-esophageal reflux disease without esophagitis: Secondary | ICD-10-CM | POA: Diagnosis not present

## 2017-05-14 DIAGNOSIS — E669 Obesity, unspecified: Secondary | ICD-10-CM | POA: Diagnosis not present

## 2017-05-14 DIAGNOSIS — Z6841 Body Mass Index (BMI) 40.0 and over, adult: Secondary | ICD-10-CM | POA: Diagnosis not present

## 2017-05-14 DIAGNOSIS — I10 Essential (primary) hypertension: Secondary | ICD-10-CM | POA: Diagnosis not present

## 2017-05-14 DIAGNOSIS — J042 Acute laryngotracheitis: Secondary | ICD-10-CM | POA: Diagnosis not present

## 2017-05-27 DIAGNOSIS — M47816 Spondylosis without myelopathy or radiculopathy, lumbar region: Secondary | ICD-10-CM | POA: Diagnosis not present

## 2017-05-27 DIAGNOSIS — M5416 Radiculopathy, lumbar region: Secondary | ICD-10-CM | POA: Diagnosis not present

## 2017-06-17 DIAGNOSIS — Z6841 Body Mass Index (BMI) 40.0 and over, adult: Secondary | ICD-10-CM | POA: Diagnosis not present

## 2017-06-17 DIAGNOSIS — S61249A Puncture wound with foreign body of unspecified finger without damage to nail, initial encounter: Secondary | ICD-10-CM | POA: Diagnosis not present

## 2017-06-22 DIAGNOSIS — Z1389 Encounter for screening for other disorder: Secondary | ICD-10-CM | POA: Diagnosis not present

## 2017-06-22 DIAGNOSIS — Z6838 Body mass index (BMI) 38.0-38.9, adult: Secondary | ICD-10-CM | POA: Diagnosis not present

## 2017-06-22 DIAGNOSIS — E7849 Other hyperlipidemia: Secondary | ICD-10-CM | POA: Diagnosis not present

## 2017-06-22 DIAGNOSIS — D126 Benign neoplasm of colon, unspecified: Secondary | ICD-10-CM | POA: Diagnosis not present

## 2017-06-22 DIAGNOSIS — I10 Essential (primary) hypertension: Secondary | ICD-10-CM | POA: Diagnosis not present

## 2017-06-22 DIAGNOSIS — E042 Nontoxic multinodular goiter: Secondary | ICD-10-CM | POA: Diagnosis not present

## 2017-06-22 DIAGNOSIS — K589 Irritable bowel syndrome without diarrhea: Secondary | ICD-10-CM | POA: Diagnosis not present

## 2017-06-22 DIAGNOSIS — M545 Low back pain: Secondary | ICD-10-CM | POA: Diagnosis not present

## 2017-06-22 DIAGNOSIS — G4733 Obstructive sleep apnea (adult) (pediatric): Secondary | ICD-10-CM | POA: Diagnosis not present

## 2017-06-22 DIAGNOSIS — E668 Other obesity: Secondary | ICD-10-CM | POA: Diagnosis not present

## 2017-06-22 DIAGNOSIS — I77819 Aortic ectasia, unspecified site: Secondary | ICD-10-CM | POA: Diagnosis not present

## 2017-06-22 DIAGNOSIS — K219 Gastro-esophageal reflux disease without esophagitis: Secondary | ICD-10-CM | POA: Diagnosis not present

## 2017-07-09 DIAGNOSIS — K5732 Diverticulitis of large intestine without perforation or abscess without bleeding: Secondary | ICD-10-CM | POA: Diagnosis not present

## 2017-07-09 DIAGNOSIS — Z6839 Body mass index (BMI) 39.0-39.9, adult: Secondary | ICD-10-CM | POA: Diagnosis not present

## 2017-07-09 DIAGNOSIS — R1032 Left lower quadrant pain: Secondary | ICD-10-CM | POA: Diagnosis not present

## 2017-07-11 ENCOUNTER — Emergency Department (HOSPITAL_COMMUNITY)
Admission: EM | Admit: 2017-07-11 | Discharge: 2017-07-11 | Disposition: A | Discharge status: Home / Self Care | Payer: Medicare Other | Attending: Emergency Medicine | Admitting: Emergency Medicine

## 2017-07-11 ENCOUNTER — Emergency Department (HOSPITAL_COMMUNITY): Payer: Medicare Other

## 2017-07-11 ENCOUNTER — Encounter (HOSPITAL_COMMUNITY): Payer: Self-pay | Admitting: Emergency Medicine

## 2017-07-11 DIAGNOSIS — R079 Chest pain, unspecified: Secondary | ICD-10-CM | POA: Diagnosis not present

## 2017-07-11 DIAGNOSIS — R0789 Other chest pain: Secondary | ICD-10-CM | POA: Diagnosis not present

## 2017-07-11 DIAGNOSIS — Z7982 Long term (current) use of aspirin: Secondary | ICD-10-CM | POA: Insufficient documentation

## 2017-07-11 DIAGNOSIS — R7989 Other specified abnormal findings of blood chemistry: Secondary | ICD-10-CM | POA: Diagnosis not present

## 2017-07-11 DIAGNOSIS — I10 Essential (primary) hypertension: Secondary | ICD-10-CM | POA: Insufficient documentation

## 2017-07-11 DIAGNOSIS — Z79899 Other long term (current) drug therapy: Secondary | ICD-10-CM | POA: Diagnosis not present

## 2017-07-11 DIAGNOSIS — Z87891 Personal history of nicotine dependence: Secondary | ICD-10-CM | POA: Diagnosis not present

## 2017-07-11 HISTORY — DX: Diverticulitis of intestine, part unspecified, without perforation or abscess without bleeding: K57.92

## 2017-07-11 LAB — CBC
HEMATOCRIT: 41.5 % (ref 39.0–52.0)
HEMOGLOBIN: 13.9 g/dL (ref 13.0–17.0)
MCH: 32 pg (ref 26.0–34.0)
MCHC: 33.5 g/dL (ref 30.0–36.0)
MCV: 95.4 fL (ref 78.0–100.0)
Platelets: 277 10*3/uL (ref 150–400)
RBC: 4.35 MIL/uL (ref 4.22–5.81)
RDW: 13.1 % (ref 11.5–15.5)
WBC: 9 10*3/uL (ref 4.0–10.5)

## 2017-07-11 LAB — BASIC METABOLIC PANEL
ANION GAP: 9 (ref 5–15)
BUN: 12 mg/dL (ref 6–20)
CALCIUM: 9.4 mg/dL (ref 8.9–10.3)
CO2: 24 mmol/L (ref 22–32)
Chloride: 105 mmol/L (ref 101–111)
Creatinine, Ser: 1.29 mg/dL — ABNORMAL HIGH (ref 0.61–1.24)
GFR calc non Af Amer: 54 mL/min — ABNORMAL LOW (ref >60)
GLUCOSE: 111 mg/dL — AB (ref 65–99)
POTASSIUM: 4.1 mmol/L (ref 3.5–5.1)
Sodium: 138 mmol/L (ref 135–145)

## 2017-07-11 LAB — D-DIMER, QUANTITATIVE: D-Dimer, Quant: 0.75 ug/mL-FEU — ABNORMAL HIGH (ref 0.00–0.50)

## 2017-07-11 LAB — TROPONIN I

## 2017-07-11 MED ORDER — OXYCODONE-ACETAMINOPHEN 5-325 MG PO TABS
1.0000 | ORAL_TABLET | Freq: Once | ORAL | Status: AC
  Administered 2017-07-11: 1 via ORAL
  Filled 2017-07-11: qty 1

## 2017-07-11 MED ORDER — HYDROCORTISONE NA SUCCINATE PF 100 MG IJ SOLR
200.0000 mg | Freq: Once | INTRAMUSCULAR | Status: AC
  Administered 2017-07-11: 200 mg via INTRAVENOUS
  Filled 2017-07-11: qty 4

## 2017-07-11 MED ORDER — HYDROCORTISONE NA SUCCINATE PF 250 MG IJ SOLR
200.0000 mg | Freq: Once | INTRAMUSCULAR | Status: DC
  Filled 2017-07-11: qty 200

## 2017-07-11 MED ORDER — DIPHENHYDRAMINE HCL 25 MG PO CAPS
50.0000 mg | ORAL_CAPSULE | Freq: Once | ORAL | Status: AC
  Administered 2017-07-11: 50 mg via ORAL
  Filled 2017-07-11: qty 2

## 2017-07-11 MED ORDER — IOPAMIDOL (ISOVUE-370) INJECTION 76%
100.0000 mL | Freq: Once | INTRAVENOUS | Status: AC | PRN
  Administered 2017-07-11: 100 mL via INTRAVENOUS

## 2017-07-11 NOTE — ED Notes (Signed)
Patient transported to CT 

## 2017-07-11 NOTE — ED Provider Notes (Signed)
[facility] EMERGENCY DEPARTMENT Provider Note   CSN: [id number] Arrival date & time: 07/11/17  0350     History   Chief Complaint Chief Complaint  Patient presents with  . Chest Pain    HPI [name] is a 72 y.o. male.  The history is provided by the patient.  Chest Pain   This is a new problem. Episode onset: 5 days. The problem occurs constantly. The problem has been gradually worsening. The pain is associated with exertion. The pain is moderate. The pain does not radiate. Duration of episode(s) is 5 days. Pertinent negatives include no abdominal pain. He has tried nothing for the symptoms. The treatment provided no relief.  His family medical history is significant for hypertension.  Pt reports pain in the left side of his chest for 5 days.  Pt reports he played golf earlier this week but he did not injure himself.  Pt denies any trouble breathing.    Past Medical History:  Diagnosis Date  . Anxiety   . Arthritis    back  . Back pain, chronic   . Colon polyp   . Diverticulitis   . GERD (gastroesophageal reflux disease)   . Heart palpitations   . High cholesterol   . Hypertension   . Irritable bowel syndrome   . Osteoporosis   . Psoriasis   . Sleep apnea    c- pap    Patient Active Problem List   Diagnosis Date Noted  . Atypical chest pain 05/04/2014  . Sleep apnea 05/04/2014  . Abdominal bloating 07/22/2013  . Spermatocele 05/02/2013  . Chronic prostatitis 01/31/2013  . Pain in testicle 01/31/2013  . Weakness of left leg 08/12/2012  . Generalized abdominal pain 09/02/2011  . Lumbar canal stenosis 05/28/2011  . Degenerative arthritis of lumbar spine 05/28/2011  . SPL (spondylolisthesis) 05/28/2011  . PERSONAL HX COLONIC POLYPS 04/19/2009  . IRRITABLE BOWEL SYNDROME 04/06/2008  . ABDOMINAL BLOATING 03/07/2008  . CHANGE IN BOWELS 03/07/2008  . ABDOMINAL PAIN-MULTIPLE SITES 03/07/2008  . Essential hypertension 03/06/2008  . GERD 03/06/2008  .  GASTRITIS 03/06/2008  . CHRONIC RESPIRATORY DISEASE ARISE PERINTL PERIOD 03/06/2008  . HYPERLIPIDEMIA 02/06/2003    Past Surgical History:  Procedure Laterality Date  . BACK SURGERY  2015  . CHOLECYSTECTOMY N/A 10/12/2015   Procedure: LAPAROSCOPIC CHOLECYSTECTOMY;  Surgeon: Aviva Signs, MD;  Location: [facility];  Service: General;  Laterality: N/A;  . COLONOSCOPY    . FOOT FRACTURE SURGERY     left foot  . KNEE ARTHROSCOPY     left  . TONSILLECTOMY    . UPPER GASTROINTESTINAL ENDOSCOPY          Home Medications    Prior to Admission medications   Medication Sig Start Date End Date Taking? Authorizing Provider  ALPRAZolam Duanne Moron) 0.5 MG tablet Take 0.25-0.5 mg by mouth at bedtime as needed for sleep.    Yes [provider]  aspirin EC 81 MG tablet Take 81 mg by mouth daily.   Yes [provider]  ciprofloxacin (CIPRO) 500 MG tablet Take 1 tablet by mouth 2 (two) times daily. 07/09/17  Yes [provider]  doxazosin (CARDURA) 4 MG tablet Take 4 mg by mouth at bedtime.     Yes [provider]  escitalopram (LEXAPRO) 10 MG tablet Take 1 tablet by mouth daily. 10/09/16  Yes [provider]  hyoscyamine (LEVSIN SL) 0.125 MG SL tablet Place 1 tablet (0.125 mg total) under the tongue every 4 (  four) hours as needed. 05/05/16  Yes Levin Erp, PA  lisinopril (PRINIVIL,ZESTRIL) 10 MG tablet Take 10 mg by mouth every evening.    Yes [provider]  metoprolol tartrate (LOPRESSOR) 25 MG tablet Take 1 tablet (25 mg total) by mouth 2 (two) times daily. 05/05/14  Yes Black, Lezlie Octave, NP  metroNIDAZOLE (FLAGYL) 500 MG tablet Take 1 tablet by mouth 2 (two) times daily. 07/09/17  Yes [provider]  oxyCODONE-acetaminophen (PERCOCET/ROXICET) 5-325 MG tablet Take 1-2 tablets by mouth every 4 (four) hours as needed for moderate pain or severe pain. 10/12/15  Yes Aviva Signs, MD  pantoprazole (PROTONIX) 40 MG tablet Take 1 tablet (40 mg  total) by mouth 2 (two) times daily. Patient taking differently: Take 40 mg by mouth every morning.  10/07/11  Yes Ladene Artist, MD  pravastatin (PRAVACHOL) 20 MG tablet Take 20 mg by mouth every evening.    Yes [provider]  simethicone (MYLICON) 80 MG chewable tablet Chew 160 mg by mouth every 6 (six) hours as needed for flatulence.   Yes [provider]    Family History Family History  Problem Relation Age of Onset  . Arrhythmia Mother        Atrial fib  . Arrhythmia Sister        Atrial fib  . Stroke Sister   . Breast cancer Sister   . Bladder Cancer Father           . Colon cancer Neg Hx   . Stomach cancer Neg Hx   . Esophageal cancer Neg Hx   . Pancreatic cancer Neg Hx   . Prostate cancer Neg Hx   . Rectal cancer Neg Hx     Social History Social History   Tobacco Use  . Smoking status: Former Smoker    Packs/day: 2.00    Types: Cigarettes    Start date: 05/22/1962    Last attempt to quit: 05/21/1973    Years since quitting: 44.1  . Smokeless tobacco: Never Used  . Tobacco comment: Quit 25 yrs now   Substance Use Topics  . Alcohol use: Yes    Alcohol/week: 8.4 oz    Types: 14 Shots of liquor per week  . Drug use: No     Allergies   Cephalexin and Contrast media [iodinated diagnostic agents]   Review of Systems Review of Systems  Cardiovascular: Positive for chest pain.  Gastrointestinal: Negative for abdominal pain.  All other systems reviewed and are negative.    Physical Exam Updated Vital Signs BP 126/79   Pulse 65   Temp 98 F (36.7 C) (Oral)   Resp 12   Ht 5\' 11"  (1.803 m)   Wt 124.7 kg (275 lb)   SpO2 96%   BMI 38.35 kg/m   Physical Exam  Constitutional: He is oriented to person, place, and time. He appears well-developed and well-nourished.  HENT:  Head: Normocephalic.  Eyes: EOM are normal.  Neck: Normal range of motion.  Cardiovascular: Regular rhythm and normal pulses.  Pulmonary/Chest: Effort normal. He  has no decreased breath sounds.  Abdominal: Soft. He exhibits no distension.  Musculoskeletal: Normal range of motion.       Right lower leg: Normal. He exhibits no tenderness and no edema.       Left lower leg: Normal. He exhibits no tenderness and no edema.  Neurological: He is alert and oriented to person, place, and time.  Skin: Skin is warm.  Psychiatric: He  has a normal mood and affect.  Nursing note and vitals reviewed.    ED Treatments / Results  Labs (all labs ordered are listed, but only abnormal results are displayed) Labs Reviewed  BASIC METABOLIC PANEL - Abnormal; Notable for the following components:      Result Value   Glucose, Bld 111 (*)    Creatinine, Ser 1.29 (*)    GFR calc non Af Amer 54 (*)    All other components within normal limits  D-DIMER, QUANTITATIVE (NOT AT [facility]) - Abnormal; Notable for the following components:   D-Dimer, Quant 0.75 (*)    All other components within normal limits  CBC  TROPONIN I    EKG EKG Interpretation  Date/Time:  Saturday July 11 2017 09:45:57 EDT Ventricular Rate:  65 PR Interval:    QRS Duration: 109 QT Interval:  397 QTC Calculation: 413 R Axis:   44 Text Interpretation:  Sinus rhythm Low voltage, precordial leads Baseline wander in lead(s) III Confirmed by Nat Christen [phone]) on 07/11/2017 12:31:22 PM   Radiology Dg Chest 2 View  Result Date: 07/11/2017 CLINICAL DATA:  Worsening left-sided chest pain since Tuesday EXAM: CHEST - 2 VIEW COMPARISON:  04/03/2016 FINDINGS: Chronic generalized coarse lung opacity. Stable mild cardiomegaly. Stable vascular pedicle widening. No Kerley line, air bronchogram, effusion, or pneumothorax. IMPRESSION: Chronic lung disease and cardiomegaly. No definite acute disease when compared to 2018 Electronically Signed   By: Monte Fantasia M.D.   On: 07/11/2017 10:36    Procedures Procedures (including critical care time)  Medications Ordered in ED Medications  diphenhydrAMINE  (BENADRYL) capsule 50 mg (50 mg Oral Given 07/11/17 1446)  hydrocortisone sodium succinate (SOLU-CORTEF) 100 MG injection 200 mg (200 mg Intravenous Given 07/11/17 1131)  oxyCODONE-acetaminophen (PERCOCET/ROXICET) 5-325 MG per tablet 1 tablet (1 tablet Oral Given 07/11/17 1150)  iopamidol (ISOVUE-370) 76 % injection 100 mL (100 mLs Intravenous Contrast Given 07/11/17 1523)     Initial Impression / Assessment and Plan / ED Course  I have reviewed the triage vital signs and the nursing notes.  Pertinent labs & imaging results that were available during my care of the patient were reviewed by me and considered in my medical decision making (see chart for details).     MDM  EKg normal, Chest xray is normal  Pt has an elevated dDimer Pt is allergic to contrast dye.  Pt given Iv solucortef and benadryl.  Ct scan obtained and is negative for PE.    Final Clinical Impressions(s) / ED Diagnoses   Final diagnoses:  Chest wall pain    ED Discharge Orders    None    An After Visit Summary was printed and given to the patient.   Fransico Meadow, PA-C 07/11/17 1622    Nat Christen, MD 07/12/17 [phone]

## 2017-07-11 NOTE — ED Triage Notes (Signed)
Pt reports left sided chest pain increasing since Tuesday.  Worsens with deep breathing and palpation.

## 2017-07-11 NOTE — Discharge Instructions (Signed)
See your Physician for recheck on Monday °

## 2017-07-15 DIAGNOSIS — J029 Acute pharyngitis, unspecified: Secondary | ICD-10-CM | POA: Diagnosis not present

## 2017-07-15 DIAGNOSIS — J069 Acute upper respiratory infection, unspecified: Secondary | ICD-10-CM | POA: Diagnosis not present

## 2017-07-15 DIAGNOSIS — Z1389 Encounter for screening for other disorder: Secondary | ICD-10-CM | POA: Diagnosis not present

## 2017-07-15 DIAGNOSIS — Z6839 Body mass index (BMI) 39.0-39.9, adult: Secondary | ICD-10-CM | POA: Diagnosis not present

## 2017-08-14 ENCOUNTER — Other Ambulatory Visit: Payer: Self-pay | Admitting: Nurse Practitioner

## 2017-08-14 DIAGNOSIS — M47816 Spondylosis without myelopathy or radiculopathy, lumbar region: Secondary | ICD-10-CM | POA: Diagnosis not present

## 2017-08-14 DIAGNOSIS — M5416 Radiculopathy, lumbar region: Secondary | ICD-10-CM | POA: Diagnosis not present

## 2017-09-03 ENCOUNTER — Ambulatory Visit
Admission: RE | Admit: 2017-09-03 | Discharge: 2017-09-03 | Disposition: A | Discharge status: Home / Self Care | Payer: Medicare Other | Source: Ambulatory Visit | Attending: Nurse Practitioner | Admitting: Nurse Practitioner

## 2017-09-03 DIAGNOSIS — M5416 Radiculopathy, lumbar region: Secondary | ICD-10-CM

## 2017-09-03 DIAGNOSIS — M545 Low back pain: Secondary | ICD-10-CM | POA: Diagnosis not present

## 2017-09-03 MED ORDER — METHYLPREDNISOLONE ACETATE 40 MG/ML INJ SUSP (RADIOLOG
120.0000 mg | Freq: Once | INTRAMUSCULAR | Status: AC
  Administered 2017-09-03: 120 mg via EPIDURAL

## 2017-09-03 MED ORDER — IOPAMIDOL (ISOVUE-M 200) INJECTION 41%
1.0000 mL | Freq: Once | INTRAMUSCULAR | Status: AC
  Administered 2017-09-03: 1 mL via EPIDURAL

## 2017-09-07 ENCOUNTER — Telehealth: Payer: Self-pay | Admitting: Nurse Practitioner

## 2017-09-07 NOTE — Telephone Encounter (Signed)
Phone call from patient regarding rash on feet and under arms following NRB injection on Thursday. Pt states symptoms started Saturday and have continued until today. Pt started taking benadryl yesterday without relief. Per Dr Maree Erie, recommeneded pt continue taking benadryl for another 24-48hrs then call us back if symptoms persist. Pt verbalized understanding.

## 2017-09-11 DIAGNOSIS — N411 Chronic prostatitis: Secondary | ICD-10-CM | POA: Diagnosis not present

## 2017-09-11 DIAGNOSIS — N50819 Testicular pain, unspecified: Secondary | ICD-10-CM | POA: Diagnosis not present

## 2017-09-12 IMAGING — US US SOFT TISSUE HEAD/NECK
1 series · 13 of 25 positions shown · non-contrast
Comparison: Prior thyroid ultrasound 10/30/2014

CLINICAL DATA: 70-year-old male with a history of nontoxic
multinodular goiter.

EXAM:
THYROID ULTRASOUND
TECHNIQUE: Ultrasound examination of the thyroid gland and adjacent soft
tissues was performed.

[Series 1: us soft tissue head/neck · 0.07mm/px · 13 of 72 slices shown]
[im 1/72]
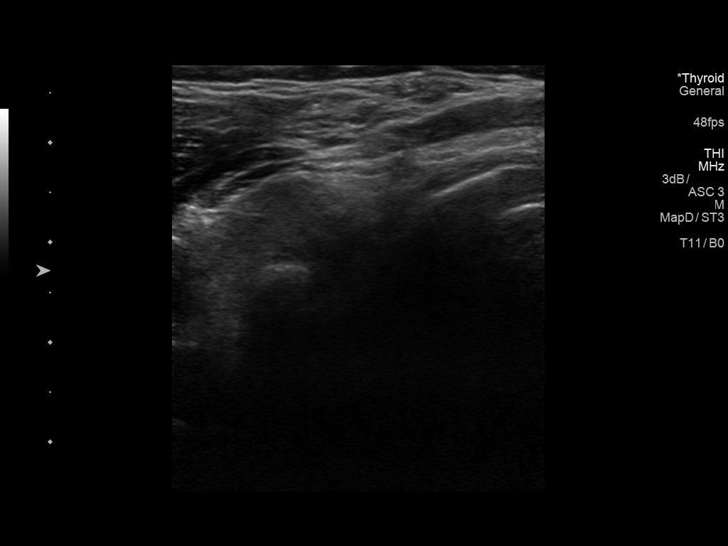
[im 6/72]
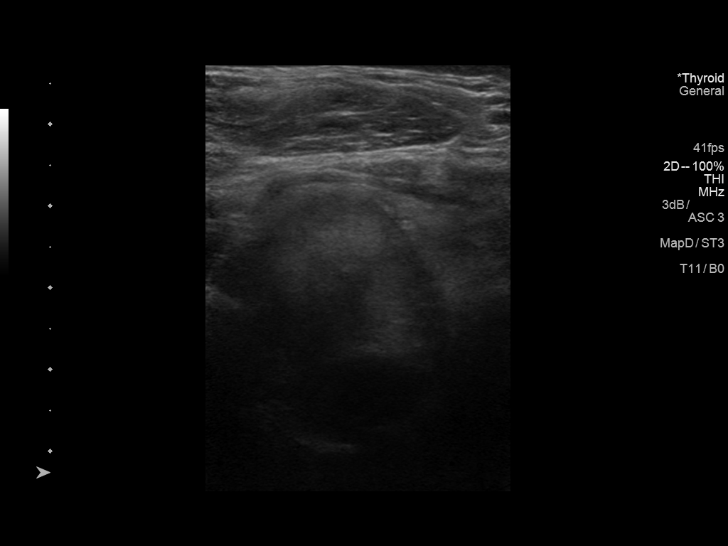
[im 12/72]
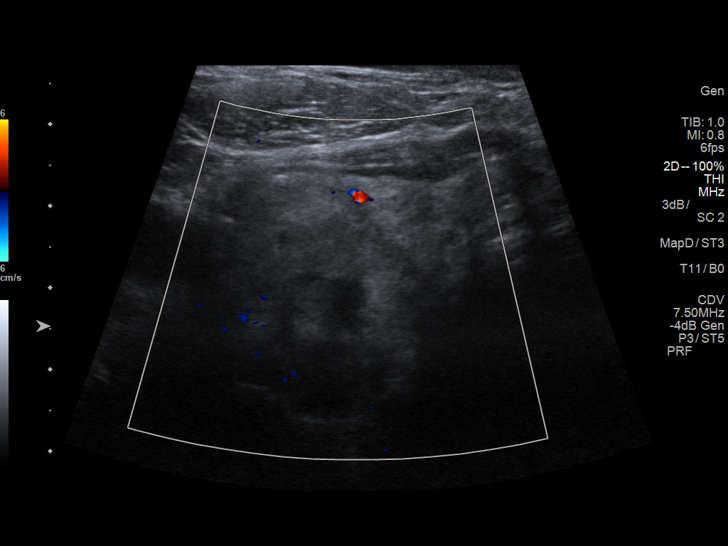
[im 18/72]
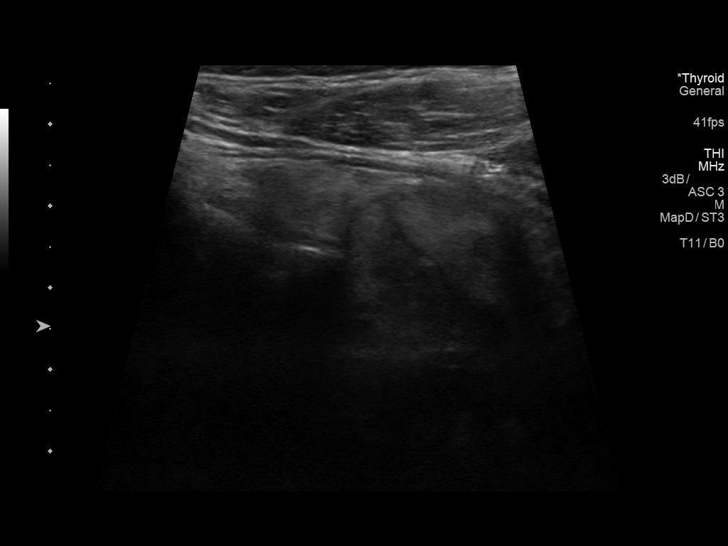
[im 24/72]
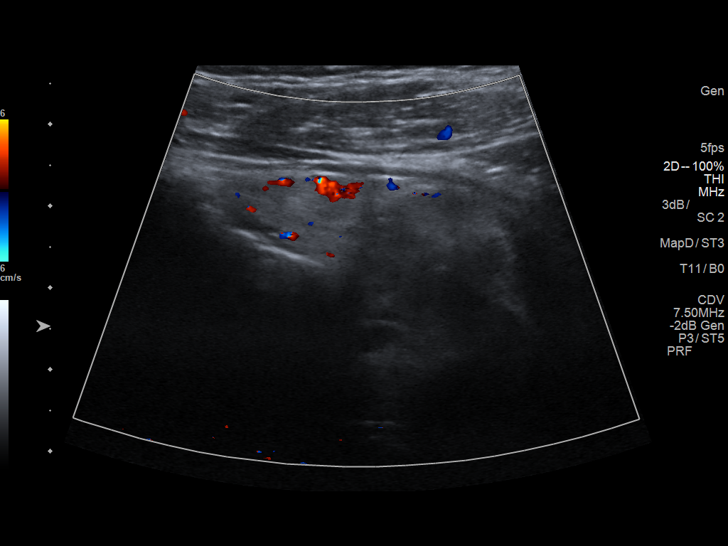
[im 30/72]
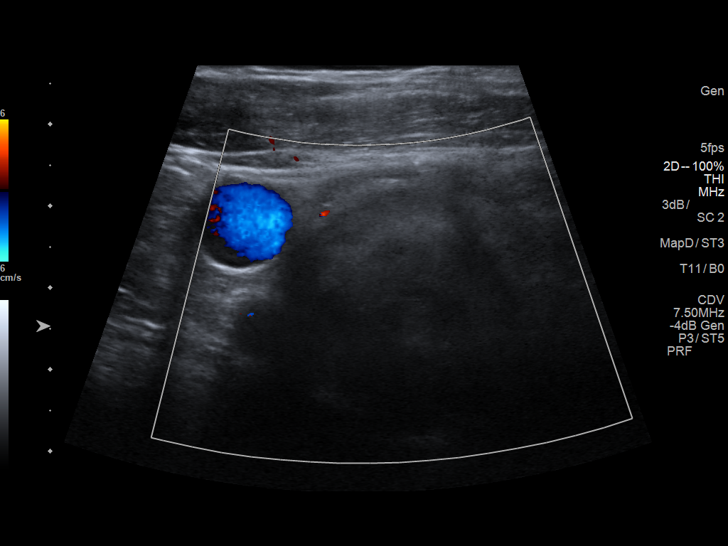
[im 36/72]
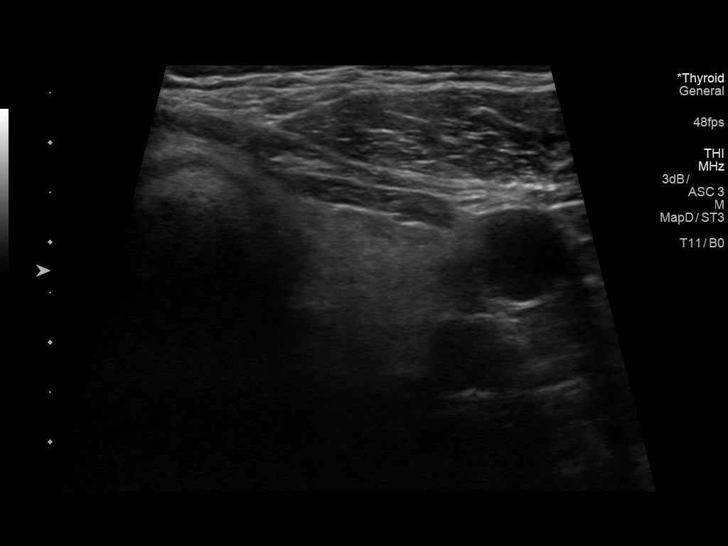
[im 42/72]
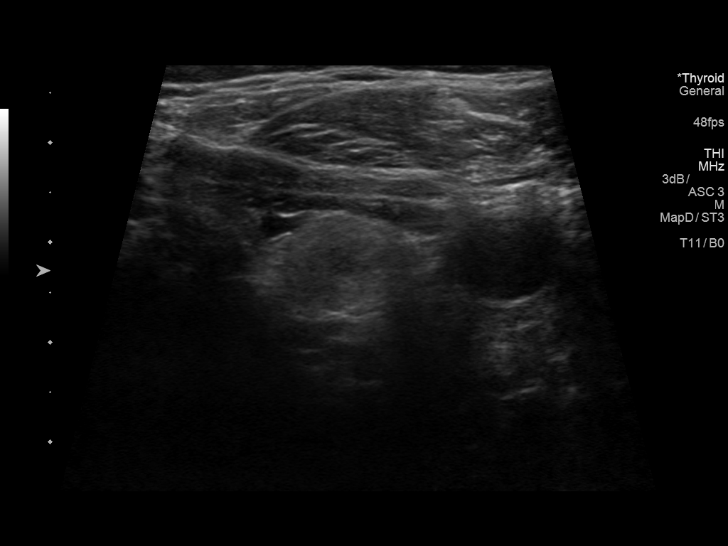
[im 48/72]
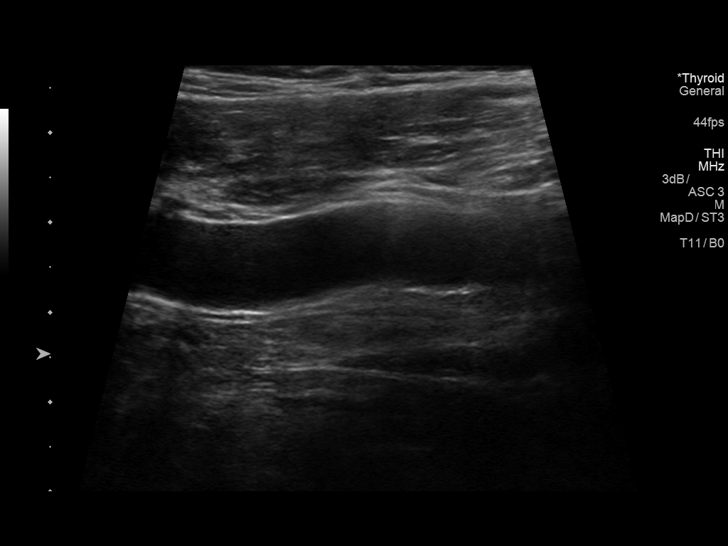
[im 54/72]
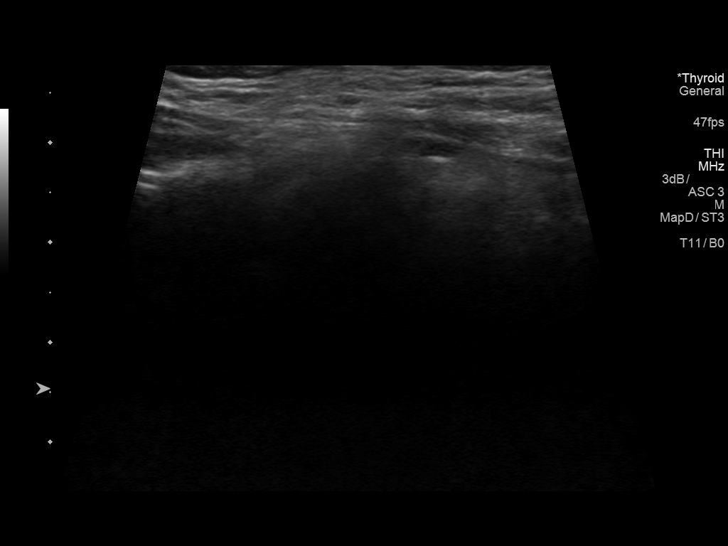
[im 60/72]
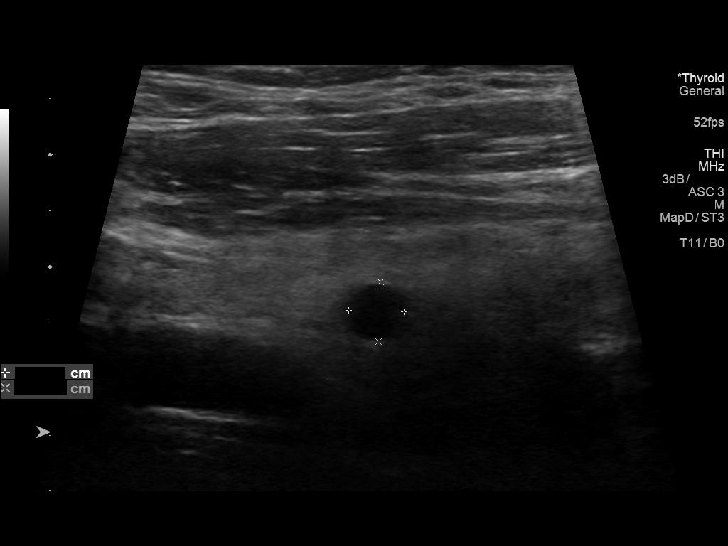
[im 66/72]
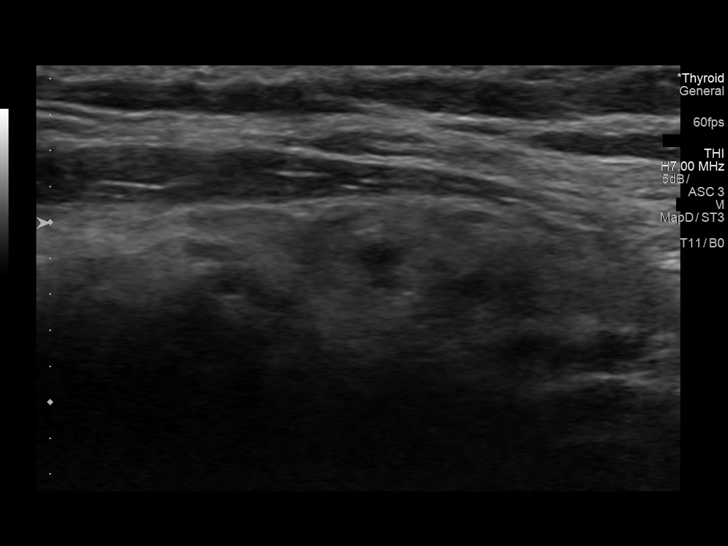
[im 72/72]
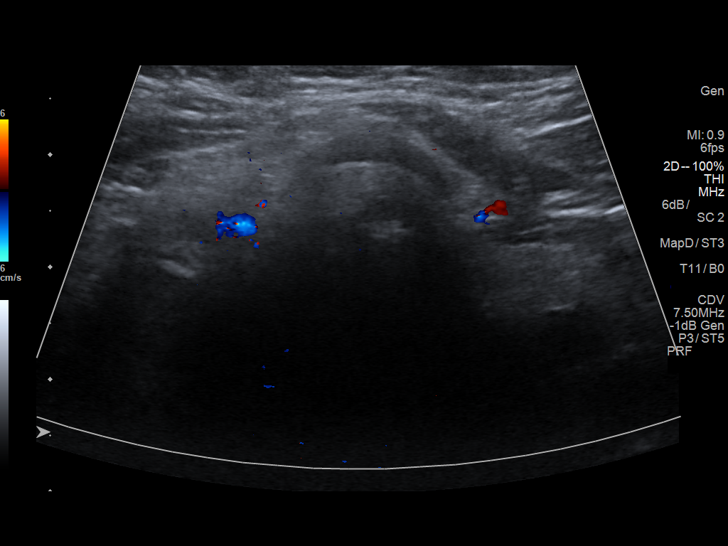

[13 of 25 positions shown; findings below may reference images not displayed]

FINDINGS: Parenchymal Echotexture: Moderately heterogenous

Estimated total number of nodules >/= 1 cm: 2

Number of spongiform nodules >/=  2 cm not described below (TR1): 0

Number of mixed cystic and solid nodules >/= 1.5 cm not described
below (TR2): 0

_________________________________________________________

Isthmus: 0.3 cm

Nodule # 1:

Location: Isthmus; Mid

Size: 1.3 x 0.6 x 1.0 cm

Composition: solid/almost completely solid (2)

Echogenicity: isoechoic (1)

Shape: not taller-than-wide (0)

Margins: ill-defined (0)

Echogenic foci: none (0)

ACR TI-RADS total points: 3.

ACR TI-RADS risk category: TR3 (3 points).

ACR TI-RADS recommendations:

Given size (<1.4 cm) and appearance, this nodule does NOT meet
TI-RADS criteria for biopsy or dedicated follow-up.

_________________________________________________________

Right lobe: 5.6 x 3.1 x 2.8 cm

Nodule # 1:

Prior biopsy: No

Location: Right; Mid

Size: 3.1 x 2.7 x 2.9 cm (previously 2.9 x 2.7 x 2.6 cm)

Composition: solid/almost completely solid (2)

Echogenicity: hypoechoic (2)

Shape: taller-than-wide (3)

Margins: ill-defined (0)

Echogenic foci: none (0)

ACR TI-RADS total points: 7.

ACR TI-RADS risk category: TR5 (>/= 7 points).

Change in features: No

Change in ACR TI-RADS risk category: No

ACR TI-RADS recommendations:

**Given size (>/= 1.0 cm) and appearance, fine needle aspiration of
this highly suspicious nodule should be considered based on TI-RADS
criteria.

_________________________________________________________

Left lobe: 4.9 x 1.7 x 1.7 cm

Incidental note is made of a subcentimeter hypoechoic cyst or nodule
in the mid gland. This does not meet criteria for dedicated
follow-up.
IMPRESSION: 1. Right Nodule #1: ACR TI-RADS 5. Recommend: Ultrasound-guided fine
needle aspiration.
2. Additional isthmic and left-sided thyroid nodules are small
and/or benign in appearance and do not meet criteria for either
biopsy or dedicated imaging follow-up.
The above is in keeping with the ACR TI-RADS recommendations - [HOSPITAL] 8235;[DATE].

## 2017-10-12 DIAGNOSIS — J329 Chronic sinusitis, unspecified: Secondary | ICD-10-CM | POA: Diagnosis not present

## 2017-10-12 DIAGNOSIS — Z6839 Body mass index (BMI) 39.0-39.9, adult: Secondary | ICD-10-CM | POA: Diagnosis not present

## 2017-10-12 DIAGNOSIS — I10 Essential (primary) hypertension: Secondary | ICD-10-CM | POA: Diagnosis not present

## 2017-10-27 DIAGNOSIS — Z23 Encounter for immunization: Secondary | ICD-10-CM | POA: Diagnosis not present

## 2017-10-27 DIAGNOSIS — Z6838 Body mass index (BMI) 38.0-38.9, adult: Secondary | ICD-10-CM | POA: Diagnosis not present

## 2017-10-27 DIAGNOSIS — F419 Anxiety disorder, unspecified: Secondary | ICD-10-CM | POA: Diagnosis not present

## 2017-10-27 DIAGNOSIS — K5732 Diverticulitis of large intestine without perforation or abscess without bleeding: Secondary | ICD-10-CM | POA: Diagnosis not present

## 2017-10-27 DIAGNOSIS — Z1389 Encounter for screening for other disorder: Secondary | ICD-10-CM | POA: Diagnosis not present

## 2017-10-27 DIAGNOSIS — G894 Chronic pain syndrome: Secondary | ICD-10-CM | POA: Diagnosis not present

## 2017-10-27 DIAGNOSIS — L448 Other specified papulosquamous disorders: Secondary | ICD-10-CM | POA: Diagnosis not present

## 2017-10-27 DIAGNOSIS — N19 Unspecified kidney failure: Secondary | ICD-10-CM | POA: Diagnosis not present

## 2017-10-27 DIAGNOSIS — E785 Hyperlipidemia, unspecified: Secondary | ICD-10-CM | POA: Diagnosis not present

## 2017-10-27 DIAGNOSIS — R946 Abnormal results of thyroid function studies: Secondary | ICD-10-CM | POA: Diagnosis not present

## 2017-10-27 DIAGNOSIS — I10 Essential (primary) hypertension: Secondary | ICD-10-CM | POA: Diagnosis not present

## 2017-10-27 DIAGNOSIS — E782 Mixed hyperlipidemia: Secondary | ICD-10-CM | POA: Diagnosis not present

## 2017-10-27 DIAGNOSIS — Z125 Encounter for screening for malignant neoplasm of prostate: Secondary | ICD-10-CM | POA: Diagnosis not present

## 2017-10-27 DIAGNOSIS — Z0001 Encounter for general adult medical examination with abnormal findings: Secondary | ICD-10-CM | POA: Diagnosis not present

## 2017-11-19 DIAGNOSIS — M47816 Spondylosis without myelopathy or radiculopathy, lumbar region: Secondary | ICD-10-CM | POA: Diagnosis not present

## 2017-11-19 DIAGNOSIS — M5416 Radiculopathy, lumbar region: Secondary | ICD-10-CM | POA: Diagnosis not present

## 2017-11-23 ENCOUNTER — Other Ambulatory Visit (HOSPITAL_COMMUNITY): Payer: Self-pay | Admitting: Endocrinology

## 2017-11-23 DIAGNOSIS — E668 Other obesity: Secondary | ICD-10-CM | POA: Diagnosis not present

## 2017-11-23 DIAGNOSIS — K635 Polyp of colon: Secondary | ICD-10-CM | POA: Diagnosis not present

## 2017-11-23 DIAGNOSIS — E042 Nontoxic multinodular goiter: Secondary | ICD-10-CM | POA: Diagnosis not present

## 2017-11-23 DIAGNOSIS — K219 Gastro-esophageal reflux disease without esophagitis: Secondary | ICD-10-CM | POA: Diagnosis not present

## 2017-11-23 DIAGNOSIS — K589 Irritable bowel syndrome without diarrhea: Secondary | ICD-10-CM | POA: Diagnosis not present

## 2017-11-23 DIAGNOSIS — E041 Nontoxic single thyroid nodule: Secondary | ICD-10-CM

## 2017-11-23 DIAGNOSIS — N411 Chronic prostatitis: Secondary | ICD-10-CM | POA: Diagnosis not present

## 2017-11-23 DIAGNOSIS — D126 Benign neoplasm of colon, unspecified: Secondary | ICD-10-CM | POA: Diagnosis not present

## 2017-11-23 DIAGNOSIS — E7849 Other hyperlipidemia: Secondary | ICD-10-CM | POA: Diagnosis not present

## 2017-11-23 DIAGNOSIS — G4733 Obstructive sleep apnea (adult) (pediatric): Secondary | ICD-10-CM | POA: Diagnosis not present

## 2017-11-23 DIAGNOSIS — Z6838 Body mass index (BMI) 38.0-38.9, adult: Secondary | ICD-10-CM | POA: Diagnosis not present

## 2017-11-23 DIAGNOSIS — I10 Essential (primary) hypertension: Secondary | ICD-10-CM | POA: Diagnosis not present

## 2017-11-27 ENCOUNTER — Ambulatory Visit (HOSPITAL_COMMUNITY)
Admission: RE | Admit: 2017-11-27 | Discharge: 2017-11-27 | Disposition: A | Discharge status: Home / Self Care | Payer: Medicare Other | Source: Ambulatory Visit | Attending: Endocrinology | Admitting: Endocrinology

## 2017-11-27 DIAGNOSIS — E041 Nontoxic single thyroid nodule: Secondary | ICD-10-CM | POA: Diagnosis not present

## 2018-01-04 ENCOUNTER — Other Ambulatory Visit: Payer: Self-pay | Admitting: Nurse Practitioner

## 2018-01-04 DIAGNOSIS — M5416 Radiculopathy, lumbar region: Secondary | ICD-10-CM

## 2018-01-05 ENCOUNTER — Ambulatory Visit: Payer: Medicare Other | Admitting: "Endocrinology

## 2018-01-14 ENCOUNTER — Ambulatory Visit
Admission: RE | Admit: 2018-01-14 | Discharge: 2018-01-14 | Disposition: A | Discharge status: Home / Self Care | Payer: Medicare Other | Source: Ambulatory Visit | Attending: Nurse Practitioner | Admitting: Nurse Practitioner

## 2018-01-14 DIAGNOSIS — M545 Low back pain: Secondary | ICD-10-CM | POA: Diagnosis not present

## 2018-01-14 DIAGNOSIS — M5416 Radiculopathy, lumbar region: Secondary | ICD-10-CM

## 2018-01-14 MED ORDER — METHYLPREDNISOLONE ACETATE 40 MG/ML INJ SUSP (RADIOLOG
120.0000 mg | Freq: Once | INTRAMUSCULAR | Status: AC
  Administered 2018-01-14: 120 mg via EPIDURAL

## 2018-01-14 MED ORDER — IOPAMIDOL (ISOVUE-M 200) INJECTION 41%
10.0000 mL | Freq: Once | INTRAMUSCULAR | Status: AC
  Administered 2018-01-14: 10 mL via EPIDURAL

## 2018-01-19 DIAGNOSIS — J069 Acute upper respiratory infection, unspecified: Secondary | ICD-10-CM | POA: Diagnosis not present

## 2018-01-19 DIAGNOSIS — Z1389 Encounter for screening for other disorder: Secondary | ICD-10-CM | POA: Diagnosis not present

## 2018-01-19 DIAGNOSIS — Z6841 Body Mass Index (BMI) 40.0 and over, adult: Secondary | ICD-10-CM | POA: Diagnosis not present

## 2018-02-01 DIAGNOSIS — Z6839 Body mass index (BMI) 39.0-39.9, adult: Secondary | ICD-10-CM | POA: Diagnosis not present

## 2018-02-01 DIAGNOSIS — R05 Cough: Secondary | ICD-10-CM | POA: Diagnosis not present

## 2018-02-01 DIAGNOSIS — J209 Acute bronchitis, unspecified: Secondary | ICD-10-CM | POA: Diagnosis not present

## 2018-02-22 ENCOUNTER — Emergency Department (HOSPITAL_COMMUNITY): Payer: Medicare HMO

## 2018-02-22 ENCOUNTER — Other Ambulatory Visit: Payer: Self-pay

## 2018-02-22 ENCOUNTER — Encounter (HOSPITAL_COMMUNITY): Payer: Self-pay

## 2018-02-22 ENCOUNTER — Emergency Department (HOSPITAL_COMMUNITY)
Admission: EM | Admit: 2018-02-22 | Discharge: 2018-02-22 | Disposition: A | Discharge status: Home / Self Care | Payer: Medicare HMO | Attending: Emergency Medicine | Admitting: Emergency Medicine

## 2018-02-22 DIAGNOSIS — R109 Unspecified abdominal pain: Secondary | ICD-10-CM

## 2018-02-22 DIAGNOSIS — Z87891 Personal history of nicotine dependence: Secondary | ICD-10-CM | POA: Diagnosis not present

## 2018-02-22 DIAGNOSIS — I77819 Aortic ectasia, unspecified site: Secondary | ICD-10-CM | POA: Diagnosis not present

## 2018-02-22 DIAGNOSIS — Y998 Other external cause status: Secondary | ICD-10-CM | POA: Insufficient documentation

## 2018-02-22 DIAGNOSIS — I77811 Abdominal aortic ectasia: Secondary | ICD-10-CM | POA: Diagnosis not present

## 2018-02-22 DIAGNOSIS — Z79899 Other long term (current) drug therapy: Secondary | ICD-10-CM | POA: Insufficient documentation

## 2018-02-22 DIAGNOSIS — Z7982 Long term (current) use of aspirin: Secondary | ICD-10-CM | POA: Diagnosis not present

## 2018-02-22 DIAGNOSIS — I10 Essential (primary) hypertension: Secondary | ICD-10-CM | POA: Insufficient documentation

## 2018-02-22 LAB — COMPREHENSIVE METABOLIC PANEL
ALT: 23 U/L (ref 0–44)
AST: 17 U/L (ref 15–41)
Albumin: 4.1 g/dL (ref 3.5–5.0)
Alkaline Phosphatase: 51 U/L (ref 38–126)
Anion gap: 7 (ref 5–15)
BUN: 19 mg/dL (ref 8–23)
CO2: 26 mmol/L (ref 22–32)
Calcium: 9.3 mg/dL (ref 8.9–10.3)
Chloride: 103 mmol/L (ref 98–111)
Creatinine, Ser: 1.11 mg/dL (ref 0.61–1.24)
GFR calc Af Amer: >60 mL/min (ref >60)
GFR calc non Af Amer: >60 mL/min (ref >60)
Glucose, Bld: 108 mg/dL — ABNORMAL HIGH (ref 70–99)
Potassium: 4.1 mmol/L (ref 3.5–5.1)
Sodium: 136 mmol/L (ref 135–145)
Total Bilirubin: 0.8 mg/dL (ref 0.3–1.2)
Total Protein: 7.6 g/dL (ref 6.5–8.1)

## 2018-02-22 LAB — URINALYSIS, ROUTINE W REFLEX MICROSCOPIC
Bilirubin Urine: NEGATIVE
Glucose, UA: NEGATIVE mg/dL
Hgb urine dipstick: NEGATIVE
Ketones, ur: NEGATIVE mg/dL
Leukocytes, UA: NEGATIVE
Nitrite: NEGATIVE
PH: 5 (ref 5.0–8.0)
Protein, ur: NEGATIVE mg/dL
Specific Gravity, Urine: 1.011 (ref 1.005–1.030)

## 2018-02-22 LAB — CBC
HCT: 43.2 % (ref 39.0–52.0)
Hemoglobin: 14.2 g/dL (ref 13.0–17.0)
MCH: 32.6 pg (ref 26.0–34.0)
MCHC: 32.9 g/dL (ref 30.0–36.0)
MCV: 99.1 fL (ref 80.0–100.0)
NRBC: 0 % (ref 0.0–0.2)
Platelets: 234 10*3/uL (ref 150–400)
RBC: 4.36 MIL/uL (ref 4.22–5.81)
RDW: 13.4 % (ref 11.5–15.5)
WBC: 10.2 10*3/uL (ref 4.0–10.5)

## 2018-02-22 LAB — LIPASE, BLOOD: Lipase: 33 U/L (ref 11–51)

## 2018-02-22 MED ORDER — ONDANSETRON HCL 4 MG/2ML IJ SOLN
4.0000 mg | Freq: Once | INTRAMUSCULAR | Status: AC
  Administered 2018-02-22: 4 mg via INTRAVENOUS
  Filled 2018-02-22: qty 2

## 2018-02-22 MED ORDER — KETOROLAC TROMETHAMINE 30 MG/ML IJ SOLN
15.0000 mg | Freq: Once | INTRAMUSCULAR | Status: AC
  Administered 2018-02-22: 15 mg via INTRAVENOUS
  Filled 2018-02-22: qty 1

## 2018-02-22 MED ORDER — SODIUM CHLORIDE 0.9 % IV SOLN
INTRAVENOUS | Status: DC
  Administered 2018-02-22: 21:00:00 via INTRAVENOUS

## 2018-02-22 NOTE — ED Triage Notes (Signed)
Pt reports right upper abdominal pain that radiates to right flank . Pain started on Wednesday.

## 2018-02-22 NOTE — ED Provider Notes (Signed)
[facility] EMERGENCY DEPARTMENT Provider Note   CSN: [id number] Arrival date & time: 02/22/18  1754     History   Chief Complaint Chief Complaint  Patient presents with  . Abdominal Pain    HPI Alexander Nunez is a 73 y.o. male.  HPI   He presents for evaluation of right flank pain present for 3 days, worsening, as bad as 8/10 now 6/10 after taking oxycodone and hyoscyamine at home.  He has been having more gas passage recently.  He has had a fair appetite without nausea, vomiting, fever or chills.  He denies dysuria, urinary frequency, cough, shortness of breath or chest pain.  He has had similar pain in the past when he was passing a kidney stone.  There are no other known modifying factors.  Past Medical History:  Diagnosis Date  . Anxiety   . Arthritis    back  . Back pain, chronic   . Colon polyp   . Diverticulitis   . GERD (gastroesophageal reflux disease)   . Heart palpitations   . High cholesterol   . Hypertension   . Irritable bowel syndrome   . Osteoporosis   . Psoriasis   . Sleep apnea    c- pap    Patient Active Problem List   Diagnosis Date Noted  . Atypical chest pain 05/04/2014  . Sleep apnea 05/04/2014  . Abdominal bloating 07/22/2013  . Spermatocele 05/02/2013  . Chronic prostatitis 01/31/2013  . Pain in testicle 01/31/2013  . Weakness of left leg 08/12/2012  . Generalized abdominal pain 09/02/2011  . Lumbar canal stenosis 05/28/2011  . Degenerative arthritis of lumbar spine 05/28/2011  . SPL (spondylolisthesis) 05/28/2011  . PERSONAL HX COLONIC POLYPS 04/19/2009  . IRRITABLE BOWEL SYNDROME 04/06/2008  . ABDOMINAL BLOATING 03/07/2008  . CHANGE IN BOWELS 03/07/2008  . ABDOMINAL PAIN-MULTIPLE SITES 03/07/2008  . Essential hypertension 03/06/2008  . GERD 03/06/2008  . GASTRITIS 03/06/2008  . CHRONIC RESPIRATORY DISEASE ARISE PERINTL PERIOD 03/06/2008  . HYPERLIPIDEMIA 02/06/2003    Past Surgical History:  Procedure Laterality Date  .  BACK SURGERY  2015  . CHOLECYSTECTOMY N/A 10/12/2015   Procedure: LAPAROSCOPIC CHOLECYSTECTOMY;  Surgeon: Aviva Signs, MD;  Location: [facility];  Service: General;  Laterality: N/A;  . COLONOSCOPY    . FOOT FRACTURE SURGERY     left foot  . KNEE ARTHROSCOPY     left  . TONSILLECTOMY    . UPPER GASTROINTESTINAL ENDOSCOPY          Home Medications    Prior to Admission medications   Medication Sig Start Date End Date Taking? Authorizing Provider  ALPRAZolam Duanne Moron) 0.5 MG tablet Take 0.25-0.5 mg by mouth at bedtime as needed for sleep.    Yes [provider]  aspirin EC 81 MG tablet Take 81 mg by mouth daily.   Yes [provider]  Bacillus Coagulans-Inulin (PROBIOTIC FORMULA PO) Take 1 capsule by mouth every morning.   Yes [provider]  doxazosin (CARDURA) 4 MG tablet Take 4 mg by mouth at bedtime.     Yes [provider]  escitalopram (LEXAPRO) 10 MG tablet Take 1 tablet by mouth daily. 10/09/16  Yes [provider]  hyoscyamine (LEVSIN SL) 0.125 MG SL tablet Place 1 tablet (0.125 mg total) under the tongue every 4 (four) hours as needed. Patient taking differently: Place 0.125 mg under the tongue every 4 (four) hours as needed for cramping.  05/05/16  Yes Levin Erp, PA  lisinopril (PRINIVIL,ZESTRIL) 10 MG tablet Take 10 mg by mouth every evening.    Yes [provider]  metoprolol tartrate (LOPRESSOR) 25 MG tablet Take 1 tablet (25 mg total) by mouth 2 (two) times daily. 05/05/14  Yes Black, Lezlie Octave, NP  oxyCODONE-acetaminophen (PERCOCET/ROXICET) 5-325 MG tablet Take 1-2 tablets by mouth every 4 (four) hours as needed for moderate pain or severe pain. 10/12/15  Yes Aviva Signs, MD  pantoprazole (PROTONIX) 40 MG tablet Take 1 tablet (40 mg total) by mouth 2 (two) times daily. Patient taking differently: Take 40 mg by mouth every morning.  10/07/11  Yes Ladene Artist, MD  pravastatin (PRAVACHOL) 20 MG tablet Take 20 mg by  mouth every evening.    Yes [provider]  Simethicone (GAS-X EXTRA STRENGTH) 125 MG CAPS Take 250 mg by mouth daily as needed (for flatulence).   Yes [provider]  amoxicillin-clavulanate (AUGMENTIN) 875-125 MG tablet Take 1 tablet by mouth 2 (two) times daily. 10 day course completed on 02/18/2018 02/08/18   [provider]    Family History Family History  Problem Relation Age of Onset  . Arrhythmia Mother        Atrial fib  . Arrhythmia Sister        Atrial fib  . Stroke Sister   . Breast cancer Sister   . Bladder Cancer Father           . Colon cancer Neg Hx   . Stomach cancer Neg Hx   . Esophageal cancer Neg Hx   . Pancreatic cancer Neg Hx   . Prostate cancer Neg Hx   . Rectal cancer Neg Hx     Social History Social History   Tobacco Use  . Smoking status: Former Smoker    Packs/day: 2.00    Types: Cigarettes    Start date: 05/22/1962    Last attempt to quit: 05/21/1973    Years since quitting: 44.7  . Smokeless tobacco: Never Used  . Tobacco comment: Quit 25 yrs now   Substance Use Topics  . Alcohol use: Yes    Alcohol/week: 14.0 standard drinks    Types: 14 Shots of liquor per week    Comment: social  . Drug use: No     Allergies   Cephalexin and Contrast media [iodinated diagnostic agents]   Review of Systems Review of Systems  All other systems reviewed and are negative.    Physical Exam Updated Vital Signs BP (!) 123/43   Pulse 81   Temp 97.7 F (36.5 C) (Oral)   Resp 18   Ht 5\' 11"  (1.803 m)   Wt 123.4 kg   SpO2 95%   BMI 37.94 kg/m   Physical Exam Vitals signs and nursing note reviewed.  Constitutional:      General: He is in acute distress.     Appearance: He is well-developed. He is obese. He is not ill-appearing or diaphoretic.  HENT:     Head: Normocephalic and atraumatic.     Right Ear: External ear normal.     Left Ear: External ear normal.     Nose: Nose normal. No congestion.      Mouth/Throat:     Mouth: Mucous membranes are moist.     Pharynx: No oropharyngeal exudate.  Eyes:     Conjunctiva/sclera: Conjunctivae normal.     Pupils: Pupils are equal, round, and reactive to light.  Neck:     Musculoskeletal: Normal range of motion and neck  supple.     Trachea: Phonation normal.  Cardiovascular:     Rate and Rhythm: Normal rate and regular rhythm.     Heart sounds: Normal heart sounds.  Pulmonary:     Effort: Pulmonary effort is normal.     Breath sounds: Normal breath sounds.  Abdominal:     General: There is no distension.     Palpations: Abdomen is soft.     Tenderness: There is abdominal tenderness (Right upper and lower quadrants, mild).  Musculoskeletal: Normal range of motion.        General: No swelling, tenderness or signs of injury.     Right lower leg: No edema.     Left lower leg: No edema.  Skin:    General: Skin is warm and dry.  Neurological:     Mental Status: He is alert and oriented to person, place, and time.     Cranial Nerves: No cranial nerve deficit.     Sensory: No sensory deficit.     Motor: No abnormal muscle tone.     Coordination: Coordination normal.  Psychiatric:        Mood and Affect: Mood normal.        Behavior: Behavior normal.        Thought Content: Thought content normal.        Judgment: Judgment normal.      ED Treatments / Results  Labs (all labs ordered are listed, but only abnormal results are displayed) Labs Reviewed  COMPREHENSIVE METABOLIC PANEL - Abnormal; Notable for the following components:      Result Value   Glucose, Bld 108 (*)    All other components within normal limits  LIPASE, BLOOD  CBC  URINALYSIS, ROUTINE W REFLEX MICROSCOPIC    EKG None  Radiology Ct Renal Stone Study  Result Date: 02/22/2018 CLINICAL DATA:  Right flank pain. EXAM: CT ABDOMEN AND PELVIS WITHOUT CONTRAST TECHNIQUE: Multidetector CT imaging of the abdomen and pelvis was performed following the standard protocol  without IV contrast. COMPARISON:  CT abdomen pelvis dated January 06, 2018. FINDINGS: Lower chest: No acute abnormality. Mild chronic interstitial changes at the lung bases are similar to prior studies. Hepatobiliary: No focal liver abnormality is seen. Unchanged tiny calcified granulomas. Status post cholecystectomy. No biliary dilatation. Pancreas: Unremarkable. No pancreatic ductal dilatation or surrounding inflammatory changes. Spleen: Normal in size without focal abnormality. Adrenals/Urinary Tract: The adrenal glands are unremarkable. Unchanged mild bilateral renal cortical atrophy. No renal or ureteral calculi. No hydronephrosis. The bladder is unremarkable for the degree of distention. Stomach/Bowel: Stomach is within normal limits. Appendix appears normal. No evidence of bowel wall thickening, distention, or inflammatory changes. Unchanged left-sided colonic diverticulosis. Vascular/Lymphatic: Aortic atherosclerosis. Unchanged ectasia of the infrarenal abdominal aorta measuring up to 2.8 cm. No enlarged abdominal or pelvic lymph nodes. Reproductive: Unchanged mild prostatomegaly. Other: No abdominal wall hernia or abnormality. No abdominopelvic ascites. No pneumoperitoneum. Musculoskeletal: No acute or significant osseous findings. Prior L4-L5 fusion. IMPRESSION: 1.  No acute intra-abdominal process.  No urolithiasis. 2. Stable ectatic abdominal aorta at risk for aneurysm development. Recommend followup by ultrasound in 5 years. This recommendation follows ACR consensus guidelines: White Paper of the ACR Incidental Findings Committee II on Vascular Findings. J Am Coll Radiol 2013; 10:789-794. Electronically Signed   By: Titus Dubin M.D.   On: 02/22/2018 20:46    Procedures Procedures (including critical care time)  Medications Ordered in ED Medications  0.9 %  sodium chloride infusion ( Intravenous New  Bag/Given 02/22/18 2050)  ketorolac (TORADOL) 30 MG/ML injection 15 mg (15 mg Intravenous  Given 02/22/18 2052)  ondansetron (ZOFRAN) injection 4 mg (4 mg Intravenous Given 02/22/18 2051)     Initial Impression / Assessment and Plan / ED Course  I have reviewed the triage vital signs and the nursing notes.  Pertinent labs & imaging results that were available during my care of the patient were reviewed by me and considered in my medical decision making (see chart for details).  Clinical Course as of Feb 22 2234  Mon Feb 22, 2018  2234 Normal  Urinalysis, Routine w reflex microscopic [EW]  2235 Normal  Lipase, blood [EW]  2235 Normal  CBC [EW]  2235 Normal  Comprehensive metabolic panel(!) [EW]  1696 No urolithiasis, bowel obstruction or acute intra-abdominal process.  Incidental ectatic aorta, at risk for development of aneurysm.  Images reviewed by me   [EW]    Clinical Course User Index [EW] Daleen Bo, MD     Patient Vitals for the past 24 hrs:  BP Temp Temp src Pulse Resp SpO2 Height Weight  02/22/18 2130 (!) 123/43 - - 81 - 95 % - -  02/22/18 2100 (!) 126/55 - - 73 - 95 % - -  02/22/18 2054 (!) 108/46 - - 74 - 96 % - -  02/22/18 1820 - - - - - - 5\' 11"  (1.803 m) 123.4 kg  02/22/18 1819 (!) 144/91 97.7 F (36.5 C) Oral 75 18 96 % - -    10:36 PM Reevaluation with update and discussion. After initial assessment and treatment, an updated evaluation reveals he states he is feeling better and has no further complaints.  Findings discussed and questions answered. Daleen Bo   Medical Decision Making: Abdominal pain, nonspecific.  No evidence for kidney stone, urinary tract infection intra-abdominal acute abnormality.  Doubt serious bacterial infection or impending vascular collapse.  CRITICAL CARE-no Performed by: Daleen Bo  Nursing Notes Reviewed/ Care Coordinated Applicable Imaging Reviewed Interpretation of Laboratory Data incorporated into ED treatment  The patient appears reasonably screened and/or stabilized for discharge and I doubt any other  medical condition or other [facility] requiring further screening, evaluation, or treatment in the ED at this time prior to discharge.  Plan: Home Medications-continue usual medications Tylenol for pain; Home Treatments-rest, gradually advance diet; return here if the recommended treatment, does not improve the symptoms; Recommended follow up-PCP, PRN   Final Clinical Impressions(s) / ED Diagnoses   Final diagnoses:  Abdominal pain, unspecified abdominal location  Ectatic aorta [facility])    ED Discharge Orders    None       Daleen Bo, MD 02/22/18 2247

## 2018-02-22 NOTE — Discharge Instructions (Addendum)
The testing did not show any source for the pain which you are having.  Therefore this is likely pain from your IBS.  The CAT scan did show an incidental problem that is likely been there for a while.  This is an irregular-appearing aorta in your abdomen.  To follow-up on that we recommend that you get an ultrasound done in 5 years to see if it has progressed to form an aneurysm.  There is a risk that this type of problem can form an aneurysm but the risk is low.  It is important to follow-up to make sure that the aneurysm does not form.  For the pain which you are having, take Tylenol and use your usual medications.  Return here if needed for problems.

## 2018-03-04 DIAGNOSIS — I10 Essential (primary) hypertension: Secondary | ICD-10-CM | POA: Diagnosis not present

## 2018-03-04 DIAGNOSIS — M47814 Spondylosis without myelopathy or radiculopathy, thoracic region: Secondary | ICD-10-CM | POA: Diagnosis not present

## 2018-03-04 DIAGNOSIS — Z6841 Body Mass Index (BMI) 40.0 and over, adult: Secondary | ICD-10-CM | POA: Diagnosis not present

## 2018-03-04 DIAGNOSIS — M546 Pain in thoracic spine: Secondary | ICD-10-CM | POA: Diagnosis not present

## 2018-03-04 DIAGNOSIS — M5414 Radiculopathy, thoracic region: Secondary | ICD-10-CM | POA: Diagnosis not present

## 2018-03-10 DIAGNOSIS — Z6839 Body mass index (BMI) 39.0-39.9, adult: Secondary | ICD-10-CM | POA: Diagnosis not present

## 2018-03-10 DIAGNOSIS — K219 Gastro-esophageal reflux disease without esophagitis: Secondary | ICD-10-CM | POA: Diagnosis not present

## 2018-03-10 DIAGNOSIS — M47814 Spondylosis without myelopathy or radiculopathy, thoracic region: Secondary | ICD-10-CM | POA: Diagnosis not present

## 2018-03-10 DIAGNOSIS — M5414 Radiculopathy, thoracic region: Secondary | ICD-10-CM | POA: Diagnosis not present

## 2018-03-17 ENCOUNTER — Other Ambulatory Visit (HOSPITAL_COMMUNITY): Payer: Self-pay | Admitting: Internal Medicine

## 2018-03-17 DIAGNOSIS — M549 Dorsalgia, unspecified: Secondary | ICD-10-CM

## 2018-03-19 DIAGNOSIS — M5414 Radiculopathy, thoracic region: Secondary | ICD-10-CM | POA: Diagnosis not present

## 2018-03-24 ENCOUNTER — Ambulatory Visit (HOSPITAL_COMMUNITY)
Admission: RE | Admit: 2018-03-24 | Discharge: 2018-03-24 | Disposition: A | Discharge status: Home / Self Care | Payer: Medicare HMO | Source: Ambulatory Visit | Attending: Internal Medicine | Admitting: Internal Medicine

## 2018-03-24 DIAGNOSIS — M549 Dorsalgia, unspecified: Secondary | ICD-10-CM | POA: Diagnosis not present

## 2018-03-24 DIAGNOSIS — M47814 Spondylosis without myelopathy or radiculopathy, thoracic region: Secondary | ICD-10-CM | POA: Diagnosis not present

## 2018-03-24 DIAGNOSIS — M5124 Other intervertebral disc displacement, thoracic region: Secondary | ICD-10-CM | POA: Diagnosis not present

## 2018-04-13 DIAGNOSIS — J069 Acute upper respiratory infection, unspecified: Secondary | ICD-10-CM | POA: Diagnosis not present

## 2018-04-13 DIAGNOSIS — Z6841 Body Mass Index (BMI) 40.0 and over, adult: Secondary | ICD-10-CM | POA: Diagnosis not present

## 2018-04-13 DIAGNOSIS — Z1389 Encounter for screening for other disorder: Secondary | ICD-10-CM | POA: Diagnosis not present

## 2018-04-15 DIAGNOSIS — M940 Chondrocostal junction syndrome [Tietze]: Secondary | ICD-10-CM | POA: Diagnosis not present

## 2018-04-28 ENCOUNTER — Telehealth: Payer: Self-pay | Admitting: Gastroenterology

## 2018-04-28 MED ORDER — HYOSCYAMINE SULFATE 0.125 MG SL SUBL: 0.1250 mg | SUBLINGUAL_TABLET | SUBLINGUAL | 1 refills | Status: DC | PRN

## 2018-04-28 NOTE — Telephone Encounter (Signed)
Patient states hyoscyamine has helped in the past with abdominal bloating and cramping. Patient states he cancelled his appt due to covid-19 and wants to have that medication refilled. Informed patient that we will refill the medication but to call our office back if his symptoms do not improve and we will schedule him for a telephone visit with Dr. Fuller Plan. Patient agreed and verbalized understanding.

## 2018-05-05 ENCOUNTER — Ambulatory Visit: Payer: Medicare HMO | Admitting: Gastroenterology

## 2018-05-13 DIAGNOSIS — M940 Chondrocostal junction syndrome [Tietze]: Secondary | ICD-10-CM | POA: Diagnosis not present

## 2018-05-13 DIAGNOSIS — M5414 Radiculopathy, thoracic region: Secondary | ICD-10-CM | POA: Diagnosis not present

## 2018-05-22 ENCOUNTER — Other Ambulatory Visit: Payer: Self-pay | Admitting: Gastroenterology

## 2018-05-25 DIAGNOSIS — N411 Chronic prostatitis: Secondary | ICD-10-CM | POA: Diagnosis not present

## 2018-05-25 DIAGNOSIS — G4733 Obstructive sleep apnea (adult) (pediatric): Secondary | ICD-10-CM | POA: Diagnosis not present

## 2018-05-25 DIAGNOSIS — E785 Hyperlipidemia, unspecified: Secondary | ICD-10-CM | POA: Diagnosis not present

## 2018-05-25 DIAGNOSIS — E041 Nontoxic single thyroid nodule: Secondary | ICD-10-CM | POA: Diagnosis not present

## 2018-05-25 DIAGNOSIS — M5114 Intervertebral disc disorders with radiculopathy, thoracic region: Secondary | ICD-10-CM | POA: Diagnosis not present

## 2018-05-25 DIAGNOSIS — K589 Irritable bowel syndrome without diarrhea: Secondary | ICD-10-CM | POA: Diagnosis not present

## 2018-05-25 DIAGNOSIS — D126 Benign neoplasm of colon, unspecified: Secondary | ICD-10-CM | POA: Diagnosis not present

## 2018-05-25 DIAGNOSIS — E042 Nontoxic multinodular goiter: Secondary | ICD-10-CM | POA: Diagnosis not present

## 2018-05-25 DIAGNOSIS — K219 Gastro-esophageal reflux disease without esophagitis: Secondary | ICD-10-CM | POA: Diagnosis not present

## 2018-05-25 DIAGNOSIS — E669 Obesity, unspecified: Secondary | ICD-10-CM | POA: Diagnosis not present

## 2018-05-25 DIAGNOSIS — I77819 Aortic ectasia, unspecified site: Secondary | ICD-10-CM | POA: Diagnosis not present

## 2018-05-28 DIAGNOSIS — Z6839 Body mass index (BMI) 39.0-39.9, adult: Secondary | ICD-10-CM | POA: Diagnosis not present

## 2018-05-28 DIAGNOSIS — J069 Acute upper respiratory infection, unspecified: Secondary | ICD-10-CM | POA: Diagnosis not present

## 2018-06-17 DIAGNOSIS — E041 Nontoxic single thyroid nodule: Secondary | ICD-10-CM | POA: Diagnosis not present

## 2018-06-17 DIAGNOSIS — E7849 Other hyperlipidemia: Secondary | ICD-10-CM | POA: Diagnosis not present

## 2018-06-22 ENCOUNTER — Other Ambulatory Visit: Payer: Self-pay | Admitting: Dermatology

## 2018-06-22 DIAGNOSIS — D485 Neoplasm of uncertain behavior of skin: Secondary | ICD-10-CM | POA: Diagnosis not present

## 2018-06-22 DIAGNOSIS — B078 Other viral warts: Secondary | ICD-10-CM | POA: Diagnosis not present

## 2018-06-22 DIAGNOSIS — D229 Melanocytic nevi, unspecified: Secondary | ICD-10-CM | POA: Diagnosis not present

## 2018-06-22 DIAGNOSIS — D692 Other nonthrombocytopenic purpura: Secondary | ICD-10-CM | POA: Diagnosis not present

## 2018-07-25 ENCOUNTER — Other Ambulatory Visit: Payer: Self-pay

## 2018-07-25 ENCOUNTER — Encounter (HOSPITAL_COMMUNITY): Payer: Self-pay | Admitting: Emergency Medicine

## 2018-07-25 ENCOUNTER — Emergency Department (HOSPITAL_COMMUNITY)
Admission: EM | Admit: 2018-07-25 | Discharge: 2018-07-25 | Disposition: A | Discharge status: Home / Self Care | Payer: Medicare HMO | Attending: Emergency Medicine | Admitting: Emergency Medicine

## 2018-07-25 DIAGNOSIS — Y998 Other external cause status: Secondary | ICD-10-CM | POA: Insufficient documentation

## 2018-07-25 DIAGNOSIS — S6991XA Unspecified injury of right wrist, hand and finger(s), initial encounter: Secondary | ICD-10-CM

## 2018-07-25 DIAGNOSIS — I10 Essential (primary) hypertension: Secondary | ICD-10-CM | POA: Diagnosis not present

## 2018-07-25 DIAGNOSIS — Z23 Encounter for immunization: Secondary | ICD-10-CM | POA: Diagnosis not present

## 2018-07-25 DIAGNOSIS — W458XXA Other foreign body or object entering through skin, initial encounter: Secondary | ICD-10-CM | POA: Insufficient documentation

## 2018-07-25 DIAGNOSIS — Y9389 Activity, other specified: Secondary | ICD-10-CM | POA: Diagnosis not present

## 2018-07-25 DIAGNOSIS — Y9289 Other specified places as the place of occurrence of the external cause: Secondary | ICD-10-CM | POA: Diagnosis not present

## 2018-07-25 DIAGNOSIS — Z87891 Personal history of nicotine dependence: Secondary | ICD-10-CM | POA: Diagnosis not present

## 2018-07-25 DIAGNOSIS — S60551A Superficial foreign body of right hand, initial encounter: Secondary | ICD-10-CM | POA: Diagnosis not present

## 2018-07-25 DIAGNOSIS — E785 Hyperlipidemia, unspecified: Secondary | ICD-10-CM | POA: Diagnosis not present

## 2018-07-25 MED ORDER — TETANUS-DIPHTH-ACELL PERTUSSIS 5-2.5-18.5 LF-MCG/0.5 IM SUSP
0.5000 mL | Freq: Once | INTRAMUSCULAR | Status: AC
  Administered 2018-07-25: 0.5 mL via INTRAMUSCULAR
  Filled 2018-07-25: qty 0.5

## 2018-07-25 MED ORDER — LEVOFLOXACIN 750 MG PO TABS: 750.0000 mg | ORAL_TABLET | Freq: Every day | ORAL | 0 refills | Status: AC

## 2018-07-25 MED ORDER — LIDOCAINE-EPINEPHRINE (PF) 1 %-1:200000 IJ SOLN
10.0000 mL | Freq: Once | INTRAMUSCULAR | Status: AC
  Administered 2018-07-25: 10 mL via INTRADERMAL
  Filled 2018-07-25: qty 30

## 2018-07-25 NOTE — Discharge Instructions (Addendum)
1. Medications: Please take all of your antibiotics until finished!   You may develop abdominal discomfort or diarrhea from the antibiotic.  You may help offset this with probiotics which you can buy or get in yogurt. Do not eat  or take the probiotics until 2 hours after your antibiotic.   The oral antibiotics were prescribed to help avoid development of an infection in the hand given the nature of your injury.   2. Treatment: ice for swelling, keep wound clean with warm soap and water and keep bandage dry, do not submerge in water for 24 hours 3. Follow Up: Follow up with  Return to the emergency department sooner if any concerning signs or symptoms develop such as high fevers, redness, drainage of pus from the wound, or swelling.   WOUND CARE  Keep area clean and dry for 24 hours. Do not remove bandage, if applied.  After 24 hours, remove bandage and wash wound gently with mild soap and warm water. Reapply a new bandage after cleaning wound, if directed.   Continue daily cleansing with soap and water until stitches/staples are removed.  Do not apply any ointments or creams to the wound while stitches/staples are in place, as this may cause delayed healing. Return if you experience any of the following signs of infection: Swelling, redness, pus drainage, streaking, fever >101.0 F  Return if you experience excessive bleeding that does not stop after 15-20 minutes of constant, firm pressure.
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
## 2018-08-09 DIAGNOSIS — J302 Other seasonal allergic rhinitis: Secondary | ICD-10-CM | POA: Diagnosis not present

## 2018-08-09 DIAGNOSIS — J0100 Acute maxillary sinusitis, unspecified: Secondary | ICD-10-CM | POA: Diagnosis not present

## 2018-08-09 DIAGNOSIS — Z6839 Body mass index (BMI) 39.0-39.9, adult: Secondary | ICD-10-CM | POA: Diagnosis not present

## 2018-08-11 ENCOUNTER — Other Ambulatory Visit: Payer: Medicare HMO

## 2018-08-11 ENCOUNTER — Other Ambulatory Visit: Payer: Self-pay

## 2018-08-11 DIAGNOSIS — Z20822 Contact with and (suspected) exposure to covid-19: Secondary | ICD-10-CM

## 2018-08-11 DIAGNOSIS — R6889 Other general symptoms and signs: Secondary | ICD-10-CM | POA: Diagnosis not present

## 2018-08-15 LAB — NOVEL CORONAVIRUS, NAA: SARS-CoV-2, NAA: NOT DETECTED

## 2018-08-17 ENCOUNTER — Other Ambulatory Visit: Payer: Self-pay | Admitting: Family Medicine

## 2018-08-17 DIAGNOSIS — G8929 Other chronic pain: Secondary | ICD-10-CM

## 2018-08-17 DIAGNOSIS — M545 Low back pain, unspecified: Secondary | ICD-10-CM

## 2018-08-26 ENCOUNTER — Other Ambulatory Visit: Payer: Self-pay

## 2018-08-26 ENCOUNTER — Ambulatory Visit
Admission: RE | Admit: 2018-08-26 | Discharge: 2018-08-26 | Disposition: A | Discharge status: Home / Self Care | Payer: Medicare HMO | Source: Ambulatory Visit | Attending: Family Medicine | Admitting: Family Medicine

## 2018-08-26 DIAGNOSIS — M545 Low back pain, unspecified: Secondary | ICD-10-CM

## 2018-08-26 DIAGNOSIS — M25552 Pain in left hip: Secondary | ICD-10-CM | POA: Diagnosis not present

## 2018-08-26 DIAGNOSIS — G8929 Other chronic pain: Secondary | ICD-10-CM

## 2018-08-26 MED ORDER — IOPAMIDOL (ISOVUE-M 200) INJECTION 41%
1.0000 mL | Freq: Once | INTRAMUSCULAR | Status: AC
  Administered 2018-08-26: 11:00:00 1 mL via EPIDURAL

## 2018-08-26 MED ORDER — METHYLPREDNISOLONE ACETATE 40 MG/ML INJ SUSP (RADIOLOG
120.0000 mg | Freq: Once | INTRAMUSCULAR | Status: AC
  Administered 2018-08-26: 11:00:00 120 mg via EPIDURAL

## 2018-09-06 DIAGNOSIS — M5417 Radiculopathy, lumbosacral region: Secondary | ICD-10-CM | POA: Diagnosis not present

## 2018-09-06 DIAGNOSIS — Z1389 Encounter for screening for other disorder: Secondary | ICD-10-CM | POA: Diagnosis not present

## 2018-09-06 DIAGNOSIS — M5410 Radiculopathy, site unspecified: Secondary | ICD-10-CM | POA: Diagnosis not present

## 2018-09-06 DIAGNOSIS — Z6839 Body mass index (BMI) 39.0-39.9, adult: Secondary | ICD-10-CM | POA: Diagnosis not present

## 2018-09-06 DIAGNOSIS — I10 Essential (primary) hypertension: Secondary | ICD-10-CM | POA: Diagnosis not present

## 2018-09-23 DIAGNOSIS — M47816 Spondylosis without myelopathy or radiculopathy, lumbar region: Secondary | ICD-10-CM | POA: Diagnosis not present

## 2018-09-23 DIAGNOSIS — M5416 Radiculopathy, lumbar region: Secondary | ICD-10-CM | POA: Diagnosis not present

## 2018-09-23 DIAGNOSIS — M461 Sacroiliitis, not elsewhere classified: Secondary | ICD-10-CM | POA: Diagnosis not present

## 2018-09-23 DIAGNOSIS — M961 Postlaminectomy syndrome, not elsewhere classified: Secondary | ICD-10-CM | POA: Diagnosis not present

## 2018-09-27 ENCOUNTER — Other Ambulatory Visit (HOSPITAL_COMMUNITY): Payer: Self-pay | Admitting: Internal Medicine

## 2018-09-27 DIAGNOSIS — M545 Low back pain, unspecified: Secondary | ICD-10-CM

## 2018-09-30 ENCOUNTER — Other Ambulatory Visit: Payer: Self-pay | Admitting: Internal Medicine

## 2018-09-30 ENCOUNTER — Encounter (HOSPITAL_COMMUNITY): Payer: Self-pay

## 2018-09-30 ENCOUNTER — Other Ambulatory Visit: Payer: Self-pay

## 2018-09-30 ENCOUNTER — Ambulatory Visit (HOSPITAL_COMMUNITY)
Admission: RE | Admit: 2018-09-30 | Discharge: 2018-09-30 | Disposition: A | Discharge status: Home / Self Care | Payer: Medicare HMO | Source: Ambulatory Visit | Attending: Internal Medicine | Admitting: Internal Medicine

## 2018-09-30 DIAGNOSIS — M545 Low back pain, unspecified: Secondary | ICD-10-CM

## 2018-10-07 DIAGNOSIS — Z6838 Body mass index (BMI) 38.0-38.9, adult: Secondary | ICD-10-CM | POA: Diagnosis not present

## 2018-10-07 DIAGNOSIS — I10 Essential (primary) hypertension: Secondary | ICD-10-CM | POA: Diagnosis not present

## 2018-10-07 DIAGNOSIS — R69 Illness, unspecified: Secondary | ICD-10-CM | POA: Diagnosis not present

## 2018-10-07 DIAGNOSIS — K5732 Diverticulitis of large intestine without perforation or abscess without bleeding: Secondary | ICD-10-CM | POA: Diagnosis not present

## 2018-10-11 ENCOUNTER — Emergency Department (HOSPITAL_COMMUNITY)
Admission: EM | Admit: 2018-10-11 | Discharge: 2018-10-11 | Discharge status: Against Medical Advice | Payer: Medicare HMO

## 2018-10-11 ENCOUNTER — Other Ambulatory Visit: Payer: Self-pay

## 2018-10-14 ENCOUNTER — Other Ambulatory Visit: Payer: Self-pay

## 2018-10-14 ENCOUNTER — Encounter: Payer: Self-pay | Admitting: Physician Assistant

## 2018-10-14 ENCOUNTER — Other Ambulatory Visit (INDEPENDENT_AMBULATORY_CARE_PROVIDER_SITE_OTHER): Payer: Medicare HMO

## 2018-10-14 ENCOUNTER — Ambulatory Visit: Payer: Medicare HMO | Admitting: Physician Assistant

## 2018-10-14 VITALS — BP 100/60 | HR 82 | Temp 98.4°F | Ht 71.0 in | Wt 265.0 lb

## 2018-10-14 DIAGNOSIS — R197 Diarrhea, unspecified: Secondary | ICD-10-CM | POA: Diagnosis not present

## 2018-10-14 DIAGNOSIS — R1084 Generalized abdominal pain: Secondary | ICD-10-CM

## 2018-10-14 LAB — COMPREHENSIVE METABOLIC PANEL
ALT: 20 U/L (ref 0–53)
AST: 17 U/L (ref 0–37)
Albumin: 4 g/dL (ref 3.5–5.2)
Alkaline Phosphatase: 50 U/L (ref 39–117)
BUN: 10 mg/dL (ref 6–23)
CO2: 25 mEq/L (ref 19–32)
Calcium: 9.4 mg/dL (ref 8.4–10.5)
Chloride: 103 mEq/L (ref 96–112)
Creatinine, Ser: 1.21 mg/dL (ref 0.40–1.50)
GFR: 58.87 mL/min — ABNORMAL LOW (ref >60.00)
Glucose, Bld: 98 mg/dL (ref 70–99)
Potassium: 3.7 mEq/L (ref 3.5–5.1)
Sodium: 136 mEq/L (ref 135–145)
Total Bilirubin: 0.5 mg/dL (ref 0.2–1.2)
Total Protein: 7.3 g/dL (ref 6.0–8.3)

## 2018-10-14 LAB — CBC WITH DIFFERENTIAL/PLATELET
Basophils Absolute: 0.1 10*3/uL (ref 0.0–0.1)
Basophils Relative: 0.9 % (ref 0.0–3.0)
Eosinophils Absolute: 0.2 10*3/uL (ref 0.0–0.7)
Eosinophils Relative: 2 % (ref 0.0–5.0)
HCT: 40.9 % (ref 39.0–52.0)
Hemoglobin: 13.9 g/dL (ref 13.0–17.0)
Lymphocytes Relative: 21.9 % (ref 12.0–46.0)
Lymphs Abs: 2.4 10*3/uL (ref 0.7–4.0)
MCHC: 34 g/dL (ref 30.0–36.0)
MCV: 95.6 fl (ref 78.0–100.0)
Monocytes Absolute: 1.3 10*3/uL — ABNORMAL HIGH (ref 0.1–1.0)
Monocytes Relative: 12 % (ref 3.0–12.0)
Neutro Abs: 6.9 10*3/uL (ref 1.4–7.7)
Neutrophils Relative %: 63.2 % (ref 43.0–77.0)
Platelets: 318 10*3/uL (ref 150.0–400.0)
RBC: 4.28 Mil/uL (ref 4.22–5.81)
RDW: 13.6 % (ref 11.5–15.5)
WBC: 10.9 10*3/uL — ABNORMAL HIGH (ref 4.0–10.5)

## 2018-10-14 MED ORDER — PREDNISONE 50 MG PO TABS: ORAL_TABLET | ORAL | 0 refills | Status: DC

## 2018-10-14 MED ORDER — DICYCLOMINE HCL 10 MG PO CAPS: 10.0000 mg | ORAL_CAPSULE | Freq: Three times a day (TID) | ORAL | 1 refills | Status: DC

## 2018-10-14 NOTE — Progress Notes (Signed)
Chief Complaint: Diarrhea and abdominal pain  HPI:    Mr. Corpus is a 73 year old male with a past medical history as listed below, known to Dr. Fuller Plan, who was referred to me by Redmond School, MD for a complaint of abdominal pain and diarrhea.      02/20/2017 colonoscopy with internal hemorrhoids, moderate diverticulosis in the left colon and otherwise normal.  Repeat recommended in 5 years.    04/28/2018 patient canceled appointment due to [place] but was having stomach cramps and wanted hyoscyamine.  This was refilled for him.    08/26/2018 patient had L5 nerve root block and transforaminal epidural.    Today, the patient tells me that about a month ago he started with diarrhea which he describes as "gritty", loose, frequent stools with a lot of gas.  He has also lost 4 to 5 pounds over that time.  Along with this patient has started with abdominal pain which is worse over the past 2-1/2 weeks.  This is some better when he passes gas, but does not completely go away.  Tells me he is used to pain from IBS and thought that initially this was it but has not missed a dose of Dicyclomine, which he takes 4 times a day, and continues with this pain.  It feels "different" than his typical IBS.  He saw his PCP about a week ago who started him on Cipro and Flagyl for suspected diverticulitis.  He is on his seventh day of these medications and feels no better.  Associated symptoms include a decrease in energy and a general just "not feeling well".  Denies fever or chills.    Denies blood in his stool, nausea or vomiting.  Past Medical History:  Diagnosis Date  . Anxiety   . Arthritis    back  . Back pain, chronic   . Colon polyp   . Diverticulitis   . GERD (gastroesophageal reflux disease)   . Heart palpitations   . High cholesterol   . Hypertension   . Irritable bowel syndrome   . Osteoporosis   . Psoriasis   . Sleep apnea    c- pap    Past Surgical History:  Procedure Laterality Date  . BACK  SURGERY  2015  . CHOLECYSTECTOMY N/A 10/12/2015   Procedure: LAPAROSCOPIC CHOLECYSTECTOMY;  Surgeon: Aviva Signs, MD;  Location: [facility];  Service: General;  Laterality: N/A;  . COLONOSCOPY    . FOOT FRACTURE SURGERY     left foot  . KNEE ARTHROSCOPY     left  . TONSILLECTOMY    . UPPER GASTROINTESTINAL ENDOSCOPY      Current Outpatient Medications  Medication Sig Dispense Refill  . ALPRAZolam (XANAX) 0.5 MG tablet Take 0.25-0.5 mg by mouth at bedtime as needed for sleep.     Marland Kitchen aspirin EC 81 MG tablet Take 81 mg by mouth daily.    . Bacillus Coagulans-Inulin (PROBIOTIC FORMULA PO) Take 1 capsule by mouth every morning.    Marland Kitchen doxazosin (CARDURA) 4 MG tablet Take 4 mg by mouth at bedtime.      Marland Kitchen lisinopril (PRINIVIL,ZESTRIL) 10 MG tablet Take 10 mg by mouth every evening.     . metoprolol tartrate (LOPRESSOR) 25 MG tablet Take 1 tablet (25 mg total) by mouth 2 (two) times daily. 60 tablet 0  . oxyCODONE-acetaminophen (PERCOCET/ROXICET) 5-325 MG tablet Take 1-2 tablets by mouth every 4 (four) hours as needed for moderate pain or severe pain. 50 tablet 0  . pantoprazole (  PROTONIX) 40 MG tablet Take 1 tablet (40 mg total) by mouth 2 (two) times daily. (Patient taking differently: Take 40 mg by mouth every morning. ) 60 tablet 11  . pravastatin (PRAVACHOL) 20 MG tablet Take 20 mg by mouth every evening.     . Simethicone (GAS-X EXTRA STRENGTH) 125 MG CAPS Take 250 mg by mouth daily as needed (for flatulence).     No current facility-administered medications for this visit.     Allergies as of 10/14/2018 - Review Complete 07/25/2018  Allergen Reaction Noted  . Cephalexin Other (See Comments) 03/07/2008  . Contrast media [iodinated diagnostic agents] Hives and Rash 09/17/2011    Family History  Problem Relation Age of Onset  . Arrhythmia Mother        Atrial fib  . Arrhythmia Sister        Atrial fib  . Stroke Sister   . Breast cancer Sister   . Bladder Cancer Father           .  Colon cancer Neg Hx   . Stomach cancer Neg Hx   . Esophageal cancer Neg Hx   . Pancreatic cancer Neg Hx   . Prostate cancer Neg Hx   . Rectal cancer Neg Hx     Social History   Socioeconomic History  . Marital status: Married    Spouse name: Not on file  . Number of children: 2  . Years of education: Not on file  . Highest education level: Not on file  Occupational History    Comment: Retired  Scientific laboratory technician  . Financial resource strain: Not on file  . Food insecurity    Worry: Not on file    Inability: Not on file  . Transportation needs    Medical: Not on file    Non-medical: Not on file  Tobacco Use  . Smoking status: Former Smoker    Packs/day: 2.00    Types: Cigarettes    Start date: 05/22/1962    Quit date: 05/21/1973    Years since quitting: 45.4  . Smokeless tobacco: Never Used  . Tobacco comment: Quit 25 yrs now   Substance and Sexual Activity  . Alcohol use: Yes    Alcohol/week: 14.0 standard drinks    Types: 14 Shots of liquor per week    Comment: social  . Drug use: No  . Sexual activity: Yes    Partners: Female  Lifestyle  . Physical activity    Days per week: Not on file    Minutes per session: Not on file  . Stress: Not on file  Relationships  . Social Herbalist on phone: Not on file    Gets together: Not on file    Attends religious service: Not on file    Active member of club or organization: Not on file    Attends meetings of clubs or organizations: Not on file    Relationship status: Not on file  . Intimate partner violence    Fear of current or ex partner: Not on file    Emotionally abused: Not on file    Physically abused: Not on file    Forced sexual activity: Not on file  Other Topics Concern  . Not on file  Social History Narrative   Has dog named Rockydooal    Review of Systems:    Constitutional: No weight loss, fever or chills Cardiovascular: No chest pain Respiratory: No SOB  Gastrointestinal: See HPI and  otherwise  negative   Physical Exam:  Vital signs: BP 100/60   Pulse 82   Temp 98.4 F (36.9 C)   Ht 5\' 11"  (1.803 m)   Wt 265 lb (120.2 kg)   BMI 36.96 kg/m   Constitutional:   Pleasant overweight Caucasian male appears to be in NAD, Well developed, Well nourished, alert and cooperative Respiratory: Respirations even and unlabored. Lungs clear to auscultation bilaterally.   No wheezes, crackles, or rhonchi.  Cardiovascular: Normal S1, S2. No MRG. Regular rate and rhythm. No peripheral edema, cyanosis or pallor.  Gastrointestinal:  Soft, moderate distension, moderate generalized ttp, somewhat increased b/l lower quadrants with involuntary guarding, increased BS all four quadrants, No appreciable masses or hepatomegaly. Rectal:  Not performed.  Psychiatric: Demonstrates good judgement and reason without abnormal affect or behaviors.  No recent labs or imaging.  Assessment: 1.  Abdominal pain: Generalized abdominal pain, worse over the past 2.5 weeks, worse in lower abdomen at time of exam today, no change of Cipro and Flagyl x7 days for suspected diverticulitis; consider colitis versus other 2.  Diarrhea: Over the past month, multiple frequent stools per day, no different with Cipro and Flagyl for suspected diverticulitis; consider infectious cause versus other  Plan: 1.  This does not sound like patient's typical IBS pain.  Ordered CT abdomen pelvis with contrast for further evaluation.  Per pt chart has contrast allergy, will do 13 hour prep with Prednisone per protocol. This was ordered at [facility] in [place] per patient's request. 2.  Ordered CBC and CMP. 3.  Finish antibiotics as prescribed by PCP.  Continue Dicyclomine 4 times daily. 4.  Ordered stool studies to include GI path panel and O&P, also at [facility] per patient's request. 5.  Patient to follow clinic per recommendations after labs and testing above.  Ellouise Newer, PA-C [facility] 10/14/2018,  11:29 AM  Cc: Redmond School, MD

## 2018-10-14 NOTE — Patient Instructions (Addendum)
Finish antibiotics.   Continue Dicyclomine.  Your provider has requested that you go to the basement level for lab work before leaving today. Press "B" on the elevator. The lab is located at the first door on the left as you exit the elevator.  You have been scheduled for a CT scan of the abdomen and pelvis at [facility].  You are scheduled on 10/28/2018 at 10:00 am. You should arrive 15 minutes prior to your appointment time for registration. Please follow the written instructions below on the day of your exam:  WARNING: IF YOU ARE ALLERGIC TO IODINE/X-RAY DYE, PLEASE NOTIFY RADIOLOGY IMMEDIATELY AT [phone]! YOU WILL BE GIVEN A 13 HOUR PREMEDICATION PREP.  1) Do not eat or drink anything after 6:00 am (4 hours prior to your test) 2) You have been given 2 bottles of oral contrast to drink. The solution may taste better if refrigerated, but do NOT add ice or any other liquid to this solution. Shake well before drinking.    Drink 1 bottle of contrast @ 8:00 am (2 hours prior to your exam)  Drink 1 bottle of contrast @ 9:00 am (1 hour prior to your exam)  You may take any medications as prescribed with a small amount of water, if necessary. If you take any of the following medications: METFORMIN, GLUCOPHAGE, GLUCOVANCE, AVANDAMET, RIOMET, FORTAMET, [place] MET, JANUMET, GLUMETZA or METAGLIP, you MAY be asked to HOLD this medication 48 hours AFTER the exam.  The purpose of you drinking the oral contrast is to aid in the visualization of your intestinal tract. The contrast solution may cause some diarrhea. Depending on your individual set of symptoms, you may also receive an intravenous injection of x-ray contrast/dye. Plan on being at [facility] for 30 minutes or longer, depending on the type of exam you are having performed.  This test typically takes 30-45 minutes to complete.  If you have any questions regarding your exam or if you need to reschedule, you may call the CT department at  [phone] between the hours of 8:00 am and 5:00 pm, Monday-Friday.  ________________________________________________________________________  Since you are allergic to one or more components in IV contrast, we have sent a prescription for (3) 50 mg tablets of Prednisone to your pharmacy as a Pre-Medication Prep for your upcoming procedure requiring contrast.    Take (1) 50 mg tablet of prednisone 13 hours prior to your procedure at 9 pm on  10-27-2018.   Take (1) 50 mg tablet 7 hours prior to your procedure at 3:00 am on 10-28-2018 .    Take (1) 50 mg tablet 1 hour prior to your procedure at 9:00 am on 10-28-2018.    You also need to take 50 mg of Benadryl 1 hour prior to your procedure at 10-28-2018.  If you have 25 mg tablets of Benadryl, which can be purchased over the counter, you may take 2 tablets.  Otherwise we can send a prescription for (1) 50 mg tablet of Benadryl to your pharmacy.

## 2018-10-15 NOTE — Progress Notes (Signed)
Reviewed and agree with management plan.  Deloria Brassfield T. Wednesday Ericsson, MD FACG [facility]  

## 2018-10-17 ENCOUNTER — Other Ambulatory Visit: Payer: Self-pay

## 2018-10-17 ENCOUNTER — Emergency Department (HOSPITAL_COMMUNITY)
Admission: EM | Admit: 2018-10-17 | Discharge: 2018-10-17 | Disposition: A | Discharge status: Home / Self Care | Payer: Medicare HMO | Attending: Emergency Medicine | Admitting: Emergency Medicine

## 2018-10-17 ENCOUNTER — Encounter (HOSPITAL_COMMUNITY): Payer: Self-pay | Admitting: *Deleted

## 2018-10-17 ENCOUNTER — Emergency Department (HOSPITAL_COMMUNITY): Payer: Medicare HMO

## 2018-10-17 DIAGNOSIS — Z7982 Long term (current) use of aspirin: Secondary | ICD-10-CM | POA: Diagnosis not present

## 2018-10-17 DIAGNOSIS — R197 Diarrhea, unspecified: Secondary | ICD-10-CM | POA: Insufficient documentation

## 2018-10-17 DIAGNOSIS — Z79899 Other long term (current) drug therapy: Secondary | ICD-10-CM | POA: Insufficient documentation

## 2018-10-17 DIAGNOSIS — I10 Essential (primary) hypertension: Secondary | ICD-10-CM | POA: Diagnosis not present

## 2018-10-17 DIAGNOSIS — Z87891 Personal history of nicotine dependence: Secondary | ICD-10-CM | POA: Insufficient documentation

## 2018-10-17 DIAGNOSIS — R1033 Periumbilical pain: Secondary | ICD-10-CM | POA: Insufficient documentation

## 2018-10-17 DIAGNOSIS — K760 Fatty (change of) liver, not elsewhere classified: Secondary | ICD-10-CM | POA: Diagnosis not present

## 2018-10-17 DIAGNOSIS — R109 Unspecified abdominal pain: Secondary | ICD-10-CM

## 2018-10-17 DIAGNOSIS — K5730 Diverticulosis of large intestine without perforation or abscess without bleeding: Secondary | ICD-10-CM | POA: Diagnosis not present

## 2018-10-17 LAB — URINALYSIS, ROUTINE W REFLEX MICROSCOPIC
Bacteria, UA: NONE SEEN
Bilirubin Urine: NEGATIVE
Glucose, UA: NEGATIVE mg/dL
Hgb urine dipstick: NEGATIVE
Ketones, ur: 5 mg/dL — AB
Nitrite: NEGATIVE
Protein, ur: NEGATIVE mg/dL
Specific Gravity, Urine: 1.011 (ref 1.005–1.030)
pH: 5 (ref 5.0–8.0)

## 2018-10-17 LAB — CBC
HCT: 40.5 % (ref 39.0–52.0)
Hemoglobin: 13.6 g/dL (ref 13.0–17.0)
MCH: 32.2 pg (ref 26.0–34.0)
MCHC: 33.6 g/dL (ref 30.0–36.0)
MCV: 95.7 fL (ref 80.0–100.0)
Platelets: 318 10*3/uL (ref 150–400)
RBC: 4.23 MIL/uL (ref 4.22–5.81)
RDW: 13 % (ref 11.5–15.5)
WBC: 9.5 10*3/uL (ref 4.0–10.5)
nRBC: 0 % (ref 0.0–0.2)

## 2018-10-17 LAB — COMPREHENSIVE METABOLIC PANEL
ALT: 17 U/L (ref 0–44)
AST: 16 U/L (ref 15–41)
Albumin: 3.7 g/dL (ref 3.5–5.0)
Alkaline Phosphatase: 45 U/L (ref 38–126)
Anion gap: 12 (ref 5–15)
BUN: 8 mg/dL (ref 8–23)
CO2: 20 mmol/L — ABNORMAL LOW (ref 22–32)
Calcium: 9 mg/dL (ref 8.9–10.3)
Chloride: 106 mmol/L (ref 98–111)
Creatinine, Ser: 1.21 mg/dL (ref 0.61–1.24)
GFR calc Af Amer: >60 mL/min (ref >60)
GFR calc non Af Amer: 59 mL/min — ABNORMAL LOW (ref >60)
Glucose, Bld: 109 mg/dL — ABNORMAL HIGH (ref 70–99)
Potassium: 3.3 mmol/L — ABNORMAL LOW (ref 3.5–5.1)
Sodium: 138 mmol/L (ref 135–145)
Total Bilirubin: 0.6 mg/dL (ref 0.3–1.2)
Total Protein: 7.3 g/dL (ref 6.5–8.1)

## 2018-10-17 LAB — LIPASE, BLOOD: Lipase: 28 U/L (ref 11–51)

## 2018-10-17 MED ORDER — BARIUM SULFATE 2.1 % PO SUSP
ORAL | Status: AC
  Administered 2018-10-17: 06:00:00 1 mL
  Filled 2018-10-17: qty 2

## 2018-10-17 MED ORDER — HYDROCODONE-ACETAMINOPHEN 5-325 MG PO TABS
1.0000 | ORAL_TABLET | Freq: Once | ORAL | Status: AC
  Administered 2018-10-17: 09:00:00 1 via ORAL
  Filled 2018-10-17: qty 1

## 2018-10-17 MED ORDER — POTASSIUM CHLORIDE CRYS ER 20 MEQ PO TBCR
20.0000 meq | EXTENDED_RELEASE_TABLET | Freq: Once | ORAL | Status: AC
  Administered 2018-10-17: 20 meq via ORAL
  Filled 2018-10-17: qty 1

## 2018-10-17 MED ORDER — SODIUM CHLORIDE 0.9 % IV BOLUS
1000.0000 mL | Freq: Once | INTRAVENOUS | Status: AC
  Administered 2018-10-17: 1000 mL via INTRAVENOUS

## 2018-10-17 MED ORDER — FENTANYL CITRATE (PF) 100 MCG/2ML IJ SOLN
50.0000 ug | Freq: Once | INTRAMUSCULAR | Status: AC
  Administered 2018-10-17: 50 ug via INTRAVENOUS
  Filled 2018-10-17: qty 2

## 2018-10-17 MED ORDER — ONDANSETRON HCL 4 MG/2ML IJ SOLN
4.0000 mg | Freq: Once | INTRAMUSCULAR | Status: AC
  Administered 2018-10-17: 4 mg via INTRAVENOUS
  Filled 2018-10-17: qty 2

## 2018-10-17 NOTE — ED Provider Notes (Signed)
Pt received at change of shift with CT and UA pending. CT and UA reassuring. Potassium repleted PO. Pt has tol PO well while in the ED without N/V.  Abd benign, resps easy, VSS. Feels better and wants to go home now. Dx and testing, as well as incidental finding(s), d/w pt.  Questions answered.  Verb understanding, agreeable to d/c home with outpt f/u with his GI MD and Pulm MD.    BP 117/66   Pulse 67   Temp 98.1 F (36.7 C) (Oral)   Resp 18   Ht 5\' 11"  (1.803 m)   Wt 120.2 kg   SpO2 98%   BMI 36.96 kg/m    Results for orders placed or performed during the hospital encounter of 10/17/18  Lipase, blood  Result Value Ref Range   Lipase 28 11 - 51 U/L  Comprehensive metabolic panel  Result Value Ref Range   Sodium 138 135 - 145 mmol/L   Potassium 3.3 (L) 3.5 - 5.1 mmol/L   Chloride 106 98 - 111 mmol/L   CO2 20 (L) 22 - 32 mmol/L   Glucose, Bld 109 (H) 70 - 99 mg/dL   BUN 8 8 - 23 mg/dL   Creatinine, Ser 1.21 0.61 - 1.24 mg/dL   Calcium 9.0 8.9 - 10.3 mg/dL   Total Protein 7.3 6.5 - 8.1 g/dL   Albumin 3.7 3.5 - 5.0 g/dL   AST 16 15 - 41 U/L   ALT 17 0 - 44 U/L   Alkaline Phosphatase 45 38 - 126 U/L   Total Bilirubin 0.6 0.3 - 1.2 mg/dL   GFR calc non Af Amer 59 (L) >60 mL/min   GFR calc Af Amer >60 >60 mL/min   Anion gap 12 5 - 15  CBC  Result Value Ref Range   WBC 9.5 4.0 - 10.5 K/uL   RBC 4.23 4.22 - 5.81 MIL/uL   Hemoglobin 13.6 13.0 - 17.0 g/dL   HCT 40.5 39.0 - 52.0 %   MCV 95.7 80.0 - 100.0 fL   MCH 32.2 26.0 - 34.0 pg   MCHC 33.6 30.0 - 36.0 g/dL   RDW 13.0 11.5 - 15.5 %   Platelets 318 150 - 400 K/uL   nRBC 0.0 0.0 - 0.2 %  Urinalysis, Routine w reflex microscopic  Result Value Ref Range   Color, Urine YELLOW YELLOW   APPearance CLEAR CLEAR   Specific Gravity, Urine 1.011 1.005 - 1.030   pH 5.0 5.0 - 8.0   Glucose, UA NEGATIVE NEGATIVE mg/dL   Hgb urine dipstick NEGATIVE NEGATIVE   Bilirubin Urine NEGATIVE NEGATIVE   Ketones, ur 5 (A) NEGATIVE mg/dL    Protein, ur NEGATIVE NEGATIVE mg/dL   Nitrite NEGATIVE NEGATIVE   Leukocytes,Ua TRACE (A) NEGATIVE   RBC / HPF 0-5 0 - 5 RBC/hpf   WBC, UA 0-5 0 - 5 WBC/hpf   Bacteria, UA NONE SEEN NONE SEEN   Squamous Epithelial / LPF 0-5 0 - 5   Ct Abdomen Pelvis Wo Contrast Result Date: 10/17/2018 CLINICAL DATA:  73 year old male with history of abdominal pain, bloating and diarrhea. EXAM: CT ABDOMEN AND PELVIS WITHOUT CONTRAST TECHNIQUE: Multidetector CT imaging of the abdomen and pelvis was performed following the standard protocol without IV contrast. COMPARISON:  CT the abdomen and pelvis 02/22/2018. FINDINGS: Lower chest: Areas of ground-glass attenuation and septal thickening noted in the visualized lung bases bilaterally, increased compared to the prior study, concerning for progressive interstitial lung disease. Aortic atherosclerosis, as  well as calcified plaque in the right coronary artery. Hepatobiliary: Diffuse low attenuation throughout the hepatic parenchyma, indicative of hepatic steatosis. A few scattered colonic diverticulae are noted throughout the liver. No definite suspicious cystic or solid hepatic lesions are confidently identified on today's noncontrast CT examination. Status post cholecystectomy. Pancreas: No definite pancreatic mass or peripancreatic fluid collections or inflammatory changes noted on today's noncontrast CT examination. Spleen: Unremarkable. Adrenals/Urinary Tract: There are no abnormal calcifications within the collecting system of either kidney, along the course of either ureter, or within the lumen of the urinary bladder. No hydroureteronephrosis to suggest urinary tract obstruction at this time. Mild bilateral perinephric stranding (nonspecific). The unenhanced appearance of the kidneys is unremarkable bilaterally. Bilateral adrenal glands are normal in appearance. Stomach/Bowel: Normal appearance of the stomach. No pathologic dilatation of small bowel or colon. Numerous  colonic diverticulae are noted in the descending colon and sigmoid colon, without surrounding inflammatory changes to suggest an acute diverticulitis at this time. Normal appendix. Vascular/Lymphatic: Aortic atherosclerosis. No lymphadenopathy noted in the abdomen or pelvis. Reproductive: Prostate gland and seminal vesicles are unremarkable in appearance. Other: No significant volume of ascites.  No pneumoperitoneum. Musculoskeletal: Status post PLIF at L4-L5 with interbody graft at L4-L5 interspace. There are no aggressive appearing lytic or blastic lesions noted in the visualized portions of the skeleton. IMPRESSION: 1. No acute findings are noted in the abdomen or pelvis to account for the patient's symptoms. 2. The appearance of the lung bases is concerning for progressive interstitial lung disease. Outpatient referral to Pulmonology for further evaluation is recommended. 3. Hepatic steatosis. 4. Colonic diverticulosis without evidence of acute diverticulitis at this time. Electronically Signed   By: Vinnie Langton M.D.   On: 10/17/2018 08:28      Francine Graven, DO 10/17/18 1045

## 2018-10-17 NOTE — ED Notes (Signed)
Pt unable to provide urine sample at this time. Pt does report multiple stools since drinking the contrast.

## 2018-10-17 NOTE — ED Provider Notes (Signed)
[facility] EMERGENCY DEPARTMENT Provider Note   CSN: [id number] Arrival date & time: 10/17/18  0410   Time seen 4:38 AM  History   Chief Complaint Chief Complaint  Patient presents with  . Abdominal Pain    HPI Alexander Nunez is a 73 y.o. male.     HPI patient states he was seen by his primary care doctor 10 days ago when the abdominal pain he had been having for a few weeks got worse.  He was treated with Flagyl and Cipro for 10 days and he will finish his dosing today.  He also has been taking dicyclomine for abdominal pain.  He states he has a history of IBS but this pain seems to be different.  He has had it for about 3 years.  He also states he was seen on September 10 by his gastroenterologist and he is scheduled to have a CT scan in 2 weeks.  He was called later that day and was told his white blood cell count was high.  He states he is afraid to eat, his abdomen feels bloated, he has constant crampy pain.  He denies that eating makes the pain worse.  He states he has lost about 13 pounds since July.  He states he has grainy watery diarrhea about 2 times a day.  He denies fever, nausea, or vomiting.  He states nothing he is does makes the pain worse, nothing makes it feel better.  He denies feeling dizzy or lightheaded but states he feels weak.  He has had some decreased urinary output and his urine has been darker than usual.  He states tonight he came in because he was having ongoing pain.  PCP [employer], MD   Past Medical History:  Diagnosis Date  . Anxiety   . Arthritis    back  . Back pain, chronic   . Colon polyp   . Diverticulitis   . GERD (gastroesophageal reflux disease)   . Heart palpitations   . High cholesterol   . Hypertension   . Irritable bowel syndrome   . Osteoporosis   . Psoriasis   . Sleep apnea    c- pap    Patient Active Problem List   Diagnosis Date Noted  . Atypical chest pain 05/04/2014  . Sleep apnea 05/04/2014  . Abdominal  bloating 07/22/2013  . Spermatocele 05/02/2013  . Chronic prostatitis 01/31/2013  . Pain in testicle 01/31/2013  . Weakness of left leg 08/12/2012  . Generalized abdominal pain 09/02/2011  . Lumbar canal stenosis 05/28/2011  . Degenerative arthritis of lumbar spine 05/28/2011  . SPL (spondylolisthesis) 05/28/2011  . PERSONAL HX COLONIC POLYPS 04/19/2009  . IRRITABLE BOWEL SYNDROME 04/06/2008  . ABDOMINAL BLOATING 03/07/2008  . CHANGE IN BOWELS 03/07/2008  . ABDOMINAL PAIN-MULTIPLE SITES 03/07/2008  . Essential hypertension 03/06/2008  . GERD 03/06/2008  . GASTRITIS 03/06/2008  . CHRONIC RESPIRATORY DISEASE ARISE PERINTL PERIOD 03/06/2008  . HYPERLIPIDEMIA 02/06/2003    Past Surgical History:  Procedure Laterality Date  . BACK SURGERY  2015  . CHOLECYSTECTOMY N/A 10/12/2015   Procedure: LAPAROSCOPIC CHOLECYSTECTOMY;  Surgeon: Aviva Signs, MD;  Location: [facility];  Service: General;  Laterality: N/A;  . COLONOSCOPY    . FOOT FRACTURE SURGERY     left foot  . KNEE ARTHROSCOPY     left  . TONSILLECTOMY    . UPPER GASTROINTESTINAL ENDOSCOPY          Home Medications    Prior to Admission  medications   Medication Sig Start Date End Date Taking? Authorizing Provider  ALPRAZolam Duanne Moron) 0.5 MG tablet Take 0.25-0.5 mg by mouth at bedtime as needed for sleep.     [provider]  aspirin EC 81 MG tablet Take 81 mg by mouth daily.    [provider]  Bacillus Coagulans-Inulin (PROBIOTIC FORMULA PO) Take 1 capsule by mouth every morning.    [provider]  ciprofloxacin (CIPRO) 500 MG tablet Take 500 mg by mouth 2 (two) times daily.    [provider]  dicyclomine (BENTYL) 10 MG capsule Take 1 capsule (10 mg total) by mouth 4 (four) times daily -  before meals and at bedtime. 10/14/18   Levin Erp, PA  doxazosin (CARDURA) 4 MG tablet Take 4 mg by mouth at bedtime.      [provider]  lisinopril (PRINIVIL,ZESTRIL) 10 MG tablet  Take 10 mg by mouth every evening.     [provider]  metoprolol tartrate (LOPRESSOR) 25 MG tablet Take 1 tablet (25 mg total) by mouth 2 (two) times daily. 05/05/14   Black, Lezlie Octave, NP  metroNIDAZOLE (FLAGYL) 500 MG tablet Take 500 mg by mouth 4 (four) times daily.    [provider]  oxyCODONE-acetaminophen (PERCOCET/ROXICET) 5-325 MG tablet Take 1-2 tablets by mouth every 4 (four) hours as needed for moderate pain or severe pain. 10/12/15   Aviva Signs, MD  pantoprazole (PROTONIX) 40 MG tablet Take 1 tablet (40 mg total) by mouth 2 (two) times daily. Patient taking differently: Take 40 mg by mouth every morning.  10/07/11   Ladene Artist, MD  pravastatin (PRAVACHOL) 20 MG tablet Take 20 mg by mouth every evening.     [provider]  predniSONE (DELTASONE) 50 MG tablet Take as directed prior to CT scan 10/14/18   Levin Erp, PA  Simethicone (GAS-X EXTRA STRENGTH) 125 MG CAPS Take 250 mg by mouth daily as needed (for flatulence).    [provider]    Family History Family History  Problem Relation Age of Onset  . Arrhythmia Mother        Atrial fib  . Arrhythmia Sister        Atrial fib  . Stroke Sister   . Breast cancer Sister   . Bladder Cancer Father           . Colon cancer Neg Hx   . Stomach cancer Neg Hx   . Esophageal cancer Neg Hx   . Pancreatic cancer Neg Hx   . Prostate cancer Neg Hx   . Rectal cancer Neg Hx     Social History Social History   Tobacco Use  . Smoking status: Former Smoker    Packs/day: 2.00    Types: Cigarettes    Start date: 05/22/1962    Quit date: 05/21/1973    Years since quitting: 45.4  . Smokeless tobacco: Never Used  . Tobacco comment: Quit 25 yrs now   Substance Use Topics  . Alcohol use: Yes    Alcohol/week: 14.0 standard drinks    Types: 14 Shots of liquor per week    Comment: social  . Drug use: No     Allergies   Cephalexin and Contrast media [iodinated diagnostic agents]    Review of Systems Review of Systems  All other systems reviewed and are negative.    Physical Exam Updated Vital Signs BP 129/78   Pulse 93   Temp 98.1 F (36.7 C) (Oral)  Resp 20   Ht 5\' 11"  (1.803 m)   Wt 120.2 kg   SpO2 96%   BMI 36.96 kg/m   Physical Exam Vitals signs and nursing note reviewed.  Constitutional:      General: He is not in acute distress.    Appearance: Normal appearance. He is well-developed. He is not ill-appearing or toxic-appearing.  HENT:     Head: Normocephalic and atraumatic.     Right Ear: External ear normal.     Left Ear: External ear normal.     Nose: Nose normal. No mucosal edema or rhinorrhea.     Mouth/Throat:     Dentition: No dental abscesses.     Pharynx: No uvula swelling.  Eyes:     Conjunctiva/sclera: Conjunctivae normal.     Pupils: Pupils are equal, round, and reactive to light.  Neck:     Musculoskeletal: Full passive range of motion without pain, normal range of motion and neck supple.  Cardiovascular:     Rate and Rhythm: Normal rate and regular rhythm.     Heart sounds: Normal heart sounds. No murmur. No friction rub. No gallop.   Pulmonary:     Effort: Pulmonary effort is normal. No respiratory distress.     Breath sounds: Normal breath sounds. No wheezing, rhonchi or rales.  Chest:     Chest wall: No tenderness or crepitus.  Abdominal:     General: Bowel sounds are normal. There is no distension.     Palpations: Abdomen is soft.     Tenderness: There is no abdominal tenderness. There is no guarding or rebound.       Comments: Patient has a lot of pain to palpation around his umbilicus mainly superior.  He does not have pain in the right lower or left lower abdomen.  Musculoskeletal: Normal range of motion.        General: No tenderness.     Comments: Moves all extremities well.   Skin:    General: Skin is warm and dry.     Coloration: Skin is not pale.     Findings: No erythema or rash.  Neurological:      Mental Status: He is alert and oriented to person, place, and time.     Cranial Nerves: No cranial nerve deficit.  Psychiatric:        Mood and Affect: Mood is not anxious.        Speech: Speech normal.        Behavior: Behavior normal.      ED Treatments / Results  Labs (all labs ordered are listed, but only abnormal results are displayed) Labs Reviewed  COMPREHENSIVE METABOLIC PANEL - Abnormal; Notable for the following components:      Result Value   Potassium 3.3 (*)    CO2 20 (*)    Glucose, Bld 109 (*)    GFR calc non Af Amer 59 (*)    All other components within normal limits  LIPASE, BLOOD  CBC  URINALYSIS, ROUTINE W REFLEX MICROSCOPIC    EKG None  Radiology No results found.  Procedures Procedures (including critical care time)  Medications Ordered in ED Medications  sodium chloride 0.9 % bolus 1,000 mL (1,000 mLs Intravenous New Bag/Given 10/17/18 0512)  fentaNYL (SUBLIMAZE) injection 50 mcg (50 mcg Intravenous Given 10/17/18 0516)  ondansetron (ZOFRAN) injection 4 mg (4 mg Intravenous Given 10/17/18 0514)  Barium Sulfate 2.1 % SUSP (1 mL  Given 10/17/18 0530)     Initial Impression /  Assessment and Plan / ED Course  I have reviewed the triage vital signs and the nursing notes.  Pertinent labs & imaging results that were available during my care of the patient were reviewed by me and considered in my medical decision making (see chart for details).       Patient was given IV fluids, IV nausea and pain medication.  CT scan was ordered.  He states he has an allergy to IV dye so CT was done with just oral contrast.  Recheck at 6:30 AM it is time for patient second bottle of oral contrast.  We discussed his laboratory tests which are basically back down to normal now.  Patient was informed the day doctor would let him know what his CT showed.  Patient was left with Dr. Thurnell Garbe at change of shift at 7 to get results of his CT scan.  Final Clinical  Impressions(s) / ED Diagnoses   Final diagnoses:  Periumbilical abdominal pain  Diarrhea, unspecified type    Disposition pending  Rolland Porter, MD, Barbette Or, MD 10/17/18 [phone]

## 2018-10-17 NOTE — Discharge Instructions (Addendum)
Your CT scan showed an incidental finding(s):  "The appearance of the lung bases is concerning for progressive interstitial lung disease. Outpatient referral to Pulmonology for further evaluation is recommended."  Take your usual prescriptions as previously directed.  Increase your fluid intake (ie:  Gatoraide) for the next few days.  Eat a bland diet and advance to your regular diet slowly as you can tolerate it.   Avoid full strength juices, as well as milk and milk products until your diarrhea has resolved.   Call your regular medical doctor and your GI doctor Monday to schedule a follow up appointment this week.  Call the Pulmonologist on Monday to schedule a follow up appointment for your CT finding above. Return to the Emergency Department immediately sooner if worsening.

## 2018-10-17 NOTE — ED Triage Notes (Signed)
Pt c/o abd pain with diarrhea, was seen by pcp and gastro a few days ago, finished flagyl and Cipro for ? Diverticulitis with no improvement,

## 2018-10-17 NOTE — ED Notes (Signed)
ED Provider at bedside. 

## 2018-10-18 ENCOUNTER — Telehealth: Payer: Self-pay | Admitting: Physician Assistant

## 2018-10-18 NOTE — Telephone Encounter (Signed)
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

## 2018-10-19 ENCOUNTER — Other Ambulatory Visit: Payer: Medicare HMO

## 2018-10-19 DIAGNOSIS — R1084 Generalized abdominal pain: Secondary | ICD-10-CM

## 2018-10-19 DIAGNOSIS — R197 Diarrhea, unspecified: Secondary | ICD-10-CM | POA: Diagnosis not present

## 2018-10-19 LAB — URINE CULTURE: Culture: NO GROWTH

## 2018-10-19 NOTE — Telephone Encounter (Signed)
Called patient back, he says he went to [facility] ED on 10/17/18 with abdominal pain and they did a CT (but without contrast). He has one scheduled by Ellouise Newer PA on 10/28/18 with contrast. He wanted to know if he should cancel ours. Please advise. Said his gas and abdominal pain is a little better today, but he still has diarrhea. I suggested he take Immodium OTC per package instructions until his stool is soft-formed. He did submit his stool sample today.

## 2018-10-19 NOTE — Telephone Encounter (Signed)
He does not need repeat CT. Thanks-JLL

## 2018-10-20 LAB — GASTROINTESTINAL PATHOGEN PANEL PCR
C. difficile Tox A/B, PCR: NOT DETECTED
Campylobacter, PCR: NOT DETECTED
Cryptosporidium, PCR: NOT DETECTED
E coli (ETEC) LT/ST PCR: NOT DETECTED
E coli (STEC) stx1/stx2, PCR: NOT DETECTED
E coli 0157, PCR: NOT DETECTED
Giardia lamblia, PCR: NOT DETECTED
Norovirus, PCR: NOT DETECTED
Rotavirus A, PCR: NOT DETECTED
Salmonella, PCR: NOT DETECTED
Shigella, PCR: NOT DETECTED

## 2018-10-20 NOTE — Telephone Encounter (Signed)
Called Radiology and cancelled patient's CT scheduled for 10/28/18 (per Ellouise Newer PA) since patient went into [place] over the weekend and had a CT done. Patient called and notified.

## 2018-10-21 ENCOUNTER — Telehealth: Payer: Self-pay | Admitting: Physician Assistant

## 2018-10-21 LAB — OVA AND PARASITE EXAMINATION
CONCENTRATE RESULT:: NONE SEEN
MICRO NUMBER:: 882869
SPECIMEN QUALITY:: ADEQUATE
TRICHROME RESULT:: NONE SEEN

## 2018-10-21 NOTE — Telephone Encounter (Signed)
Pt returned your call, pls call him again. °

## 2018-10-21 NOTE — Telephone Encounter (Signed)
See result note phone call 

## 2018-10-27 DIAGNOSIS — K219 Gastro-esophageal reflux disease without esophagitis: Secondary | ICD-10-CM | POA: Diagnosis not present

## 2018-10-27 DIAGNOSIS — E669 Obesity, unspecified: Secondary | ICD-10-CM | POA: Diagnosis not present

## 2018-10-27 DIAGNOSIS — Z6838 Body mass index (BMI) 38.0-38.9, adult: Secondary | ICD-10-CM | POA: Diagnosis not present

## 2018-10-27 DIAGNOSIS — I10 Essential (primary) hypertension: Secondary | ICD-10-CM | POA: Diagnosis not present

## 2018-10-27 DIAGNOSIS — J849 Interstitial pulmonary disease, unspecified: Secondary | ICD-10-CM | POA: Diagnosis not present

## 2018-10-28 ENCOUNTER — Ambulatory Visit (HOSPITAL_COMMUNITY): Payer: Medicare HMO

## 2018-10-30 ENCOUNTER — Other Ambulatory Visit: Payer: Medicare HMO

## 2018-11-05 ENCOUNTER — Other Ambulatory Visit (HOSPITAL_COMMUNITY): Payer: Self-pay | Admitting: Internal Medicine

## 2018-11-05 ENCOUNTER — Ambulatory Visit (HOSPITAL_COMMUNITY)
Admission: RE | Admit: 2018-11-05 | Discharge: 2018-11-05 | Disposition: A | Discharge status: Home / Self Care | Payer: Medicare HMO | Source: Ambulatory Visit | Attending: Internal Medicine | Admitting: Internal Medicine

## 2018-11-05 ENCOUNTER — Other Ambulatory Visit: Payer: Self-pay

## 2018-11-05 DIAGNOSIS — R9389 Abnormal findings on diagnostic imaging of other specified body structures: Secondary | ICD-10-CM

## 2018-11-05 DIAGNOSIS — R918 Other nonspecific abnormal finding of lung field: Secondary | ICD-10-CM | POA: Diagnosis not present

## 2018-11-15 DIAGNOSIS — K760 Fatty (change of) liver, not elsewhere classified: Secondary | ICD-10-CM | POA: Diagnosis not present

## 2018-11-15 DIAGNOSIS — G4733 Obstructive sleep apnea (adult) (pediatric): Secondary | ICD-10-CM | POA: Diagnosis not present

## 2018-11-15 DIAGNOSIS — M545 Low back pain: Secondary | ICD-10-CM | POA: Diagnosis not present

## 2018-11-15 DIAGNOSIS — E041 Nontoxic single thyroid nodule: Secondary | ICD-10-CM | POA: Diagnosis not present

## 2018-11-15 DIAGNOSIS — K219 Gastro-esophageal reflux disease without esophagitis: Secondary | ICD-10-CM | POA: Diagnosis not present

## 2018-11-15 DIAGNOSIS — R918 Other nonspecific abnormal finding of lung field: Secondary | ICD-10-CM | POA: Diagnosis not present

## 2018-11-15 DIAGNOSIS — E7849 Other hyperlipidemia: Secondary | ICD-10-CM | POA: Diagnosis not present

## 2018-11-15 DIAGNOSIS — E669 Obesity, unspecified: Secondary | ICD-10-CM | POA: Diagnosis not present

## 2018-11-16 DIAGNOSIS — Z0001 Encounter for general adult medical examination with abnormal findings: Secondary | ICD-10-CM | POA: Diagnosis not present

## 2018-11-16 DIAGNOSIS — Z23 Encounter for immunization: Secondary | ICD-10-CM | POA: Diagnosis not present

## 2018-11-16 DIAGNOSIS — Z6838 Body mass index (BMI) 38.0-38.9, adult: Secondary | ICD-10-CM | POA: Diagnosis not present

## 2018-11-16 DIAGNOSIS — Z1389 Encounter for screening for other disorder: Secondary | ICD-10-CM | POA: Diagnosis not present

## 2018-11-16 DIAGNOSIS — G894 Chronic pain syndrome: Secondary | ICD-10-CM | POA: Diagnosis not present

## 2018-11-16 DIAGNOSIS — K219 Gastro-esophageal reflux disease without esophagitis: Secondary | ICD-10-CM | POA: Diagnosis not present

## 2018-11-16 DIAGNOSIS — I10 Essential (primary) hypertension: Secondary | ICD-10-CM | POA: Diagnosis not present

## 2018-11-17 ENCOUNTER — Other Ambulatory Visit: Payer: Self-pay | Admitting: Physician Assistant

## 2018-11-22 DIAGNOSIS — I10 Essential (primary) hypertension: Secondary | ICD-10-CM | POA: Diagnosis not present

## 2018-11-22 DIAGNOSIS — J849 Interstitial pulmonary disease, unspecified: Secondary | ICD-10-CM | POA: Diagnosis not present

## 2018-11-22 DIAGNOSIS — K2100 Gastro-esophageal reflux disease with esophagitis, without bleeding: Secondary | ICD-10-CM | POA: Diagnosis not present

## 2018-11-26 DIAGNOSIS — Z0000 Encounter for general adult medical examination without abnormal findings: Secondary | ICD-10-CM | POA: Diagnosis not present

## 2018-12-06 DIAGNOSIS — N4341 Spermatocele of epididymis, single: Secondary | ICD-10-CM | POA: Diagnosis not present

## 2018-12-06 DIAGNOSIS — N411 Chronic prostatitis: Secondary | ICD-10-CM | POA: Diagnosis not present

## 2018-12-08 ENCOUNTER — Other Ambulatory Visit (HOSPITAL_COMMUNITY): Payer: Self-pay | Admitting: Pulmonary Disease

## 2018-12-08 ENCOUNTER — Other Ambulatory Visit: Payer: Self-pay | Admitting: Pulmonary Disease

## 2018-12-08 DIAGNOSIS — J849 Interstitial pulmonary disease, unspecified: Secondary | ICD-10-CM

## 2018-12-11 ENCOUNTER — Other Ambulatory Visit: Payer: Self-pay | Admitting: Physician Assistant

## 2018-12-14 DIAGNOSIS — D229 Melanocytic nevi, unspecified: Secondary | ICD-10-CM | POA: Diagnosis not present

## 2018-12-14 DIAGNOSIS — L409 Psoriasis, unspecified: Secondary | ICD-10-CM | POA: Diagnosis not present

## 2018-12-21 ENCOUNTER — Other Ambulatory Visit: Payer: Self-pay

## 2018-12-21 ENCOUNTER — Ambulatory Visit (HOSPITAL_COMMUNITY)
Admission: RE | Admit: 2018-12-21 | Discharge: 2018-12-21 | Disposition: A | Discharge status: Home / Self Care | Payer: Medicare HMO | Source: Ambulatory Visit | Attending: Pulmonary Disease | Admitting: Pulmonary Disease

## 2018-12-21 DIAGNOSIS — J439 Emphysema, unspecified: Secondary | ICD-10-CM | POA: Diagnosis not present

## 2018-12-21 DIAGNOSIS — J849 Interstitial pulmonary disease, unspecified: Secondary | ICD-10-CM | POA: Diagnosis not present

## 2018-12-22 DIAGNOSIS — M5416 Radiculopathy, lumbar region: Secondary | ICD-10-CM | POA: Diagnosis not present

## 2018-12-27 ENCOUNTER — Other Ambulatory Visit (HOSPITAL_COMMUNITY): Payer: Self-pay | Admitting: Respiratory Therapy

## 2018-12-27 DIAGNOSIS — R0602 Shortness of breath: Secondary | ICD-10-CM

## 2019-01-03 ENCOUNTER — Other Ambulatory Visit (HOSPITAL_COMMUNITY)
Admission: RE | Admit: 2019-01-03 | Discharge: 2019-01-03 | Disposition: A | Discharge status: Home / Self Care | Payer: Medicare HMO | Source: Ambulatory Visit | Attending: Pulmonary Disease | Admitting: Pulmonary Disease

## 2019-01-03 ENCOUNTER — Other Ambulatory Visit: Payer: Self-pay

## 2019-01-03 DIAGNOSIS — Z20828 Contact with and (suspected) exposure to other viral communicable diseases: Secondary | ICD-10-CM | POA: Insufficient documentation

## 2019-01-03 DIAGNOSIS — Z01812 Encounter for preprocedural laboratory examination: Secondary | ICD-10-CM | POA: Insufficient documentation

## 2019-01-03 LAB — SARS CORONAVIRUS 2 (TAT 6-24 HRS): SARS Coronavirus 2: NEGATIVE

## 2019-01-04 ENCOUNTER — Other Ambulatory Visit: Payer: Self-pay | Admitting: Physician Assistant

## 2019-01-06 ENCOUNTER — Ambulatory Visit (HOSPITAL_COMMUNITY)
Admission: RE | Admit: 2019-01-06 | Discharge: 2019-01-06 | Disposition: A | Discharge status: Home / Self Care | Payer: Medicare HMO | Source: Ambulatory Visit | Attending: Pulmonary Disease | Admitting: Pulmonary Disease

## 2019-01-06 ENCOUNTER — Other Ambulatory Visit: Payer: Self-pay

## 2019-01-06 DIAGNOSIS — R0602 Shortness of breath: Secondary | ICD-10-CM | POA: Diagnosis present

## 2019-01-06 LAB — PULMONARY FUNCTION TEST
DL/VA % pred: 125 %
DL/VA: 5.02 ml/min/mmHg/L
DLCO unc % pred: 85 %
DLCO unc: 22.56 ml/min/mmHg
FEF 25-75 Post: 3.73 L/sec
FEF 25-75 Pre: 3.26 L/sec
FEF2575-%Change-Post: 14 %
FEF2575-%Pred-Post: 151 %
FEF2575-%Pred-Pre: 132 %
FEV1-%Change-Post: 1 %
FEV1-%Pred-Post: 78 %
FEV1-%Pred-Pre: 77 %
FEV1-Post: 2.61 L
FEV1-Pre: 2.57 L
FEV1FVC-%Change-Post: 2 %
FEV1FVC-%Pred-Pre: 115 %
FEV6-%Change-Post: -1 %
FEV6-%Pred-Post: 69 %
FEV6-%Pred-Pre: 70 %
FEV6-Post: 2.96 L
FEV6-Pre: 2.99 L
FEV6FVC-%Change-Post: 0 %
FEV6FVC-%Pred-Post: 104 %
FEV6FVC-%Pred-Pre: 104 %
FVC-%Change-Post: 0 %
FVC-%Pred-Post: 66 %
FVC-%Pred-Pre: 67 %
FVC-Post: 3 L
FVC-Pre: 3.03 L
Post FEV1/FVC ratio: 87 %
Post FEV6/FVC ratio: 99 %
Pre FEV1/FVC ratio: 85 %
Pre FEV6/FVC Ratio: 99 %
RV % pred: 47 %
RV: 1.22 L
TLC % pred: 59 %
TLC: 4.33 L

## 2019-01-06 MED ORDER — ALBUTEROL SULFATE (2.5 MG/3ML) 0.083% IN NEBU
2.5000 mg | INHALATION_SOLUTION | Freq: Once | RESPIRATORY_TRACT | Status: AC
  Administered 2019-01-06: 2.5 mg via RESPIRATORY_TRACT

## 2019-01-18 DIAGNOSIS — I10 Essential (primary) hypertension: Secondary | ICD-10-CM | POA: Diagnosis not present

## 2019-01-18 DIAGNOSIS — J849 Interstitial pulmonary disease, unspecified: Secondary | ICD-10-CM | POA: Diagnosis not present

## 2019-02-10 ENCOUNTER — Other Ambulatory Visit: Payer: Self-pay

## 2019-02-10 ENCOUNTER — Ambulatory Visit: Payer: Medicare HMO | Admitting: Pulmonary Disease

## 2019-02-10 ENCOUNTER — Encounter: Payer: Self-pay | Admitting: Pulmonary Disease

## 2019-02-10 VITALS — BP 118/72 | HR 62 | Temp 97.7°F | Ht 71.0 in | Wt 273.6 lb

## 2019-02-10 DIAGNOSIS — J84112 Idiopathic pulmonary fibrosis: Secondary | ICD-10-CM | POA: Diagnosis not present

## 2019-02-10 NOTE — Patient Instructions (Signed)
We will get some blood test today to further evaluate the cause of your interstitial lung disease and scarring in the lung We will give you a questionnaire to evaluate exposures  Follow-up in 4 weeks.

## 2019-02-10 NOTE — Addendum Note (Signed)
Addended by: Suzzanne Cloud E on: 02/10/2019 10:16 AM   Modules accepted: Orders

## 2019-02-10 NOTE — Progress Notes (Addendum)
Alexander Nunez    ZZ:[id number]    [birth year]  Primary Care Physician:Fusco, Purcell Nails, MD  Referring Physician: Redmond School, MD [address] [place],  [place] [address]  Chief complaint: Consult for ILD  HPI: 74 year old with history of hypertension, hyperlipidemia, sleep apnea, psoriasis Had an incidental finding of interstitial lung disease on CT abdomen which was done for diverticulitis He has seen Dr. Velvet Bathe at [facility] who did a high resolution CT with findings of alternate diagnosis, possible HP.  He had HP panel on 12/28/2018 which is negative. History notable for psoriasis with skin involvement.  He was on methotrexate for a couple of years and stopped as his skin rash improved.  Last dose of methotrexate was several years ago.  No respiratory complaints at present.  Denies any dyspnea, cough, sputum production  Pets: Has a dog.  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  No mold, hot tub, Jacuzzi, no down pillows or comforters.  He uses talcum powder every day.  May have had Freon leak from his HVAC system Likes to play golf.  No significant hobbies. Smoking history: 30-pack-year smoker.  Quit in 1980 Travel history: No significant travel history Relevant family history: No significant family history of lung  Outpatient Encounter Medications as of 02/10/2019  Medication Sig  . ALPRAZolam (XANAX) 0.5 MG tablet Take 0.25-0.5 mg by mouth at bedtime as needed for sleep.   Marland Kitchen aspirin EC 81 MG tablet Take 81 mg by mouth daily.  . Bacillus Coagulans-Inulin (PROBIOTIC FORMULA PO) Take 1 capsule by mouth every morning.  Marland Kitchen doxazosin (CARDURA) 4 MG tablet Take 4 mg by mouth at bedtime.    Marland Kitchen lisinopril (PRINIVIL,ZESTRIL) 10 MG tablet Take 10 mg by mouth every evening.   . metoprolol tartrate (LOPRESSOR) 25 MG tablet Take 1 tablet (25 mg total) by mouth 2 (two) times daily.    Marland Kitchen oxyCODONE-acetaminophen (PERCOCET/ROXICET) 5-325 MG tablet Take 1-2 tablets by mouth every 4 (four) hours as needed for moderate pain or severe pain.  . pantoprazole (PROTONIX) 40 MG tablet Take 1 tablet (40 mg total) by mouth 2 (two) times daily. (Patient taking differently: Take 40 mg by mouth daily. )  . pravastatin (PRAVACHOL) 20 MG tablet Take 20 mg by mouth every evening.   . Simethicone (GAS-X EXTRA STRENGTH) 125 MG CAPS Take 250 mg by mouth daily as needed (for flatulence).  . [DISCONTINUED] ciprofloxacin (CIPRO) 500 MG tablet Take 500 mg by mouth 2 (two) times daily.  . [DISCONTINUED] dicyclomine (BENTYL) 10 MG capsule TAKE 1 CAPSULE BY MOUTH 4 TIMES DAILY - BEFORE MEALS AND AT BEDTIME.  . [DISCONTINUED] metroNIDAZOLE (FLAGYL) 500 MG tablet Take 500 mg by mouth 4 (four) times daily.  . [DISCONTINUED] predniSONE (DELTASONE) 50 MG tablet Take as directed prior to CT scan   No facility-administered encounter medications on file as of 02/10/2019.    Allergies as of 02/10/2019 - Review Complete 02/10/2019  Allergen Reaction Noted  . Cephalexin Other (See Comments) 03/07/2008  . Contrast media [iodinated diagnostic agents] Hives and Rash 09/17/2011    Past Medical History:  Diagnosis Date  . Anxiety   . Arthritis    back  . Back pain, chronic   . Colon polyp   . Diverticulitis   . GERD (gastroesophageal reflux disease)   . Heart palpitations   . High cholesterol   . Hypertension   .  Irritable bowel syndrome   . Osteoporosis   . Psoriasis   . Sleep apnea    c- pap    Past Surgical History:  Procedure Laterality Date  . BACK SURGERY  2015  . CHOLECYSTECTOMY N/A 10/12/2015   Procedure: LAPAROSCOPIC CHOLECYSTECTOMY;  Surgeon: Aviva Signs, MD;  Location: [facility];  Service: General;  Laterality: N/A;  . COLONOSCOPY    . FOOT FRACTURE SURGERY     left foot  . KNEE ARTHROSCOPY     left  . TONSILLECTOMY    . UPPER GASTROINTESTINAL ENDOSCOPY      Family History  Problem  Relation Age of Onset  . Arrhythmia Mother        Atrial fib  . Arrhythmia Sister        Atrial fib  . Stroke Sister   . Breast cancer Sister   . Bladder Cancer Father           . Colon cancer Neg Hx   . Stomach cancer Neg Hx   . Esophageal cancer Neg Hx   . Pancreatic cancer Neg Hx   . Prostate cancer Neg Hx   . Rectal cancer Neg Hx     Social History   Socioeconomic History  . Marital status: Married    Spouse name: Not on file  . Number of children: 2  . Years of education: Not on file  . Highest education level: Not on file  Occupational History  . Occupation: retired    Comment: Retired  Tobacco Use  . Smoking status: Former Smoker    Packs/day: 2.00    Types: Cigarettes    Start date: 05/22/1962    Quit date: 05/21/1973    Years since quitting: 45.7  . Smokeless tobacco: Never Used  . Tobacco comment: Quit 25 yrs now   Substance and Sexual Activity  . Alcohol use: Yes    Alcohol/week: 14.0 standard drinks    Types: 14 Shots of liquor per week    Comment: social  . Drug use: No  . Sexual activity: Yes    Partners: Female  Other Topics Concern  . Not on file  Social History Narrative   Has dog named Rockydooal   Social Determinants of Health   Financial Resource Strain:   . Difficulty of Paying Living Expenses: Not on file  Food Insecurity:   . Worried About Charity fundraiser in the Last Year: Not on file  . Ran Out of Food in the Last Year: Not on file  Transportation Needs:   . Lack of Transportation (Medical): Not on file  . Lack of Transportation (Non-Medical): Not on file  Physical Activity:   . Days of Exercise per Week: Not on file  . Minutes of Exercise per Session: Not on file  Stress:   . Feeling of Stress : Not on file  Social Connections:   . Frequency of Communication with Friends and Family: Not on file  . Frequency of Social Gatherings with Friends and Family: Not on file  . Attends Religious Services: Not on file  . Active  Member of Clubs or Organizations: Not on file  . Attends Archivist Meetings: Not on file  . Marital Status: Not on file  Intimate Partner Violence:   . Fear of Current or Ex-Partner: Not on file  . Emotionally Abused: Not on file  . Physically Abused: Not on file  . Sexually Abused: Not on file    Review of systems: Review  of Systems  Constitutional: Negative for fever and chills.  HENT: Negative.   Eyes: Negative for blurred vision.  Respiratory: as per HPI  Cardiovascular: Negative for chest pain and palpitations.  Gastrointestinal: Negative for vomiting, diarrhea, blood per rectum. Genitourinary: Negative for dysuria, urgency, frequency and hematuria.  Musculoskeletal: Negative for myalgias, back pain and joint pain.  Skin: Negative for itching and rash.  Neurological: Negative for dizziness, tremors, focal weakness, seizures and loss of consciousness.  Endo/Heme/Allergies: Negative for environmental allergies.  Psychiatric/Behavioral: Negative for depression, suicidal ideas and hallucinations.  All other systems reviewed and are negative.  Physical Exam: Blood pressure 118/72, pulse 62, temperature 97.7 F (36.5 C), temperature source Temporal, height 5\' 11"  (1.803 m), weight 273 lb 9.6 oz (124.1 kg), SpO2 98 %. Gen:      No acute distress HEENT:  EOMI, sclera anicteric Neck:     No masses; no thyromegaly Lungs:    Clear to auscultation bilaterally; normal respiratory effort CV:         Regular rate and rhythm; no murmurs Abd:      + bowel sounds; soft, non-tender; no palpable masses, no distension Ext:    No edema; adequate peripheral perfusion Skin:      Warm and dry; no rash Neuro: alert and oriented x 3 Psych: normal mood and affect  Data Reviewed: Imaging: High-resolution CT 12/24/2018-mild emphysema, traction bronchiectasis, patchy groundglass and reticulation.  More prominent in the upper lobes with no basal gradient.  Air-trapping present. I have  reviewed the images personally.  PFTs:. 01/06/2019 FVC 3 [66%], FEV1 2.61 [78%], F/F 87, TLC 4.33 [59%], DLCO 22.56 [85%] Moderate restriction with no obstruction.  Diffusion capacity is normal  Labs: Hypersensitivity panel 12/28/2018- Negative  Assessment:  Interstitial lung disease CT scan reviewed with alternate pattern of pulmonary fibrosis with upper lobe predominance.  This could be hypersensitivity pneumonitis though he does not have any significant exposures  We will give ILD questionnaire, check CTD serologies Discussed further work-up with patient.  He will likely need bronchoscopy with BAL and transbronchial biopsies as an initial test.  We will unable to schedule procedures for now due to Covid restrictions Reassess on return visit in 4 weeks  Plan/Recommendations: - CTD serologies  This appointment required 45 minutes of patient care (this includes precharting, chart review, review of results, face-to-face care, etc.).  Marshell Garfinkel MD [facility] 02/10/2019, 9:42 AM  CC: Redmond School, MD

## 2019-02-14 LAB — ANA,IFA RA DIAG PNL W/RFLX TIT/PATN
Anti Nuclear Antibody (ANA): NEGATIVE
Cyclic Citrullin Peptide Ab: <16 UNITS
Rheumatoid fact SerPl-aCnc: <14 IU/mL (ref <14)

## 2019-02-14 LAB — ANTI-SCLERODERMA ANTIBODY: Scleroderma (Scl-70) (ENA) Antibody, IgG: <1 AI

## 2019-02-14 LAB — ANCA SCREEN W REFLEX TITER: ANCA Screen: NEGATIVE

## 2019-02-14 LAB — SJOGREN'S SYNDROME ANTIBODS(SSA + SSB)
SSA (Ro) (ENA) Antibody, IgG: <1 AI
SSB (La) (ENA) Antibody, IgG: <1 AI

## 2019-02-17 ENCOUNTER — Telehealth: Payer: Self-pay | Admitting: Physician Assistant

## 2019-02-17 NOTE — Telephone Encounter (Signed)
Pt reported that he has been experiencing bloating and abdominal side pain.  Please advise.

## 2019-02-17 NOTE — Telephone Encounter (Signed)
Pt states he has been having a lot of stomach pain and pain on his right side. Pt also c/o a lot of reflux. Pt scheduled to see Ellouise Newer PA 02/25/19@2pm . Pt aware of appt.

## 2019-02-21 LAB — MYOMARKER 3 PLUS PROFILE (RDL)

## 2019-02-22 DIAGNOSIS — M5416 Radiculopathy, lumbar region: Secondary | ICD-10-CM | POA: Diagnosis not present

## 2019-02-25 ENCOUNTER — Ambulatory Visit: Payer: Medicare HMO | Admitting: Physician Assistant

## 2019-02-25 ENCOUNTER — Encounter: Payer: Self-pay | Admitting: Physician Assistant

## 2019-02-25 VITALS — BP 120/50 | HR 68 | Temp 97.9°F | Ht 70.0 in | Wt 276.5 lb

## 2019-02-25 DIAGNOSIS — K580 Irritable bowel syndrome with diarrhea: Secondary | ICD-10-CM

## 2019-02-25 DIAGNOSIS — R1011 Right upper quadrant pain: Secondary | ICD-10-CM

## 2019-02-25 DIAGNOSIS — R140 Abdominal distension (gaseous): Secondary | ICD-10-CM

## 2019-02-25 MED ORDER — CHOLESTYRAMINE 4 G PO PACK: 4.0000 g | PACK | Freq: Two times a day (BID) | ORAL | 1 refills | Status: DC

## 2019-02-25 NOTE — Progress Notes (Signed)
Reviewed and agree with management plan.  Mekiah Cambridge T. Devona Holmes, MD FACG [facility]  

## 2019-02-25 NOTE — Progress Notes (Signed)
Chief Complaint: Abdominal bloating, diarrhea, right upper quadrant discomfort  HPI:    Alexander Nunez is a 74 year old Caucasian male with a past medical history as listed below including IBS, known to Dr. Fuller Plan, who presents clinic today for complaint of right upper quadrant discomfort, diarrhea and bloating.    02/20/2017 colonoscopy with internal hemorrhoids, moderate diverticulosis in the left colon and otherwise normal.  Repeat recommended in 5 years.    10/14/2018 patient was seen in clinic for follow-up of diarrhea and abdominal pain.  At that time ordered CT abdomen pelvis with contrast for further eval as this did not sound like the patient's typical IBS pain.  Ordered a CBC and CMP.  Patient was told to finish Cipro and Flagyl as given by his PCP for suspected diverticulitis.  Also told to continue dicyclomine 4 times a day.  Ordered stool studies.    10/17/2018 CT abdomen and pelvis without contrast showed no acute findings in the abdomen or pelvis to account for patient's symptoms.  Hepatic steatosis and colonic diverticulosis without evidence of acute diverticulitis.    Today, the patient tells me that he has had a couple of flares of his IBS since being seen last.  Tells me that when this occurs he has a lot of excess gas and bloating and develops diarrhea.  Also starts with a right upper quadrant pain that seems to radiate around to his back.  Explains that it feels like a gallbladder pain but "I had my gallbladder out 6 years ago".  Explains that when this happens he has been calling his PCP and getting a 7-day prescription of twice daily Flagyl and when he takes this medicine it makes all the pain go away as well as the gas.  He has done this twice since being seen last.  He is just wondering if this is okay.  Tells me that typically his regular stools are not solid, they are more mushy.    Denies fever, chills, blood in his stool or symptoms that awaken him from sleep.  Past Medical History:   Diagnosis Date  . Anxiety   . Arthritis    back  . Back pain, chronic   . Colon polyp   . Diverticulitis   . GERD (gastroesophageal reflux disease)   . Heart palpitations   . High cholesterol   . Hypertension   . Irritable bowel syndrome   . Osteoporosis   . Psoriasis   . Sleep apnea    c- pap    Past Surgical History:  Procedure Laterality Date  . BACK SURGERY  2015  . CHOLECYSTECTOMY N/A 10/12/2015   Procedure: LAPAROSCOPIC CHOLECYSTECTOMY;  Surgeon: Aviva Signs, MD;  Location: [facility];  Service: General;  Laterality: N/A;  . COLONOSCOPY    . FOOT FRACTURE SURGERY     left foot  . KNEE ARTHROSCOPY     left  . TONSILLECTOMY    . UPPER GASTROINTESTINAL ENDOSCOPY      Current Outpatient Medications  Medication Sig Dispense Refill  . ALPRAZolam (XANAX) 0.5 MG tablet Take 0.25-0.5 mg by mouth at bedtime as needed for sleep.     Marland Kitchen aspirin EC 81 MG tablet Take 81 mg by mouth daily.    . Bacillus Coagulans-Inulin (PROBIOTIC FORMULA PO) Take 1 capsule by mouth every morning.    Marland Kitchen doxazosin (CARDURA) 4 MG tablet Take 4 mg by mouth at bedtime.      Marland Kitchen lisinopril (PRINIVIL,ZESTRIL) 10 MG tablet Take 10 mg by  mouth every evening.     . metoprolol tartrate (LOPRESSOR) 25 MG tablet Take 1 tablet (25 mg total) by mouth 2 (two) times daily. 60 tablet 0  . oxyCODONE-acetaminophen (PERCOCET/ROXICET) 5-325 MG tablet Take 1-2 tablets by mouth every 4 (four) hours as needed for moderate pain or severe pain. 50 tablet 0  . pantoprazole (PROTONIX) 40 MG tablet Take 1 tablet (40 mg total) by mouth 2 (two) times daily. (Patient taking differently: Take 40 mg by mouth daily. ) 60 tablet 11  . pravastatin (PRAVACHOL) 20 MG tablet Take 20 mg by mouth every evening.     . Simethicone (GAS-X EXTRA STRENGTH) 125 MG CAPS Take 250 mg by mouth daily as needed (for flatulence).     No current facility-administered medications for this visit.    Allergies as of 02/25/2019 - Review Complete 02/10/2019    Allergen Reaction Noted  . Cephalexin Other (See Comments) 03/07/2008  . Contrast media [iodinated diagnostic agents] Hives and Rash 09/17/2011    Family History  Problem Relation Age of Onset  . Arrhythmia Mother        Atrial fib  . Arrhythmia Sister        Atrial fib  . Stroke Sister   . Breast cancer Sister   . Bladder Cancer Father           . Colon cancer Neg Hx   . Stomach cancer Neg Hx   . Esophageal cancer Neg Hx   . Pancreatic cancer Neg Hx   . Prostate cancer Neg Hx   . Rectal cancer Neg Hx     Social History   Socioeconomic History  . Marital status: Married    Spouse name: Not on file  . Number of children: 2  . Years of education: Not on file  . Highest education level: Not on file  Occupational History  . Occupation: retired    Comment: Retired  Tobacco Use  . Smoking status: Former Smoker    Packs/day: 2.00    Types: Cigarettes    Start date: 05/22/1962    Quit date: 05/21/1973    Years since quitting: 45.7  . Smokeless tobacco: Never Used  . Tobacco comment: Quit 25 yrs now   Substance and Sexual Activity  . Alcohol use: Yes    Alcohol/week: 14.0 standard drinks    Types: 14 Shots of liquor per week    Comment: social  . Drug use: No  . Sexual activity: Yes    Partners: Female  Other Topics Concern  . Not on file  Social History Narrative   Has dog named Rockydooal   Social Determinants of Health   Financial Resource Strain:   . Difficulty of Paying Living Expenses: Not on file  Food Insecurity:   . Worried About Charity fundraiser in the Last Year: Not on file  . Ran Out of Food in the Last Year: Not on file  Transportation Needs:   . Lack of Transportation (Medical): Not on file  . Lack of Transportation (Non-Medical): Not on file  Physical Activity:   . Days of Exercise per Week: Not on file  . Minutes of Exercise per Session: Not on file  Stress:   . Feeling of Stress : Not on file  Social Connections:   . Frequency of  Communication with Friends and Family: Not on file  . Frequency of Social Gatherings with Friends and Family: Not on file  . Attends Religious Services: Not on file  .  Active Member of Clubs or Organizations: Not on file  . Attends Archivist Meetings: Not on file  . Marital Status: Not on file  Intimate Partner Violence:   . Fear of Current or Ex-Partner: Not on file  . Emotionally Abused: Not on file  . Physically Abused: Not on file  . Sexually Abused: Not on file    Review of Systems:    Constitutional: No weight loss, fever or chills Cardiovascular: No chest pain Respiratory: No SOB  Gastrointestinal: See HPI and otherwise negative   Physical Exam:  Vital signs: BP (!) 120/50 (BP Location: Left Arm, Patient Position: Sitting, Cuff Size: Normal)   Pulse 68   Temp 97.9 F (36.6 C)   Ht 5\' 10"  (1.778 m) Comment: height measured without shoes  Wt 276 lb 8 oz (125.4 kg)   BMI 39.67 kg/m   Constitutional:   Pleasant overweight Caucasian male appears to be in NAD, Well developed, Well nourished, alert and cooperative  Respiratory: Respirations even and unlabored. Lungs clear to auscultation bilaterally.   No wheezes, crackles, or rhonchi.  Cardiovascular: Normal S1, S2. No MRG. Regular rate and rhythm. No peripheral edema, cyanosis or pallor.  Gastrointestinal:  Soft, nondistended, nontender. No rebound or guarding. Increased BS all four quadrants, No appreciable masses or hepatomegaly. Rectal:  Not performed.  Psychiatric:  Demonstrates good judgement and reason without abnormal affect or behaviors.  No recent labs/imaging.  Assessment: 1.  IBS-D: Has flares every once in a while which are better after using Flagyl twice daily x7 days; consider SIBO 2.  Bloating 3.  Right upper quadrant discomfort: Could be associated with gas pain/adhesions  Plan: 1.  Discussed SIBO with the patient.  It is possible that he is having episodes of this which are better after  antibiotics.  It is not a bad thing to use antibiotics for this but I would not recommend doing it more than 3-4 times a year. 2.  At this time would recommend that he try Cholestyramine 4 g packets twice daily when he starts with his next flare to see if this helps at all with what might be bile salt diarrhea.  Prescribed #60 with 1 refill. 3.  Patient will follow in clinic as needed with [place].  Ellouise Newer, PA-C [facility] 02/25/2019, 2:03 PM  Cc: Redmond School, MD

## 2019-02-25 NOTE — Patient Instructions (Addendum)
If you are age 73 or older, your body mass index should be between 23-30. Your Body mass index is 39.67 kg/m. If this is out of the aforementioned range listed, please consider follow up with your Primary Care Provider.  If you are age 77 or younger, your body mass index should be between 19-25. Your Body mass index is 39.67 kg/m. If this is out of the aformentioned range listed, please consider follow up with your Primary Care Provider.   We have sent the following medications to your pharmacy for you to pick up at your convenience: Cholestyramine.

## 2019-03-08 DIAGNOSIS — Z6839 Body mass index (BMI) 39.0-39.9, adult: Secondary | ICD-10-CM | POA: Diagnosis not present

## 2019-03-08 DIAGNOSIS — J0100 Acute maxillary sinusitis, unspecified: Secondary | ICD-10-CM | POA: Diagnosis not present

## 2019-03-09 ENCOUNTER — Ambulatory Visit: Payer: Medicare HMO | Admitting: Physician Assistant

## 2019-03-11 ENCOUNTER — Other Ambulatory Visit: Payer: Self-pay | Admitting: Physician Assistant

## 2019-03-13 ENCOUNTER — Emergency Department (HOSPITAL_COMMUNITY): Payer: Medicare HMO

## 2019-03-13 ENCOUNTER — Encounter (HOSPITAL_COMMUNITY): Payer: Self-pay

## 2019-03-13 ENCOUNTER — Other Ambulatory Visit: Payer: Self-pay

## 2019-03-13 ENCOUNTER — Emergency Department (HOSPITAL_COMMUNITY)
Admission: EM | Admit: 2019-03-13 | Discharge: 2019-03-13 | Disposition: A | Discharge status: Home / Self Care | Payer: Medicare HMO | Attending: Emergency Medicine | Admitting: Emergency Medicine

## 2019-03-13 DIAGNOSIS — Z7982 Long term (current) use of aspirin: Secondary | ICD-10-CM | POA: Insufficient documentation

## 2019-03-13 DIAGNOSIS — R109 Unspecified abdominal pain: Secondary | ICD-10-CM | POA: Insufficient documentation

## 2019-03-13 DIAGNOSIS — Z79899 Other long term (current) drug therapy: Secondary | ICD-10-CM | POA: Diagnosis not present

## 2019-03-13 DIAGNOSIS — Z87891 Personal history of nicotine dependence: Secondary | ICD-10-CM | POA: Insufficient documentation

## 2019-03-13 DIAGNOSIS — I10 Essential (primary) hypertension: Secondary | ICD-10-CM | POA: Diagnosis not present

## 2019-03-13 DIAGNOSIS — R1010 Upper abdominal pain, unspecified: Secondary | ICD-10-CM | POA: Diagnosis not present

## 2019-03-13 LAB — COMPREHENSIVE METABOLIC PANEL
ALT: 18 U/L (ref 0–44)
AST: 18 U/L (ref 15–41)
Albumin: 4.2 g/dL (ref 3.5–5.0)
Alkaline Phosphatase: 62 U/L (ref 38–126)
Anion gap: 9 (ref 5–15)
BUN: 19 mg/dL (ref 8–23)
CO2: 27 mmol/L (ref 22–32)
Calcium: 9.1 mg/dL (ref 8.9–10.3)
Chloride: 102 mmol/L (ref 98–111)
Creatinine, Ser: 1.15 mg/dL (ref 0.61–1.24)
GFR calc Af Amer: >60 mL/min (ref >60)
GFR calc non Af Amer: >60 mL/min (ref >60)
Glucose, Bld: 101 mg/dL — ABNORMAL HIGH (ref 70–99)
Potassium: 4.1 mmol/L (ref 3.5–5.1)
Sodium: 138 mmol/L (ref 135–145)
Total Bilirubin: 1.1 mg/dL (ref 0.3–1.2)
Total Protein: 7.8 g/dL (ref 6.5–8.1)

## 2019-03-13 LAB — URINALYSIS, ROUTINE W REFLEX MICROSCOPIC
Bilirubin Urine: NEGATIVE
Glucose, UA: NEGATIVE mg/dL
Hgb urine dipstick: NEGATIVE
Ketones, ur: NEGATIVE mg/dL
Leukocytes,Ua: NEGATIVE
Nitrite: NEGATIVE
Protein, ur: NEGATIVE mg/dL
Specific Gravity, Urine: 1.004 — ABNORMAL LOW (ref 1.005–1.030)
pH: 6 (ref 5.0–8.0)

## 2019-03-13 LAB — CBC WITH DIFFERENTIAL/PLATELET
Abs Immature Granulocytes: 0.06 10*3/uL (ref 0.00–0.07)
Basophils Absolute: 0.1 10*3/uL (ref 0.0–0.1)
Basophils Relative: 1 %
Eosinophils Absolute: 0.2 10*3/uL (ref 0.0–0.5)
Eosinophils Relative: 3 %
HCT: 41.9 % (ref 39.0–52.0)
Hemoglobin: 13.6 g/dL (ref 13.0–17.0)
Immature Granulocytes: 1 %
Lymphocytes Relative: 29 %
Lymphs Abs: 2.9 10*3/uL (ref 0.7–4.0)
MCH: 32.3 pg (ref 26.0–34.0)
MCHC: 32.5 g/dL (ref 30.0–36.0)
MCV: 99.5 fL (ref 80.0–100.0)
Monocytes Absolute: 1.1 10*3/uL — ABNORMAL HIGH (ref 0.1–1.0)
Monocytes Relative: 11 %
Neutro Abs: 5.4 10*3/uL (ref 1.7–7.7)
Neutrophils Relative %: 55 %
Platelets: 278 10*3/uL (ref 150–400)
RBC: 4.21 MIL/uL — ABNORMAL LOW (ref 4.22–5.81)
RDW: 13.5 % (ref 11.5–15.5)
WBC: 9.8 10*3/uL (ref 4.0–10.5)
nRBC: 0 % (ref 0.0–0.2)

## 2019-03-13 LAB — LIPASE, BLOOD: Lipase: 31 U/L (ref 11–51)

## 2019-03-13 MED ORDER — HYDROMORPHONE HCL 1 MG/ML IJ SOLN
1.0000 mg | Freq: Once | INTRAMUSCULAR | Status: AC
  Administered 2019-03-13: 1 mg via INTRAVENOUS
  Filled 2019-03-13: qty 1

## 2019-03-13 MED ORDER — OXYCODONE-ACETAMINOPHEN 5-325 MG PO TABS: 1.0000 | ORAL_TABLET | ORAL | 0 refills | Status: DC | PRN

## 2019-03-13 MED ORDER — ONDANSETRON HCL 4 MG/2ML IJ SOLN
4.0000 mg | Freq: Once | INTRAMUSCULAR | Status: AC
  Administered 2019-03-13: 4 mg via INTRAVENOUS
  Filled 2019-03-13: qty 2

## 2019-03-13 MED ORDER — HYDROMORPHONE HCL 1 MG/ML IJ SOLN
0.5000 mg | Freq: Once | INTRAMUSCULAR | Status: AC
  Administered 2019-03-13: 0.5 mg via INTRAVENOUS
  Filled 2019-03-13: qty 1

## 2019-03-13 MED ORDER — VALACYCLOVIR HCL 1 G PO TABS: 1000.0000 mg | ORAL_TABLET | Freq: Three times a day (TID) | ORAL | 0 refills | Status: DC

## 2019-03-13 NOTE — ED Triage Notes (Signed)
Pt presents to ED with complaints of right sided flank pain started last night. Pt denies urinary symptoms

## 2019-03-13 NOTE — ED Provider Notes (Signed)
Medical screening examination/treatment/procedure(s) were conducted as a shared visit with non-physician practitioner(s) and myself.  I personally evaluated the patient during the encounter.      Patient seen by me along with the physician assistant.  Patient with some right-sided flank pain that started yesterday afternoon.  Kind of CVA down towards right flank area.  Got significantly worse at midnight last night.  To the point where patient is had to take some hydrocodone tablets x2 without any real effect.  Has had kidney stones in the past.  But states this does not remind him of that.  CT renal study without evidence of any renal stones or any significant abnormalities.  The skin in the area with no redness.  But the intensity of the pain tickly with the CT scan being negative it could represent developing shingles.  But obviously no rash at this point in time.  Labs have been ordered and are still pending.  Patient should be able to be discharged home based on the CT scan.  Also urinalysis negative.   Fredia Sorrow, MD 03/13/19 1501

## 2019-03-13 NOTE — ED Provider Notes (Signed)
[facility] EMERGENCY DEPARTMENT Provider Note   CSN: [id number] Arrival date & time: 03/13/19  1308     History Chief Complaint  Patient presents with  . Flank Pain    Alexander Nunez is a 74 y.o. male.  The history is provided by the patient. No language interpreter was used.  Flank Pain This is a new problem. The current episode started 12 to 24 hours ago. The problem occurs constantly. The problem has not changed since onset.Associated symptoms comments: Flank . Nothing aggravates the symptoms. Nothing relieves the symptoms. He has tried nothing for the symptoms. The treatment provided no relief.   Pt reports he began having severe pain in his right upper back to right side of abdomen last pm.  Pt reports he has a history of back pain.  Pt took percocet that he has for back pain with no relief.     Past Medical History:  Diagnosis Date  . Anxiety   . Arthritis    back  . Back pain, chronic   . Colon polyp   . Diverticulitis   . GERD (gastroesophageal reflux disease)   . Heart palpitations   . High cholesterol   . Hypertension   . Irritable bowel syndrome   . Osteoporosis   . Psoriasis   . Sleep apnea    c- pap    Patient Active Problem List   Diagnosis Date Noted  . Atypical chest pain 05/04/2014  . Sleep apnea 05/04/2014  . Abdominal bloating 07/22/2013  . Spermatocele 05/02/2013  . Chronic prostatitis 01/31/2013  . Pain in testicle 01/31/2013  . Weakness of left leg 08/12/2012  . Generalized abdominal pain 09/02/2011  . Lumbar canal stenosis 05/28/2011  . Degenerative arthritis of lumbar spine 05/28/2011  . SPL (spondylolisthesis) 05/28/2011  . PERSONAL HX COLONIC POLYPS 04/19/2009  . IRRITABLE BOWEL SYNDROME 04/06/2008  . ABDOMINAL BLOATING 03/07/2008  . CHANGE IN BOWELS 03/07/2008  . ABDOMINAL PAIN-MULTIPLE SITES 03/07/2008  . Essential hypertension 03/06/2008  . GERD 03/06/2008  . GASTRITIS 03/06/2008  . CHRONIC RESPIRATORY DISEASE ARISE PERINTL  PERIOD 03/06/2008  . HYPERLIPIDEMIA 02/06/2003    Past Surgical History:  Procedure Laterality Date  . BACK SURGERY  2015  . CHOLECYSTECTOMY N/A 10/12/2015   Procedure: LAPAROSCOPIC CHOLECYSTECTOMY;  Surgeon: Aviva Signs, MD;  Location: [facility];  Service: General;  Laterality: N/A;  . COLONOSCOPY    . FOOT FRACTURE SURGERY     left foot  . KNEE ARTHROSCOPY     left  . TONSILLECTOMY    . UPPER GASTROINTESTINAL ENDOSCOPY         Family History  Problem Relation Age of Onset  . Arrhythmia Mother        Atrial fib  . Arrhythmia Sister        Atrial fib  . Stroke Sister   . Breast cancer Sister   . Bladder Cancer Father           . Colon cancer Neg Hx   . Stomach cancer Neg Hx   . Esophageal cancer Neg Hx   . Pancreatic cancer Neg Hx   . Prostate cancer Neg Hx   . Rectal cancer Neg Hx     Social History   Tobacco Use  . Smoking status: Former Smoker    Packs/day: 2.00    Types: Cigarettes    Start date: 05/22/1962    Quit date: 05/21/1973    Years since quitting: 45.8  . Smokeless tobacco: Never Used  .  Tobacco comment: Quit 25 yrs now   Substance Use Topics  . Alcohol use: Yes    Alcohol/week: 14.0 standard drinks    Types: 14 Shots of liquor per week    Comment: social  . Drug use: No    Home Medications Prior to Admission medications   Medication Sig Start Date End Date Taking? Authorizing Provider  ALPRAZolam Duanne Moron) 0.5 MG tablet Take 0.25-0.5 mg by mouth at bedtime as needed for sleep.     [provider]  amoxicillin-clavulanate (AUGMENTIN) 875-125 MG tablet Take 1 tablet by mouth 2 (two) times daily. 03/10/19   [provider]  aspirin EC 81 MG tablet Take 81 mg by mouth daily.    [provider]  Bacillus Coagulans-Inulin (PROBIOTIC FORMULA PO) Take 1 capsule by mouth every morning.    [provider]  cholestyramine (QUESTRAN) 4 g packet Take 1 packet (4 g total) by mouth 2 (two) times daily. 02/25/19   Levin Erp, PA  doxazosin (CARDURA) 4 MG tablet Take 4 mg by mouth at bedtime.      [provider]  lisinopril (PRINIVIL,ZESTRIL) 10 MG tablet Take 10 mg by mouth every evening.     [provider]  metoprolol tartrate (LOPRESSOR) 25 MG tablet Take 1 tablet (25 mg total) by mouth 2 (two) times daily. 05/05/14   Black, Lezlie Octave, NP  oxyCODONE-acetaminophen (PERCOCET/ROXICET) 5-325 MG tablet Take 1-2 tablets by mouth every 4 (four) hours as needed for moderate pain or severe pain. 10/12/15   Aviva Signs, MD  pantoprazole (PROTONIX) 40 MG tablet Take 1 tablet (40 mg total) by mouth 2 (two) times daily. Patient taking differently: Take 40 mg by mouth daily.  10/07/11   Ladene Artist, MD  pravastatin (PRAVACHOL) 20 MG tablet Take 20 mg by mouth every evening.     [provider]  Simethicone (GAS-X EXTRA STRENGTH) 125 MG CAPS Take 250 mg by mouth daily as needed (for flatulence).    [provider]    Allergies    Cephalexin and Contrast media [iodinated diagnostic agents]  Review of Systems   Review of Systems  Genitourinary: Positive for flank pain.  All other systems reviewed and are negative.   Physical Exam Updated Vital Signs BP 120/68 (BP Location: Right Arm)   Pulse (!) 59   Temp 98 F (36.7 C) (Oral)   Resp 18   Ht 5\' 11"  (1.803 m)   Wt 125.2 kg   SpO2 98%   BMI 38.49 kg/m   Physical Exam Vitals and nursing note reviewed.  Constitutional:      Appearance: He is well-developed.  HENT:     Head: Normocephalic and atraumatic.  Eyes:     Conjunctiva/sclera: Conjunctivae normal.  Cardiovascular:     Rate and Rhythm: Normal rate and regular rhythm.     Heart sounds: No murmur.  Pulmonary:     Effort: Pulmonary effort is normal. No respiratory distress.     Breath sounds: Normal breath sounds.  Abdominal:     Palpations: Abdomen is soft.     Tenderness: There is no abdominal tenderness.  Musculoskeletal:        General: Normal range of  motion.     Cervical back: Neck supple.     Comments: Tender skin right flank,  No rash   Skin:    General: Skin is warm and dry.  Neurological:     Mental Status: He is alert.  Psychiatric:  Thought Content: Thought content normal.     ED Results / Procedures / Treatments   Labs (all labs ordered are listed, but only abnormal results are displayed) Labs Reviewed  URINALYSIS, ROUTINE W REFLEX MICROSCOPIC - Abnormal; Notable for the following components:      Result Value   Color, Urine STRAW (*)    Specific Gravity, Urine 1.004 (*)    All other components within normal limits  CBC WITH DIFFERENTIAL/PLATELET - Abnormal; Notable for the following components:   RBC 4.21 (*)    Monocytes Absolute 1.1 (*)    All other components within normal limits  COMPREHENSIVE METABOLIC PANEL - Abnormal; Notable for the following components:   Glucose, Bld 101 (*)    All other components within normal limits  LIPASE, BLOOD    EKG None  Radiology CT Renal Stone Study  Result Date: 03/13/2019 CLINICAL DATA:  74 year old male with a history of flank pain and possible kidney stone EXAM: CT ABDOMEN AND PELVIS WITHOUT CONTRAST TECHNIQUE: Multidetector CT imaging of the abdomen and pelvis was performed following the standard protocol without IV contrast. COMPARISON:  10/17/2018 FINDINGS: Lower chest: Mild reticulation of the interlobular septa, without thickening to suggest edema. No pleural effusion. The pattern is similar to the comparison CT of 10/17/2018 suggesting chronic changes. No acute finding of the lower chest. Hepatobiliary: Minimal coarse calcification of the liver, otherwise unremarkable. Cholecystectomy Pancreas: Unremarkable Spleen: Unremarkable Adrenals/Urinary Tract: - Right adrenal gland:  Unremarkable - Left adrenal gland: Unremarkable. - Right kidney: No hydronephrosis, nephrolithiasis, inflammation, or ureteral dilation. Persisting edema surrounding the right kidney. No focal  lesion. - Left Kidney: No hydronephrosis, nephrolithiasis, inflammation, or ureteral dilation. Persisting edema surrounding the left kidney. No focal lesion. - Urinary Bladder: Urinary bladder is relatively decompressed. No radiopaque stones within the bladder. Stomach/Bowel: - Stomach: Unremarkable. - Small bowel: Unremarkable - Appendix: Normal. - Colon: No significant stool burden. Colonic diverticula without inflammatory changes. Vascular/Lymphatic: Atherosclerotic calcifications of the abdominal aorta. No adenopathy Reproductive: Calcifications of the prostate. Transverse diameter of the prostate 4.5 cm Other: None Musculoskeletal: No acute displaced fracture. Mild degenerative changes. Surgical changes at L4-L5 of PLIF and discectomy with spacer placement. No bony canal narrowing. IMPRESSION: No acute CT finding of the abdomen/pelvis. Negative for radiopaque nephrolithiasis. Ancillary findings as above. Electronically Signed   By: Corrie Mckusick D.O.   On: 03/13/2019 14:30    Procedures Procedures (including critical care time)  Medications Ordered in ED Medications  HYDROmorphone (DILAUDID) injection 0.5 mg (0.5 mg Intravenous Given 03/13/19 1352)  ondansetron (ZOFRAN) injection 4 mg (4 mg Intravenous Given 03/13/19 1351)  HYDROmorphone (DILAUDID) injection 1 mg (1 mg Intravenous Given 03/13/19 1507)    ED Course  I have reviewed the triage vital signs and the nursing notes.  Pertinent labs & imaging results that were available during my care of the patient were reviewed by me and considered in my medical decision making (see chart for details).    MDM Rules/Calculators/A&P                      MDM: Ct is normal, ua and labs are normal.  Pt given dilaudid for pain. Dr. Rogene Houston in to see and examine.  Possible shingles pain, pt has not developed a rash but symptoms including skin tenderness may be early shingles pain.   Final Clinical Impression(s) / ED Diagnoses Final diagnoses:  Right  flank pain    Rx / DC Orders ED Discharge Orders  Ordered    valACYclovir (VALTREX) 1000 MG tablet  3 times daily     03/13/19 1551    oxyCODONE-acetaminophen (PERCOCET) 5-325 MG tablet  Every 4 hours PRN     03/13/19 1551        An After Visit Summary was printed and given to the patient.    Fransico Meadow, [place] 03/13/19 1551    Fredia Sorrow, MD 03/14/19 [phone]

## 2019-03-13 NOTE — Discharge Instructions (Signed)
Your exam, labs and ct scan are normal.  Watch carefully for a rash as pain maybe due to shingles.

## 2019-03-14 ENCOUNTER — Ambulatory Visit: Payer: Medicare HMO | Admitting: Pulmonary Disease

## 2019-03-14 ENCOUNTER — Ambulatory Visit: Payer: Medicare HMO | Admitting: Acute Care

## 2019-03-16 DIAGNOSIS — Z6839 Body mass index (BMI) 39.0-39.9, adult: Secondary | ICD-10-CM | POA: Diagnosis not present

## 2019-03-16 DIAGNOSIS — K219 Gastro-esophageal reflux disease without esophagitis: Secondary | ICD-10-CM | POA: Diagnosis not present

## 2019-03-16 DIAGNOSIS — I10 Essential (primary) hypertension: Secondary | ICD-10-CM | POA: Diagnosis not present

## 2019-03-16 DIAGNOSIS — M5414 Radiculopathy, thoracic region: Secondary | ICD-10-CM | POA: Diagnosis not present

## 2019-03-29 DIAGNOSIS — Z6838 Body mass index (BMI) 38.0-38.9, adult: Secondary | ICD-10-CM | POA: Diagnosis not present

## 2019-03-29 DIAGNOSIS — B029 Zoster without complications: Secondary | ICD-10-CM | POA: Diagnosis not present

## 2019-03-31 ENCOUNTER — Encounter: Payer: Self-pay | Admitting: Pulmonary Disease

## 2019-03-31 ENCOUNTER — Other Ambulatory Visit: Payer: Self-pay

## 2019-03-31 ENCOUNTER — Ambulatory Visit: Payer: Medicare HMO | Admitting: Pulmonary Disease

## 2019-03-31 VITALS — BP 120/78 | HR 72 | Temp 97.6°F | Ht 71.0 in | Wt 268.8 lb

## 2019-03-31 DIAGNOSIS — J84112 Idiopathic pulmonary fibrosis: Secondary | ICD-10-CM

## 2019-03-31 NOTE — Patient Instructions (Signed)
I am glad you are doing well with regard to your breathing  Since you are stable we can continue to wait and watch on the interstitial lung disease We will schedule a high-resolution CT, spirometry, diffusion capacity and 6-minute walk test in 6 months Follow-up in clinic after test.

## 2019-03-31 NOTE — Progress Notes (Signed)
Alexander Nunez    [id number]    [birth year]  Primary Care Physician:Fusco, Purcell Nails, MD  Referring Physician: Redmond School, MD [address] [place],  [place] [address]  Chief complaint: Follow-up for ILD, pulmonary fibrosis  HPI: 74 year old with history of hypertension, hyperlipidemia, sleep apnea, psoriasis Had an incidental finding of interstitial lung disease on CT abdomen which was done for diverticulitis He has seen Dr. Velvet Bathe at [facility] who did a high resolution CT with findings of alternate diagnosis, possible HP.  He had HP panel on 12/28/2018 which is negative. History notable for psoriasis with skin involvement.  He was on methotrexate for a couple of years and stopped as his skin rash improved.  Last dose of methotrexate was several years ago.  No respiratory complaints at present.  Denies any dyspnea, cough, sputum production  Pets: Has a dog.  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  His hobbies include gardening but no significant exposure to mold,No hot tub, Jacuzzi, no down pillows or comforters.  He uses talcum powder every day.  May have had Freon leak from his HVAC system. Likes to play golf.   Smoking history: 30-pack-year smoker.  Quit in 1980 Travel history: No significant travel history Relevant family history: No significant family history of lung disease.  Interval history: States that he is doing well with regard to the breathing.  No new complaints today.  Outpatient Encounter Medications as of 03/31/2019  Medication Sig  . ALPRAZolam (XANAX) 0.5 MG tablet Take 0.25-0.5 mg by mouth at bedtime as needed for sleep.   Marland Kitchen aspirin EC 81 MG tablet Take 81 mg by mouth daily.  . Bacillus Coagulans-Inulin (PROBIOTIC FORMULA PO) Take 1 capsule by mouth  every morning.  . cholestyramine (QUESTRAN) 4 g packet TAKE 1 PACKET (4 G TOTAL) BY MOUTH 2 (TWO) TIMES DAILY.  Marland Kitchen doxazosin (CARDURA) 4 MG tablet Take 4 mg by mouth at bedtime.    . gabapentin (NEURONTIN) 300 MG capsule Take 300 mg by mouth 3 (three) times daily.  Marland Kitchen lisinopril (PRINIVIL,ZESTRIL) 10 MG tablet Take 10 mg by mouth every evening.   . metoprolol tartrate (LOPRESSOR) 25 MG tablet Take 1 tablet (25 mg total) by mouth 2 (two) times daily.  Marland Kitchen oxyCODONE-acetaminophen (PERCOCET) 5-325 MG tablet Take 1 tablet by mouth every 4 (four) hours as needed for severe pain.  . pantoprazole (PROTONIX) 40 MG tablet Take 1 tablet (40 mg total) by mouth 2 (two) times daily. (Patient taking differently: Take 40 mg by mouth daily. )  . pravastatin (PRAVACHOL) 20 MG tablet Take 20 mg by mouth every evening.   . Simethicone (GAS-X EXTRA STRENGTH) 125 MG CAPS Take 250 mg by mouth daily as needed (for flatulence).  . valACYclovir (VALTREX) 1000 MG tablet Take 1 tablet (1,000 mg total) by mouth 3 (three) times daily.  . [DISCONTINUED] amoxicillin-clavulanate (AUGMENTIN) 875-125 MG tablet Take 1 tablet by mouth 2 (two) times daily.   No facility-administered encounter medications on file as of 03/31/2019.   Physical Exam: Blood pressure 120/78, pulse 72, temperature 97.6 F (36.4 C), temperature source Temporal, height 5\' 11"  (1.803 m), weight 268 lb 12.8 oz (121.9 kg), SpO2 97 %. Gen:      No acute distress, obese HEENT:  EOMI, sclera anicteric Neck:  No masses; no thyromegaly Lungs:    Clear to auscultation bilaterally; normal respiratory effort CV:         Regular rate and rhythm; no murmurs Abd:      + bowel sounds; soft, non-tender; no palpable masses, no distension Ext:    No edema; adequate peripheral perfusion Skin:      Warm and dry; no rash Neuro: alert and oriented x 3 Psych: normal mood and affect  Data Reviewed: Imaging: High-resolution CT 12/24/2018-mild emphysema, traction  bronchiectasis, patchy groundglass and reticulation.  More prominent in the upper lobes with no basal gradient.  Air-trapping present. I have reviewed the images personally.  PFTs:. 01/06/2019 FVC 3 [66%], FEV1 2.61 [78%], F/F 87, TLC 4.33 [59%], DLCO 22.56 [85%] Moderate restriction with no obstruction.  Diffusion capacity is normal  Labs: Hypersensitivity panel 12/28/2018- Negative CTD profile 02/10/2019-negative  Assessment:  Interstitial lung disease CT scan reviewed with alternate pattern of pulmonary fibrosis with upper lobe predominance.  This could be hypersensitivity pneumonitis though he does not have any significant exposures  Lab work including CTD serologies, hypersensitivity panel is negative.  Has remote exposure to asbestos but CT scan is not typical of asbestosis. Discussed for options for further work-up in detail with patient, including wait and watch with monitoring, bronchoscope with BAL, transbronchial biopsies or surgical lung biopsy.  Since he is asymptomatic and doing well with regard to his respiratory status we have decided to not work this up at present and monitor.  If he should develop symptoms of worsening imaging, PFTs then we can reconsider  Plan/Recommendations: - Follow-up high-res CT, PFTs, 6-minute walk test in 25-month  Marshell Garfinkel MD  Pulmonary and Critical Care 03/31/2019, 9:59 AM  CC: Redmond School, MD

## 2019-04-03 DIAGNOSIS — E7849 Other hyperlipidemia: Secondary | ICD-10-CM | POA: Diagnosis not present

## 2019-04-03 DIAGNOSIS — G894 Chronic pain syndrome: Secondary | ICD-10-CM | POA: Diagnosis not present

## 2019-04-03 DIAGNOSIS — R69 Illness, unspecified: Secondary | ICD-10-CM | POA: Diagnosis not present

## 2019-04-03 DIAGNOSIS — I10 Essential (primary) hypertension: Secondary | ICD-10-CM | POA: Diagnosis not present

## 2019-04-11 DIAGNOSIS — Z0100 Encounter for examination of eyes and vision without abnormal findings: Secondary | ICD-10-CM | POA: Diagnosis not present

## 2019-04-11 DIAGNOSIS — H524 Presbyopia: Secondary | ICD-10-CM | POA: Diagnosis not present

## 2019-04-11 DIAGNOSIS — H52203 Unspecified astigmatism, bilateral: Secondary | ICD-10-CM | POA: Diagnosis not present

## 2019-04-11 DIAGNOSIS — H5212 Myopia, left eye: Secondary | ICD-10-CM | POA: Diagnosis not present

## 2019-04-29 ENCOUNTER — Other Ambulatory Visit: Payer: Self-pay

## 2019-04-29 ENCOUNTER — Ambulatory Visit (INDEPENDENT_AMBULATORY_CARE_PROVIDER_SITE_OTHER): Payer: Medicare HMO

## 2019-04-29 ENCOUNTER — Ambulatory Visit
Admission: EM | Admit: 2019-04-29 | Discharge: 2019-04-29 | Disposition: A | Discharge status: Home / Self Care | Payer: Medicare HMO | Attending: Emergency Medicine | Admitting: Emergency Medicine

## 2019-04-29 DIAGNOSIS — M7989 Other specified soft tissue disorders: Secondary | ICD-10-CM | POA: Diagnosis not present

## 2019-04-29 DIAGNOSIS — S93402A Sprain of unspecified ligament of left ankle, initial encounter: Secondary | ICD-10-CM | POA: Diagnosis not present

## 2019-04-29 DIAGNOSIS — M25572 Pain in left ankle and joints of left foot: Secondary | ICD-10-CM | POA: Diagnosis not present

## 2019-04-29 DIAGNOSIS — M25472 Effusion, left ankle: Secondary | ICD-10-CM

## 2019-04-29 NOTE — ED Triage Notes (Signed)
Pt fell on left ankle about an hour ago, swelling noted

## 2019-04-29 NOTE — ED Provider Notes (Signed)
[facility]    CSN: [id number] Arrival date & time: 04/29/19  1612      History   Chief Complaint Chief Complaint  Patient presents with  . Ankle Injury    HPI Alexander Nunez is a 74 y.o. male.   Who presented to the urgent care with a complaint of left ankle pain for the past hour.  Developed after twisting his left leg.  He localizes the pain to the left ankle.  Described the pain as constant and achy, rated at 7 on a scale 1-10. Pain is  worse on range of motion.  Has tried OTC medication and oxycocone with relief.  He denies similar symptoms in the past.  Denies chills, fever, nausea, vomiting, diarrhea chest pain, chest tightness.   The history is provided by the patient. No language interpreter was used.    Past Medical History:  Diagnosis Date  . Anxiety   . Arthritis    back  . Back pain, chronic   . Colon polyp   . Diverticulitis   . GERD (gastroesophageal reflux disease)   . Heart palpitations   . High cholesterol   . Hypertension   . Irritable bowel syndrome   . Osteoporosis   . Psoriasis   . Sleep apnea    c- pap    Patient Active Problem List   Diagnosis Date Noted  . Atypical chest pain 05/04/2014  . Sleep apnea 05/04/2014  . Abdominal bloating 07/22/2013  . Spermatocele 05/02/2013  . Chronic prostatitis 01/31/2013  . Pain in testicle 01/31/2013  . Weakness of left leg 08/12/2012  . Generalized abdominal pain 09/02/2011  . Lumbar canal stenosis 05/28/2011  . Degenerative arthritis of lumbar spine 05/28/2011  . SPL (spondylolisthesis) 05/28/2011  . PERSONAL HX COLONIC POLYPS 04/19/2009  . IRRITABLE BOWEL SYNDROME 04/06/2008  . ABDOMINAL BLOATING 03/07/2008  . CHANGE IN BOWELS 03/07/2008  . ABDOMINAL PAIN-MULTIPLE SITES 03/07/2008  . Essential hypertension 03/06/2008  . GERD 03/06/2008  . GASTRITIS 03/06/2008  . CHRONIC RESPIRATORY DISEASE ARISE PERINTL PERIOD 03/06/2008  . HYPERLIPIDEMIA 02/06/2003    Past Surgical History:   Procedure Laterality Date  . BACK SURGERY  2015  . CHOLECYSTECTOMY N/A 10/12/2015   Procedure: LAPAROSCOPIC CHOLECYSTECTOMY;  Surgeon: Aviva Signs, MD;  Location: [facility];  Service: General;  Laterality: N/A;  . COLONOSCOPY    . FOOT FRACTURE SURGERY     left foot  . KNEE ARTHROSCOPY     left  . TONSILLECTOMY    . UPPER GASTROINTESTINAL ENDOSCOPY         Home Medications    Prior to Admission medications   Medication Sig Start Date End Date Taking? Authorizing Provider  ALPRAZolam Duanne Moron) 0.5 MG tablet Take 0.25-0.5 mg by mouth at bedtime as needed for sleep.     [provider]  aspirin EC 81 MG tablet Take 81 mg by mouth daily.    [provider]  Bacillus Coagulans-Inulin (PROBIOTIC FORMULA PO) Take 1 capsule by mouth every morning.    [provider]  cholestyramine (QUESTRAN) 4 g packet TAKE 1 PACKET (4 G TOTAL) BY MOUTH 2 (TWO) TIMES DAILY. 03/14/19   Levin Erp, PA  doxazosin (CARDURA) 4 MG tablet Take 4 mg by mouth at bedtime.      [provider]  gabapentin (NEURONTIN) 300 MG capsule Take 300 mg by mouth 3 (three) times daily. 03/29/19   [provider]  lisinopril (PRINIVIL,ZESTRIL) 10 MG tablet Take 10 mg by  mouth every evening.     [provider]  metoprolol tartrate (LOPRESSOR) 25 MG tablet Take 1 tablet (25 mg total) by mouth 2 (two) times daily. 05/05/14   Black, Lezlie Octave, NP  oxyCODONE-acetaminophen (PERCOCET) 5-325 MG tablet Take 1 tablet by mouth every 4 (four) hours as needed for severe pain. 03/13/19 03/12/20  Fransico Meadow, PA-C  pantoprazole (PROTONIX) 40 MG tablet Take 1 tablet (40 mg total) by mouth 2 (two) times daily. Patient taking differently: Take 40 mg by mouth daily.  10/07/11   Ladene Artist, MD  pravastatin (PRAVACHOL) 20 MG tablet Take 20 mg by mouth every evening.     [provider]  Simethicone (GAS-X EXTRA STRENGTH) 125 MG CAPS Take 250 mg by mouth daily as needed (for  flatulence).    [provider]  valACYclovir (VALTREX) 1000 MG tablet Take 1 tablet (1,000 mg total) by mouth 3 (three) times daily. 03/13/19   Fransico Meadow, PA-C    Family History Family History  Problem Relation Age of Onset  . Arrhythmia Mother        Atrial fib  . Arrhythmia Sister        Atrial fib  . Stroke Sister   . Breast cancer Sister   . Bladder Cancer Father           . Colon cancer Neg Hx   . Stomach cancer Neg Hx   . Esophageal cancer Neg Hx   . Pancreatic cancer Neg Hx   . Prostate cancer Neg Hx   . Rectal cancer Neg Hx     Social History Social History   Tobacco Use  . Smoking status: Former Smoker    Packs/day: 2.00    Types: Cigarettes    Start date: 05/22/1962    Quit date: 05/21/1973    Years since quitting: 45.9  . Smokeless tobacco: Never Used  . Tobacco comment: Quit 25 yrs now   Substance Use Topics  . Alcohol use: Yes    Alcohol/week: 14.0 standard drinks    Types: 14 Shots of liquor per week    Comment: social  . Drug use: No     Allergies   Cephalexin and Contrast media [iodinated diagnostic agents]   Review of Systems Review of Systems  Constitutional: Negative.   Respiratory: Negative.   Cardiovascular: Negative.   Musculoskeletal: Positive for joint swelling.       Ankle pain  All other systems reviewed and are negative.    Physical Exam Triage Vital Signs ED Triage Vitals  Enc Vitals Group     BP      Pulse      Resp      Temp      Temp src      SpO2      Weight      Height      Head Circumference      Peak Flow      Pain Score      Pain Loc      Pain Edu?      Excl. in [place]?    No data found.  Updated Vital Signs BP (!) 157/91   Pulse 81   Temp (!) 97.5 F (36.4 C)   Resp 18   SpO2 95%   Visual Acuity Right Eye Distance:   Left Eye Distance:   Bilateral Distance:    Right Eye Near:   Left Eye Near:    Bilateral Near:  Physical Exam Vitals and nursing note reviewed.    Constitutional:      General: He is not in acute distress.    Appearance: Normal appearance. He is normal weight. He is not ill-appearing, toxic-appearing or diaphoretic.  Cardiovascular:     Rate and Rhythm: Normal rate and regular rhythm.     Pulses: Normal pulses.     Heart sounds: Normal heart sounds. No murmur. No friction rub. No gallop.   Pulmonary:     Effort: Pulmonary effort is normal. No respiratory distress.     Breath sounds: Normal breath sounds. No stridor. No wheezing, rhonchi or rales.  Chest:     Chest wall: No tenderness.  Musculoskeletal:        General: Swelling and tenderness present.     Right lower leg: No edema.     Left lower leg: No edema.     Right ankle: Normal.     Left ankle: Swelling present. Tenderness present.     Comments: Patient unable to bear weight, use crutches to ambulate. The left ankle is without obvious asymmetry or deformity when compared to the right ankle. Patient is unable to flex/extend, invert/evert. No surface trauma, ecchymosis. Tibial pulse and sensation to light touch normal.   Neurological:     General: No focal deficit present.     Mental Status: He is alert and oriented to person, place, and time.     Cranial Nerves: No cranial nerve deficit.     Sensory: No sensory deficit.      UC Treatments / Results  Labs (all labs ordered are listed, but only abnormal results are displayed) Labs Reviewed - No data to display  EKG   Radiology No results found.  Procedures Procedures (including critical care time)  Medications Ordered in UC Medications - No data to display  Initial Impression / Assessment and Plan / UC Course  I have reviewed the triage vital signs and the nursing notes.  Pertinent labs & imaging results that were available during my care of the patient were reviewed by me and considered in my medical decision making (see chart for details).  Clinical Course as of Apr 29 1706  Fri Apr 29, 2019  1639 DG  Ankle Complete Left [KA]  1646 DG Ankle Complete Left [KA]  1646 DG Ankle Complete Left [KA]  1649 DG Ankle Complete Left [KA]    Clinical Course User Index [KA] Emerson Monte, FNP   Patient is stable at discharge.  Left ankle x-ray is negative for bony abnormality including fracture or dislocation.  I have reviewed the x-ray myself and the radiologist interpretation.  I am in agreement with the radiologist interpretation.  He was advised to continue to take ibuprofen, and oxycodone as prescribed for pain management.  Advised for weightbearing as tolerated.  To return or follow-up with PCP for worsening symptoms.  Final Clinical Impressions(s) / UC Diagnoses   Final diagnoses:  Sprain of left ankle, unspecified ligament, initial encounter     Discharge Instructions     Left ankle x-ray was negative for acute fracture or dislocation. Rest, ice and heat as needed Ensure adequate ROM as tolerated. Continue to take oxycodone as prescribed for severe pain Continue to take ibuprofen as needed for inflammation and pain relief Return here or follow-up with PCP if you have any new or worsening symptoms      ED Prescriptions    None     PDMP not reviewed this encounter.  Emerson Monte, FNP 04/29/19 1708

## 2019-04-29 NOTE — Discharge Instructions (Addendum)
Left ankle x-ray was negative for acute fracture or dislocation. Rest, ice and heat as needed Ensure adequate ROM as tolerated. Continue to take oxycodone as prescribed for severe pain Continue to take ibuprofen as needed for inflammation and pain relief Return here or follow-up with PCP if you have any new or worsening symptoms

## 2019-05-16 ENCOUNTER — Telehealth: Payer: Self-pay | Admitting: Pulmonary Disease

## 2019-05-16 DIAGNOSIS — K760 Fatty (change of) liver, not elsewhere classified: Secondary | ICD-10-CM | POA: Diagnosis not present

## 2019-05-16 DIAGNOSIS — K589 Irritable bowel syndrome without diarrhea: Secondary | ICD-10-CM | POA: Diagnosis not present

## 2019-05-16 DIAGNOSIS — E042 Nontoxic multinodular goiter: Secondary | ICD-10-CM | POA: Diagnosis not present

## 2019-05-16 DIAGNOSIS — E7849 Other hyperlipidemia: Secondary | ICD-10-CM | POA: Diagnosis not present

## 2019-05-16 DIAGNOSIS — E669 Obesity, unspecified: Secondary | ICD-10-CM | POA: Diagnosis not present

## 2019-05-16 DIAGNOSIS — D126 Benign neoplasm of colon, unspecified: Secondary | ICD-10-CM | POA: Diagnosis not present

## 2019-05-16 DIAGNOSIS — G4733 Obstructive sleep apnea (adult) (pediatric): Secondary | ICD-10-CM | POA: Diagnosis not present

## 2019-05-16 DIAGNOSIS — N411 Chronic prostatitis: Secondary | ICD-10-CM | POA: Diagnosis not present

## 2019-05-16 DIAGNOSIS — J8410 Pulmonary fibrosis, unspecified: Secondary | ICD-10-CM | POA: Diagnosis not present

## 2019-05-16 DIAGNOSIS — M5114 Intervertebral disc disorders with radiculopathy, thoracic region: Secondary | ICD-10-CM | POA: Diagnosis not present

## 2019-05-16 NOTE — Telephone Encounter (Signed)
ATC pt, no answer. Left message for pt to call back.  

## 2019-05-17 ENCOUNTER — Other Ambulatory Visit: Payer: Self-pay | Admitting: Endocrinology

## 2019-05-17 DIAGNOSIS — E042 Nontoxic multinodular goiter: Secondary | ICD-10-CM

## 2019-05-17 NOTE — Telephone Encounter (Signed)
Pt has been scheduled for an appointment with Aaron Edelman on 05/18/2019.

## 2019-05-17 NOTE — Progress Notes (Signed)
@Patient  ID: Alexander Nunez, male    DOB: January 02, 1946, 74 y.o.   MRN: [id number]  Chief Complaint  Patient presents with  . Follow-up    States he feels like his windpipe is getting smaller, having spasms. x1 week     Referring provider: Redmond School, MD  HPI:  74 year old male former smoker followed in our office for ILD,  chronic respiratory failure as well as sleep apnea  Past medical history: Hyperlipidemia, hypertension, GERD Smoking history: Former smoker Maintenance: None Patient of Dr. Vaughan Browner.   05/18/2019  - Visit    Patient was originally consulted with our practice in January/2021.  He is a former patient of Dr. Luan Pulling.  Patient was found to have interstitial lung disease.  Patient was last seen in our office in February/2021.  CT scan was reviewed showing a alternative pattern of pulmonary fibrosis with upper lobe predominance.  Felt to be hypersensitivity pneumonitis although patient has no significant exposures.  HP panel as well as connective tissue serologies have been negative.  Patient is asymptomatic at that time.  So will follow with a high-resolution CT chest, pulmonary function test and 6-minute walks in 6 months.  74 year old male former smoker presenting to office today as an acute visit.  Patient reporting that 1 to 2 weeks ago he started to have increased shortness of breath.  He felt that he was struggling to take a deep breath in.  He does report that he has had some cough as well as congestion that is also worsened over the last 2 to 3 weeks.  He suspects this may be allergies.  He also felt that potentially that his breathing was being driven from an anxiety component.  He reports that he took a Xanax and this did not help.  He did try Flonase for a few days but did not notice much improvement.  He contacted our office to be scheduled for a visit due to the acute worsening symptoms.  Questionaires / Pulmonary Flowsheets:   MMRC: No flowsheet data  found.  Tests:   02/10/2019-connective tissue labs >>>Negative  12/22/2018-CT chest high-res-pulmonary parenchymal pattern of fibrosis appears slightly more organized than on 07/11/2017 associated with air trapping, findings may be due to chronic hypersensitivity pneumonitis, alternative diagnosis, slight increase in size of mediastinal lymph nodes, dominant right thyroid nodule previously biopsied on 04/15/2017, enlarged pulmonary trunk, emphysema  12/27/2018-pulmonary function test-FVC 3.03 (67% predicted), postbronchodilator ratio 87, postbronchodilator FEV1 2.61 (78% predicted), no bronchodilator response, DLCO 22.56 (85% predicted)  FENO:  No results found for: NITRICOXIDE  PFT: PFT Results Latest Ref Rng & Units 12/27/2018  FVC-Pre L 3.03  FVC-Predicted Pre % 67  FVC-Post L 3.00  FVC-Predicted Post % 66  Pre FEV1/FVC % % 85  Post FEV1/FCV % % 87  FEV1-Pre L 2.57  FEV1-Predicted Pre % 77  FEV1-Post L 2.61  DLCO UNC% % 85  DLCO COR %Predicted % 125  TLC L 4.33  TLC % Predicted % 59  RV % Predicted % 47    WALK:  No flowsheet data found.  Imaging: DG Ankle Complete Left  Result Date: 04/29/2019 CLINICAL DATA:  Patient states that he twisted his left ankle today, pain with bearing weight, some swelling. Pain with medial rotation. No Hx fall with injury and swelling EXAM: LEFT ANKLE COMPLETE - 3+ VIEW COMPARISON:  None. FINDINGS: Ankle mortise intact. The talar dome is normal. No malleolar fracture. The calcaneus is normal. No fracture or dislocation. IMPRESSION: No fracture  or dislocation. Electronically Signed   By: Suzy Bouchard M.D.   On: 04/29/2019 16:42    Lab Results:  CBC    Component Value Date/Time   WBC 9.8 03/13/2019 1330   RBC 4.21 (L) 03/13/2019 1330   HGB 13.6 03/13/2019 1330   HCT 41.9 03/13/2019 1330   PLT 278 03/13/2019 1330   MCV 99.5 03/13/2019 1330   MCH 32.3 03/13/2019 1330   MCHC 32.5 03/13/2019 1330   RDW 13.5 03/13/2019 1330   LYMPHSABS  2.9 03/13/2019 1330   MONOABS 1.1 (H) 03/13/2019 1330   EOSABS 0.2 03/13/2019 1330   BASOSABS 0.1 03/13/2019 1330    BMET    Component Value Date/Time   NA 138 03/13/2019 1330   K 4.1 03/13/2019 1330   CL 102 03/13/2019 1330   CO2 27 03/13/2019 1330   GLUCOSE 101 (H) 03/13/2019 1330   BUN 19 03/13/2019 1330   CREATININE 1.15 03/13/2019 1330   CALCIUM 9.1 03/13/2019 1330   GFRNONAA >60 03/13/2019 1330   GFRAA >60 03/13/2019 1330    BNP    Component Value Date/Time   BNP 30.0 05/04/2014 1950    ProBNP No results found for: PROBNP  Specialty Problems      Pulmonary Problems   CHRONIC RESPIRATORY DISEASE ARISE PERINTL PERIOD    Qualifier: Diagnosis of  By: Laney Potash, Pam        Sleep apnea   Allergic rhinitis   ILD (interstitial lung disease) ([place])    12/22/2018-CT chest high-res-pulmonary parenchymal pattern of fibrosis appears slightly more organized than on 07/11/2017 associated with air trapping, findings may be due to chronic hypersensitivity pneumonitis, alternative diagnosis, slight increase in size of mediastinal lymph nodes, dominant right thyroid nodule previously biopsied on 04/15/2017, enlarged pulmonary trunk, emphysema      Shortness of breath      Allergies  Allergen Reactions  . Cephalexin Other (See Comments)    fever  . Contrast Media [Iodinated Diagnostic Agents] Hives and Rash    Had persistent rash with only 50mg  benadryl prior to spinal injection on 09/03/2017, try 13 hr prep in future.    Immunization History  Administered Date(s) Administered  . Influenza, High Dose Seasonal PF 10/11/2018  . Pneumococcal Polysaccharide-23 10/10/2016  . Tdap 08/14/2010, 07/25/2018    Past Medical History:  Diagnosis Date  . Anxiety   . Arthritis    back  . Back pain, chronic   . Colon polyp   . Diverticulitis   . GERD (gastroesophageal reflux disease)   . Heart palpitations   . High cholesterol   . Hypertension   . Irritable bowel syndrome    . Osteoporosis   . Psoriasis   . Sleep apnea    c- pap    Tobacco History: Social History   Tobacco Use  Smoking Status Former Smoker  . Packs/day: 2.00  . Types: Cigarettes  . Start date: 05/22/1962  . Quit date: 05/21/1973  . Years since quitting: 46.0  Smokeless Tobacco Never Used  Tobacco Comment   Quit 25 yrs now    Counseling given: Not Answered Comment: Quit 25 yrs now    Continue to not smoke  Outpatient Encounter Medications as of 05/18/2019  Medication Sig  . ALPRAZolam (XANAX) 0.5 MG tablet Take 0.25-0.5 mg by mouth at bedtime as needed for sleep.   Marland Kitchen aspirin EC 81 MG tablet Take 81 mg by mouth daily.  . Bacillus Coagulans-Inulin (PROBIOTIC FORMULA PO) Take 1 capsule by mouth every  morning.  . cholestyramine (QUESTRAN) 4 g packet TAKE 1 PACKET (4 G TOTAL) BY MOUTH 2 (TWO) TIMES DAILY.  Marland Kitchen doxazosin (CARDURA) 4 MG tablet Take 4 mg by mouth at bedtime.    . gabapentin (NEURONTIN) 300 MG capsule Take 300 mg by mouth 3 (three) times daily.  Marland Kitchen lisinopril (PRINIVIL,ZESTRIL) 10 MG tablet Take 10 mg by mouth every evening.   . metoprolol tartrate (LOPRESSOR) 25 MG tablet Take 1 tablet (25 mg total) by mouth 2 (two) times daily.  Marland Kitchen oxyCODONE-acetaminophen (PERCOCET) 5-325 MG tablet Take 1 tablet by mouth every 4 (four) hours as needed for severe pain.  . pantoprazole (PROTONIX) 40 MG tablet Take 1 tablet (40 mg total) by mouth 2 (two) times daily. (Patient taking differently: Take 40 mg by mouth daily. )  . pravastatin (PRAVACHOL) 20 MG tablet Take 20 mg by mouth every evening.   . Simethicone (GAS-X EXTRA STRENGTH) 125 MG CAPS Take 250 mg by mouth daily as needed (for flatulence).  . valACYclovir (VALTREX) 1000 MG tablet Take 1 tablet (1,000 mg total) by mouth 3 (three) times daily.   No facility-administered encounter medications on file as of 05/18/2019.     Review of Systems  Review of Systems  Constitutional: Negative for activity change, chills, fatigue, fever  and unexpected weight change.  HENT: Positive for congestion, postnasal drip and rhinorrhea. Negative for sinus pressure, sinus pain and sore throat.   Eyes: Negative.   Respiratory: Positive for shortness of breath. Negative for cough and wheezing.   Cardiovascular: Negative for chest pain and palpitations.  Gastrointestinal: Negative for constipation, diarrhea, nausea and vomiting.  Endocrine: Negative.   Genitourinary: Negative.   Musculoskeletal: Negative.   Skin: Negative.   Neurological: Negative for dizziness and headaches.  Psychiatric/Behavioral: Negative.  Negative for dysphoric mood. The patient is not nervous/anxious.   All other systems reviewed and are negative.    Physical Exam  BP 114/68   Pulse 62   Temp 98.4 F (36.9 C) (Temporal)   Ht 5\' 11"  (1.803 m)   Wt 270 lb (122.5 kg)   SpO2 98% Comment: on RA  BMI 37.66 kg/m   Wt Readings from Last 5 Encounters:  05/18/19 270 lb (122.5 kg)  03/31/19 268 lb 12.8 oz (121.9 kg)  03/13/19 276 lb (125.2 kg)  02/25/19 276 lb 8 oz (125.4 kg)  02/10/19 273 lb 9.6 oz (124.1 kg)    BMI Readings from Last 5 Encounters:  05/18/19 37.66 kg/m  03/31/19 37.49 kg/m  03/13/19 38.49 kg/m  02/25/19 39.67 kg/m  02/10/19 38.16 kg/m     Physical Exam Vitals and nursing note reviewed.  Constitutional:      General: He is not in acute distress.    Appearance: Normal appearance. He is obese.  HENT:     Head: Normocephalic and atraumatic.     Right Ear: Hearing, tympanic membrane, ear canal and external ear normal. There is no impacted cerumen.     Left Ear: Hearing, tympanic membrane, ear canal and external ear normal. There is no impacted cerumen.     Nose: Congestion and rhinorrhea present. No mucosal edema.     Right Turbinates: Not enlarged.     Left Turbinates: Not enlarged.     Mouth/Throat:     Pharynx: Oropharynx is clear.     Comments: Postnasal drip Mallampati 4 Eyes:     Pupils: Pupils are equal, round,  and reactive to light.  Cardiovascular:     Rate and Rhythm:  Normal rate and regular rhythm.     Pulses: Normal pulses.     Heart sounds: Normal heart sounds. No murmur.  Pulmonary:     Effort: Pulmonary effort is normal.     Breath sounds: Normal breath sounds. No decreased air movement. No decreased breath sounds, wheezing or rales.  Musculoskeletal:     Cervical back: Normal range of motion and neck supple. No rigidity or tenderness.     Right lower leg: No edema.     Left lower leg: No edema.  Lymphadenopathy:     Cervical: No cervical adenopathy.  Skin:    General: Skin is warm and dry.     Capillary Refill: Capillary refill takes less than 2 seconds.     Findings: No erythema or rash.  Neurological:     General: No focal deficit present.     Mental Status: He is alert and oriented to person, place, and time.     Motor: No weakness.     Coordination: Coordination normal.     Gait: Gait is intact. Gait normal.  Psychiatric:        Mood and Affect: Mood normal.        Behavior: Behavior normal. Behavior is cooperative.        Thought Content: Thought content normal.        Judgment: Judgment normal.       Assessment & Plan:   Allergic rhinitis AR flare on exam today Rhinorrhea Clear nasal mucus on physical exam Postnasal drip on exam Patient reporting 2 to 3 weeks of nasal congestion  Plan: Start daily antihistamine Continue Flonase 1 spray each nostril daily Can also consider starting nasal saline rinses if symptoms persist  ILD (interstitial lung disease) ([place]) Patient of Dr. Vaughan Browner Known ILD Currently stable, current plan as of February/2021 is to clinically monitor and repeat a high-resolution CT chest, pulmonary function test and 6-minute walk in 6 months. Patient declined  classifier, lung biopsy at this time.  Plan: Walk today Chest x-ray today Continue to clinically monitor Keep current plan of care for management of ILD  Sleep  apnea Plan: Continue CPAP use  Shortness of breath AR flare on exam today Lung sounds clear bilaterally Anxiety could be playing a component Patient with significant clear nasal drainage on physical exam  Plan: Start daily and histamine Continue Flonase Walk today in office Chest x-ray today Continue to clinically monitor Reviewed that if patient starts to have worsening symptoms to contact her office If patient has drops in oxygen levels, severe chest pain, or difficulty with breathing he needs to present to the emergency room    Return in about 4 months (around 09/17/2019), or if symptoms worsen or fail to improve, for Follow up for PFT, After Chest CT, Follow up with Dr. Vaughan Browner.   Lauraine Rinne, NP 05/18/2019   This appointment required 32 minutes of patient care (this includes precharting, chart review, review of results, face-to-face care, etc.).

## 2019-05-18 ENCOUNTER — Ambulatory Visit: Payer: Medicare HMO | Admitting: Pulmonary Disease

## 2019-05-18 ENCOUNTER — Other Ambulatory Visit: Payer: Self-pay

## 2019-05-18 ENCOUNTER — Encounter: Payer: Self-pay | Admitting: Pulmonary Disease

## 2019-05-18 ENCOUNTER — Ambulatory Visit (INDEPENDENT_AMBULATORY_CARE_PROVIDER_SITE_OTHER): Payer: Medicare HMO

## 2019-05-18 VITALS — BP 114/68 | HR 62 | Temp 98.4°F | Ht 71.0 in | Wt 270.0 lb

## 2019-05-18 DIAGNOSIS — G4733 Obstructive sleep apnea (adult) (pediatric): Secondary | ICD-10-CM

## 2019-05-18 DIAGNOSIS — R0602 Shortness of breath: Secondary | ICD-10-CM | POA: Diagnosis not present

## 2019-05-18 DIAGNOSIS — J309 Allergic rhinitis, unspecified: Secondary | ICD-10-CM | POA: Insufficient documentation

## 2019-05-18 DIAGNOSIS — J849 Interstitial pulmonary disease, unspecified: Secondary | ICD-10-CM

## 2019-05-18 MED ORDER — FLUTICASONE PROPIONATE 50 MCG/ACT NA SUSP: 1.0000 | Freq: Every day | NASAL | 2 refills | Status: DC

## 2019-05-18 NOTE — Assessment & Plan Note (Signed)
AR flare on exam today Rhinorrhea Clear nasal mucus on physical exam Postnasal drip on exam Patient reporting 2 to 3 weeks of nasal congestion  Plan: Start daily antihistamine Continue Flonase 1 spray each nostril daily Can also consider starting nasal saline rinses if symptoms persist

## 2019-05-18 NOTE — Addendum Note (Signed)
Addended by: Valerie Salts on: 05/18/2019 10:15 AM   Modules accepted: Orders

## 2019-05-18 NOTE — Patient Instructions (Addendum)
You were seen today by Lauraine Rinne, NP  for:   1. Allergic rhinitis, unspecified seasonality, unspecified trigger  Please start taking a daily antihistamine:  >>>choose one of: zyrtec, claritin, allegra, or xyzal  >>>these are over the counter medications  >>>can choose generic option  >>>take daily  >>>this medication helps with allergies, post nasal drip, and cough   Continue Flonase 1 spray each nostril daily  Start nasal saline rinses twice daily Use distilled water Shake well Get bottle lukewarm like a baby bottle   2. Shortness of breath  - DG Chest 2 View; Future  Walk today in office  3. ILD (interstitial lung disease) ([place])  - DG Chest 2 View; Future  Keep current plan of care as discussed at February/21 office visit.  Repeat breathing test, high-resolution CT chest and 6-minute walk in 4 months  4. Obstructive sleep apnea syndrome  We recommend that you continue using your CPAP daily >>>Keep up the hard work using your device >>> Goal should be wearing this for the entire night that you are sleeping, at least 4 to 6 hours  Remember:  . Do not drive or operate heavy machinery if tired or drowsy.  . Please notify the supply company and office if you are unable to use your device regularly due to missing supplies or machine being broken.  . Work on maintaining a healthy weight and following your recommended nutrition plan  . Maintain proper daily exercise and movement  . Maintaining proper use of your device can also help improve management of other chronic illnesses such as: Blood pressure, blood sugars, and weight management.   BiPAP/ CPAP Cleaning:  >>>Clean weekly, with Dawn soap, and bottle brush.  Set up to air dry. >>> Wipe mask out daily with wet wipe or towelette     We recommend today:  Orders Placed This Encounter  Procedures  . DG Chest 2 View    Standing Status:   Future    Standing Expiration Date:   07/17/2020    Order Specific Question:    Reason for Exam (SYMPTOM  OR DIAGNOSIS REQUIRED)    Answer:   short of breath, known ild    Order Specific Question:   Preferred imaging location?    Answer:   Internal    Order Specific Question:   Radiology Contrast Protocol - do NOT remove file path    Answer:   [email or web address]   Orders Placed This Encounter  Procedures  . DG Chest 2 View   No orders of the defined types were placed in this encounter.   Follow Up:    Return in about 4 months (around 09/17/2019), or if symptoms worsen or fail to improve, for Follow up for PFT, After Chest CT, Follow up with Dr. Vaughan Browner.   Please do your part to reduce the spread of COVID-19:      Reduce your risk of any infection  and COVID19 by using the similar precautions used for avoiding the common cold or flu:  Marland Kitchen Wash your hands often with soap and warm water for at least 20 seconds.  If soap and water are not readily available, use an alcohol-based hand sanitizer with at least 60% alcohol.  . If coughing or sneezing, cover your mouth and nose by coughing or sneezing into the elbow areas of your shirt or coat, into a tissue or into your sleeve (not your hands). Langley Gauss A MASK when in public  . Avoid shaking hands with  others and consider head nods or verbal greetings only. . Avoid touching your eyes, nose, or mouth with unwashed hands.  . Avoid close contact with people who are sick. . Avoid places or events with large numbers of people in one location, like concerts or sporting events. . If you have some symptoms but not all symptoms, continue to monitor at home and seek medical attention if your symptoms worsen. . If you are having a medical emergency, call 911.   [facility] [place] [facility] / e-Visit: [email or web address]         [facility]: [facility]: [id number]                    [facility]: [id number]     It is flu season:   >>> Best ways to protect herself from the flu: Receive the yearly flu vaccine, practice good hand hygiene washing with soap and also using hand sanitizer when available, eat a nutritious meals, get adequate rest, hydrate appropriately   Please contact the office if your symptoms worsen or you have concerns that you are not improving.   Thank you for choosing [facility] for your healthcare, and for allowing [place] to partner with you on your healthcare journey. I am thankful to be able to provide care to you today.   Wyn Quaker FNP-C

## 2019-05-18 NOTE — Assessment & Plan Note (Signed)
AR flare on exam today Lung sounds clear bilaterally Anxiety could be playing a component Patient with significant clear nasal drainage on physical exam  Plan: Start daily and histamine Continue Flonase Walk today in office Chest x-ray today Continue to clinically monitor Reviewed that if patient starts to have worsening symptoms to contact her office If patient has drops in oxygen levels, severe chest pain, or difficulty with breathing he needs to present to the emergency room

## 2019-05-18 NOTE — Assessment & Plan Note (Signed)
Plan: Continue CPAP use

## 2019-05-18 NOTE — Assessment & Plan Note (Signed)
Patient of Dr. Vaughan Browner Known ILD Currently stable, current plan as of February/2021 is to clinically monitor and repeat a high-resolution CT chest, pulmonary function test and 6-minute walk in 6 months. Patient declined  classifier, lung biopsy at this time.  Plan: Walk today Chest x-ray today Continue to clinically monitor Keep current plan of care for management of ILD

## 2019-06-03 DIAGNOSIS — G894 Chronic pain syndrome: Secondary | ICD-10-CM | POA: Diagnosis not present

## 2019-06-03 DIAGNOSIS — I10 Essential (primary) hypertension: Secondary | ICD-10-CM | POA: Diagnosis not present

## 2019-06-03 DIAGNOSIS — R69 Illness, unspecified: Secondary | ICD-10-CM | POA: Diagnosis not present

## 2019-06-03 DIAGNOSIS — E7849 Other hyperlipidemia: Secondary | ICD-10-CM | POA: Diagnosis not present

## 2019-06-06 ENCOUNTER — Ambulatory Visit
Admission: RE | Admit: 2019-06-06 | Discharge: 2019-06-06 | Disposition: A | Discharge status: Home / Self Care | Payer: Medicare HMO | Source: Ambulatory Visit | Attending: Endocrinology | Admitting: Endocrinology

## 2019-06-06 DIAGNOSIS — E042 Nontoxic multinodular goiter: Secondary | ICD-10-CM

## 2019-06-06 DIAGNOSIS — E041 Nontoxic single thyroid nodule: Secondary | ICD-10-CM | POA: Diagnosis not present

## 2019-06-17 DIAGNOSIS — M5416 Radiculopathy, lumbar region: Secondary | ICD-10-CM | POA: Diagnosis not present

## 2019-06-28 DIAGNOSIS — Z6839 Body mass index (BMI) 39.0-39.9, adult: Secondary | ICD-10-CM | POA: Diagnosis not present

## 2019-06-28 DIAGNOSIS — R002 Palpitations: Secondary | ICD-10-CM | POA: Diagnosis not present

## 2019-06-28 DIAGNOSIS — I10 Essential (primary) hypertension: Secondary | ICD-10-CM | POA: Diagnosis not present

## 2019-06-28 DIAGNOSIS — Z1389 Encounter for screening for other disorder: Secondary | ICD-10-CM | POA: Diagnosis not present

## 2019-06-28 DIAGNOSIS — K219 Gastro-esophageal reflux disease without esophagitis: Secondary | ICD-10-CM | POA: Diagnosis not present

## 2019-06-28 DIAGNOSIS — J9801 Acute bronchospasm: Secondary | ICD-10-CM | POA: Diagnosis not present

## 2019-06-28 DIAGNOSIS — R0602 Shortness of breath: Secondary | ICD-10-CM | POA: Diagnosis not present

## 2019-07-21 NOTE — Progress Notes (Signed)
CARDIOLOGY CONSULT NOTE       Patient ID: Alexander Nunez MRN: [id number] DOB/AGE: 1945/09/17 74 y.o.  Admit date: (Not on file) Referring Physician: Gerarda Fraction Primary Physician: Redmond School, MD Primary Cardiologist: New Reason for Consultation: Palpitations  Active Problems:   * No active hospital problems. *   HPI:  74 y.o. referred by Dr Gerarda Fraction for palpitations. History of anxiety, HLD, HTN, and OSA. During office visit complained of moderate exertional dyspnea and wheezing This was associated with palpitations He is a distant smoker His CXR showed chronic ILD on 05/18/19 F/U CT ordered but not done yet Seen by [facility] 05/18/19 Noted multiple pulmonary issues including OSA, ILD and reactive airway disease former smoker. Cough / congestion worse feels like his wind pipe was getting smaller Tried xanax and allergy medicine without improvement  Connective tissue serologies have been negative   12/27/2018-pulmonary function test-FVC 3.03 (67% predicted), postbronchodilator ratio 87, postbronchodilator FEV1 2.61 (78% predicted), no bronchodilator response, DLCO 22.56 (85% predicted)  Exam was benign ? Anxiety only Rx antihistamine and continue CPAP use  He is overdue to have his PFTls and CT. 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 Anxiety   . Arthritis    back  . Back pain, chronic   . Colon polyp   . Diverticulitis   . GERD (gastroesophageal reflux disease)   . Heart palpitations   . High cholesterol   . Hypertension   . Irritable bowel syndrome   . Osteoporosis   . Psoriasis   . Sleep apnea    c- pap    Family History   Problem Relation Age of Onset  . Arrhythmia Mother        Atrial fib  . Arrhythmia Sister        Atrial fib  . Stroke Sister   . Breast cancer Sister   . Bladder Cancer Father           . Colon cancer Neg Hx   . Stomach cancer Neg Hx   . Esophageal cancer Neg Hx   . Pancreatic cancer Neg Hx   . Prostate cancer Neg Hx   . Rectal cancer Neg Hx     Social History   Socioeconomic History  . Marital status: Married    Spouse name: Not on file  . Number of children: 2  . Years of education: Not on file  . Highest education level: Not on file  Occupational History  . Occupation: retired    Comment: Retired  Tobacco Use  . Smoking status: Former Smoker    Packs/day: 2.00    Types: Cigarettes    Start date: 05/22/1962    Quit date: 05/21/1973    Years since quitting: 46.2  . Smokeless tobacco: Never Used  . Tobacco comment: Quit 25 yrs now   Vaping Use  . Vaping Use: Never used  Substance and Sexual Activity  . Alcohol use: Yes    Alcohol/week: 14.0 standard drinks    Types: 14 Shots of liquor per week    Comment: social  . Drug use: No  . Sexual activity: Yes  Partners: Female  Other Topics Concern  . Not on file  Social History Narrative   Has dog named Rockydooal   Social Determinants of Health   Financial Resource Strain:   . Difficulty of Paying Living Expenses:   Food Insecurity:   . Worried About Charity fundraiser in the Last Year:   . Arboriculturist in the Last Year:   Transportation Needs:   . Film/video editor (Medical):   Marland Kitchen Lack of Transportation (Non-Medical):   Physical Activity:   . Days of Exercise per Week:   . Minutes of Exercise per Session:   Stress:   . Feeling of Stress :   Social Connections:   . Frequency of Communication with Friends and Family:   . Frequency of Social Gatherings with Friends and Family:   . Attends Religious Services:   . Active Member of Clubs or Organizations:   . Attends Archivist Meetings:    Marland Kitchen Marital Status:   Intimate Partner Violence:   . Fear of Current or Ex-Partner:   . Emotionally Abused:   Marland Kitchen Physically Abused:   . Sexually Abused:     Past Surgical History:  Procedure Laterality Date  . BACK SURGERY  2015  . CHOLECYSTECTOMY N/A 10/12/2015   Procedure: LAPAROSCOPIC CHOLECYSTECTOMY;  Surgeon: Aviva Signs, MD;  Location: [facility];  Service: General;  Laterality: N/A;  . COLONOSCOPY    . FOOT FRACTURE SURGERY     left foot  . KNEE ARTHROSCOPY     left  . TONSILLECTOMY    . UPPER GASTROINTESTINAL ENDOSCOPY        Current Outpatient Medications:  .  albuterol (VENTOLIN HFA) 108 (90 Base) MCG/ACT inhaler, Inhale into the lungs as needed., Disp: , Rfl:  .  ALPRAZolam (XANAX) 0.5 MG tablet, Take 0.25-0.5 mg by mouth at bedtime as needed for sleep. , Disp: , Rfl:  .  aspirin EC 81 MG tablet, Take 81 mg by mouth daily., Disp: , Rfl:  .  Bacillus Coagulans-Inulin (PROBIOTIC FORMULA PO), Take 1 capsule by mouth every morning., Disp: , Rfl:  .  doxazosin (CARDURA) 4 MG tablet, Take 4 mg by mouth at bedtime.  , Disp: , Rfl:  .  fluticasone (FLONASE) 50 MCG/ACT nasal spray, Place 1 spray into both nostrils daily., Disp: 16 g, Rfl: 2 .  lisinopril (PRINIVIL,ZESTRIL) 10 MG tablet, Take 10 mg by mouth every evening. , Disp: , Rfl:  .  metoprolol tartrate (LOPRESSOR) 25 MG tablet, Take 1 tablet (25 mg total) by mouth 2 (two) times daily., Disp: 60 tablet, Rfl: 0 .  oxyCODONE-acetaminophen (PERCOCET) 5-325 MG tablet, Take 1 tablet by mouth every 4 (four) hours as needed for severe pain., Disp: 20 tablet, Rfl: 0 .  pantoprazole (PROTONIX) 40 MG tablet, Take 40 mg by mouth daily., Disp: , Rfl:  .  pravastatin (PRAVACHOL) 20 MG tablet, Take 20 mg by mouth every evening. , Disp: , Rfl:  .  Simethicone (GAS-X EXTRA STRENGTH) 125 MG CAPS, Take 250 mg by mouth daily as needed (for flatulence)., Disp: , Rfl:     Physical Exam: Blood pressure 132/78, pulse 65, height 5\' 11"  (1.803 m),  weight 274 lb (124.3 kg), SpO2 97 %.   Affect appropriate Overweight white male  HEENT: normal Neck supple with no adenopathy JVP normal no bruits no thyromegaly Lungs clear with no wheezing and good diaphragmatic motion Heart:  S1/S2 no murmur, no rub, gallop or click PMI normal Abdomen:  benighn, BS positve, no tenderness, no AAA no bruit.  No HSM or HJR Distal pulses intact with no bruits No edema Neuro non-focal Skin warm and dry No muscular weakness Previous lumbar fusion   Labs:   Lab Results  Component Value Date   WBC 9.8 03/13/2019   HGB 13.6 03/13/2019   HCT 41.9 03/13/2019   MCV 99.5 03/13/2019   PLT 278 03/13/2019   No results for input(s): NA, K, CL, CO2, BUN, CREATININE, CALCIUM, PROT, BILITOT, ALKPHOS, ALT, AST, GLUCOSE in the last 168 hours.  Invalid input(s): LABALBU Lab Results  Component Value Date   TROPONINI <0.03 07/11/2017   No results found for: CHOL No results found for: HDL No results found for: LDLCALC No results found for: TRIG No results found for: CHOLHDL No results found for: LDLDIRECT    Radiology: No results found.  EKG: SR rate 65 ICRBBB low voltage  07/26/19 SR rate 65 normal    ASSESSMENT AND PLAN:   1. Palpitations:  Related to flair of lung disease Already on beta blocker Will assess with echo and 14 day event monitor but likely no serious arrhythmia Important to keep lung disease Rx to prevent symptoms  2. Pulmonary:  F/u [facility] with CT scan has ILD, OSA former smoker with allergic flairs 3. HLD:  Continue statin labs with primary  4. HTN:  Continue beta blocker and ACE low sodium DASH diet stable  5. Obesity: discussed low carb diet Both his breathing / back would be much better with weight loss 6. Lumbar: previous L45 fusion with residual L3 disease 6 years ago f/u Dr Carloyn Manner   14 day event monitor Echo   F/U PRN pending testing   Signed: Jenkins Rouge 07/26/2019, 1:45 PM

## 2019-07-26 ENCOUNTER — Encounter: Payer: Self-pay | Admitting: Cardiovascular Disease

## 2019-07-26 ENCOUNTER — Ambulatory Visit: Payer: Medicare HMO | Admitting: Cardiovascular Disease

## 2019-07-26 ENCOUNTER — Other Ambulatory Visit: Payer: Self-pay

## 2019-07-26 ENCOUNTER — Ambulatory Visit (INDEPENDENT_AMBULATORY_CARE_PROVIDER_SITE_OTHER): Payer: Medicare HMO

## 2019-07-26 VITALS — BP 132/78 | HR 65 | Ht 71.0 in | Wt 274.0 lb

## 2019-07-26 DIAGNOSIS — R0600 Dyspnea, unspecified: Secondary | ICD-10-CM

## 2019-07-26 DIAGNOSIS — R002 Palpitations: Secondary | ICD-10-CM | POA: Diagnosis not present

## 2019-07-26 NOTE — Patient Instructions (Signed)
Medication Instructions:  Your physician recommends that you continue on your current medications as directed. Please refer to the Current Medication list given to you today.  *If you need a refill on your cardiac medications before your next appointment, please call your pharmacy*   Lab Work: None today If you have labs (blood work) drawn today and your tests are completely normal, you will receive your results only by: Marland Kitchen MyChart Message (if you have MyChart) OR . A paper copy in the mail If you have any lab test that is abnormal or we need to change your treatment, we will call you to review the results.   Testing/Procedures: Your physician has requested that you have an echocardiogram. Echocardiography is a painless test that uses sound waves to create images of your heart. It provides your doctor with information about the size and shape of your heart and how well your heart's chambers and valves are working. This procedure takes approximately one hour. There are no restrictions for this procedure.  Your physician has recommended that you wear an event monitor for 14 days (ZIO) . Event monitors are medical devices that record the heart's electrical activity. Doctors most often [place] these monitors to diagnose arrhythmias. Arrhythmias are problems with the speed or rhythm of the heartbeat. The monitor is a small, portable device. You can wear one while you do your normal daily activities. This is usually used to diagnose what is causing palpitations/syncope (passing out).     Follow-Up: At [facility], you and your health needs are our priority.  As part of our continuing mission to provide you with exceptional heart care, we have created designated Provider Care Teams.  These Care Teams include your primary Cardiologist (physician) and Advanced Practice Providers (APPs -  Physician Assistants and Nurse Practitioners) who all work together to provide you with the care you need, when you  need it.  We recommend signing up for the patient portal called "MyChart".  Sign up information is provided on this After Visit Summary.  MyChart is used to connect with patients for Virtual Visits (Telemedicine).  Patients are able to view lab/test results, encounter notes, upcoming appointments, etc.  Non-urgent messages can be sent to your provider as well.   To learn more about what you can do with MyChart, go to [email or web address].    Your next appointment:  As needed with Dr.Nishan      Thank you for choosing [facility] !

## 2019-08-11 ENCOUNTER — Ambulatory Visit (HOSPITAL_COMMUNITY)
Admission: RE | Admit: 2019-08-11 | Discharge: 2019-08-11 | Disposition: A | Discharge status: Home / Self Care | Payer: Medicare HMO | Source: Ambulatory Visit | Attending: Cardiovascular Disease | Admitting: Cardiovascular Disease

## 2019-08-11 ENCOUNTER — Other Ambulatory Visit: Payer: Self-pay

## 2019-08-11 DIAGNOSIS — R0600 Dyspnea, unspecified: Secondary | ICD-10-CM | POA: Diagnosis not present

## 2019-08-11 NOTE — Progress Notes (Signed)
*  PRELIMINARY RESULTS* Echocardiogram 2D Echocardiogram has been performed.  Alexander Nunez 08/11/2019, 10:17 AM

## 2019-08-22 DIAGNOSIS — R002 Palpitations: Secondary | ICD-10-CM | POA: Diagnosis not present

## 2019-08-23 ENCOUNTER — Other Ambulatory Visit: Payer: Self-pay

## 2019-08-23 ENCOUNTER — Telehealth: Payer: Self-pay | Admitting: Cardiovascular Disease

## 2019-08-23 NOTE — Telephone Encounter (Signed)
This encounter was created in error - please disregard.

## 2019-08-23 NOTE — Telephone Encounter (Signed)
Called patient with results as seen below. Josue Hector, MD  Michaelyn Barter, RN NSR  No significant arrhythmias

## 2019-08-23 NOTE — Telephone Encounter (Signed)
Patient states he is returning a call to Dr. Kyla Balzarine nurse.

## 2019-09-12 DIAGNOSIS — J019 Acute sinusitis, unspecified: Secondary | ICD-10-CM | POA: Diagnosis not present

## 2019-09-12 DIAGNOSIS — Z6841 Body Mass Index (BMI) 40.0 and over, adult: Secondary | ICD-10-CM | POA: Diagnosis not present

## 2019-09-12 DIAGNOSIS — M5136 Other intervertebral disc degeneration, lumbar region: Secondary | ICD-10-CM | POA: Diagnosis not present

## 2019-09-19 ENCOUNTER — Ambulatory Visit (HOSPITAL_COMMUNITY)
Admission: RE | Admit: 2019-09-19 | Discharge: 2019-09-19 | Disposition: A | Discharge status: Home / Self Care | Payer: Medicare HMO | Source: Ambulatory Visit | Attending: Pulmonary Disease | Admitting: Pulmonary Disease

## 2019-09-19 ENCOUNTER — Other Ambulatory Visit: Payer: Self-pay

## 2019-09-19 DIAGNOSIS — I272 Pulmonary hypertension, unspecified: Secondary | ICD-10-CM | POA: Diagnosis not present

## 2019-09-19 DIAGNOSIS — J84112 Idiopathic pulmonary fibrosis: Secondary | ICD-10-CM | POA: Diagnosis not present

## 2019-09-19 DIAGNOSIS — J8410 Pulmonary fibrosis, unspecified: Secondary | ICD-10-CM | POA: Diagnosis not present

## 2019-09-19 DIAGNOSIS — I700 Atherosclerosis of aorta: Secondary | ICD-10-CM | POA: Diagnosis not present

## 2019-09-19 DIAGNOSIS — I2510 Atherosclerotic heart disease of native coronary artery without angina pectoris: Secondary | ICD-10-CM | POA: Diagnosis not present

## 2019-10-03 ENCOUNTER — Encounter: Payer: Self-pay | Admitting: Pulmonary Disease

## 2019-10-03 ENCOUNTER — Ambulatory Visit: Payer: Medicare HMO | Admitting: Pulmonary Disease

## 2019-10-03 ENCOUNTER — Ambulatory Visit (INDEPENDENT_AMBULATORY_CARE_PROVIDER_SITE_OTHER): Payer: Medicare HMO | Admitting: Pulmonary Disease

## 2019-10-03 ENCOUNTER — Other Ambulatory Visit: Payer: Self-pay

## 2019-10-03 VITALS — BP 130/70 | HR 61 | Ht 71.0 in | Wt 276.0 lb

## 2019-10-03 DIAGNOSIS — J849 Interstitial pulmonary disease, unspecified: Secondary | ICD-10-CM | POA: Diagnosis not present

## 2019-10-03 DIAGNOSIS — J84112 Idiopathic pulmonary fibrosis: Secondary | ICD-10-CM | POA: Diagnosis not present

## 2019-10-03 LAB — PULMONARY FUNCTION TEST
DL/VA % pred: 120 %
DL/VA: 4.78 ml/min/mmHg/L
DLCO cor % pred: 81 %
DLCO cor: 21.35 ml/min/mmHg
DLCO unc % pred: 81 %
DLCO unc: 21.35 ml/min/mmHg
FEF 25-75 Pre: 3.44 L/sec
FEF2575-%Pred-Pre: 142 %
FEV1-%Pred-Pre: 75 %
FEV1-Pre: 2.47 L
FEV1FVC-%Pred-Pre: 116 %
FEV6-%Pred-Pre: 66 %
FEV6-Pre: 2.83 L
FEV6FVC-%Pred-Pre: 103 %
FVC-%Pred-Pre: 64 %
FVC-Pre: 2.9 L
Pre FEV1/FVC ratio: 85 %
Pre FEV6/FVC Ratio: 98 %

## 2019-10-03 NOTE — Progress Notes (Signed)
Spirometry and Dlco done today. 

## 2019-10-03 NOTE — Patient Instructions (Signed)
Your CT scan and lung function test shows very slight worsening I am glad that your symptoms are stable We had discussed options and decided on waiting and watching I will order full PFTs in 6 months and follow-up in clinic after that  Please give [place] a call sooner if there is any change in his symptoms.

## 2019-10-03 NOTE — Progress Notes (Signed)
Alexander Nunez    [id number]    [birth year]  Primary Care Physician:Fusco, Purcell Nails, MD  Referring Physician: Redmond School, MD [address] [place],  [place] [address]  Chief complaint: Follow-up for ILD, pulmonary fibrosis  HPI: 74 year old with history of hypertension, hyperlipidemia, sleep apnea, psoriasis Had an incidental finding of interstitial lung disease on CT abdomen which was done for diverticulitis He has seen Dr. Velvet Bathe at [facility] who did a high resolution CT with findings of alternate diagnosis, possible HP.  He had HP panel on 12/28/2018 which is negative. History notable for psoriasis with skin involvement.  He was on methotrexate for a couple of years and stopped as his skin rash improved.  Last dose of methotrexate was several years ago.  No respiratory complaints at present.  Denies any dyspnea, cough, sputum production  Pets: Has a dog.  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  His hobbies include gardening but no significant exposure to mold,No hot tub, Jacuzzi, no down pillows or comforters.  He uses talcum powder every day.  May have had Freon leak from his HVAC system. Likes to play golf.   Smoking history: 30-pack-year smoker.  Quit in 1980 Travel history: No significant travel history Relevant family history: No significant family history of lung disease.  Interval history: States that he is doing well with regard to the breathing.  No new complaints today. He is here for review of his CT and pulmonary function tests  Outpatient Encounter Medications as of 10/03/2019  Medication Sig  . albuterol (VENTOLIN HFA) 108 (90 Base) MCG/ACT inhaler Inhale into the lungs as needed.  . ALPRAZolam (XANAX) 0.5 MG tablet Take 0.25-0.5 mg by mouth at bedtime  as needed for sleep.   Marland Kitchen aspirin EC 81 MG tablet Take 81 mg by mouth daily.  . Bacillus Coagulans-Inulin (PROBIOTIC FORMULA PO) Take 1 capsule by mouth every morning.  Marland Kitchen doxazosin (CARDURA) 4 MG tablet Take 4 mg by mouth at bedtime.    . fluticasone (FLONASE) 50 MCG/ACT nasal spray Place 1 spray into both nostrils daily.  Marland Kitchen lisinopril (PRINIVIL,ZESTRIL) 10 MG tablet Take 10 mg by mouth every evening.   . metoprolol tartrate (LOPRESSOR) 25 MG tablet Take 1 tablet (25 mg total) by mouth 2 (two) times daily.  Marland Kitchen oxyCODONE-acetaminophen (PERCOCET) 5-325 MG tablet Take 1 tablet by mouth every 4 (four) hours as needed for severe pain.  . pantoprazole (PROTONIX) 40 MG tablet Take 40 mg by mouth daily.  . pravastatin (PRAVACHOL) 20 MG tablet Take 20 mg by mouth every evening.   . Simethicone (GAS-X EXTRA STRENGTH) 125 MG CAPS Take 250 mg by mouth daily as needed (for flatulence).   No facility-administered encounter medications on file as of 10/03/2019.   Physical Exam: Blood pressure 130/70, pulse 61, height 5\' 11"  (1.803 m), weight 276 lb (125.2 kg), SpO2 96 %. Gen:      No acute distress, obese HEENT:  EOMI, sclera anicteric Neck:     No masses; no thyromegaly Lungs:    Clear to auscultation bilaterally; normal respiratory effort CV:         Regular rate and rhythm; no murmurs Abd:      + bowel sounds; soft, non-tender; no palpable masses, no distension Ext:    No edema; adequate  peripheral perfusion Skin:      Warm and dry; no rash Neuro: alert and oriented x 3 Psych: normal mood and affect  Data Reviewed: Imaging: High-resolution CT 12/24/2018-mild emphysema, traction bronchiectasis, patchy groundglass and reticulation.  More prominent in the upper lobes with no basal gradient.  Air-trapping present. High-res CT 09/19/2019-stable to minimally changed interstitial lung disease in alternate pattern I have reviewed the images personally.  PFTs:. 01/06/2019 FVC 3 [66%], FEV1 2.61 [78%], F/F  87, TLC 4.33 [59%], DLCO 22.56 [85%] Moderate restriction with no obstruction.  Diffusion capacity is normal  10/03/2019 FVC 2.90 [64%], FEV1 2.47 [94%], DLCO 21.35 [81%] Moderate restriction  Labs: Hypersensitivity panel 12/28/2018- Negative CTD profile 02/10/2019-negative  Assessment:  Interstitial lung disease CT scan reviewed with alternate pattern of pulmonary fibrosis with upper lobe predominance.  This could be hypersensitivity pneumonitis though he does not have any significant exposures  Lab work including CTD serologies, hypersensitivity panel is negative.  Has remote exposure to asbestos but CT scan is not typical of asbestosis.  I reviewed his latest CT scan and PFTs in detail.  There may be slight worsening but not convincing Discussed for options for further work-up in detail with patient, including wait and watch with monitoring, bronchoscope with BAL, transbronchial biopsies or surgical lung biopsy.  After informed decision making we have elected to monitor Since he is asymptomatic and doing well with regard to his respiratory status we have decided to not work this up at present. If he should develop symptoms of worsening imaging, PFTs then we can reconsider  Plan/Recommendations: - Follow-up full PFTs, 6-minute walk in 6 months   Marshell Garfinkel MD [facility] 10/03/2019, 10:59 AM  CC: Redmond School, MD

## 2019-10-21 ENCOUNTER — Ambulatory Visit
Admission: EM | Admit: 2019-10-21 | Discharge: 2019-10-21 | Disposition: A | Discharge status: Home / Self Care | Payer: Medicare HMO | Attending: Emergency Medicine | Admitting: Emergency Medicine

## 2019-10-21 DIAGNOSIS — H9311 Tinnitus, right ear: Secondary | ICD-10-CM | POA: Diagnosis not present

## 2019-10-21 MED ORDER — DEXAMETHASONE SODIUM PHOSPHATE 10 MG/ML IJ SOLN
10.0000 mg | Freq: Once | INTRAMUSCULAR | Status: AC
  Administered 2019-10-21: 10 mg via INTRAMUSCULAR

## 2019-10-21 NOTE — ED Provider Notes (Signed)
[place]   [id number] 10/21/19 Arrival Time: 1326  CC Hearing changes  SUBJECTIVE: History from: patient.  Alexander Nunez is a 74 y.o. male who presents with of RT ear hearing changes for the past week.  Denies a precipitating event, or trauma.  Patient states the pain is intermittent and "whistling" in character.  States it has gotten more constant over time.  Patient has tried anxiety medication without relief.  Symptoms are made worse at night.  Denies similar symptoms in the past.    Denies fever, chills, fatigue, sinus pain, rhinorrhea, ear discharge, ear pain, dizziness, sore throat, SOB, wheezing, chest pain, nausea, changes in bowel or bladder habits.    ROS: As per HPI.  All other pertinent ROS negative.     Past Medical History:  Diagnosis Date  . Anxiety   . Arthritis    back  . Back pain, chronic   . Colon polyp   . Diverticulitis   . GERD (gastroesophageal reflux disease)   . Heart palpitations   . High cholesterol   . Hypertension   . Irritable bowel syndrome   . Osteoporosis   . Psoriasis   . Sleep apnea    c- pap   Past Surgical History:  Procedure Laterality Date  . BACK SURGERY  2015  . CHOLECYSTECTOMY N/A 10/12/2015   Procedure: LAPAROSCOPIC CHOLECYSTECTOMY;  Surgeon: Aviva Signs, MD;  Location: [facility];  Service: General;  Laterality: N/A;  . COLONOSCOPY    . FOOT FRACTURE SURGERY     left foot  . KNEE ARTHROSCOPY     left  . TONSILLECTOMY    . UPPER GASTROINTESTINAL ENDOSCOPY     Allergies  Allergen Reactions  . Cephalexin Other (See Comments)    fever  . Contrast Media [Iodinated Diagnostic Agents] Hives and Rash    Had persistent rash with only 50mg  benadryl prior to spinal injection on 09/03/2017, try 13 hr prep in future.   No current facility-administered medications on file prior to encounter.   Current Outpatient Medications on File Prior to Encounter  Medication Sig Dispense Refill  . albuterol (VENTOLIN HFA) 108 (90 Base)  MCG/ACT inhaler Inhale into the lungs as needed.    . ALPRAZolam (XANAX) 0.5 MG tablet Take 0.25-0.5 mg by mouth at bedtime as needed for sleep.     Marland Kitchen aspirin EC 81 MG tablet Take 81 mg by mouth daily.    . Bacillus Coagulans-Inulin (PROBIOTIC FORMULA PO) Take 1 capsule by mouth every morning.    Marland Kitchen doxazosin (CARDURA) 4 MG tablet Take 4 mg by mouth at bedtime.      . fluticasone (FLONASE) 50 MCG/ACT nasal spray Place 1 spray into both nostrils daily. 16 g 2  . lisinopril (PRINIVIL,ZESTRIL) 10 MG tablet Take 10 mg by mouth every evening.     . metoprolol tartrate (LOPRESSOR) 25 MG tablet Take 1 tablet (25 mg total) by mouth 2 (two) times daily. 60 tablet 0  . pantoprazole (PROTONIX) 40 MG tablet Take 40 mg by mouth daily.    . pravastatin (PRAVACHOL) 20 MG tablet Take 20 mg by mouth every evening.     . Simethicone (GAS-X EXTRA STRENGTH) 125 MG CAPS Take 250 mg by mouth daily as needed (for flatulence).     Social History   Socioeconomic History  . Marital status: Married    Spouse name: Not on file  . Number of children: 2  . Years of education: Not on file  . Highest education  level: Not on file  Occupational History  . Occupation: retired    Comment: Retired  Tobacco Use  . Smoking status: Former Smoker    Packs/day: 2.00    Types: Cigarettes    Start date: 05/22/1962    Quit date: 05/21/1973    Years since quitting: 46.4  . Smokeless tobacco: Never Used  . Tobacco comment: Quit 25 yrs now   Vaping Use  . Vaping Use: Never used  Substance and Sexual Activity  . Alcohol use: Yes    Alcohol/week: 14.0 standard drinks    Types: 14 Shots of liquor per week    Comment: social  . Drug use: No  . Sexual activity: Yes    Partners: Female  Other Topics Concern  . Not on file  Social History Narrative   Has dog named Rockydooal   Social Determinants of Health   Financial Resource Strain:   . Difficulty of Paying Living Expenses: Not on file  Food Insecurity:   . Worried  About Charity fundraiser in the Last Year: Not on file  . Ran Out of Food in the Last Year: Not on file  Transportation Needs:   . Lack of Transportation (Medical): Not on file  . Lack of Transportation (Non-Medical): Not on file  Physical Activity:   . Days of Exercise per Week: Not on file  . Minutes of Exercise per Session: Not on file  Stress:   . Feeling of Stress : Not on file  Social Connections:   . Frequency of Communication with Friends and Family: Not on file  . Frequency of Social Gatherings with Friends and Family: Not on file  . Attends Religious Services: Not on file  . Active Member of Clubs or Organizations: Not on file  . Attends Archivist Meetings: Not on file  . Marital Status: Not on file  Intimate Partner Violence:   . Fear of Current or Ex-Partner: Not on file  . Emotionally Abused: Not on file  . Physically Abused: Not on file  . Sexually Abused: Not on file   Family History  Problem Relation Age of Onset  . Arrhythmia Mother        Atrial fib  . Arrhythmia Sister        Atrial fib  . Stroke Sister   . Breast cancer Sister   . Bladder Cancer Father           . Colon cancer Neg Hx   . Stomach cancer Neg Hx   . Esophageal cancer Neg Hx   . Pancreatic cancer Neg Hx   . Prostate cancer Neg Hx   . Rectal cancer Neg Hx     OBJECTIVE:  Vitals:   10/21/19 1335  BP: 133/65  Pulse: (!) 59  Resp: 20  Temp: 98 F (36.7 C)  SpO2: 98%     General appearance: alert; well-appearing, nontoxic; speaking in full sentences and tolerating own secretions HEENT: NCAT; Ears: EACs clear, TMs pearly gray; Eyes: PERRL.  EOM grossly intact.Nose: nares patent without rhinorrhea, Throat: oropharynx clear, tonsils non erythematous or enlarged, uvula midline  Neck: supple without LAD Lungs: unlabored respirations, symmetrical air entry; cough: absent; no respiratory distress; CTAB Heart: regular rate and rhythm.  Skin: warm and dry Psychological: alert  and cooperative; normal mood and affect  No results found.   ASSESSMENT & PLAN:  1. Tinnitus aurium, right     Meds ordered this encounter  Medications  . dexamethasone (DECADRON) injection  10 mg    Rest and drink plenty of fluids Steroid shot given in office Follow up with PCP if symptoms persists Return here or go to the ER if you have any new or worsening symptoms fever, chills, nausea, vomiting, ear pain, discharge, etc...  Reviewed expectations re: course of current medical issues. Questions answered. Outlined signs and symptoms indicating need for more acute intervention. Patient verbalized understanding. After Visit Summary given.         Lestine Box, PA-C 10/21/19 1358

## 2019-10-21 NOTE — ED Triage Notes (Signed)
Pt presents with ringing in right ear for past week, denies pain

## 2019-10-21 NOTE — Discharge Instructions (Addendum)
Rest and drink plenty of fluids Steroid shot given in office Follow up with PCP if symptoms persists Return here or go to the ER if you have any new or worsening symptoms fever, chills, nausea, vomiting, ear pain, discharge, etc..Marland Kitchen

## 2019-10-23 ENCOUNTER — Other Ambulatory Visit: Payer: Self-pay

## 2019-10-23 ENCOUNTER — Ambulatory Visit
Admission: EM | Admit: 2019-10-23 | Discharge: 2019-10-23 | Disposition: A | Discharge status: Home / Self Care | Payer: Medicare HMO | Attending: Emergency Medicine | Admitting: Emergency Medicine

## 2019-10-23 ENCOUNTER — Other Ambulatory Visit: Payer: Self-pay | Admitting: Pulmonary Disease

## 2019-10-23 DIAGNOSIS — L299 Pruritus, unspecified: Secondary | ICD-10-CM

## 2019-10-23 DIAGNOSIS — R21 Rash and other nonspecific skin eruption: Secondary | ICD-10-CM

## 2019-10-23 MED ORDER — PREDNISONE 20 MG PO TABS: 20.0000 mg | ORAL_TABLET | Freq: Two times a day (BID) | ORAL | 0 refills | Status: AC

## 2019-10-23 MED ORDER — TRIAMCINOLONE ACETONIDE 0.1 % EX CREA: 1.0000 "application " | TOPICAL_CREAM | Freq: Two times a day (BID) | CUTANEOUS | 0 refills | Status: DC

## 2019-10-23 NOTE — Discharge Instructions (Signed)
Wash with warm water and mild soap Prednisone prescribed.  Take as directed and to completion Triamcinolone cream prescribed.  Use as directed.  Do not use in groin Use OTC zyrtec, allegra, or claritin during the day.  Benadryl at night. You may also use OTC hydrocortisone cream and/or calamine lotion to help alleviate itching Follow up with PCP if symptoms persists  Return or go to the ED if you have any new or worsening symptoms such as fever, chills, nausea, vomiting, difficulty breathing, throat swelling, tongue swelling, numbness/ tingling in mouth, worsening symptoms despite treatment, etc..Marland Kitchen

## 2019-10-23 NOTE — ED Triage Notes (Signed)
Pt presents with rash on arms and grown that developed yesterday

## 2019-10-23 NOTE — ED Provider Notes (Signed)
[place]   [id number] 10/23/19 Arrival Time: 1222  CC: Rash and itching  SUBJECTIVE:  Alexander Nunez is a 74 y.o. male who presents with a rash x 1 day.  Denies precipitating event or trauma.  Seen 2 days ago for tinnitus and treated with a decadron shot.  Localizes the rash to bilateral UEs and groin.  Describes it as red and itchy.  Reminds him of poison oak rash.  Also reports hx of psoriasis with similar rash in the past.  Reports improvement with prednisone.  Has tried OTC benadryl without relief.  Symptoms are made worse with scratching.  Denies fever, chills, nausea, vomiting,  SOB, chest pain.  ROS: As per HPI.  All other pertinent ROS negative.     Past Medical History:  Diagnosis Date  . Anxiety   . Arthritis    back  . Back pain, chronic   . Colon polyp   . Diverticulitis   . GERD (gastroesophageal reflux disease)   . Heart palpitations   . High cholesterol   . Hypertension   . Irritable bowel syndrome   . Osteoporosis   . Psoriasis   . Sleep apnea    c- pap   Past Surgical History:  Procedure Laterality Date  . BACK SURGERY  2015  . CHOLECYSTECTOMY N/A 10/12/2015   Procedure: LAPAROSCOPIC CHOLECYSTECTOMY;  Surgeon: Aviva Signs, MD;  Location: [facility];  Service: General;  Laterality: N/A;  . COLONOSCOPY    . FOOT FRACTURE SURGERY     left foot  . KNEE ARTHROSCOPY     left  . TONSILLECTOMY    . UPPER GASTROINTESTINAL ENDOSCOPY     Allergies  Allergen Reactions  . Cephalexin Other (See Comments)    fever  . Contrast Media [Iodinated Diagnostic Agents] Hives and Rash    Had persistent rash with only 50mg  benadryl prior to spinal injection on 09/03/2017, try 13 hr prep in future.   No current facility-administered medications on file prior to encounter.   Current Outpatient Medications on File Prior to Encounter  Medication Sig Dispense Refill  . albuterol (VENTOLIN HFA) 108 (90 Base) MCG/ACT inhaler Inhale into the lungs as needed.    .  ALPRAZolam (XANAX) 0.5 MG tablet Take 0.25-0.5 mg by mouth at bedtime as needed for sleep.     Marland Kitchen aspirin EC 81 MG tablet Take 81 mg by mouth daily.    . Bacillus Coagulans-Inulin (PROBIOTIC FORMULA PO) Take 1 capsule by mouth every morning.    Marland Kitchen doxazosin (CARDURA) 4 MG tablet Take 4 mg by mouth at bedtime.      . fluticasone (FLONASE) 50 MCG/ACT nasal spray Place 1 spray into both nostrils daily. 16 g 2  . lisinopril (PRINIVIL,ZESTRIL) 10 MG tablet Take 10 mg by mouth every evening.     . metoprolol tartrate (LOPRESSOR) 25 MG tablet Take 1 tablet (25 mg total) by mouth 2 (two) times daily. 60 tablet 0  . pantoprazole (PROTONIX) 40 MG tablet Take 40 mg by mouth daily.    . pravastatin (PRAVACHOL) 20 MG tablet Take 20 mg by mouth every evening.     . Simethicone (GAS-X EXTRA STRENGTH) 125 MG CAPS Take 250 mg by mouth daily as needed (for flatulence).     Social History   Socioeconomic History  . Marital status: Married    Spouse name: Not on file  . Number of children: 2  . Years of education: Not on file  . Highest education level: Not  on file  Occupational History  . Occupation: retired    Comment: Retired  Tobacco Use  . Smoking status: Former Smoker    Packs/day: 2.00    Types: Cigarettes    Start date: 05/22/1962    Quit date: 05/21/1973    Years since quitting: 46.4  . Smokeless tobacco: Never Used  . Tobacco comment: Quit 25 yrs now   Vaping Use  . Vaping Use: Never used  Substance and Sexual Activity  . Alcohol use: Yes    Alcohol/week: 14.0 standard drinks    Types: 14 Shots of liquor per week    Comment: social  . Drug use: No  . Sexual activity: Yes    Partners: Female  Other Topics Concern  . Not on file  Social History Narrative   Has dog named Rockydooal   Social Determinants of Health   Financial Resource Strain:   . Difficulty of Paying Living Expenses: Not on file  Food Insecurity:   . Worried About Charity fundraiser in the Last Year: Not on file  .  Ran Out of Food in the Last Year: Not on file  Transportation Needs:   . Lack of Transportation (Medical): Not on file  . Lack of Transportation (Non-Medical): Not on file  Physical Activity:   . Days of Exercise per Week: Not on file  . Minutes of Exercise per Session: Not on file  Stress:   . Feeling of Stress : Not on file  Social Connections:   . Frequency of Communication with Friends and Family: Not on file  . Frequency of Social Gatherings with Friends and Family: Not on file  . Attends Religious Services: Not on file  . Active Member of Clubs or Organizations: Not on file  . Attends Archivist Meetings: Not on file  . Marital Status: Not on file  Intimate Partner Violence:   . Fear of Current or Ex-Partner: Not on file  . Emotionally Abused: Not on file  . Physically Abused: Not on file  . Sexually Abused: Not on file   Family History  Problem Relation Age of Onset  . Arrhythmia Mother        Atrial fib  . Arrhythmia Sister        Atrial fib  . Stroke Sister   . Breast cancer Sister   . Bladder Cancer Father           . Colon cancer Neg Hx   . Stomach cancer Neg Hx   . Esophageal cancer Neg Hx   . Pancreatic cancer Neg Hx   . Prostate cancer Neg Hx   . Rectal cancer Neg Hx     OBJECTIVE: Vitals:   10/23/19 1313  BP: 125/76  Pulse: (!) 56  Temp: 97.8 F (36.6 C)  SpO2: 98%    General appearance: alert; no distress Head: NCAT Lungs: Normal respiratory effort Skin: warm and dry; areas of erythematous papules to bilateral UEs Psychological: alert and cooperative; normal mood and affect  ASSESSMENT & PLAN:  1. Rash and nonspecific skin eruption   2. Itching     Meds ordered this encounter  Medications  . predniSONE (DELTASONE) 20 MG tablet    Sig: Take 1 tablet (20 mg total) by mouth 2 (two) times daily with a meal for 5 days.    Dispense:  10 tablet    Refill:  0    Order Specific Question:   Supervising Provider    Answer:  Blanchie Serve SUE [[id number]]  . triamcinolone cream (KENALOG) 0.1 %    Sig: Apply 1 application topically 2 (two) times daily.    Dispense:  30 g    Refill:  0    Order Specific Question:   Supervising Provider    Answer:   Raylene Everts [[id number]]   Wash with warm water and mild soap Prednisone prescribed.  Take as directed and to completion Triamcinolone cream prescribed.  Use as directed.  Do not use in groin Use OTC zyrtec, allegra, or claritin during the day.  Benadryl at night. You may also use OTC hydrocortisone cream and/or calamine lotion to help alleviate itching Follow up with PCP if symptoms persists  Return or go to the ED if you have any new or worsening symptoms such as fever, chills, nausea, vomiting, difficulty breathing, throat swelling, tongue swelling, numbness/ tingling in mouth, worsening symptoms despite treatment, etc...   Reviewed expectations re: course of current medical issues. Questions answered. Outlined signs and symptoms indicating need for more acute intervention. Patient verbalized understanding. After Visit Summary given.   Lestine Box, PA-C 10/23/19 1330

## 2019-11-03 DIAGNOSIS — I10 Essential (primary) hypertension: Secondary | ICD-10-CM | POA: Diagnosis not present

## 2019-11-03 DIAGNOSIS — E7849 Other hyperlipidemia: Secondary | ICD-10-CM | POA: Diagnosis not present

## 2019-11-03 DIAGNOSIS — R69 Illness, unspecified: Secondary | ICD-10-CM | POA: Diagnosis not present

## 2019-11-03 DIAGNOSIS — G894 Chronic pain syndrome: Secondary | ICD-10-CM | POA: Diagnosis not present

## 2019-11-14 DIAGNOSIS — K589 Irritable bowel syndrome without diarrhea: Secondary | ICD-10-CM | POA: Diagnosis not present

## 2019-11-14 DIAGNOSIS — E785 Hyperlipidemia, unspecified: Secondary | ICD-10-CM | POA: Diagnosis not present

## 2019-11-14 DIAGNOSIS — R69 Illness, unspecified: Secondary | ICD-10-CM | POA: Diagnosis not present

## 2019-11-14 DIAGNOSIS — E042 Nontoxic multinodular goiter: Secondary | ICD-10-CM | POA: Diagnosis not present

## 2019-11-14 DIAGNOSIS — J8410 Pulmonary fibrosis, unspecified: Secondary | ICD-10-CM | POA: Diagnosis not present

## 2019-11-14 DIAGNOSIS — I77819 Aortic ectasia, unspecified site: Secondary | ICD-10-CM | POA: Diagnosis not present

## 2019-11-14 DIAGNOSIS — E669 Obesity, unspecified: Secondary | ICD-10-CM | POA: Diagnosis not present

## 2019-11-14 DIAGNOSIS — K219 Gastro-esophageal reflux disease without esophagitis: Secondary | ICD-10-CM | POA: Diagnosis not present

## 2019-11-14 DIAGNOSIS — K760 Fatty (change of) liver, not elsewhere classified: Secondary | ICD-10-CM | POA: Diagnosis not present

## 2019-11-14 DIAGNOSIS — I10 Essential (primary) hypertension: Secondary | ICD-10-CM | POA: Diagnosis not present

## 2019-11-17 DIAGNOSIS — Z23 Encounter for immunization: Secondary | ICD-10-CM | POA: Diagnosis not present

## 2019-11-23 DIAGNOSIS — Z6841 Body Mass Index (BMI) 40.0 and over, adult: Secondary | ICD-10-CM | POA: Diagnosis not present

## 2019-11-23 DIAGNOSIS — Z1331 Encounter for screening for depression: Secondary | ICD-10-CM | POA: Diagnosis not present

## 2019-11-23 DIAGNOSIS — R69 Illness, unspecified: Secondary | ICD-10-CM | POA: Diagnosis not present

## 2019-11-23 DIAGNOSIS — Z0001 Encounter for general adult medical examination with abnormal findings: Secondary | ICD-10-CM | POA: Diagnosis not present

## 2019-11-23 DIAGNOSIS — E039 Hypothyroidism, unspecified: Secondary | ICD-10-CM | POA: Diagnosis not present

## 2019-11-23 DIAGNOSIS — H9311 Tinnitus, right ear: Secondary | ICD-10-CM | POA: Diagnosis not present

## 2019-11-23 DIAGNOSIS — E7849 Other hyperlipidemia: Secondary | ICD-10-CM | POA: Diagnosis not present

## 2019-11-23 DIAGNOSIS — Z1389 Encounter for screening for other disorder: Secondary | ICD-10-CM | POA: Diagnosis not present

## 2019-11-23 DIAGNOSIS — Z125 Encounter for screening for malignant neoplasm of prostate: Secondary | ICD-10-CM | POA: Diagnosis not present

## 2019-12-03 DIAGNOSIS — R69 Illness, unspecified: Secondary | ICD-10-CM | POA: Diagnosis not present

## 2019-12-03 DIAGNOSIS — G894 Chronic pain syndrome: Secondary | ICD-10-CM | POA: Diagnosis not present

## 2019-12-03 DIAGNOSIS — E7849 Other hyperlipidemia: Secondary | ICD-10-CM | POA: Diagnosis not present

## 2019-12-03 DIAGNOSIS — I10 Essential (primary) hypertension: Secondary | ICD-10-CM | POA: Diagnosis not present

## 2019-12-12 DIAGNOSIS — N4340 Spermatocele of epididymis, unspecified: Secondary | ICD-10-CM | POA: Diagnosis not present

## 2019-12-12 DIAGNOSIS — N411 Chronic prostatitis: Secondary | ICD-10-CM | POA: Diagnosis not present

## 2019-12-22 DIAGNOSIS — Z6841 Body Mass Index (BMI) 40.0 and over, adult: Secondary | ICD-10-CM | POA: Diagnosis not present

## 2019-12-22 DIAGNOSIS — J849 Interstitial pulmonary disease, unspecified: Secondary | ICD-10-CM | POA: Diagnosis not present

## 2019-12-22 DIAGNOSIS — J449 Chronic obstructive pulmonary disease, unspecified: Secondary | ICD-10-CM | POA: Diagnosis not present

## 2019-12-22 DIAGNOSIS — R69 Illness, unspecified: Secondary | ICD-10-CM | POA: Diagnosis not present

## 2019-12-22 DIAGNOSIS — I10 Essential (primary) hypertension: Secondary | ICD-10-CM | POA: Diagnosis not present

## 2019-12-22 DIAGNOSIS — J439 Emphysema, unspecified: Secondary | ICD-10-CM | POA: Diagnosis not present

## 2019-12-22 DIAGNOSIS — G894 Chronic pain syndrome: Secondary | ICD-10-CM | POA: Diagnosis not present

## 2019-12-30 DIAGNOSIS — Z03818 Encounter for observation for suspected exposure to other biological agents ruled out: Secondary | ICD-10-CM | POA: Diagnosis not present

## 2019-12-30 DIAGNOSIS — Z20822 Contact with and (suspected) exposure to covid-19: Secondary | ICD-10-CM | POA: Diagnosis not present

## 2020-01-03 DIAGNOSIS — G894 Chronic pain syndrome: Secondary | ICD-10-CM | POA: Diagnosis not present

## 2020-01-03 DIAGNOSIS — R69 Illness, unspecified: Secondary | ICD-10-CM | POA: Diagnosis not present

## 2020-01-03 DIAGNOSIS — I10 Essential (primary) hypertension: Secondary | ICD-10-CM | POA: Diagnosis not present

## 2020-01-03 DIAGNOSIS — E7849 Other hyperlipidemia: Secondary | ICD-10-CM | POA: Diagnosis not present

## 2020-01-05 ENCOUNTER — Telehealth: Payer: Self-pay | Admitting: Pulmonary Disease

## 2020-01-05 NOTE — Telephone Encounter (Signed)
I called and spoke with the patient. His symptoms started on 11/18 which is within the 10 day window. I have made a referral to [facility] [facility].

## 2020-01-05 NOTE — Telephone Encounter (Signed)
Noted. Nothing further needed at this time.  

## 2020-01-05 NOTE — Telephone Encounter (Signed)
Called and spoke with patient who states that he tested positive for Covid on Monday 01/02/20. States that she feels ok overall but has more congestion today than he has been having. Patient would like to know if he would be a good candidate for anibody treatments.     Dr. Vaughan Browner please advise

## 2020-01-06 ENCOUNTER — Other Ambulatory Visit: Payer: Self-pay | Admitting: Physician Assistant

## 2020-01-06 DIAGNOSIS — U071 COVID-19: Secondary | ICD-10-CM

## 2020-01-06 DIAGNOSIS — I10 Essential (primary) hypertension: Secondary | ICD-10-CM

## 2020-01-06 DIAGNOSIS — J849 Interstitial pulmonary disease, unspecified: Secondary | ICD-10-CM

## 2020-01-06 DIAGNOSIS — E663 Overweight: Secondary | ICD-10-CM

## 2020-01-06 NOTE — Progress Notes (Signed)
I connected by phone with Tsosie Billing on 01/06/2020 at 12:45 PM to discuss the potential use of a new treatment for mild to moderate COVID-19 viral infection in non-hospitalized patients.  This patient is a 74 y.o. male that meets the FDA criteria for Emergency Use Authorization of COVID monoclonal antibody sotrovimab, casirivimab/imdevimab or bamlamivimab/estevimab.  Has a (+) direct SARS-CoV-2 viral test result  Has mild or moderate COVID-19   Is NOT hospitalized due to COVID-19  Is within 10 days of symptom onset  Has at least one of the high risk factor(s) for progression to severe COVID-19 and/or hospitalization as defined in EUA.  Specific high risk criteria : Older age (>/= 73 yo), BMI > 25, Cardiovascular disease or hypertension and Chronic Lung Disease   I have spoken and communicated the following to the patient or parent/caregiver regarding COVID monoclonal antibody treatment:  1. FDA has authorized the emergency use for the treatment of mild to moderate COVID-19 in adults and pediatric patients with positive results of direct SARS-CoV-2 viral testing who are 74 years of age and older weighing at least 40 kg, and who are at high risk for progressing to severe COVID-19 and/or hospitalization.  2. The significant known and potential risks and benefits of COVID monoclonal antibody, and the extent to which such potential risks and benefits are unknown.  3. Information on available alternative treatments and the risks and benefits of those alternatives, including clinical trials.  4. Patients treated with COVID monoclonal antibody should continue to self-isolate and use infection control measures (e.g., wear mask, isolate, social distance, avoid sharing personal items, clean and disinfect "high touch" surfaces, and frequent handwashing) according to CDC guidelines.   5. The patient or parent/caregiver has the option to accept or refuse COVID monoclonal antibody treatment.  After  reviewing this information with the patient, the patient has agreed to receive one of the available covid 19 monoclonal antibodies and will be provided an appropriate fact sheet prior to infusion.  Sx onset 11/25. Set up for infusion on 12/4 @ 12:30pm. Directions given to [facility]. Pt is aware that insurance will be charged an infusion fee. Pt is fully vaccinated. Will bring in copy of + test from pharmacy.  Angelena Form 01/06/2020 12:45 PM

## 2020-01-07 ENCOUNTER — Ambulatory Visit (HOSPITAL_COMMUNITY)
Admission: RE | Admit: 2020-01-07 | Discharge: 2020-01-07 | Disposition: A | Discharge status: Home / Self Care | Payer: Medicare Other | Source: Ambulatory Visit | Attending: Pulmonary Disease | Admitting: Pulmonary Disease

## 2020-01-07 DIAGNOSIS — J849 Interstitial pulmonary disease, unspecified: Secondary | ICD-10-CM | POA: Insufficient documentation

## 2020-01-07 DIAGNOSIS — U071 COVID-19: Secondary | ICD-10-CM | POA: Diagnosis present

## 2020-01-07 DIAGNOSIS — E663 Overweight: Secondary | ICD-10-CM | POA: Diagnosis present

## 2020-01-07 DIAGNOSIS — I10 Essential (primary) hypertension: Secondary | ICD-10-CM | POA: Diagnosis present

## 2020-01-07 DIAGNOSIS — Z23 Encounter for immunization: Secondary | ICD-10-CM | POA: Diagnosis not present

## 2020-01-07 MED ORDER — DIPHENHYDRAMINE HCL 50 MG/ML IJ SOLN: 50.0000 mg | Freq: Once | INTRAMUSCULAR | Status: DC | PRN

## 2020-01-07 MED ORDER — FAMOTIDINE IN NACL 20-0.9 MG/50ML-% IV SOLN: 20.0000 mg | Freq: Once | INTRAVENOUS | Status: DC | PRN

## 2020-01-07 MED ORDER — EPINEPHRINE 0.3 MG/0.3ML IJ SOAJ: 0.3000 mg | Freq: Once | INTRAMUSCULAR | Status: DC | PRN

## 2020-01-07 MED ORDER — SODIUM CHLORIDE 0.9 % IV SOLN: INTRAVENOUS | Status: DC | PRN

## 2020-01-07 MED ORDER — METHYLPREDNISOLONE SODIUM SUCC 125 MG IJ SOLR: 125.0000 mg | Freq: Once | INTRAMUSCULAR | Status: DC | PRN

## 2020-01-07 MED ORDER — ALBUTEROL SULFATE HFA 108 (90 BASE) MCG/ACT IN AERS: 2.0000 | INHALATION_SPRAY | Freq: Once | RESPIRATORY_TRACT | Status: DC | PRN

## 2020-01-07 MED ORDER — SODIUM CHLORIDE 0.9 % IV SOLN: Freq: Once | INTRAVENOUS | Status: AC

## 2020-01-07 NOTE — Discharge Instructions (Signed)
10 Things You Can Do to Manage Your COVID-19 Symptoms at Home If you have possible or confirmed COVID-19: 1. Stay home from work and school. And stay away from other public places. If you must go out, avoid using any kind of public transportation, ridesharing, or taxis. 2. Monitor your symptoms carefully. If your symptoms get worse, call your healthcare provider immediately. 3. Get rest and stay hydrated. 4. If you have a medical appointment, call the healthcare provider ahead of time and tell them that you have or may have COVID-19. 5. For medical emergencies, call 911 and notify the dispatch personnel that you have or may have COVID-19. 6. Cover your cough and sneezes with a tissue or use the inside of your elbow. 7. Wash your hands often with soap and water for at least 20 seconds or clean your hands with an alcohol-based hand sanitizer that contains at least 60% alcohol. 8. As much as possible, stay in a specific room and away from other people in your home. Also, you should use a separate bathroom, if available. If you need to be around other people in or outside of the home, wear a mask. 9. Avoid sharing personal items with other people in your household, like dishes, towels, and bedding. 10. Clean all surfaces that are touched often, like counters, tabletops, and doorknobs. Use household cleaning sprays or wipes according to the label instructions. cdc.gov/coronavirus 08/04/2018 This information is not intended to replace advice given to you by your health care provider. Make sure you discuss any questions you have with your health care provider. Document Revised: 01/06/2019 Document Reviewed: 01/06/2019 Elsevier Patient Education  2020 Elsevier Inc. What types of side effects do monoclonal antibody drugs cause?  Common side effects  In general, the more common side effects caused by monoclonal antibody drugs include: . Allergic reactions, such as hives or itching . Flu-like signs and  symptoms, including chills, fatigue, fever, and muscle aches and pains . Nausea, vomiting . Diarrhea . Skin rashes . Low blood pressure   The CDC is recommending patients who receive monoclonal antibody treatments wait at least 90 days before being vaccinated.  Currently, there are no data on the safety and efficacy of mRNA COVID-19 vaccines in persons who received monoclonal antibodies or convalescent plasma as part of COVID-19 treatment. Based on the estimated half-life of such therapies as well as evidence suggesting that reinfection is uncommon in the 90 days after initial infection, vaccination should be deferred for at least 90 days, as a precautionary measure until additional information becomes available, to avoid interference of the antibody treatment with vaccine-induced immune responses. If you have any questions or concerns after the infusion please call the Advanced Practice Provider on call at [phone]. This number is ONLY intended for your use regarding questions or concerns about the infusion post-treatment side-effects.  Please do not provide this number to others for use. For return to work notes please contact your primary care provider.   If someone you know is interested in receiving treatment please have them call the COVID hotline at [phone].   

## 2020-01-07 NOTE — Progress Notes (Addendum)
Patient reviewed Fact Sheet for Patients, Parents, and Caregivers for Emergency Use Authorization (EUA) of Regen-Cov for the Treatment of Coronavirus.  Patient also reviewed and is agreeable to the estimated cost of treatment.  Patient is agreeable to proceed.   

## 2020-01-07 NOTE — Progress Notes (Signed)
  Diagnosis: COVID-19  Physician: Dr Joya Gaskins  Procedure: Covid Infusion Clinic Med: casirivimab\imdevimab infusion - Provided patient with casirivimab\imdevimab fact sheet for patients, parents and caregivers prior to infusion.  Complications: No immediate complications noted.  Discharge: Discharged home   [name] 01/07/2020

## 2020-01-09 ENCOUNTER — Other Ambulatory Visit (HOSPITAL_COMMUNITY): Payer: Self-pay

## 2020-01-18 ENCOUNTER — Other Ambulatory Visit: Payer: Self-pay | Admitting: Pulmonary Disease

## 2020-02-03 DIAGNOSIS — E7849 Other hyperlipidemia: Secondary | ICD-10-CM | POA: Diagnosis not present

## 2020-02-03 DIAGNOSIS — R69 Illness, unspecified: Secondary | ICD-10-CM | POA: Diagnosis not present

## 2020-02-03 DIAGNOSIS — G894 Chronic pain syndrome: Secondary | ICD-10-CM | POA: Diagnosis not present

## 2020-02-03 DIAGNOSIS — I10 Essential (primary) hypertension: Secondary | ICD-10-CM | POA: Diagnosis not present

## 2020-03-05 DIAGNOSIS — G894 Chronic pain syndrome: Secondary | ICD-10-CM | POA: Diagnosis not present

## 2020-03-05 DIAGNOSIS — E7849 Other hyperlipidemia: Secondary | ICD-10-CM | POA: Diagnosis not present

## 2020-03-05 DIAGNOSIS — R69 Illness, unspecified: Secondary | ICD-10-CM | POA: Diagnosis not present

## 2020-03-05 DIAGNOSIS — I10 Essential (primary) hypertension: Secondary | ICD-10-CM | POA: Diagnosis not present

## 2020-03-30 DIAGNOSIS — Z6841 Body Mass Index (BMI) 40.0 and over, adult: Secondary | ICD-10-CM | POA: Diagnosis not present

## 2020-03-30 DIAGNOSIS — M5410 Radiculopathy, site unspecified: Secondary | ICD-10-CM | POA: Diagnosis not present

## 2020-03-30 DIAGNOSIS — Z1331 Encounter for screening for depression: Secondary | ICD-10-CM | POA: Diagnosis not present

## 2020-04-11 DIAGNOSIS — M5410 Radiculopathy, site unspecified: Secondary | ICD-10-CM | POA: Diagnosis not present

## 2020-04-11 DIAGNOSIS — M5136 Other intervertebral disc degeneration, lumbar region: Secondary | ICD-10-CM | POA: Diagnosis not present

## 2020-04-11 DIAGNOSIS — Z6841 Body Mass Index (BMI) 40.0 and over, adult: Secondary | ICD-10-CM | POA: Diagnosis not present

## 2020-04-12 DIAGNOSIS — H5201 Hypermetropia, right eye: Secondary | ICD-10-CM | POA: Diagnosis not present

## 2020-04-12 DIAGNOSIS — H2513 Age-related nuclear cataract, bilateral: Secondary | ICD-10-CM | POA: Diagnosis not present

## 2020-04-12 DIAGNOSIS — H5212 Myopia, left eye: Secondary | ICD-10-CM | POA: Diagnosis not present

## 2020-04-12 DIAGNOSIS — H524 Presbyopia: Secondary | ICD-10-CM | POA: Diagnosis not present

## 2020-04-12 DIAGNOSIS — H52203 Unspecified astigmatism, bilateral: Secondary | ICD-10-CM | POA: Diagnosis not present

## 2020-04-27 DIAGNOSIS — M25559 Pain in unspecified hip: Secondary | ICD-10-CM | POA: Diagnosis not present

## 2020-05-01 DIAGNOSIS — M461 Sacroiliitis, not elsewhere classified: Secondary | ICD-10-CM | POA: Diagnosis not present

## 2020-05-01 DIAGNOSIS — M961 Postlaminectomy syndrome, not elsewhere classified: Secondary | ICD-10-CM | POA: Diagnosis not present

## 2020-05-01 DIAGNOSIS — M546 Pain in thoracic spine: Secondary | ICD-10-CM | POA: Diagnosis not present

## 2020-05-01 DIAGNOSIS — Z6841 Body Mass Index (BMI) 40.0 and over, adult: Secondary | ICD-10-CM | POA: Diagnosis not present

## 2020-05-01 DIAGNOSIS — R030 Elevated blood-pressure reading, without diagnosis of hypertension: Secondary | ICD-10-CM | POA: Diagnosis not present

## 2020-05-01 DIAGNOSIS — M5416 Radiculopathy, lumbar region: Secondary | ICD-10-CM | POA: Diagnosis not present

## 2020-05-01 DIAGNOSIS — M7062 Trochanteric bursitis, left hip: Secondary | ICD-10-CM | POA: Diagnosis not present

## 2020-05-01 DIAGNOSIS — M7918 Myalgia, other site: Secondary | ICD-10-CM | POA: Diagnosis not present

## 2020-05-01 DIAGNOSIS — M47816 Spondylosis without myelopathy or radiculopathy, lumbar region: Secondary | ICD-10-CM | POA: Diagnosis not present

## 2020-05-02 DIAGNOSIS — E7849 Other hyperlipidemia: Secondary | ICD-10-CM | POA: Diagnosis not present

## 2020-05-02 DIAGNOSIS — I10 Essential (primary) hypertension: Secondary | ICD-10-CM | POA: Diagnosis not present

## 2020-05-15 DIAGNOSIS — L409 Psoriasis, unspecified: Secondary | ICD-10-CM | POA: Diagnosis not present

## 2020-05-15 DIAGNOSIS — I2584 Coronary atherosclerosis due to calcified coronary lesion: Secondary | ICD-10-CM | POA: Diagnosis not present

## 2020-05-15 DIAGNOSIS — I10 Essential (primary) hypertension: Secondary | ICD-10-CM | POA: Diagnosis not present

## 2020-05-15 DIAGNOSIS — I2510 Atherosclerotic heart disease of native coronary artery without angina pectoris: Secondary | ICD-10-CM | POA: Diagnosis not present

## 2020-05-15 DIAGNOSIS — I700 Atherosclerosis of aorta: Secondary | ICD-10-CM | POA: Diagnosis not present

## 2020-05-15 DIAGNOSIS — E785 Hyperlipidemia, unspecified: Secondary | ICD-10-CM | POA: Diagnosis not present

## 2020-05-15 DIAGNOSIS — E042 Nontoxic multinodular goiter: Secondary | ICD-10-CM | POA: Diagnosis not present

## 2020-05-15 DIAGNOSIS — J8410 Pulmonary fibrosis, unspecified: Secondary | ICD-10-CM | POA: Diagnosis not present

## 2020-05-15 DIAGNOSIS — K760 Fatty (change of) liver, not elsewhere classified: Secondary | ICD-10-CM | POA: Diagnosis not present

## 2020-05-15 DIAGNOSIS — E669 Obesity, unspecified: Secondary | ICD-10-CM | POA: Diagnosis not present

## 2020-07-03 DIAGNOSIS — E7849 Other hyperlipidemia: Secondary | ICD-10-CM | POA: Diagnosis not present

## 2020-07-03 DIAGNOSIS — I10 Essential (primary) hypertension: Secondary | ICD-10-CM | POA: Diagnosis not present

## 2020-07-27 ENCOUNTER — Other Ambulatory Visit: Payer: Self-pay

## 2020-07-27 ENCOUNTER — Ambulatory Visit (HOSPITAL_COMMUNITY)
Admission: RE | Admit: 2020-07-27 | Discharge: 2020-07-27 | Disposition: A | Discharge status: Home / Self Care | Payer: Medicare HMO | Source: Ambulatory Visit | Attending: Family Medicine | Admitting: Family Medicine

## 2020-07-27 ENCOUNTER — Other Ambulatory Visit (HOSPITAL_COMMUNITY): Payer: Self-pay | Admitting: Family Medicine

## 2020-07-27 DIAGNOSIS — M25552 Pain in left hip: Secondary | ICD-10-CM | POA: Diagnosis not present

## 2020-07-27 DIAGNOSIS — Z6841 Body Mass Index (BMI) 40.0 and over, adult: Secondary | ICD-10-CM | POA: Diagnosis not present

## 2020-07-27 DIAGNOSIS — M1991 Primary osteoarthritis, unspecified site: Secondary | ICD-10-CM | POA: Diagnosis not present

## 2020-08-02 DIAGNOSIS — E782 Mixed hyperlipidemia: Secondary | ICD-10-CM | POA: Diagnosis not present

## 2020-08-02 DIAGNOSIS — I10 Essential (primary) hypertension: Secondary | ICD-10-CM | POA: Diagnosis not present

## 2020-08-20 DIAGNOSIS — M7062 Trochanteric bursitis, left hip: Secondary | ICD-10-CM | POA: Diagnosis not present

## 2020-08-20 DIAGNOSIS — M25552 Pain in left hip: Secondary | ICD-10-CM | POA: Diagnosis not present

## 2020-08-27 ENCOUNTER — Ambulatory Visit (HOSPITAL_COMMUNITY)
Admission: RE | Admit: 2020-08-27 | Discharge: 2020-08-27 | Disposition: A | Discharge status: Home / Self Care | Payer: Medicare HMO | Source: Ambulatory Visit | Attending: Pulmonary Disease | Admitting: Pulmonary Disease

## 2020-08-27 DIAGNOSIS — Z20822 Contact with and (suspected) exposure to covid-19: Secondary | ICD-10-CM | POA: Diagnosis not present

## 2020-08-27 LAB — SARS CORONAVIRUS 2 (TAT 6-24 HRS): SARS Coronavirus 2: NEGATIVE

## 2020-08-29 ENCOUNTER — Ambulatory Visit (INDEPENDENT_AMBULATORY_CARE_PROVIDER_SITE_OTHER): Payer: Medicare HMO | Admitting: Pulmonary Disease

## 2020-08-29 ENCOUNTER — Ambulatory Visit: Payer: Medicare Other | Admitting: Pulmonary Disease

## 2020-08-29 ENCOUNTER — Encounter: Payer: Self-pay | Admitting: *Deleted

## 2020-08-29 ENCOUNTER — Other Ambulatory Visit: Payer: Self-pay

## 2020-08-29 VITALS — BP 128/70 | HR 54 | Ht 71.0 in | Wt 283.0 lb

## 2020-08-29 DIAGNOSIS — J849 Interstitial pulmonary disease, unspecified: Secondary | ICD-10-CM

## 2020-08-29 LAB — PULMONARY FUNCTION TEST
DL/VA % pred: 116 %
DL/VA: 4.61 ml/min/mmHg/L
DLCO cor % pred: 75 %
DLCO cor: 19.75 ml/min/mmHg
DLCO unc % pred: 75 %
DLCO unc: 19.75 ml/min/mmHg
FEF 25-75 Post: 3.72 L/sec
FEF 25-75 Pre: 3.58 L/sec
FEF2575-%Change-Post: 4 %
FEF2575-%Pred-Post: 157 %
FEF2575-%Pred-Pre: 150 %
FEV1-%Change-Post: 2 %
FEV1-%Pred-Post: 74 %
FEV1-%Pred-Pre: 72 %
FEV1-Post: 2.42 L
FEV1-Pre: 2.36 L
FEV1FVC-%Change-Post: 0 %
FEV1FVC-%Pred-Pre: 122 %
FEV6-%Change-Post: 4 %
FEV6-%Pred-Post: 65 %
FEV6-%Pred-Pre: 63 %
FEV6-Post: 2.74 L
FEV6-Pre: 2.64 L
FEV6FVC-%Change-Post: 0 %
FEV6FVC-%Pred-Post: 106 %
FEV6FVC-%Pred-Pre: 105 %
FVC-%Change-Post: 3 %
FVC-%Pred-Post: 61 %
FVC-%Pred-Pre: 59 %
FVC-Post: 2.74 L
FVC-Pre: 2.65 L
Post FEV1/FVC ratio: 88 %
Post FEV6/FVC ratio: 100 %
Pre FEV1/FVC ratio: 89 %
Pre FEV6/FVC Ratio: 99 %
RV % pred: 67 %
RV: 1.74 L
TLC % pred: 62 %
TLC: 4.55 L

## 2020-08-29 NOTE — Progress Notes (Signed)
Alexander Nunez    [id number]    03/14/1945  Primary Care Physician:Fusco, Purcell Nails, MD  Referring Physician: Redmond School, MD [address] [place],  [place] [address]  Chief complaint: Follow-up for ILD, pulmonary fibrosis  HPI: 75 year old with history of hypertension, hyperlipidemia, sleep apnea, psoriasis Had an incidental finding of interstitial lung disease on CT abdomen which was done for diverticulitis He has seen Dr. Velvet Bathe at [facility] who did a high resolution CT with findings of alternate diagnosis, possible HP.  He had HP panel on 12/28/2018 which is negative. History notable for psoriasis with skin involvement.  He was on methotrexate for a couple of years and stopped as his skin rash improved.  Last dose of methotrexate was several years ago.  No respiratory complaints at present.  Denies any dyspnea, cough, sputum production  Pets: Has a dog.  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  His hobbies include gardening but no significant exposure to mold,No hot tub, Jacuzzi, no down pillows or comforters.  He uses talcum powder every day.  May have had Freon leak from his HVAC system. Likes to play golf.   Smoking history: 30-pack-year smoker.  Quit in 1980 Travel history: No significant travel history Relevant family history: No significant family history of lung disease.  Interval history: Had COVID-19 infection December 2021, referred for monoclonal antibody  States that he is doing well with regard to the breathing.  No new complaints today. He is here for review of PFTs  Has gained some weight as he is immobile due to hip bursitis.  He is seeing orthopedics and got a steroid injection recently  Outpatient Encounter Medications as of 08/29/2020   Medication Sig   albuterol (VENTOLIN HFA) 108 (90 Base) MCG/ACT inhaler Inhale into the lungs as needed.   ALPRAZolam (XANAX) 0.5 MG tablet Take 0.25-0.5 mg by mouth at bedtime as needed for sleep.    aspirin EC 81 MG tablet Take 81 mg by mouth daily.   doxazosin (CARDURA) 4 MG tablet Take 4 mg by mouth at bedtime.     fluticasone (FLONASE) 50 MCG/ACT nasal spray PLACE 1 SPRAY INTO BOTH NOSTRILS DAILY.   lisinopril (PRINIVIL,ZESTRIL) 10 MG tablet Take 10 mg by mouth every evening.    metoprolol tartrate (LOPRESSOR) 25 MG tablet Take 1 tablet (25 mg total) by mouth 2 (two) times daily.   pantoprazole (PROTONIX) 40 MG tablet Take 40 mg by mouth daily.   pravastatin (PRAVACHOL) 20 MG tablet Take 20 mg by mouth every evening.    triamcinolone cream (KENALOG) 0.1 % Apply 1 application topically 2 (two) times daily.   [DISCONTINUED] Bacillus Coagulans-Inulin (PROBIOTIC FORMULA PO) Take 1 capsule by mouth every morning.   [DISCONTINUED] Simethicone (GAS-X EXTRA STRENGTH) 125 MG CAPS Take 250 mg by mouth daily as needed (for flatulence).   No facility-administered encounter medications on file as of 08/29/2020.   Physical Exam: Blood pressure 128/70, pulse (!) 54, height '5\' 11"'$  (1.803 m), weight 283 lb (128.4 kg), SpO2 98 %. Gen:      No acute distress HEENT:  EOMI, sclera anicteric Neck:     No masses; no thyromegaly Lungs:    Clear to auscultation bilaterally; normal respiratory effort CV:         Regular rate and rhythm;  no murmurs Abd:      + bowel sounds; soft, non-tender; no palpable masses, no distension Ext:    No edema; adequate peripheral perfusion Skin:      Warm and dry; no rash Neuro: alert and oriented x 3 Psych: normal mood and affect   Data Reviewed: Imaging: High-resolution CT 12/24/2018-mild emphysema, traction bronchiectasis, patchy groundglass and reticulation.  More prominent in the upper lobes with no basal gradient.  Air-trapping present. High-res CT 09/19/2019-stable  to minimally changed interstitial lung disease in alternate pattern I have reviewed the images personally.  PFTs:. 01/06/2019 FVC 3 [66%], FEV1 2.61 [78%], F/F 87, TLC 4.33 [59%], DLCO 22.56 [85%] Severe restriction with no obstruction.  Diffusion capacity is normal  10/03/2019 FVC 2.90 [64%], FEV1 2.47 [94%], DLCO 21.35 [81%] Moderate restriction  08/29/2020 FVC 2.14 [1%], FEV1 2.42 [74%], TLC 4.55 [62%], DLCO 19.75 [75%] Moderate restriction, mild diffusion defect  Labs: Hypersensitivity panel 12/28/2018- Negative CTD profile 02/10/2019-negative  Assessment:  Interstitial lung disease CT scan reviewed with alternate pattern of pulmonary fibrosis with upper lobe predominance.  This could be hypersensitivity pneumonitis though he does not have any significant exposures  Lab work including CTD serologies, hypersensitivity panel is negative.  Has remote exposure to asbestos but CT scan is not typical of asbestosis.  I reviewed his latest PFTs in detail.  There may be slight worsening but not convincing Discussed for options for further work-up in detail with patient, including wait and watch with monitoring, bronchoscope with BAL, transbronchial biopsies or surgical lung biopsy.  After informed decision making we have elected to monitor Since he is asymptomatic and doing well with regard to his respiratory status we have decided to not work this up at present. If he should develop symptoms of worsening imaging, PFTs then we can reconsider.  Follow-up high-resolution CT ordered  Obesity He has weight gain recently which is suspect is contributing to his restriction and dyspnea Advised weight loss with diet and exercise  Plan/Recommendations: -Follow-up high-resolution CT at next available   Marshell Garfinkel MD [facility] 08/29/2020, 10:33 AM  CC: Redmond School, MD

## 2020-08-29 NOTE — Progress Notes (Signed)
Full PFT performed today. °

## 2020-08-29 NOTE — Patient Instructions (Signed)
Full PFT performed today. °

## 2020-08-29 NOTE — Patient Instructions (Signed)
We will schedule a high-resolution CT to be done at [facility] for reevaluation of your lung disease Your lung function test look stable  Follow-up in 3 to 4 months

## 2020-08-31 DIAGNOSIS — R300 Dysuria: Secondary | ICD-10-CM | POA: Diagnosis not present

## 2020-08-31 DIAGNOSIS — N410 Acute prostatitis: Secondary | ICD-10-CM | POA: Diagnosis not present

## 2020-08-31 DIAGNOSIS — I10 Essential (primary) hypertension: Secondary | ICD-10-CM | POA: Diagnosis not present

## 2020-08-31 DIAGNOSIS — M7062 Trochanteric bursitis, left hip: Secondary | ICD-10-CM | POA: Diagnosis not present

## 2020-08-31 DIAGNOSIS — G894 Chronic pain syndrome: Secondary | ICD-10-CM | POA: Diagnosis not present

## 2020-08-31 DIAGNOSIS — Z6841 Body Mass Index (BMI) 40.0 and over, adult: Secondary | ICD-10-CM | POA: Diagnosis not present

## 2020-09-02 DIAGNOSIS — E782 Mixed hyperlipidemia: Secondary | ICD-10-CM | POA: Diagnosis not present

## 2020-09-02 DIAGNOSIS — I10 Essential (primary) hypertension: Secondary | ICD-10-CM | POA: Diagnosis not present

## 2020-09-04 ENCOUNTER — Ambulatory Visit (HOSPITAL_COMMUNITY): Payer: Medicare HMO | Admitting: Physical Therapy

## 2020-09-17 ENCOUNTER — Telehealth: Payer: Self-pay | Admitting: Pulmonary Disease

## 2020-09-17 NOTE — Telephone Encounter (Signed)
Called and spoke with pt who stated his wife had tested positive for covid and then he tested positive today 8/15. Pt said that when his wife tested positive for covid, they called PCP and PCP called in paxlovid for both wife and him.  Pt said in November 2021 he tested positive for covid and had the antibody infusion done. Stated to pt that the paxlovid is now a new thing that is being done if you do test positive for covid and pt verbalized understanding. Nothing further needed.

## 2020-09-20 ENCOUNTER — Ambulatory Visit (HOSPITAL_COMMUNITY): Payer: Medicare HMO | Admitting: Physical Therapy

## 2020-09-25 ENCOUNTER — Ambulatory Visit (HOSPITAL_COMMUNITY): Payer: Medicare HMO

## 2020-09-30 ENCOUNTER — Other Ambulatory Visit: Payer: Self-pay

## 2020-09-30 ENCOUNTER — Emergency Department (HOSPITAL_COMMUNITY)
Admission: EM | Admit: 2020-09-30 | Discharge: 2020-09-30 | Disposition: A | Discharge status: Home / Self Care | Payer: Medicare HMO | Attending: Emergency Medicine | Admitting: Emergency Medicine

## 2020-09-30 ENCOUNTER — Encounter (HOSPITAL_COMMUNITY): Payer: Self-pay

## 2020-09-30 DIAGNOSIS — G8929 Other chronic pain: Secondary | ICD-10-CM | POA: Diagnosis not present

## 2020-09-30 DIAGNOSIS — Z7982 Long term (current) use of aspirin: Secondary | ICD-10-CM | POA: Insufficient documentation

## 2020-09-30 DIAGNOSIS — Z79899 Other long term (current) drug therapy: Secondary | ICD-10-CM | POA: Diagnosis not present

## 2020-09-30 DIAGNOSIS — M25552 Pain in left hip: Secondary | ICD-10-CM

## 2020-09-30 DIAGNOSIS — Z87891 Personal history of nicotine dependence: Secondary | ICD-10-CM | POA: Diagnosis not present

## 2020-09-30 DIAGNOSIS — I10 Essential (primary) hypertension: Secondary | ICD-10-CM | POA: Diagnosis not present

## 2020-09-30 DIAGNOSIS — Z86010 Personal history of colonic polyps: Secondary | ICD-10-CM | POA: Insufficient documentation

## 2020-09-30 MED ORDER — PREDNISONE 50 MG PO TABS
60.0000 mg | ORAL_TABLET | Freq: Once | ORAL | Status: AC
  Administered 2020-09-30: 60 mg via ORAL
  Filled 2020-09-30: qty 1

## 2020-09-30 MED ORDER — ONDANSETRON 8 MG PO TBDP
8.0000 mg | ORAL_TABLET | Freq: Once | ORAL | Status: AC
  Administered 2020-09-30: 8 mg via ORAL
  Filled 2020-09-30: qty 1

## 2020-09-30 MED ORDER — HYDROMORPHONE HCL 2 MG/ML IJ SOLN
2.0000 mg | Freq: Once | INTRAMUSCULAR | Status: AC
  Administered 2020-09-30: 2 mg via INTRAMUSCULAR
  Filled 2020-09-30: qty 1

## 2020-09-30 MED ORDER — PREDNISONE 50 MG PO TABS: 50.0000 mg | ORAL_TABLET | Freq: Every day | ORAL | 0 refills | Status: DC

## 2020-09-30 NOTE — ED Triage Notes (Signed)
Pt reports left hip pain for about a year, pt saw ortho Dr about a month ago and he told pt he needed PT, but pt came down with covid and had to reschedule-pt starts PT on Wednesday, pain is worse, pain pill not helping ( oxycodone 10/325)

## 2020-09-30 NOTE — Discharge Instructions (Addendum)
Continue taking oxycodone-acetaminophen as needed for pain.  Continue using ibuprofen as needed.  Please follow-up with your orthopedic doctor to resume physical therapy.  Return if symptoms are getting worse.

## 2020-09-30 NOTE — ED Provider Notes (Signed)
[facility] Provider Note   CSN: [id number] Arrival date & time: 09/30/20  [id number]     History Chief Complaint  Patient presents with   Hip Pain    Alexander Nunez is a 75 y.o. male.  The history is provided by the patient.  Hip Pain He has history of hypertension, hyperlipidemia, interstitial lung disease, arthritis of the lumbar spine status post back surgery and comes in with exacerbation of chronic left hip pain.  He has been having pain in his left hip for over a year.  He had been referred to a neurosurgeon and had received epidural injections, and then was referred to an orthopedic specialist.  The orthopedic physician felt that his problem was bursitis and prescribed physical therapy.  Unfortunately, patient was not able to start the physical therapy as first his wife got ill and then the patient developed COVID-19.  He noted increased pain in his left hip yesterday and it has not resolved in spite of taking multiple doses of oxycodone-acetaminophen 10-325.  He denies any weakness or numbness or tingling and denies any bowel or bladder dysfunction.  He denies any unusual activity to precipitate this.  Pain is rated at 8/10.  Past Medical History:  Diagnosis Date   Anxiety    Arthritis    back   Back pain, chronic    Colon polyp    Diverticulitis    GERD (gastroesophageal reflux disease)    Heart palpitations    High cholesterol    Hypertension    Irritable bowel syndrome    Osteoporosis    Psoriasis    Sleep apnea    c- pap    Patient Active Problem List   Diagnosis Date Noted   ILD (interstitial lung disease) ([place]) 05/18/2019   Shortness of breath 05/18/2019   Allergic rhinitis 05/18/2019   Atypical chest pain 05/04/2014   Sleep apnea 05/04/2014   Abdominal bloating 07/22/2013   Spermatocele 05/02/2013   Chronic prostatitis 01/31/2013   Pain in testicle 01/31/2013   Weakness of left leg 08/12/2012   Generalized abdominal pain 09/02/2011    Lumbar canal stenosis 05/28/2011   Degenerative arthritis of lumbar spine 05/28/2011   SPL (spondylolisthesis) 05/28/2011   PERSONAL HX COLONIC POLYPS 04/19/2009   IRRITABLE BOWEL SYNDROME 04/06/2008   ABDOMINAL BLOATING 03/07/2008   CHANGE IN BOWELS 03/07/2008   ABDOMINAL PAIN-MULTIPLE SITES 03/07/2008   Essential hypertension 03/06/2008   GERD 03/06/2008   GASTRITIS 03/06/2008   CHRONIC RESPIRATORY DISEASE ARISE PERINTL PERIOD 03/06/2008   HYPERLIPIDEMIA 02/06/2003    Past Surgical History:  Procedure Laterality Date   BACK SURGERY  2015   CHOLECYSTECTOMY N/A 10/12/2015   Procedure: LAPAROSCOPIC CHOLECYSTECTOMY;  Surgeon: Aviva Signs, MD;  Location: [facility];  Service: General;  Laterality: N/A;   COLONOSCOPY     FOOT FRACTURE SURGERY     left foot   KNEE ARTHROSCOPY     left   TONSILLECTOMY     UPPER GASTROINTESTINAL ENDOSCOPY         Family History  Problem Relation Age of Onset   Arrhythmia Mother        Atrial fib   Arrhythmia Sister        Atrial fib   Stroke Sister    Breast cancer Sister    Bladder Cancer Father            Colon cancer Neg Hx    Stomach cancer Neg Hx    Esophageal cancer Neg Hx  Pancreatic cancer Neg Hx    Prostate cancer Neg Hx    Rectal cancer Neg Hx     Social History   Tobacco Use   Smoking status: Former    Packs/day: 2.00    Years: 11.00    Pack years: 22.00    Types: Cigarettes    Start date: 05/22/1962    Quit date: 05/21/1973    Years since quitting: 47.3   Smokeless tobacco: Never   Tobacco comments:    Quit 25 yrs now   Vaping Use   Vaping Use: Never used  Substance Use Topics   Alcohol use: Yes    Alcohol/week: 14.0 standard drinks    Types: 14 Shots of liquor per week    Comment: social   Drug use: No    Home Medications Prior to Admission medications   Medication Sig Start Date End Date Taking? Authorizing Provider  albuterol (VENTOLIN HFA) 108 (90 Base) MCG/ACT inhaler Inhale into the lungs as needed.  07/25/19   [provider]  ALPRAZolam Duanne Moron) 0.5 MG tablet Take 0.25-0.5 mg by mouth at bedtime as needed for sleep.     [provider]  aspirin EC 81 MG tablet Take 81 mg by mouth daily.    [provider]  doxazosin (CARDURA) 4 MG tablet Take 4 mg by mouth at bedtime.      [provider]  fluticasone (FLONASE) 50 MCG/ACT nasal spray PLACE 1 SPRAY INTO BOTH NOSTRILS DAILY. 01/18/20   Lauraine Rinne, NP  lisinopril (PRINIVIL,ZESTRIL) 10 MG tablet Take 10 mg by mouth every evening.     [provider]  metoprolol tartrate (LOPRESSOR) 25 MG tablet Take 1 tablet (25 mg total) by mouth 2 (two) times daily. 05/05/14   Black, Lezlie Octave, NP  pantoprazole (PROTONIX) 40 MG tablet Take 40 mg by mouth daily.    [provider]  pravastatin (PRAVACHOL) 20 MG tablet Take 20 mg by mouth every evening.     [provider]  triamcinolone cream (KENALOG) 0.1 % Apply 1 application topically 2 (two) times daily. 10/23/19   Wurst, [place], PA-C    Allergies    Cephalexin and Contrast media [iodinated diagnostic agents]  Review of Systems   Review of Systems  All other systems reviewed and are negative.  Physical Exam Updated Vital Signs BP (!) 117/65 (BP Location: Right Arm)   Pulse 76   Temp 97.8 F (36.6 C) (Oral)   Resp 20   Ht '5\' 11"'$  (1.803 m)   Wt 129.3 kg   SpO2 94%   BMI 39.75 kg/m   Physical Exam Vitals and nursing note reviewed.  Morbidly obese 75 year old male, resting comfortably and in no acute distress. Vital signs are normal. Oxygen saturation is 94%, which is normal. Head is normocephalic and atraumatic. PERRLA, EOMI. Oropharynx is clear. Neck is nontender and supple without adenopathy or JVD. Back has mild tenderness in the mid and upper lumbar spine.  There is no CVA tenderness. Lungs are clear without rales, wheezes, or rhonchi. Chest is nontender. Heart has regular rate and rhythm without murmur. Abdomen is soft,  flat, nontender without masses or hepatosplenomegaly and peristalsis is normoactive. Extremities: There is tenderness palpation over the lateral aspect of the left hip.  Pain is elicited with passive external rotation but not with flexion and extension and internal rotation.  Straight leg raise is negative. Skin is warm and dry without rash. Neurologic: Mental status is normal, cranial nerves are  intact, there are no motor or sensory deficits.  ED Results / Procedures / Treatments    Procedures Procedures   Medications Ordered in ED Medications  predniSONE (DELTASONE) tablet 60 mg (has no administration in time range)  ondansetron (ZOFRAN-ODT) disintegrating tablet 8 mg (has no administration in time range)  HYDROmorphone (DILAUDID) injection 2 mg (has no administration in time range)    ED Course  I have reviewed the triage vital signs and the nursing notes.  MDM Rules/Calculators/A&P                         Exacerbation of chronic left hip pain.  Pain is probably secondary to combination of osteoarthritis, bursitis, and possible component of radicular pain.  Patient has already had thorough outpatient work-up and additional imaging is not necessary today.  His main issue is pain control.  He will be given a dose of prednisone and an injection of hydromorphone.  Old records reviewed confirming recent hip x-ray showing significant osteoarthritis.  He had reasonable relief of pain with above-noted treatment.  He is discharged with prescription of prednisone for 5 days, advised to continue his current regimen.  Follow-up with his orthopedic physician.  Advised on the benefits of weight loss, recommended he try water aerobics to try to increase calorie consumption with minimizing impacts on his joints.  Return precautions discussed.  Final Clinical Impression(s) / ED Diagnoses Final diagnoses:  Pain in left hip    Rx / DC Orders ED Discharge Orders          Ordered    predniSONE  (DELTASONE) 50 MG tablet  Daily        09/30/20 [id number]             Delora Fuel, MD [id number] [phone]

## 2020-10-01 DIAGNOSIS — M7061 Trochanteric bursitis, right hip: Secondary | ICD-10-CM | POA: Diagnosis not present

## 2020-10-03 ENCOUNTER — Encounter (HOSPITAL_COMMUNITY): Payer: Self-pay | Admitting: Physical Therapy

## 2020-10-03 ENCOUNTER — Other Ambulatory Visit: Payer: Self-pay

## 2020-10-03 ENCOUNTER — Ambulatory Visit (HOSPITAL_COMMUNITY): Payer: Medicare HMO | Attending: Orthopedic Surgery | Admitting: Physical Therapy

## 2020-10-03 DIAGNOSIS — M6281 Muscle weakness (generalized): Secondary | ICD-10-CM

## 2020-10-03 DIAGNOSIS — R262 Difficulty in walking, not elsewhere classified: Secondary | ICD-10-CM | POA: Insufficient documentation

## 2020-10-03 DIAGNOSIS — M25552 Pain in left hip: Secondary | ICD-10-CM | POA: Insufficient documentation

## 2020-10-03 NOTE — Therapy (Signed)
[place], [place], [address] Phone: [phone]   Fax:  [phone]  Physical Therapy Evaluation  Patient Details  Name: Alexander Nunez MRN: [id number] Date of Birth: Aug 14, 1945 No data recorded  Encounter Date: 10/03/2020   PT End of Session - 10/03/20 1002     Visit Number 1    Number of Visits 12    Date for PT Re-Evaluation 11/14/20    Authorization Type [employer] HMO - no auth - visits med. nec.    Progress Note Due on Visit 10    PT Start Time 1002    PT Stop Time 1044    PT Time Calculation (min) 42 min    Activity Tolerance Patient tolerated treatment well    Behavior During Therapy WFL for tasks assessed/performed             Past Medical History:  Diagnosis Date   Anxiety    Arthritis    back   Back pain, chronic    Colon polyp    Diverticulitis    GERD (gastroesophageal reflux disease)    Heart palpitations    High cholesterol    Hypertension    Irritable bowel syndrome    Osteoporosis    Psoriasis    Sleep apnea    c- pap    Past Surgical History:  Procedure Laterality Date   BACK SURGERY  2015   CHOLECYSTECTOMY N/A 10/12/2015   Procedure: LAPAROSCOPIC CHOLECYSTECTOMY;  Surgeon: Aviva Signs, MD;  Location: [facility];  Service: General;  Laterality: N/A;   COLONOSCOPY     FOOT FRACTURE SURGERY     left foot   KNEE ARTHROSCOPY     left   TONSILLECTOMY     UPPER GASTROINTESTINAL ENDOSCOPY      There were no vitals filed for this visit.    Subjective Assessment - 10/03/20 1012     Subjective States that on Saturday his left hip really bothered him and his pain medication wasn't touching his pain and could barely sleep the night before and went to the ED and they gave him a pain shot and put him on prednisone. States that he saw his orthopedic MD on Monday last week. States that they gave him an anti-inflammatory. States since he has been on all of this medication he is no  longer having the intense pain he was having. States it is still hurting and he is using ice as needed. States that his hip has been bothering him for 2 years on and off. States that he went to [place] [facility] last year and couldn't barely walk. States that he had surgery on his back in 2015. States that he had pain in his hip that started about 2 years. States he was getting epidural shots which wasn't touching his hip pain. States that he then saw an MD who said he got an injection in his hip which didn't last long. Reports that walking and standing in his hip. States that he feels a shock going through his right leg with standing and turning his hip out to the side. Reports he has just been using a cane some    Pertinent History lumbar fusion 2015 L4-5    Currently in Pain? Yes    Pain Score 6     Pain Location Hip    Pain Orientation Left    Pain Descriptors / Indicators Aching;Sharp    Pain Type Chronic pain  Pain Radiating Towards down leg    Pain Onset More than a month ago    Pain Frequency Constant    Aggravating Factors  sharp with movement    Pain Relieving Factors medication                [facility] PT Assessment - 10/03/20 0001       Assessment   Medical Diagnosis left hip bursistis    Next MD Visit as needed    Prior Therapy yes for back      Balance Screen   Has the patient fallen in the past 6 months No      [place] residence    Living Arrangements Spouse/significant other    Available Help at Discharge Family      Prior Function   Level 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   Overall Cognitive Status Within Functional Limits for tasks assessed      Observation/Other Assessments   Focus on Therapeutic Outcomes (FOTO)  35% function      ROM / Strength   AROM / PROM / Strength AROM;Strength      AROM   Overall AROM Comments hip ROM in supine/hook lying    AROM Assessment Site Hip;Lumbar     Right/Left Hip Right;Left    Right Hip Flexion 105    Right Hip External Rotation  40    Right Hip Internal Rotation  10    Left Hip Flexion 85   painin left hip   Left Hip External Rotation  45   pain in hip   Left Hip Internal Rotation  25   pain in hip     Strength   Strength Assessment Site Hip;Knee;Ankle      Palpation   Spinal mobility will assess in future sessions - hypomobility noted in left hip with traction adn lateral glide -muscle guarding noted    Palpation comment tenderness to palpation along left hip, groin, lumbar paraspinals      Special Tests    Special Tests Lumbar    Lumbar Tests Straight Leg Raise      Straight Leg Raise   Findings Negative                        Objective measurements completed on examination: See above findings.       [facility] Adult PT Treatment/Exercise - 10/03/20 0001       Ambulation/Gait   Ambulation/Gait Yes    Ambulation/Gait Assistance 6: Modified independent (Device/Increase time)    Assistive device Straight cane    Gait Pattern Decreased stride length;Decreased hip/knee flexion - left;Left hip hike;Decreased stance time - left;Decreased step length - right    Gait velocity reduced      Exercises   Exercises Knee/Hip      Knee/Hip Exercises: Supine   Knee Flexion Limitations bent knee fall out - one side L 2 minutes    Other Supine Knee/Hip Exercises glute sets x15 5 " holds focus on relaxing                    PT Education - 10/03/20 1032     Education Details on current presentation, on HEP on findings and HEP, educated paiten ton use of cane on right side, step through gait pattern and adjusted cane to proper height.    Person(s) Educated Patient    Methods  Explanation    Comprehension Verbalized understanding              PT Short Term Goals - 10/03/20 1015       PT SHORT TERM GOAL #1   Title Patient will be able to demonstrate left hip ROM without > 3/10 pain    Time 3     Period Weeks    Status New    Target Date 10/24/20      PT SHORT TERM GOAL #2   Title Patient will report at least 50% improvement in overall symptoms and/or function to demonstrate improved functional mobility    Time 3    Period Weeks    Status New    Target Date 10/24/20      PT SHORT TERM GOAL #3   Title Patient will be independent in self management strategies to improve quality of life and functional outcomes.    Time 3    Period Weeks    Status New    Target Date 10/24/20               PT Long Term Goals - 10/03/20 1016       PT LONG TERM GOAL #1   Title Patient will report at least 75% improvement in overall symptoms and/or function to demonstrate improved functional mobility    Time 6    Period Weeks    Status New    Target Date 11/14/20      PT LONG TERM GOAL #2   Title Patient will be able to ambulate without severe pain, limp or need of assistive device to demonstrate improved functional mobility    Time 6    Period Weeks    Status New    Target Date 11/14/20      PT LONG TERM GOAL #3   Title Patient will mee predicted FOTO score to demonstrate improved overall function.    Time 6    Period Weeks    Status New    Target Date 11/14/20                    Plan - 10/03/20 1050     Clinical Impression Statement Patient is a 75 y.o. male who presents to physical therapy with complaint of left hip pain that has progressively worsened over the last year. Patient demonstrates decreased strength, ROM restriction, balance deficits and gait abnormalities which are likely contributing to symptoms of pain and are negatively impacting patient ability to perform ADLs and functional mobility tasks. Patient will benefit from skilled physical therapy services to address these deficits to reduce pain, improve level of function with ADLs, functional mobility tasks, and reduce risk for falls.    Personal Factors and Comorbidities Comorbidity 1    Comorbidities  lumbar fusion L4/5 2015, overweight    Examination-Activity Limitations Sit;Sleep;Bed Mobility;Bend;Squat;Stairs;Carry;Stand;Transfers;Dressing;Locomotion Level;Lift    Examination-Participation Restrictions Meal Prep;Yard Work;Driving;Community Activity;Shop;Cleaning    Stability/Clinical Decision Making Stable/Uncomplicated    Clinical Decision Making Low    Rehab Potential Good    PT Frequency 2x / week    PT Duration 6 weeks    PT Treatment/Interventions ADLs/Self Care Home Management;Aquatic Therapy;Cryotherapy;Electrical Stimulation;Moist Heat;Therapeutic activities;Iontophoresis '4mg'$ /ml Dexamethasone;Therapeutic exercise;Traction;Balance training;Functional mobility training;Stair training;Gait training;DME Instruction;Neuromuscular re-education;Patient/family education;Manual techniques;Dry needling;Passive range of motion;Joint Manipulations    PT Next Visit Plan assess prone toleance and manual/exercises in this position, pain management strategies, STM/ joint mobilizations, ROM/isometrics    PT Home Exercise Plan unilateral bent knee fall outs,  glute isometric    Consulted and Agree with Plan of Care Patient             Patient will benefit from skilled therapeutic intervention in order to improve the following deficits and impairments:  Pain, Decreased strength, Decreased activity tolerance, Decreased mobility, Decreased balance, Difficulty walking, Decreased range of motion, Decreased endurance  Visit Diagnosis: Pain in left hip  Muscle weakness (generalized)  Difficulty in walking, not elsewhere classified     Problem List Patient Active Problem List   Diagnosis Date Noted   ILD (interstitial lung disease) ([place]) 05/18/2019   Shortness of breath 05/18/2019   Allergic rhinitis 05/18/2019   Atypical chest pain 05/04/2014   Sleep apnea 05/04/2014   Abdominal bloating 07/22/2013   Spermatocele 05/02/2013   Chronic prostatitis 01/31/2013   Pain in testicle 01/31/2013    Weakness of left leg 08/12/2012   Generalized abdominal pain 09/02/2011   Lumbar canal stenosis 05/28/2011   Degenerative arthritis of lumbar spine 05/28/2011   SPL (spondylolisthesis) 05/28/2011   PERSONAL HX COLONIC POLYPS 04/19/2009   IRRITABLE BOWEL SYNDROME 04/06/2008   ABDOMINAL BLOATING 03/07/2008   CHANGE IN BOWELS 03/07/2008   ABDOMINAL PAIN-MULTIPLE SITES 03/07/2008   Essential hypertension 03/06/2008   GERD 03/06/2008   GASTRITIS 03/06/2008   CHRONIC RESPIRATORY DISEASE ARISE PERINTL PERIOD 03/06/2008   HYPERLIPIDEMIA 02/06/2003   10:58 AM, 10/03/20 Jerene Pitch, DPT Physical Therapy with [facility]  [phone] office   [facility] [address] [place], [place], [address] Phone: [phone]   Fax:  [phone]  Name: Alexander Nunez MRN: [id number] Date of Birth: 1945/02/04

## 2020-10-04 ENCOUNTER — Ambulatory Visit (HOSPITAL_COMMUNITY): Payer: Medicare HMO | Attending: Orthopedic Surgery | Admitting: Physical Therapy

## 2020-10-04 DIAGNOSIS — R262 Difficulty in walking, not elsewhere classified: Secondary | ICD-10-CM | POA: Insufficient documentation

## 2020-10-04 DIAGNOSIS — M25552 Pain in left hip: Secondary | ICD-10-CM | POA: Insufficient documentation

## 2020-10-04 DIAGNOSIS — M6281 Muscle weakness (generalized): Secondary | ICD-10-CM | POA: Insufficient documentation

## 2020-10-04 NOTE — Therapy (Signed)
[place], [place], [address] Phone: [phone]   Fax:  [phone]  Physical Therapy Treatment  Patient Details  Name: Alexander Nunez MRN: [id number] Date of Birth: March 12, 1945 No data recorded  Encounter Date: 10/04/2020   PT End of Session - 10/04/20 0854     Visit Number 2    Number of Visits 12    Date for PT Re-Evaluation 11/14/20    Authorization Type [employer] Medicare HMO - no auth - visits med. nec.    Progress Note Due on Visit 10    PT Start Time 0830    PT Stop Time 0910    PT Time Calculation (min) 40 min    Activity Tolerance Patient tolerated treatment well    Behavior During Therapy WFL for tasks assessed/performed             Past Medical History:  Diagnosis Date   Anxiety    Arthritis    back   Back pain, chronic    Colon polyp    Diverticulitis    GERD (gastroesophageal reflux disease)    Heart palpitations    High cholesterol    Hypertension    Irritable bowel syndrome    Osteoporosis    Psoriasis    Sleep apnea    c- pap    Past Surgical History:  Procedure Laterality Date   BACK SURGERY  2015   CHOLECYSTECTOMY N/A 10/12/2015   Procedure: LAPAROSCOPIC CHOLECYSTECTOMY;  Surgeon: Aviva Signs, MD;  Location: [facility];  Service: General;  Laterality: N/A;   COLONOSCOPY     FOOT FRACTURE SURGERY     left foot   KNEE ARTHROSCOPY     left   TONSILLECTOMY     UPPER GASTROINTESTINAL ENDOSCOPY      There were no vitals filed for this visit.   Subjective Assessment - 10/04/20 0832     Subjective Pt thinks that he did to many of the exercises .  He did quite a few    Pertinent History lumbar fusion 2015 L4-5    Currently in Pain? Yes    Pain Score 7     Pain Location Hip    Pain Orientation Left    Pain Descriptors / Indicators Aching    Pain Type Chronic pain    Pain Radiating Towards down leg    Pain Onset More than a month ago    Pain Frequency Constant    Aggravating  Factors  motion    Pain Relieving Factors meds    Effect of Pain on Daily Activities limits                               [facility] Adult PT Treatment/Exercise - 10/04/20 0001       Knee/Hip Exercises: Stretches   Active Hamstring Stretch Left;3 reps;30 seconds    Hip Flexor Stretch Left;1 rep;30 seconds    Piriformis Stretch Left;1 rep;60 seconds    Other Knee/Hip Stretches trunk rotation x 5, POE followed by press up    Other Knee/Hip Stretches knee to chest 3 x 30"      Knee/Hip Exercises: Supine   Bridges Both;10 reps    Knee Flexion Limitations bent knee fall out - one side L 2 minutes    Other Supine Knee/Hip Exercises abset x 10      Knee/Hip Exercises: Prone   Other Prone Exercises glut set  x 10                      PT Short Term Goals - 10/04/20 0905       PT SHORT TERM GOAL #1   Title Patient will be able to demonstrate left hip ROM without > 3/10 pain    Time 3    Period Weeks    Status On-going    Target Date 10/24/20      PT SHORT TERM GOAL #2   Title Patient will report at least 50% improvement in overall symptoms and/or function to demonstrate improved functional mobility    Time 3    Period Weeks    Status On-going    Target Date 10/24/20      PT SHORT TERM GOAL #3   Title Patient will be independent in self management strategies to improve quality of life and functional outcomes.    Time 3    Period Weeks    Status On-going    Target Date 10/24/20      PT SHORT TERM GOAL #4   Status On-going      PT SHORT TERM GOAL #5   Status On-going               PT Long Term Goals - 10/04/20 0910       PT LONG TERM GOAL #1   Title Patient will report at least 75% improvement in overall symptoms and/or function to demonstrate improved functional mobility    Time 6    Period Weeks    Status On-going      PT LONG TERM GOAL #2   Title Patient will be able to ambulate without severe pain, limp or need of assistive  device to demonstrate improved functional mobility    Time 6    Period Weeks    Status On-going      PT LONG TERM GOAL #3   Title Patient will mee predicted FOTO score to demonstrate improved overall function.    Time 6    Period Weeks    Status Not Met      PT LONG TERM GOAL #4   Title Patient will dmeonstrate increased hip internal rotation of 35 degrees in bilateral hips    Time 8    Period Weeks    Status On-going      PT LONG TERM GOAL #5   Title Patint will dmeosntrate independence with HEP    Time 8    Period Weeks    Status On-going                   Plan - 10/04/20 1601     Clinical Impression Statement Reviewed evaluation and goals with pt.  Began a prone exercise program which centralized pain to back as well as starting a stabilization and stretching program.    Personal Factors and Comorbidities Comorbidity 1    Comorbidities lumbar fusion L4/5 2015, overweight    Examination-Activity Limitations Sit;Sleep;Bed Mobility;Bend;Squat;Stairs;Carry;Stand;Transfers;Dressing;Locomotion Level;Lift    Examination-Participation Restrictions Meal Prep;Yard Work;Driving;Community Activity;Shop;Cleaning    Stability/Clinical Decision Making Stable/Uncomplicated    Rehab Potential Good    PT Frequency 2x / week    PT Duration 6 weeks    PT Treatment/Interventions ADLs/Self Care Home Management;Aquatic Therapy;Cryotherapy;Electrical Stimulation;Moist Heat;Therapeutic activities;Iontophoresis 78m/ml Dexamethasone;Therapeutic exercise;Traction;Balance training;Functional mobility training;Stair training;Gait training;DME Instruction;Neuromuscular re-education;Patient/family education;Manual techniques;Dry needling;Passive range of motion;Joint Manipulations    PT Next Visit Plan , pain management strategies, STM/ joint mobilizations,  ROM/isometrics, progress stab exercises and stretches    PT Home Exercise Plan unilateral bent knee fall outs, glute isometric; 9/1:  POE,  press up , prone glut sets    Consulted and Agree with Plan of Care Patient             Patient will benefit from skilled therapeutic intervention in order to improve the following deficits and impairments:  Pain, Decreased strength, Decreased activity tolerance, Decreased mobility, Decreased balance, Difficulty walking, Decreased range of motion, Decreased endurance  Visit Diagnosis: Pain in left hip  Muscle weakness (generalized)  Difficulty in walking, not elsewhere classified     Problem List Patient Active Problem List   Diagnosis Date Noted   ILD (interstitial lung disease) ([place]) 05/18/2019   Shortness of breath 05/18/2019   Allergic rhinitis 05/18/2019   Atypical chest pain 05/04/2014   Sleep apnea 05/04/2014   Abdominal bloating 07/22/2013   Spermatocele 05/02/2013   Chronic prostatitis 01/31/2013   Pain in testicle 01/31/2013   Weakness of left leg 08/12/2012   Generalized abdominal pain 09/02/2011   Lumbar canal stenosis 05/28/2011   Degenerative arthritis of lumbar spine 05/28/2011   SPL (spondylolisthesis) 05/28/2011   PERSONAL HX COLONIC POLYPS 04/19/2009   IRRITABLE BOWEL SYNDROME 04/06/2008   ABDOMINAL BLOATING 03/07/2008   CHANGE IN BOWELS 03/07/2008   ABDOMINAL PAIN-MULTIPLE SITES 03/07/2008   Essential hypertension 03/06/2008   GERD 03/06/2008   GASTRITIS 03/06/2008   CHRONIC RESPIRATORY DISEASE ARISE PERINTL PERIOD 03/06/2008   HYPERLIPIDEMIA 02/06/2003  Rayetta Humphrey, PT CLT [phone]  10/04/2020, 9:16 AM  [facility] [address] [place], [place], [address] Phone: [phone]   Fax:  [phone]  Name: KOBI MARIO MRN: [id number] Date of Birth: 1945-11-23

## 2020-10-05 ENCOUNTER — Ambulatory Visit (HOSPITAL_COMMUNITY)
Admission: RE | Admit: 2020-10-05 | Discharge: 2020-10-05 | Disposition: A | Discharge status: Home / Self Care | Payer: Medicare HMO | Source: Ambulatory Visit | Attending: Pulmonary Disease | Admitting: Pulmonary Disease

## 2020-10-05 ENCOUNTER — Other Ambulatory Visit: Payer: Self-pay

## 2020-10-05 DIAGNOSIS — J439 Emphysema, unspecified: Secondary | ICD-10-CM | POA: Diagnosis not present

## 2020-10-05 DIAGNOSIS — J479 Bronchiectasis, uncomplicated: Secondary | ICD-10-CM | POA: Diagnosis not present

## 2020-10-05 DIAGNOSIS — I700 Atherosclerosis of aorta: Secondary | ICD-10-CM | POA: Diagnosis not present

## 2020-10-05 DIAGNOSIS — J849 Interstitial pulmonary disease, unspecified: Secondary | ICD-10-CM

## 2020-10-10 ENCOUNTER — Ambulatory Visit (HOSPITAL_COMMUNITY): Payer: Medicare HMO | Admitting: Physical Therapy

## 2020-10-10 ENCOUNTER — Other Ambulatory Visit: Payer: Self-pay

## 2020-10-10 ENCOUNTER — Encounter (HOSPITAL_COMMUNITY): Payer: Self-pay | Admitting: Physical Therapy

## 2020-10-10 DIAGNOSIS — M25552 Pain in left hip: Secondary | ICD-10-CM | POA: Diagnosis not present

## 2020-10-10 DIAGNOSIS — R262 Difficulty in walking, not elsewhere classified: Secondary | ICD-10-CM

## 2020-10-10 DIAGNOSIS — M6281 Muscle weakness (generalized): Secondary | ICD-10-CM | POA: Diagnosis not present

## 2020-10-10 NOTE — Patient Instructions (Signed)
Access Code: [email or web address] URL: [email or web address] Date: 10/10/2020 Prepared by: Mitzi Hansen Rena Sweeden  Exercises Prone Hip Extension - 1 x daily - 7 x weekly - 2 sets - 10 reps Clamshell - 1 x daily - 7 x weekly - 3 sets - 10 reps

## 2020-10-10 NOTE — Therapy (Signed)
[place], [place], [address] Phone: [phone]   Fax:  [phone]  Physical Therapy Treatment  Patient Details  Name: Alexander Nunez MRN: [id number] Date of Birth: 01/07/1946 No data recorded  Encounter Date: 10/10/2020   PT End of Session - 10/10/20 0922     Visit Number 3    Number of Visits 12    Date for PT Re-Evaluation 11/14/20    Authorization Type [employer] HMO - no auth - visits med. nec.    Progress Note Due on Visit 10    PT Start Time [phone]    PT Stop Time 1000    PT Time Calculation (min) 38 min    Activity Tolerance Patient tolerated treatment well    Behavior During Therapy WFL for tasks assessed/performed             Past Medical History:  Diagnosis Date   Anxiety    Arthritis    back   Back pain, chronic    Colon polyp    Diverticulitis    GERD (gastroesophageal reflux disease)    Heart palpitations    High cholesterol    Hypertension    Irritable bowel syndrome    Osteoporosis    Psoriasis    Sleep apnea    c- pap    Past Surgical History:  Procedure Laterality Date   BACK SURGERY  2015   CHOLECYSTECTOMY N/A 10/12/2015   Procedure: LAPAROSCOPIC CHOLECYSTECTOMY;  Surgeon: Aviva Signs, MD;  Location: [facility];  Service: General;  Laterality: N/A;   COLONOSCOPY     FOOT FRACTURE SURGERY     left foot   KNEE ARTHROSCOPY     left   TONSILLECTOMY     UPPER GASTROINTESTINAL ENDOSCOPY      There were no vitals filed for this visit.   Subjective Assessment - 10/10/20 0922     Subjective Patient states hip has been doing better. He has burning symptoms in the back of his leg sometimes when moving leg out to the side.    Pertinent History lumbar fusion 2015 L4-5    Currently in Pain? Yes    Pain Score 4     Pain Location Hip    Pain Orientation Left    Pain Descriptors / Indicators Aching    Pain Type Chronic pain    Pain Onset More than a month ago    Pain  Frequency Constant                               [facility] Adult PT Treatment/Exercise - 10/10/20 0001       Knee/Hip Exercises: Stretches   Other Knee/Hip Stretches POE 30 seconds, press up x 10      Knee/Hip Exercises: Supine   Bridges Both;2 sets;10 reps    Other Supine Knee/Hip Exercises hip abd/add iso with belt and ball 10 x 10 second holds      Knee/Hip Exercises: Sidelying   Clams 3x10 LLE      Knee/Hip Exercises: Prone   Hip Extension Both;2 sets;10 reps                  Upper Extremity Functional Index Score :   /80   PT Education - 10/10/20 0922     Education Details HEP    Person(s) Educated Patient    Methods Explanation    Comprehension Verbalized understanding  PT Short Term Goals - 10/04/20 0905       PT SHORT TERM GOAL #1   Title Patient will be able to demonstrate left hip ROM without > 3/10 pain    Time 3    Period Weeks    Status On-going    Target Date 10/24/20      PT SHORT TERM GOAL #2   Title Patient will report at least 50% improvement in overall symptoms and/or function to demonstrate improved functional mobility    Time 3    Period Weeks    Status On-going    Target Date 10/24/20      PT SHORT TERM GOAL #3   Title Patient will be independent in self management strategies to improve quality of life and functional outcomes.    Time 3    Period Weeks    Status On-going    Target Date 10/24/20      PT SHORT TERM GOAL #4   Status On-going      PT SHORT TERM GOAL #5   Status On-going               PT Long Term Goals - 10/04/20 0910       PT LONG TERM GOAL #1   Title Patient will report at least 75% improvement in overall symptoms and/or function to demonstrate improved functional mobility    Time 6    Period Weeks    Status On-going      PT LONG TERM GOAL #2   Title Patient will be able to ambulate without severe pain, limp or need of assistive device to demonstrate improved  functional mobility    Time 6    Period Weeks    Status On-going      PT LONG TERM GOAL #3   Title Patient will mee predicted FOTO score to demonstrate improved overall function.    Time 6    Period Weeks    Status Not Met      PT LONG TERM GOAL #4   Title Patient will dmeonstrate increased hip internal rotation of 35 degrees in bilateral hips    Time 8    Period Weeks    Status On-going      PT LONG TERM GOAL #5   Title Patint will dmeosntrate independence with HEP    Time 8    Period Weeks    Status On-going                   Plan - 10/10/20 6222     Clinical Impression Statement Began session with lumbar extension exercises. Added glute strengthening which patient tolerates well. Patient notes increase in symptoms while completing clam exercise. Patient given intermittent cueing for mechanics throughout session with good carryover. Patient will continue to benefit from skilled physical therapy in order to reduce impairment and improve function.    Personal Factors and Comorbidities Comorbidity 1    Comorbidities lumbar fusion L4/5 2015, overweight    Examination-Activity Limitations Sit;Sleep;Bed Mobility;Bend;Squat;Stairs;Carry;Stand;Transfers;Dressing;Locomotion Level;Lift    Examination-Participation Restrictions Meal Prep;Yard Work;Driving;Community Activity;Shop;Cleaning    Stability/Clinical Decision Making Stable/Uncomplicated    Rehab Potential Good    PT Frequency 2x / week    PT Duration 6 weeks    PT Treatment/Interventions ADLs/Self Care Home Management;Aquatic Therapy;Cryotherapy;Electrical Stimulation;Moist Heat;Therapeutic activities;Iontophoresis 75m/ml Dexamethasone;Therapeutic exercise;Traction;Balance training;Functional mobility training;Stair training;Gait training;DME Instruction;Neuromuscular re-education;Patient/family education;Manual techniques;Dry needling;Passive range of motion;Joint Manipulations    PT Next Visit Plan , pain management  strategies, STM/  joint mobilizations, ROM/isometrics, progress stab exercises and stretches    PT Home Exercise Plan unilateral bent knee fall outs, glute isometric; 9/1:  POE, press up , prone glut sets 9/7 hip extension, clam    Consulted and Agree with Plan of Care Patient             Patient will benefit from skilled therapeutic intervention in order to improve the following deficits and impairments:  Pain, Decreased strength, Decreased activity tolerance, Decreased mobility, Decreased balance, Difficulty walking, Decreased range of motion, Decreased endurance  Visit Diagnosis: Pain in left hip  Difficulty in walking, not elsewhere classified  Muscle weakness (generalized)     Problem List Patient Active Problem List   Diagnosis Date Noted   ILD (interstitial lung disease) ([place]) 05/18/2019   Shortness of breath 05/18/2019   Allergic rhinitis 05/18/2019   Atypical chest pain 05/04/2014   Sleep apnea 05/04/2014   Abdominal bloating 07/22/2013   Spermatocele 05/02/2013   Chronic prostatitis 01/31/2013   Pain in testicle 01/31/2013   Weakness of left leg 08/12/2012   Generalized abdominal pain 09/02/2011   Lumbar canal stenosis 05/28/2011   Degenerative arthritis of lumbar spine 05/28/2011   SPL (spondylolisthesis) 05/28/2011   PERSONAL HX COLONIC POLYPS 04/19/2009   IRRITABLE BOWEL SYNDROME 04/06/2008   ABDOMINAL BLOATING 03/07/2008   CHANGE IN BOWELS 03/07/2008   ABDOMINAL PAIN-MULTIPLE SITES 03/07/2008   Essential hypertension 03/06/2008   GERD 03/06/2008   GASTRITIS 03/06/2008   CHRONIC RESPIRATORY DISEASE ARISE PERINTL PERIOD 03/06/2008   HYPERLIPIDEMIA 02/06/2003    10:02 AM, 10/10/20 Mearl Latin PT, DPT Physical Therapist at [facility] [address] [place], [place], [address] Phone: [phone]   Fax:  [phone]  Name: KYMERE FULLINGTON MRN: [id number] Date of  Birth: [birth year]

## 2020-10-11 ENCOUNTER — Ambulatory Visit (HOSPITAL_COMMUNITY): Payer: Medicare HMO

## 2020-10-15 ENCOUNTER — Other Ambulatory Visit: Payer: Self-pay

## 2020-10-15 ENCOUNTER — Encounter (HOSPITAL_COMMUNITY): Payer: Self-pay | Admitting: Physical Therapy

## 2020-10-15 ENCOUNTER — Ambulatory Visit (HOSPITAL_COMMUNITY): Payer: Medicare HMO | Admitting: Physical Therapy

## 2020-10-15 DIAGNOSIS — M25552 Pain in left hip: Secondary | ICD-10-CM

## 2020-10-15 DIAGNOSIS — R262 Difficulty in walking, not elsewhere classified: Secondary | ICD-10-CM

## 2020-10-15 DIAGNOSIS — M6281 Muscle weakness (generalized): Secondary | ICD-10-CM

## 2020-10-15 NOTE — Therapy (Signed)
[place], [place], [address] Phone: [phone]   Fax:  [phone]  Physical Therapy Treatment  Patient Details  Name: Alexander Nunez MRN: [id number] Date of Birth: 1945-12-11 No data recorded  Encounter Date: 10/15/2020   PT End of Session - 10/15/20 0748     Visit Number 4    Number of Visits 12    Date for PT Re-Evaluation 11/14/20    Authorization Type [employer] Medicare HMO - no auth - visits med. nec.    Progress Note Due on Visit 10    PT Start Time [phone]    PT Stop Time 0828    PT Time Calculation (min) 40 min    Activity Tolerance Patient tolerated treatment well    Behavior During Therapy WFL for tasks assessed/performed             Past Medical History:  Diagnosis Date   Anxiety    Arthritis    back   Back pain, chronic    Colon polyp    Diverticulitis    GERD (gastroesophageal reflux disease)    Heart palpitations    High cholesterol    Hypertension    Irritable bowel syndrome    Osteoporosis    Psoriasis    Sleep apnea    c- pap    Past Surgical History:  Procedure Laterality Date   BACK SURGERY  2015   CHOLECYSTECTOMY N/A 10/12/2015   Procedure: LAPAROSCOPIC CHOLECYSTECTOMY;  Surgeon: Aviva Signs, MD;  Location: [facility];  Service: General;  Laterality: N/A;   COLONOSCOPY     FOOT FRACTURE SURGERY     left foot   KNEE ARTHROSCOPY     left   TONSILLECTOMY     UPPER GASTROINTESTINAL ENDOSCOPY      There were no vitals filed for this visit.   Subjective Assessment - 10/15/20 0748     Subjective Hip was feeling fine after last time. Has been doing HEP. Hip is stiff this morning.    Pertinent History lumbar fusion 2015 L4-5    Currently in Pain? Yes    Pain Score 4     Pain Location Hip    Pain Orientation Left    Pain Descriptors / Indicators Aching    Pain Type Chronic pain    Pain Onset More than a month ago    Pain Frequency Constant                                [facility] Adult PT Treatment/Exercise - 10/15/20 0001       Knee/Hip Exercises: Standing   Hip Abduction Both;10 reps;3 sets    Other Standing Knee Exercises tandem stance 3x 30 second holds bilateral      Knee/Hip Exercises: Seated   Sit to Sand 10 reps;2 sets      Knee/Hip Exercises: Supine   Bridges Both;2 sets;10 reps      Knee/Hip Exercises: Sidelying   Clams 4x10 LLE; last 2 sets with red band at knees      Knee/Hip Exercises: Prone   Hip Extension Both;3 sets;10 reps                     PT Education - 10/15/20 0748     Education Details HEP    Person(s) Educated Patient    Methods Explanation    Comprehension Verbalized understanding  PT Short Term Goals - 10/04/20 0905       PT SHORT TERM GOAL #1   Title Patient will be able to demonstrate left hip ROM without > 3/10 pain    Time 3    Period Weeks    Status On-going    Target Date 10/24/20      PT SHORT TERM GOAL #2   Title Patient will report at least 50% improvement in overall symptoms and/or function to demonstrate improved functional mobility    Time 3    Period Weeks    Status On-going    Target Date 10/24/20      PT SHORT TERM GOAL #3   Title Patient will be independent in self management strategies to improve quality of life and functional outcomes.    Time 3    Period Weeks    Status On-going    Target Date 10/24/20      PT SHORT TERM GOAL #4   Status On-going      PT SHORT TERM GOAL #5   Status On-going               PT Long Term Goals - 10/04/20 0910       PT LONG TERM GOAL #1   Title Patient will report at least 75% improvement in overall symptoms and/or function to demonstrate improved functional mobility    Time 6    Period Weeks    Status On-going      PT LONG TERM GOAL #2   Title Patient will be able to ambulate without severe pain, limp or need of assistive device to demonstrate improved functional  mobility    Time 6    Period Weeks    Status On-going      PT LONG TERM GOAL #3   Title Patient will mee predicted FOTO score to demonstrate improved overall function.    Time 6    Period Weeks    Status Not Met      PT LONG TERM GOAL #4   Title Patient will dmeonstrate increased hip internal rotation of 35 degrees in bilateral hips    Time 8    Period Weeks    Status On-going      PT LONG TERM GOAL #5   Title Patint will dmeosntrate independence with HEP    Time 8    Period Weeks    Status On-going                   Plan - 10/15/20 2671     Clinical Impression Statement Continued with core and hip strengthening which patient tolerates well. Patient showing improving glute strength and is able to progress to resistance with clam exercise and complete increased reps of previously completed exercises. Intermittent cueing for mechanics with good carry over. Patient requires intermittent rest breaks for fatigue. Patient will continue to benefit from skilled physical therapy in order to reduce impairment and improve function.    Personal Factors and Comorbidities Comorbidity 1    Comorbidities lumbar fusion L4/5 2015, overweight    Examination-Activity Limitations Sit;Sleep;Bed Mobility;Bend;Squat;Stairs;Carry;Stand;Transfers;Dressing;Locomotion Level;Lift    Examination-Participation Restrictions Meal Prep;Yard Work;Driving;Community Activity;Shop;Cleaning    Stability/Clinical Decision Making Stable/Uncomplicated    Rehab Potential Good    PT Frequency 2x / week    PT Duration 6 weeks    PT Treatment/Interventions ADLs/Self Care Home Management;Aquatic Therapy;Cryotherapy;Electrical Stimulation;Moist Heat;Therapeutic activities;Iontophoresis 4mg /ml Dexamethasone;Therapeutic exercise;Traction;Balance training;Functional mobility training;Stair training;Gait training;DME Instruction;Neuromuscular re-education;Patient/family education;Manual techniques;Dry needling;Passive  range  of motion;Joint Manipulations    PT Next Visit Plan , pain management strategies, STM/ joint mobilizations, ROM/isometrics, progress stab exercises and stretches    PT Home Exercise Plan unilateral bent knee fall outs, glute isometric; 9/1:  POE, press up , prone glut sets 9/7 hip extension, clam 9/12 resistance for clam, standing hip abduction    Consulted and Agree with Plan of Care Patient             Patient will benefit from skilled therapeutic intervention in order to improve the following deficits and impairments:  Pain, Decreased strength, Decreased activity tolerance, Decreased mobility, Decreased balance, Difficulty walking, Decreased range of motion, Decreased endurance  Visit Diagnosis: Pain in left hip  Difficulty in walking, not elsewhere classified  Muscle weakness (generalized)     Problem List Patient Active Problem List   Diagnosis Date Noted   ILD (interstitial lung disease) ([place]) 05/18/2019   Shortness of breath 05/18/2019   Allergic rhinitis 05/18/2019   Atypical chest pain 05/04/2014   Sleep apnea 05/04/2014   Abdominal bloating 07/22/2013   Spermatocele 05/02/2013   Chronic prostatitis 01/31/2013   Pain in testicle 01/31/2013   Weakness of left leg 08/12/2012   Generalized abdominal pain 09/02/2011   Lumbar canal stenosis 05/28/2011   Degenerative arthritis of lumbar spine 05/28/2011   SPL (spondylolisthesis) 05/28/2011   PERSONAL HX COLONIC POLYPS 04/19/2009   IRRITABLE BOWEL SYNDROME 04/06/2008   ABDOMINAL BLOATING 03/07/2008   CHANGE IN BOWELS 03/07/2008   ABDOMINAL PAIN-MULTIPLE SITES 03/07/2008   Essential hypertension 03/06/2008   GERD 03/06/2008   GASTRITIS 03/06/2008   CHRONIC RESPIRATORY DISEASE ARISE PERINTL PERIOD 03/06/2008   HYPERLIPIDEMIA 02/06/2003    8:23 AM, 10/15/20 Mearl Latin PT, DPT Physical Therapist at [facility], [place], [address] Phone: [phone]   Fax:  [phone]  Name: Alexander Nunez MRN: [id number] Date of Birth: 09-26-1945

## 2020-10-15 NOTE — Patient Instructions (Signed)
Access Code: [email or web address] URL: [email or web address] Date: 10/15/2020 Prepared by: Mitzi Hansen Jartavious Mckimmy  Exercises Standing Hip Abduction with Counter Support - 1 x daily - 7 x weekly - 3 sets - 10 reps

## 2020-10-18 ENCOUNTER — Other Ambulatory Visit: Payer: Self-pay

## 2020-10-18 ENCOUNTER — Ambulatory Visit (HOSPITAL_COMMUNITY): Payer: Medicare HMO | Admitting: Physical Therapy

## 2020-10-18 ENCOUNTER — Encounter (HOSPITAL_COMMUNITY): Payer: Self-pay | Admitting: Physical Therapy

## 2020-10-18 DIAGNOSIS — M25552 Pain in left hip: Secondary | ICD-10-CM | POA: Diagnosis not present

## 2020-10-18 DIAGNOSIS — M6281 Muscle weakness (generalized): Secondary | ICD-10-CM

## 2020-10-18 DIAGNOSIS — R262 Difficulty in walking, not elsewhere classified: Secondary | ICD-10-CM

## 2020-10-18 NOTE — Therapy (Signed)
[place], [place], [address] Phone: [phone]   Fax:  [phone]  Physical Therapy Treatment  Patient Details  Name: Alexander Nunez MRN: [id number] Date of Birth: [birth year] No data recorded  Encounter Date: 10/18/2020   PT End of Session - 10/18/20 0748     Visit Number 5    Number of Visits 12    Date for PT Re-Evaluation 11/14/20    Authorization Type [employer] Medicare HMO - no auth - visits med. nec.    Progress Note Due on Visit 10    PT Start Time [phone]    PT Stop Time 0826    PT Time Calculation (min) 38 min    Activity Tolerance Patient tolerated treatment well    Behavior During Therapy WFL for tasks assessed/performed             Past Medical History:  Diagnosis Date   Anxiety    Arthritis    back   Back pain, chronic    Colon polyp    Diverticulitis    GERD (gastroesophageal reflux disease)    Heart palpitations    High cholesterol    Hypertension    Irritable bowel syndrome    Osteoporosis    Psoriasis    Sleep apnea    c- pap    Past Surgical History:  Procedure Laterality Date   BACK SURGERY  2015   CHOLECYSTECTOMY N/A 10/12/2015   Procedure: LAPAROSCOPIC CHOLECYSTECTOMY;  Surgeon: Aviva Signs, MD;  Location: [facility];  Service: General;  Laterality: N/A;   COLONOSCOPY     FOOT FRACTURE SURGERY     left foot   KNEE ARTHROSCOPY     left   TONSILLECTOMY     UPPER GASTROINTESTINAL ENDOSCOPY      There were no vitals filed for this visit.   Subjective Assessment - 10/18/20 0749     Subjective Patient states low back was sore after last time. Tried some exericses without relief.    Pertinent History lumbar fusion 2015 L4-5    Currently in Pain? Yes    Pain Score 7     Pain Location Back    Pain Orientation Lower    Pain Descriptors / Indicators Aching    Pain Type Chronic pain    Pain Onset More than a month ago    Pain Frequency Constant                                [facility] Adult PT Treatment/Exercise - 10/18/20 0001       Knee/Hip Exercises: Stretches   Other Knee/Hip Stretches SKTC BLE 5x 20 second hold      Knee/Hip Exercises: Standing   Hip Abduction Both;10 reps;3 sets    Other Standing Knee Exercises standing march 2x 10 bilateral      Knee/Hip Exercises: Prone   Hip Extension Both;3 sets;10 reps      Manual Therapy   Manual Therapy Soft tissue mobilization    Manual therapy comments completed independent from all other aspects of treatment    Soft tissue mobilization L glute max near SIJ                     PT Education - 10/18/20 0748     Education Details HEP, self STM, remaining active, walking program    Person(s) Educated Patient    Methods Explanation;Demonstration  Comprehension Verbalized understanding;Returned demonstration              PT Short Term Goals - 10/04/20 0905       PT SHORT TERM GOAL #1   Title Patient will be able to demonstrate left hip ROM without > 3/10 pain    Time 3    Period Weeks    Status On-going    Target Date 10/24/20      PT SHORT TERM GOAL #2   Title Patient will report at least 50% improvement in overall symptoms and/or function to demonstrate improved functional mobility    Time 3    Period Weeks    Status On-going    Target Date 10/24/20      PT SHORT TERM GOAL #3   Title Patient will be independent in self management strategies to improve quality of life and functional outcomes.    Time 3    Period Weeks    Status On-going    Target Date 10/24/20      PT SHORT TERM GOAL #4   Status On-going      PT SHORT TERM GOAL #5   Status On-going               PT Long Term Goals - 10/04/20 0910       PT LONG TERM GOAL #1   Title Patient will report at least 75% improvement in overall symptoms and/or function to demonstrate improved functional mobility    Time 6    Period Weeks    Status On-going      PT LONG TERM  GOAL #2   Title Patient will be able to ambulate without severe pain, limp or need of assistive device to demonstrate improved functional mobility    Time 6    Period Weeks    Status On-going      PT LONG TERM GOAL #3   Title Patient will mee predicted FOTO score to demonstrate improved overall function.    Time 6    Period Weeks    Status Not Met      PT LONG TERM GOAL #4   Title Patient will dmeonstrate increased hip internal rotation of 35 degrees in bilateral hips    Time 8    Period Weeks    Status On-going      PT LONG TERM GOAL #5   Title Patint will dmeosntrate independence with HEP    Time 8    Period Weeks    Status On-going                   Plan - 10/18/20 0748     Clinical Impression Statement Patient with increased tenderness in low back/left proximal glute region. Patient with no change in symptoms following. Completed STM to L glute with increase in tissue tension noted before which improves with mobilizations. Minimal change in symptoms following. Patient educated and demonstrated self STM with ball. Intermittent cueing required for mechanics with good carry over. Patient will continue to benefit from skilled physical therapy in order to reduce impairment and improve function.    Personal Factors and Comorbidities Comorbidity 1    Comorbidities lumbar fusion L4/5 2015, overweight    Examination-Activity Limitations Sit;Sleep;Bed Mobility;Bend;Squat;Stairs;Carry;Stand;Transfers;Dressing;Locomotion Level;Lift    Examination-Participation Restrictions Meal Prep;Yard Work;Driving;Community Activity;Shop;Cleaning    Stability/Clinical Decision Making Stable/Uncomplicated    Rehab Potential Good    PT Frequency 2x / week    PT Duration 6 weeks    PT  Treatment/Interventions ADLs/Self Care Home Management;Aquatic Therapy;Cryotherapy;Electrical Stimulation;Moist Heat;Therapeutic activities;Iontophoresis 4mg /ml Dexamethasone;Therapeutic exercise;Traction;Balance  training;Functional mobility training;Stair training;Gait training;DME Instruction;Neuromuscular re-education;Patient/family education;Manual techniques;Dry needling;Passive range of motion;Joint Manipulations    PT Next Visit Plan , pain management strategies, STM/ joint mobilizations, ROM/isometrics, progress stab exercises and stretches    PT Home Exercise Plan unilateral bent knee fall outs, glute isometric; 9/1:  POE, press up , prone glut sets 9/7 hip extension, clam 9/12 resistance for clam, standing hip abduction    Consulted and Agree with Plan of Care Patient             Patient will benefit from skilled therapeutic intervention in order to improve the following deficits and impairments:  Pain, Decreased strength, Decreased activity tolerance, Decreased mobility, Decreased balance, Difficulty walking, Decreased range of motion, Decreased endurance  Visit Diagnosis: Pain in left hip  Difficulty in walking, not elsewhere classified  Muscle weakness (generalized)     Problem List Patient Active Problem List   Diagnosis Date Noted   ILD (interstitial lung disease) ([place]) 05/18/2019   Shortness of breath 05/18/2019   Allergic rhinitis 05/18/2019   Atypical chest pain 05/04/2014   Sleep apnea 05/04/2014   Abdominal bloating 07/22/2013   Spermatocele 05/02/2013   Chronic prostatitis 01/31/2013   Pain in testicle 01/31/2013   Weakness of left leg 08/12/2012   Generalized abdominal pain 09/02/2011   Lumbar canal stenosis 05/28/2011   Degenerative arthritis of lumbar spine 05/28/2011   SPL (spondylolisthesis) 05/28/2011   PERSONAL HX COLONIC POLYPS 04/19/2009   IRRITABLE BOWEL SYNDROME 04/06/2008   ABDOMINAL BLOATING 03/07/2008   CHANGE IN BOWELS 03/07/2008   ABDOMINAL PAIN-MULTIPLE SITES 03/07/2008   Essential hypertension 03/06/2008   GERD 03/06/2008   GASTRITIS 03/06/2008   CHRONIC RESPIRATORY DISEASE ARISE PERINTL PERIOD 03/06/2008   HYPERLIPIDEMIA 02/06/2003     8:27 AM, 10/18/20 Mearl Latin PT, DPT Physical Therapist at [place], [place], [address] Phone: [phone]   Fax:  [phone]  Name: CURTEZ BRALLIER MRN: [id number] Date of Birth: [birth year]

## 2020-10-22 ENCOUNTER — Encounter (HOSPITAL_COMMUNITY): Payer: Medicare HMO | Admitting: Physical Therapy

## 2020-10-22 DIAGNOSIS — J019 Acute sinusitis, unspecified: Secondary | ICD-10-CM | POA: Diagnosis not present

## 2020-10-22 DIAGNOSIS — J301 Allergic rhinitis due to pollen: Secondary | ICD-10-CM | POA: Diagnosis not present

## 2020-10-22 DIAGNOSIS — Z681 Body mass index (BMI) 19 or less, adult: Secondary | ICD-10-CM | POA: Diagnosis not present

## 2020-10-25 ENCOUNTER — Other Ambulatory Visit: Payer: Self-pay

## 2020-10-25 ENCOUNTER — Ambulatory Visit (HOSPITAL_COMMUNITY): Payer: Medicare HMO | Admitting: Physical Therapy

## 2020-10-25 ENCOUNTER — Encounter (HOSPITAL_COMMUNITY): Payer: Self-pay | Admitting: Physical Therapy

## 2020-10-25 DIAGNOSIS — M6281 Muscle weakness (generalized): Secondary | ICD-10-CM

## 2020-10-25 DIAGNOSIS — R262 Difficulty in walking, not elsewhere classified: Secondary | ICD-10-CM | POA: Diagnosis not present

## 2020-10-25 DIAGNOSIS — M25552 Pain in left hip: Secondary | ICD-10-CM

## 2020-10-25 NOTE — Therapy (Signed)
[place], [place], [address] Phone: [phone]   Fax:  [phone]  Physical Therapy Treatment  Patient Details  Name: Alexander Nunez MRN: [id number] Date of Birth: [birth year] No data recorded  Encounter Date: 10/25/2020   PT End of Session - 10/25/20 0920     Visit Number 6    Number of Visits 12    Date for PT Re-Evaluation 11/14/20    Authorization Type [employer] HMO - no auth - visits med. nec.    Progress Note Due on Visit 10    PT Start Time 0920    PT Stop Time 1000    PT Time Calculation (min) 40 min    Activity Tolerance Patient tolerated treatment well    Behavior During Therapy WFL for tasks assessed/performed             Past Medical History:  Diagnosis Date   Anxiety    Arthritis    back   Back pain, chronic    Colon polyp    Diverticulitis    GERD (gastroesophageal reflux disease)    Heart palpitations    High cholesterol    Hypertension    Irritable bowel syndrome    Osteoporosis    Psoriasis    Sleep apnea    c- pap    Past Surgical History:  Procedure Laterality Date   BACK SURGERY  2015   CHOLECYSTECTOMY N/A 10/12/2015   Procedure: LAPAROSCOPIC CHOLECYSTECTOMY;  Surgeon: Aviva Signs, MD;  Location: [facility];  Service: General;  Laterality: N/A;   COLONOSCOPY     FOOT FRACTURE SURGERY     left foot   KNEE ARTHROSCOPY     left   TONSILLECTOMY     UPPER GASTROINTESTINAL ENDOSCOPY      There were no vitals filed for this visit.   Subjective Assessment - 10/25/20 0920     Subjective Hip has been doing better. It hurts and exercises start to help it to feel better when its bothering him.    Pertinent History lumbar fusion 2015 L4-5    Currently in Pain? Yes    Pain Score 6    with walking   Pain Location Hip    Pain Orientation Left    Pain Descriptors / Indicators Aching    Pain Type Chronic pain    Pain Onset More than a month ago    Pain Frequency  Intermittent                               [facility] Adult PT Treatment/Exercise - 10/25/20 0001       Knee/Hip Exercises: Standing   Hip Abduction Both;10 reps;3 sets    Abduction Limitations cueing for glute set on stance leg    Lateral Step Up Left;2 sets;10 reps;Hand Hold: 0;Step Height: 4"    Forward Step Up Left;2 sets;10 reps;Hand Hold: 1;Step Height: 4"    Functional Squat 2 sets;15 reps    Functional Squat Limitations with UE support    Other Standing Knee Exercises standing march 2x 15 bilateral                     PT Education - 10/25/20 0920     Education Details HEP    Person(s) Educated Patient    Methods Explanation    Comprehension Verbalized understanding  PT Short Term Goals - 10/04/20 0905       PT SHORT TERM GOAL #1   Title Patient will be able to demonstrate left hip ROM without > 3/10 pain    Time 3    Period Weeks    Status On-going    Target Date 10/24/20      PT SHORT TERM GOAL #2   Title Patient will report at least 50% improvement in overall symptoms and/or function to demonstrate improved functional mobility    Time 3    Period Weeks    Status On-going    Target Date 10/24/20      PT SHORT TERM GOAL #3   Title Patient will be independent in self management strategies to improve quality of life and functional outcomes.    Time 3    Period Weeks    Status On-going    Target Date 10/24/20      PT SHORT TERM GOAL #4   Status On-going      PT SHORT TERM GOAL #5   Status On-going               PT Long Term Goals - 10/04/20 0910       PT LONG TERM GOAL #1   Title Patient will report at least 75% improvement in overall symptoms and/or function to demonstrate improved functional mobility    Time 6    Period Weeks    Status On-going      PT LONG TERM GOAL #2   Title Patient will be able to ambulate without severe pain, limp or need of assistive device to demonstrate improved  functional mobility    Time 6    Period Weeks    Status On-going      PT LONG TERM GOAL #3   Title Patient will mee predicted FOTO score to demonstrate improved overall function.    Time 6    Period Weeks    Status Not Met      PT LONG TERM GOAL #4   Title Patient will dmeonstrate increased hip internal rotation of 35 degrees in bilateral hips    Time 8    Period Weeks    Status On-going      PT LONG TERM GOAL #5   Title Patint will dmeosntrate independence with HEP    Time 8    Period Weeks    Status On-going                   Plan - 10/25/20 0920     Clinical Impression Statement Patient requires minimal cueing for mechanics and positioning for previous completed exercises. Cueing for glute activation throughout session with good carry over. Patient demonstrating L quad/hip weakness with step up requiring RUE support for balance and strength deficit.  Patient overall progressing well and symptoms are decreasing. Patient will continue to benefit from skilled physical therapy in order to reduce impairment and improve function.    Personal Factors and Comorbidities Comorbidity 1    Comorbidities lumbar fusion L4/5 2015, overweight    Examination-Activity Limitations Sit;Sleep;Bed Mobility;Bend;Squat;Stairs;Carry;Stand;Transfers;Dressing;Locomotion Level;Lift    Examination-Participation Restrictions Meal Prep;Yard Work;Driving;Community Activity;Shop;Cleaning    Stability/Clinical Decision Making Stable/Uncomplicated    Rehab Potential Good    PT Frequency 2x / week    PT Duration 6 weeks    PT Treatment/Interventions ADLs/Self Care Home Management;Aquatic Therapy;Cryotherapy;Electrical Stimulation;Moist Heat;Therapeutic activities;Iontophoresis 69m/ml Dexamethasone;Therapeutic exercise;Traction;Balance training;Functional mobility training;Stair training;Gait training;DME Instruction;Neuromuscular re-education;Patient/family education;Manual techniques;Dry  needling;Passive range of  motion;Joint Manipulations    PT Next Visit Plan progress stab exercises and stretches; Glute strength    PT Home Exercise Plan unilateral bent knee fall outs, glute isometric; 9/1:  POE, press up , prone glut sets 9/7 hip extension, clam 9/12 resistance for clam, standing hip abduction 9/22 mini squat, marching with glute activation    Consulted and Agree with Plan of Care Patient             Patient will benefit from skilled therapeutic intervention in order to improve the following deficits and impairments:  Pain, Decreased strength, Decreased activity tolerance, Decreased mobility, Decreased balance, Difficulty walking, Decreased range of motion, Decreased endurance  Visit Diagnosis: Pain in left hip  Difficulty in walking, not elsewhere classified  Muscle weakness (generalized)     Problem List Patient Active Problem List   Diagnosis Date Noted   ILD (interstitial lung disease) ([facility]) 05/18/2019   Shortness of breath 05/18/2019   Allergic rhinitis 05/18/2019   Atypical chest pain 05/04/2014   Sleep apnea 05/04/2014   Abdominal bloating 07/22/2013   Spermatocele 05/02/2013   Chronic prostatitis 01/31/2013   Pain in testicle 01/31/2013   Weakness of left leg 08/12/2012   Generalized abdominal pain 09/02/2011   Lumbar canal stenosis 05/28/2011   Degenerative arthritis of lumbar spine 05/28/2011   SPL (spondylolisthesis) 05/28/2011   PERSONAL HX COLONIC POLYPS 04/19/2009   IRRITABLE BOWEL SYNDROME 04/06/2008   ABDOMINAL BLOATING 03/07/2008   CHANGE IN BOWELS 03/07/2008   ABDOMINAL PAIN-MULTIPLE SITES 03/07/2008   Essential hypertension 03/06/2008   GERD 03/06/2008   GASTRITIS 03/06/2008   CHRONIC RESPIRATORY DISEASE ARISE PERINTL PERIOD 03/06/2008   HYPERLIPIDEMIA 02/06/2003    9:59 AM, 10/25/20  S.  PT, DPT Physical Therapist at [facility]   [facility] [address] [place], [place], [address] Phone: [phone]   Fax:  [phone]  Name: Alexander Nunez MRN: [id number] Date of Birth: 10/11/1945    

## 2020-10-25 NOTE — Patient Instructions (Signed)
Access Code: [email or web address] URL: [email or web address] Date: 10/25/2020 Prepared by: Mitzi Hansen Runell Kovich  Exercises Mini Squat with Counter Support - 1 x daily - 7 x weekly - 2 sets - 15 reps Standing Marching - 1 x daily - 7 x weekly - 2 sets - 15 reps

## 2020-10-29 ENCOUNTER — Other Ambulatory Visit: Payer: Self-pay

## 2020-10-29 ENCOUNTER — Ambulatory Visit (HOSPITAL_COMMUNITY): Payer: Medicare HMO | Admitting: Physical Therapy

## 2020-10-29 ENCOUNTER — Encounter (HOSPITAL_COMMUNITY): Payer: Self-pay | Admitting: Physical Therapy

## 2020-10-29 DIAGNOSIS — M6281 Muscle weakness (generalized): Secondary | ICD-10-CM | POA: Diagnosis not present

## 2020-10-29 DIAGNOSIS — M25552 Pain in left hip: Secondary | ICD-10-CM | POA: Diagnosis not present

## 2020-10-29 DIAGNOSIS — R262 Difficulty in walking, not elsewhere classified: Secondary | ICD-10-CM

## 2020-10-29 NOTE — Therapy (Signed)
[place], [place], [address] Phone: [phone]   Fax:  [phone]  Physical Therapy Treatment  Patient Details  Name: Alexander Nunez MRN: [id number] Date of Birth: 12-Oct-1945 No data recorded  Encounter Date: 10/29/2020   PT End of Session - 10/29/20 0750     Visit Number 7    Number of Visits 12    Date for PT Re-Evaluation 11/14/20    Authorization Type [employer] Medicare HMO - no auth - visits med. nec.    Progress Note Due on Visit 10    PT Start Time 0750    PT Stop Time 0828    PT Time Calculation (min) 38 min    Activity Tolerance Patient tolerated treatment well    Behavior During Therapy WFL for tasks assessed/performed             Past Medical History:  Diagnosis Date   Anxiety    Arthritis    back   Back pain, chronic    Colon polyp    Diverticulitis    GERD (gastroesophageal reflux disease)    Heart palpitations    High cholesterol    Hypertension    Irritable bowel syndrome    Osteoporosis    Psoriasis    Sleep apnea    c- pap    Past Surgical History:  Procedure Laterality Date   BACK SURGERY  2015   CHOLECYSTECTOMY N/A 10/12/2015   Procedure: LAPAROSCOPIC CHOLECYSTECTOMY;  Surgeon: Aviva Signs, MD;  Location: [facility];  Service: General;  Laterality: N/A;   COLONOSCOPY     FOOT FRACTURE SURGERY     left foot   KNEE ARTHROSCOPY     left   TONSILLECTOMY     UPPER GASTROINTESTINAL ENDOSCOPY      There were no vitals filed for this visit.   Subjective Assessment - 10/29/20 0751     Subjective Patient states hip is sore this morning. Did some yard work over the weekend.    Pertinent History lumbar fusion 2015 L4-5    Currently in Pain? Yes    Pain Score 4     Pain Location Hip    Pain Orientation Left    Pain Descriptors / Indicators Aching    Pain Type Chronic pain    Pain Onset More than a month ago    Pain Frequency Intermittent                                [facility] Adult PT Treatment/Exercise - 10/29/20 0001       Knee/Hip Exercises: Standing   Lateral Step Up Left;10 reps;Step Height: 4";3 sets;Hand Hold: 1    Forward Step Up Left;10 reps;Hand Hold: 1;Step Height: 4";3 sets    Functional Squat 2 sets;15 reps    Functional Squat Limitations with UE support    SLS with Vectors 5x 5 second holds bilateral 2 sets    Other Standing Knee Exercises lateral stepping 8x 10 feet    Other Standing Knee Exercises standing march 2x 15 bilateral                     PT Education - 10/29/20 0750     Education Details HEP    Person(s) Educated Patient    Methods Explanation    Comprehension Verbalized understanding  PT Short Term Goals - 10/04/20 0905       PT SHORT TERM GOAL #1   Title Patient will be able to demonstrate left hip ROM without > 3/10 pain    Time 3    Period Weeks    Status On-going    Target Date 10/24/20      PT SHORT TERM GOAL #2   Title Patient will report at least 50% improvement in overall symptoms and/or function to demonstrate improved functional mobility    Time 3    Period Weeks    Status On-going    Target Date 10/24/20      PT SHORT TERM GOAL #3   Title Patient will be independent in self management strategies to improve quality of life and functional outcomes.    Time 3    Period Weeks    Status On-going    Target Date 10/24/20      PT SHORT TERM GOAL #4   Status On-going      PT SHORT TERM GOAL #5   Status On-going               PT Long Term Goals - 10/04/20 0910       PT LONG TERM GOAL #1   Title Patient will report at least 75% improvement in overall symptoms and/or function to demonstrate improved functional mobility    Time 6    Period Weeks    Status On-going      PT LONG TERM GOAL #2   Title Patient will be able to ambulate without severe pain, limp or need of assistive device to demonstrate improved functional  mobility    Time 6    Period Weeks    Status On-going      PT LONG TERM GOAL #3   Title Patient will mee predicted FOTO score to demonstrate improved overall function.    Time 6    Period Weeks    Status Not Met      PT LONG TERM GOAL #4   Title Patient will dmeonstrate increased hip internal rotation of 35 degrees in bilateral hips    Time 8    Period Weeks    Status On-going      PT LONG TERM GOAL #5   Title Patint will dmeosntrate independence with HEP    Time 8    Period Weeks    Status On-going                   Plan - 10/29/20 0750     Clinical Impression Statement Patient educated on probable soreness in relation to new exercises completed last session. Continued with glute and quad strengthening today and patient given intermittent cueing for glute activation. He requires bilateral UE support for vectors secondary to impaired glute strength and balance. Educated on benefits of cardio for overall fitness/weight loss. Patient will continue to benefit from skilled physical therapy in order to reduce impairment and improve function.    Personal Factors and Comorbidities Comorbidity 1    Comorbidities lumbar fusion L4/5 2015, overweight    Examination-Activity Limitations Sit;Sleep;Bed Mobility;Bend;Squat;Stairs;Carry;Stand;Transfers;Dressing;Locomotion Level;Lift    Examination-Participation Restrictions Meal Prep;Yard Work;Driving;Community Activity;Shop;Cleaning    Stability/Clinical Decision Making Stable/Uncomplicated    Rehab Potential Good    PT Frequency 2x / week    PT Duration 6 weeks    PT Treatment/Interventions ADLs/Self Care Home Management;Aquatic Therapy;Cryotherapy;Electrical Stimulation;Moist Heat;Therapeutic activities;Iontophoresis 4mg /ml Dexamethasone;Therapeutic exercise;Traction;Balance training;Functional mobility training;Stair training;Gait training;DME Instruction;Neuromuscular re-education;Patient/family education;Manual techniques;Dry  needling;Passive range of motion;Joint Manipulations    PT Next Visit Plan progress stab exercises and stretches; Glute strength; possibly try cardio machines for patient to perform at [place] unilateral bent knee fall outs, glute isometric; 9/1:  POE, press up , prone glut sets 9/7 hip extension, clam 9/12 resistance for clam, standing hip abduction 9/22 mini squat, marching with glute activation 9/26 step up fwd/lat    Consulted and Agree with Plan of Care Patient             Patient will benefit from skilled therapeutic intervention in order to improve the following deficits and impairments:  Pain, Decreased strength, Decreased activity tolerance, Decreased mobility, Decreased balance, Difficulty walking, Decreased range of motion, Decreased endurance  Visit Diagnosis: Pain in left hip  Difficulty in walking, not elsewhere classified  Muscle weakness (generalized)     Problem List Patient Active Problem List   Diagnosis Date Noted   ILD (interstitial lung disease) ([facility]) 05/18/2019   Shortness of breath 05/18/2019   Allergic rhinitis 05/18/2019   Atypical chest pain 05/04/2014   Sleep apnea 05/04/2014   Abdominal bloating 07/22/2013   Spermatocele 05/02/2013   Chronic prostatitis 01/31/2013   Pain in testicle 01/31/2013   Weakness of left leg 08/12/2012   Generalized abdominal pain 09/02/2011   Lumbar canal stenosis 05/28/2011   Degenerative arthritis of lumbar spine 05/28/2011   SPL (spondylolisthesis) 05/28/2011   PERSONAL HX COLONIC POLYPS 04/19/2009   IRRITABLE BOWEL SYNDROME 04/06/2008   ABDOMINAL BLOATING 03/07/2008   CHANGE IN BOWELS 03/07/2008   ABDOMINAL PAIN-MULTIPLE SITES 03/07/2008   Essential hypertension 03/06/2008   GERD 03/06/2008   GASTRITIS 03/06/2008   CHRONIC RESPIRATORY DISEASE ARISE PERINTL PERIOD 03/06/2008   HYPERLIPIDEMIA 02/06/2003    8:29 AM, 10/29/20 Mearl Latin PT, DPT Physical Therapist at [place], [place], [address] Phone: [phone]   Fax:  [phone]  Name: Alexander Nunez MRN: [id number] Date of Birth: 1945/11/16

## 2020-10-29 NOTE — Patient Instructions (Signed)
Access Code: [email or web address] URL: [email or web address] Date: 10/29/2020 Prepared by: Mitzi Hansen Sion Thane  Exercises Step Up - 1 x daily - 7 x weekly - 3 sets - 10 reps Lateral Step Up - 1 x daily - 7 x weekly - 3 sets - 10 reps

## 2020-10-31 ENCOUNTER — Telehealth (HOSPITAL_COMMUNITY): Payer: Self-pay | Admitting: Physical Therapy

## 2020-10-31 NOTE — Telephone Encounter (Signed)
He has injured his foot steping up on that box and will take a break and return for PT on Monday 10/3.

## 2020-11-01 ENCOUNTER — Encounter (HOSPITAL_COMMUNITY): Payer: Medicare HMO | Admitting: Physical Therapy

## 2020-11-05 ENCOUNTER — Encounter (HOSPITAL_COMMUNITY): Payer: Self-pay | Admitting: Physical Therapy

## 2020-11-05 ENCOUNTER — Other Ambulatory Visit: Payer: Self-pay

## 2020-11-05 ENCOUNTER — Ambulatory Visit (HOSPITAL_COMMUNITY): Payer: Medicare HMO | Attending: Orthopedic Surgery | Admitting: Physical Therapy

## 2020-11-05 DIAGNOSIS — M25552 Pain in left hip: Secondary | ICD-10-CM | POA: Insufficient documentation

## 2020-11-05 DIAGNOSIS — R262 Difficulty in walking, not elsewhere classified: Secondary | ICD-10-CM | POA: Diagnosis not present

## 2020-11-05 DIAGNOSIS — M6281 Muscle weakness (generalized): Secondary | ICD-10-CM | POA: Diagnosis not present

## 2020-11-05 NOTE — Patient Instructions (Signed)
Access Code: [email or web address] URL: [email or web address] Date: 11/05/2020 Prepared by: Mitzi Hansen Angelino Rumery  Exercises Side Stepping with Resistance at [employer] - 1 x daily - 7 x weekly - 4 sets - 10 reps Lateral Step Down - 1 x daily - 7 x weekly - 2 sets - 10 reps Single Leg Balance with Clock Reach - 1 x daily - 7 x weekly - 5 reps - 5 second hold

## 2020-11-05 NOTE — Therapy (Signed)
[place], [place], [address] Phone: [phone]   Fax:  [phone]  Physical Therapy Treatment  Patient Details  Name: Alexander Nunez MRN: [id number] Date of Birth: 03/10/1945 No data recorded  Encounter Date: 11/05/2020   PT End of Session - 11/05/20 0751     Visit Number 8    Number of Visits 12    Date for PT Re-Evaluation 11/14/20    Authorization Type [employer] Medicare HMO - no auth - visits med. nec.    Progress Note Due on Visit 10    PT Start Time [phone]    PT Stop Time 0830    PT Time Calculation (min) 39 min    Activity Tolerance Patient tolerated treatment well    Behavior During Therapy WFL for tasks assessed/performed             Past Medical History:  Diagnosis Date   Anxiety    Arthritis    back   Back pain, chronic    Colon polyp    Diverticulitis    GERD (gastroesophageal reflux disease)    Heart palpitations    High cholesterol    Hypertension    Irritable bowel syndrome    Osteoporosis    Psoriasis    Sleep apnea    c- pap    Past Surgical History:  Procedure Laterality Date   BACK SURGERY  2015   CHOLECYSTECTOMY N/A 10/12/2015   Procedure: LAPAROSCOPIC CHOLECYSTECTOMY;  Surgeon: Aviva Signs, MD;  Location: [facility];  Service: General;  Laterality: N/A;   COLONOSCOPY     FOOT FRACTURE SURGERY     left foot   KNEE ARTHROSCOPY     left   TONSILLECTOMY     UPPER GASTROINTESTINAL ENDOSCOPY      There were no vitals filed for this visit.   Subjective Assessment - 11/05/20 0751     Subjective Patient states he is stiff today. He has been sore after sessions. He has a trip coming up.    Pertinent History lumbar fusion 2015 L4-5    Currently in Pain? Yes    Pain Score 4     Pain Location Hip    Pain Orientation Left    Pain Descriptors / Indicators Sore    Pain Type Chronic pain    Pain Onset More than a month ago    Pain Frequency Intermittent    Aggravating Factors   movement                               [facility] Adult PT Treatment/Exercise - 11/05/20 0001       Knee/Hip Exercises: Standing   Lateral Step Up Left;2 sets;10 reps;Hand Hold: 2;Step Height: 4"    Lateral Step Up Limitations eccentric control    Functional Squat 2 sets;15 reps    Functional Squat Limitations with UE support    SLS with Vectors 5x 5 second holds bilateral    Other Standing Knee Exercises lateral stepping 4x 10 feet 2 sets green band at knees    Other Standing Knee Exercises palof in tandem red band 2x 10 bilateral                     PT Education - 11/05/20 0751     Education Details HEP, cardio/gym machines, gradually progressing activity    Person(s) Educated Patient    Methods Explanation;Demonstration;Handout  Comprehension Verbalized understanding;Returned demonstration              PT Short Term Goals - 10/04/20 0905       PT SHORT TERM GOAL #1   Title Patient will be able to demonstrate left hip ROM without > 3/10 pain    Time 3    Period Weeks    Status On-going    Target Date 10/24/20      PT SHORT TERM GOAL #2   Title Patient will report at least 50% improvement in overall symptoms and/or function to demonstrate improved functional mobility    Time 3    Period Weeks    Status On-going    Target Date 10/24/20      PT SHORT TERM GOAL #3   Title Patient will be independent in self management strategies to improve quality of life and functional outcomes.    Time 3    Period Weeks    Status On-going    Target Date 10/24/20      PT SHORT TERM GOAL #4   Status On-going      PT SHORT TERM GOAL #5   Status On-going               PT Long Term Goals - 10/04/20 0910       PT LONG TERM GOAL #1   Title Patient will report at least 75% improvement in overall symptoms and/or function to demonstrate improved functional mobility    Time 6    Period Weeks    Status On-going      PT LONG TERM GOAL #2    Title Patient will be able to ambulate without severe pain, limp or need of assistive device to demonstrate improved functional mobility    Time 6    Period Weeks    Status On-going      PT LONG TERM GOAL #3   Title Patient will mee predicted FOTO score to demonstrate improved overall function.    Time 6    Period Weeks    Status Not Met      PT LONG TERM GOAL #4   Title Patient will dmeonstrate increased hip internal rotation of 35 degrees in bilateral hips    Time 8    Period Weeks    Status On-going      PT LONG TERM GOAL #5   Title Patint will dmeosntrate independence with HEP    Time 8    Period Weeks    Status On-going                   Plan - 11/05/20 0751     Clinical Impression Statement Patient educated on proper gym cardio exercises and how to progress. Patient continues to require UE support for balance and is given intermittent cueing for glute activation on stance leg. Added palof in tandem for additional core and hip strengthening.  Patient with good mechanics with lateral step down demonstrating improving eccentric strength and control. Anticipate d/c next session to work on [place]. Patient will continue to benefit from skilled physical therapy in order to reduce impairment and improve function.    Personal Factors and Comorbidities Comorbidity 1    Comorbidities lumbar fusion L4/5 2015, overweight    Examination-Activity Limitations Sit;Sleep;Bed Mobility;Bend;Squat;Stairs;Carry;Stand;Transfers;Dressing;Locomotion Level;Lift    Examination-Participation Restrictions Meal Prep;Yard Work;Driving;Community Activity;Shop;Cleaning    Stability/Clinical Decision Making Stable/Uncomplicated    Rehab Potential Good    PT Frequency 2x / week  PT Duration 6 weeks    PT Treatment/Interventions ADLs/Self Care Home Management;Aquatic Therapy;Cryotherapy;Electrical Stimulation;Moist Heat;Therapeutic activities;Iontophoresis 4mg /ml Dexamethasone;Therapeutic  exercise;Traction;Balance training;Functional mobility training;Stair training;Gait training;DME Instruction;Neuromuscular re-education;Patient/family education;Manual techniques;Dry needling;Passive range of motion;Joint Manipulations    PT Next Visit Plan anticipate d/c, review goals, review HEP    PT Home Exercise Plan unilateral bent knee fall outs, glute isometric; 9/1:  POE, press up , prone glut sets 9/7 hip extension, clam 9/12 resistance for clam, standing hip abduction 9/22 mini squat, marching with glute activation 9/26 step up fwd/lat 10/3 lateral step down, lateral stepping, SLS vectors    Consulted and Agree with Plan of Care Patient             Patient will benefit from skilled therapeutic intervention in order to improve the following deficits and impairments:  Pain, Decreased strength, Decreased activity tolerance, Decreased mobility, Decreased balance, Difficulty walking, Decreased range of motion, Decreased endurance  Visit Diagnosis: Pain in left hip  Difficulty in walking, not elsewhere classified  Muscle weakness (generalized)     Problem List Patient Active Problem List   Diagnosis Date Noted   ILD (interstitial lung disease) ([place]) 05/18/2019   Shortness of breath 05/18/2019   Allergic rhinitis 05/18/2019   Atypical chest pain 05/04/2014   Sleep apnea 05/04/2014   Abdominal bloating 07/22/2013   Spermatocele 05/02/2013   Chronic prostatitis 01/31/2013   Pain in testicle 01/31/2013   Weakness of left leg 08/12/2012   Generalized abdominal pain 09/02/2011   Lumbar canal stenosis 05/28/2011   Degenerative arthritis of lumbar spine 05/28/2011   SPL (spondylolisthesis) 05/28/2011   PERSONAL HX COLONIC POLYPS 04/19/2009   IRRITABLE BOWEL SYNDROME 04/06/2008   ABDOMINAL BLOATING 03/07/2008   CHANGE IN BOWELS 03/07/2008   ABDOMINAL PAIN-MULTIPLE SITES 03/07/2008   Essential hypertension 03/06/2008   GERD 03/06/2008   GASTRITIS 03/06/2008   CHRONIC  RESPIRATORY DISEASE ARISE PERINTL PERIOD 03/06/2008   HYPERLIPIDEMIA 02/06/2003    8:34 AM, 11/05/20 Mearl Latin PT, DPT Physical Therapist at [facility] [place], [place], [address] Phone: [phone]   Fax:  [phone]  Name: Alexander Nunez MRN: [id number] Date of Birth: 03/06/1945

## 2020-11-06 ENCOUNTER — Other Ambulatory Visit: Payer: Self-pay

## 2020-11-06 ENCOUNTER — Ambulatory Visit
Admission: EM | Admit: 2020-11-06 | Discharge: 2020-11-06 | Disposition: A | Discharge status: Home / Self Care | Payer: Medicare HMO | Attending: Emergency Medicine | Admitting: Emergency Medicine

## 2020-11-06 DIAGNOSIS — K6289 Other specified diseases of anus and rectum: Secondary | ICD-10-CM | POA: Diagnosis not present

## 2020-11-06 MED ORDER — HYDROCORTISONE ACETATE 25 MG RE SUPP: 25.0000 mg | Freq: Two times a day (BID) | RECTAL | 0 refills | Status: DC

## 2020-11-06 NOTE — Discharge Instructions (Addendum)
Sitz baths, rinse, not wipe after having a bowel movement, continue stool softeners, increased fluids.  You can try the Anusol suppositories.  They will help with inflammation.

## 2020-11-06 NOTE — ED Triage Notes (Signed)
Patient presents to Urgent Care with complaints of hemorrhoids. He states treating with warm baths, hemorrhoids cream, and oxy with no relief. He states he has a hx of hemorrhoids. He travels to [facility] tomorrow here for eval before traveling.

## 2020-11-06 NOTE — ED Provider Notes (Signed)
HPI  SUBJECTIVE:  Alexander Nunez is a 75 y.o. male who presents with throbbing, burning, rectal pain for the past 2 weeks.  He denies having any masses externally.  He states symptoms started after having a large, hard bowel movement.  Denies recent issues with constipation.  No nausea, vomiting, fevers, abdominal pain, melena, hematochezia, bright red blood per rectum.  He has tried sitz baths, glycerin suppositories, Preparation H cream and oxycodone.  No alleviating factors.  Symptoms worse with having a bowel movement.  He denies foreign body insertion.  He has a past medical history of hypertension, IBS.  History of diabetes, colon/rectal cancer.  He had a normal colonoscopy 3 years ago.  EXB:MWUXL, Purcell Nails, MD   Past Medical History:  Diagnosis Date   Anxiety    Arthritis    back   Back pain, chronic    Colon polyp    Diverticulitis    GERD (gastroesophageal reflux disease)    Heart palpitations    High cholesterol    Hypertension    Irritable bowel syndrome    Osteoporosis    Psoriasis    Sleep apnea    c- pap    Past Surgical History:  Procedure Laterality Date   BACK SURGERY  2015   CHOLECYSTECTOMY N/A 10/12/2015   Procedure: LAPAROSCOPIC CHOLECYSTECTOMY;  Surgeon: Aviva Signs, MD;  Location: [facility];  Service: General;  Laterality: N/A;   COLONOSCOPY     FOOT FRACTURE SURGERY     left foot   KNEE ARTHROSCOPY     left   TONSILLECTOMY     UPPER GASTROINTESTINAL ENDOSCOPY      Family History  Problem Relation Age of Onset   Arrhythmia Mother        Atrial fib   Arrhythmia Sister        Atrial fib   Stroke Sister    Breast cancer Sister    Bladder Cancer Father            Colon cancer Neg Hx    Stomach cancer Neg Hx    Esophageal cancer Neg Hx    Pancreatic cancer Neg Hx    Prostate cancer Neg Hx    Rectal cancer Neg Hx     Social History   Tobacco Use   Smoking status: Former    Packs/day: 2.00    Years: 11.00    Pack years: 22.00    Types:  Cigarettes    Start date: 05/22/1962    Quit date: 05/21/1973    Years since quitting: 47.4   Smokeless tobacco: Never   Tobacco comments:    Quit 25 yrs now   Vaping Use   Vaping Use: Never used  Substance Use Topics   Alcohol use: Yes    Alcohol/week: 14.0 standard drinks    Types: 14 Shots of liquor per week    Comment: social   Drug use: No    No current facility-administered medications for this encounter.  Current Outpatient Medications:    hydrocortisone (ANUSOL-HC) 25 MG suppository, Place 1 suppository (25 mg total) rectally 2 (two) times daily., Disp: 12 suppository, Rfl: 0   albuterol (VENTOLIN HFA) 108 (90 Base) MCG/ACT inhaler, Inhale into the lungs as needed., Disp: , Rfl:    ALPRAZolam (XANAX) 0.5 MG tablet, Take 0.25-0.5 mg by mouth at bedtime as needed for sleep. , Disp: , Rfl:    aspirin EC 81 MG tablet, Take 81 mg by mouth daily., Disp: , Rfl:  doxazosin (CARDURA) 4 MG tablet, Take 4 mg by mouth at bedtime.  , Disp: , Rfl:    fluticasone (FLONASE) 50 MCG/ACT nasal spray, PLACE 1 SPRAY INTO BOTH NOSTRILS DAILY., Disp: 48 mL, Rfl: 2   lisinopril (PRINIVIL,ZESTRIL) 10 MG tablet, Take 10 mg by mouth every evening. , Disp: , Rfl:    metoprolol tartrate (LOPRESSOR) 25 MG tablet, Take 1 tablet (25 mg total) by mouth 2 (two) times daily., Disp: 60 tablet, Rfl: 0   pantoprazole (PROTONIX) 40 MG tablet, Take 40 mg by mouth daily., Disp: , Rfl:    pravastatin (PRAVACHOL) 20 MG tablet, Take 20 mg by mouth every evening. , Disp: , Rfl:    triamcinolone cream (KENALOG) 0.1 %, Apply 1 application topically 2 (two) times daily., Disp: 30 g, Rfl: 0  Allergies  Allergen Reactions   Cephalexin Other (See Comments)    fever   Contrast Media [Iodinated Diagnostic Agents] Hives and Rash    Had persistent rash with only 50mg  benadryl prior to spinal injection on 09/03/2017, try 13 hr prep in future.     ROS  As noted in HPI.   Physical Exam  BP 125/75 (BP Location: Right Arm)    Pulse 69   Temp 98.2 F (36.8 C) (Oral)   Resp 18   SpO2 93%   Constitutional: Well developed, well nourished, no acute distress Eyes:  EOMI, conjunctiva normal bilaterally HENT: Normocephalic, atraumatic,mucus membranes moist Respiratory: Normal inspiratory effort Cardiovascular: Normal rate GI: nondistended Rectal: No tenderness at the gluteal cleft.  No tender areas of induration or fluctuance on the buttocks, inside the cleft, or around the anus.  Normal external appearance of the rectum.  Questionable superficial fissure at the 6 o'clock position.  No external hemorrhoids.  Increased rectal tone.  Patient unable to tolerate digital rectal exam.  Anoscope not available here. skin: No rash, skin intact Musculoskeletal: no deformities Neurologic: Alert & oriented x 3, no focal neuro deficits Psychiatric: Speech and behavior appropriate   ED Course   Medications - No data to display  No orders of the defined types were placed in this encounter.   No results found for this or any previous visit (from the past 24 hour(s)). No results found.  ED Clinical Impression  1. Rectal pain      ED Assessment/Plan  Symptoms started after having a large, hard bowel movement.  Wonder if he does not have a rectal fissure.  No evidence of perianal or perirectal abscess.  Advised sitz bath's, MiraLAX, stool softeners, glycerin suppositories, will send home with Anusol.  He does have increased rectal tone, but this could be from another fissure that I was unable to visualize externally.  There are no anoscopes available in clinic, was not able to see if he has internal hemorrhoids. Follow-up with GI next week if still symptomatic.   ER return precautions given.  Discussed MDM, treatment plan, and plan for follow-up with patient. Discussed sn/sx that should prompt return to the ED. patient agrees with plan.   Meds ordered this encounter  Medications   hydrocortisone (ANUSOL-HC) 25 MG  suppository    Sig: Place 1 suppository (25 mg total) rectally 2 (two) times daily.    Dispense:  12 suppository    Refill:  0      *This clinic note was created using [employer]. Therefore, there may be occasional mistakes despite careful proofreading.  ?    Melynda Ripple, MD 11/06/20 1658

## 2020-11-08 ENCOUNTER — Encounter (HOSPITAL_COMMUNITY): Payer: Medicare HMO | Admitting: Physical Therapy

## 2020-11-12 ENCOUNTER — Encounter (HOSPITAL_COMMUNITY): Payer: Medicare HMO | Admitting: Physical Therapy

## 2020-11-15 ENCOUNTER — Other Ambulatory Visit: Payer: Self-pay

## 2020-11-15 ENCOUNTER — Encounter (HOSPITAL_COMMUNITY): Payer: Self-pay | Admitting: Physical Therapy

## 2020-11-15 ENCOUNTER — Ambulatory Visit (HOSPITAL_COMMUNITY): Payer: Medicare HMO | Admitting: Physical Therapy

## 2020-11-15 DIAGNOSIS — M25552 Pain in left hip: Secondary | ICD-10-CM | POA: Diagnosis not present

## 2020-11-15 DIAGNOSIS — M6281 Muscle weakness (generalized): Secondary | ICD-10-CM | POA: Diagnosis not present

## 2020-11-15 DIAGNOSIS — R262 Difficulty in walking, not elsewhere classified: Secondary | ICD-10-CM | POA: Diagnosis not present

## 2020-11-15 NOTE — Therapy (Signed)
[facility] [place], [place], [address] Phone: [phone]   Fax:  [phone]  Physical Therapy Treatment, and Discharge Note  Patient Details  Name: Alexander Nunez MRN: [id number] Date of Birth: 1945-04-05 No data recorded  PHYSICAL THERAPY DISCHARGE SUMMARY  Visits from Start of Care: 9  Current functional level related to goals / functional outcomes: See below   Remaining deficits: See below   Education / Equipment: See below   Patient agrees to discharge. Patient goals were partially met. Patient is being discharged due to being pleased with the current functional level.   Encounter Date: 11/15/2020   PT End of Session - 11/15/20 0751     Visit Number 9    Number of Visits 12    Date for PT Re-Evaluation 11/14/20    Authorization Type [employer] Medicare HMO - no auth - visits med. nec.    Progress Note Due on Visit 10    PT Start Time [phone]    PT Stop Time 0820    PT Time Calculation (min) 28 min    Activity Tolerance Patient tolerated treatment well    Behavior During Therapy WFL for tasks assessed/performed             Past Medical History:  Diagnosis Date   Anxiety    Arthritis    back   Back pain, chronic    Colon polyp    Diverticulitis    GERD (gastroesophageal reflux disease)    Heart palpitations    High cholesterol    Hypertension    Irritable bowel syndrome    Osteoporosis    Psoriasis    Sleep apnea    c- pap    Past Surgical History:  Procedure Laterality Date   BACK SURGERY  2015   CHOLECYSTECTOMY N/A 10/12/2015   Procedure: LAPAROSCOPIC CHOLECYSTECTOMY;  Surgeon: Aviva Signs, MD;  Location: [facility];  Service: General;  Laterality: N/A;   COLONOSCOPY     FOOT FRACTURE SURGERY     left foot   KNEE ARTHROSCOPY     left   TONSILLECTOMY     UPPER GASTROINTESTINAL ENDOSCOPY      There were no vitals filed for this visit.   Subjective Assessment - 11/15/20 0757      Subjective States that he has a little pain when he walks 5/10 in the left hip. Reports overall he feels about 75% better since the start of PT. States he was on a trip to [place] and he took a wrong step down the steps in his sons home and he has bee having some soreness in his hip. States it is better states that laying on his stomach and raising up his left leg causes pain (hip flexor/ITB).    Pertinent History lumbar fusion 2015 L4-5    Currently in Pain? Yes    Pain Score 5     Pain Location Hip    Pain Orientation Left    Pain Descriptors / Indicators Sore    Pain Onset More than a month ago                [facility] PT Assessment - 11/15/20 0001       Assessment   Medical Diagnosis left hip bursistis      Observation/Other Assessments   Focus on Therapeutic Outcomes (FOTO)  52% function   was 35% function, predicted 53% function     AROM   Right Hip Flexion 105  Right Hip External Rotation  45    Right Hip Internal Rotation  35    Left Hip Flexion 105   no pain   Left Hip External Rotation  45   no pain   Left Hip Internal Rotation  30   no pain                     POE x5 minutes Prone knee bends x10 10" holds B              PT Education - 11/15/20 0816     Education Details on current presentation, on FOTO score, on HEP , on POC and return to golf/OPT when able    Person(s) Educated Patient    Methods Explanation    Comprehension Verbalized understanding              PT Short Term Goals - 11/15/20 0758       PT SHORT TERM GOAL #1   Title Patient will be able to demonstrate left hip ROM without > 3/10 pain    Time 3    Period Weeks    Status On-going    Target Date 10/24/20      PT SHORT TERM GOAL #2   Title Patient will report at least 50% improvement in overall symptoms and/or function to demonstrate improved functional mobility    Time 3    Period Weeks    Status Achieved    Target Date 10/24/20      PT SHORT TERM GOAL  #3   Title Patient will be independent in self management strategies to improve quality of life and functional outcomes.    Time 3    Period Weeks    Status On-going    Target Date 10/24/20      PT SHORT TERM GOAL #4   Status On-going      PT SHORT TERM GOAL #5   Status On-going               PT Long Term Goals - 11/15/20 0758       PT LONG TERM GOAL #1   Title Patient will report at least 75% improvement in overall symptoms and/or function to demonstrate improved functional mobility    Time 6    Period Weeks    Status Achieved      PT LONG TERM GOAL #2   Title Patient will be able to ambulate without severe pain, limp or need of assistive device to demonstrate improved functional mobility    Baseline no longer using cane and no limp    Time 6    Period Weeks    Status Achieved      PT LONG TERM GOAL #3   Title Patient will mee predicted FOTO score to demonstrate improved overall function.    Time 6    Period Weeks    Status On-going      PT LONG TERM GOAL #4   Title Patient will dmeonstrate increased hip internal rotation of 35 degrees in bilateral hips    Time 8    Period Weeks    Status Partially Met      PT LONG TERM GOAL #5   Title Patint will dmeosntrate independence with HEP    Time 8    Period Weeks    Status On-going                   Plan - 11/15/20 4492  Clinical Impression Statement Patient is going to start going to the senior center and working out more. Reports he will continue to work on his exercises. Educated patient on use of ice/heat and stretching for left hip. Patient has met 1/3 short term goals, and 2/5 long term goals. Reviewed HEP and plan moving forward. Patient to discharge from PT to HEP secondary to satisfaction with current functional presentation.    Personal Factors and Comorbidities Comorbidity 1    Comorbidities lumbar fusion L4/5 2015, overweight    Examination-Activity Limitations Sit;Sleep;Bed  Mobility;Bend;Squat;Stairs;Carry;Stand;Transfers;Dressing;Locomotion Level;Lift    Examination-Participation Restrictions Meal Prep;Yard Work;Driving;Community Activity;Shop;Cleaning    Stability/Clinical Decision Making Stable/Uncomplicated    Rehab Potential Good    PT Frequency 2x / week    PT Duration 6 weeks    PT Treatment/Interventions ADLs/Self Care Home Management;Aquatic Therapy;Cryotherapy;Electrical Stimulation;Moist Heat;Therapeutic activities;Iontophoresis 45m/ml Dexamethasone;Therapeutic exercise;Traction;Balance training;Functional mobility training;Stair training;Gait training;DME Instruction;Neuromuscular re-education;Patient/family education;Manual techniques;Dry needling;Passive range of motion;Joint Manipulations    PT Next Visit Plan DC to HEP    PT Home Exercise Plan unilateral bent knee fall outs, glute isometric; 9/1:  POE, press up , prone glut sets 9/7 hip extension, clam 9/12 resistance for clam, standing hip abduction 9/22 mini squat, marching with glute activation 9/26 step up fwd/lat 10/3 lateral step down, lateral stepping, SLS vectors    Consulted and Agree with Plan of Care Patient             Patient will benefit from skilled therapeutic intervention in order to improve the following deficits and impairments:  Pain, Decreased strength, Decreased activity tolerance, Decreased mobility, Decreased balance, Difficulty walking, Decreased range of motion, Decreased endurance  Visit Diagnosis: Pain in left hip  Difficulty in walking, not elsewhere classified  Muscle weakness (generalized)     Problem List Patient Active Problem List   Diagnosis Date Noted   ILD (interstitial lung disease) ([place] 05/18/2019   Shortness of breath 05/18/2019   Allergic rhinitis 05/18/2019   Atypical chest pain 05/04/2014   Sleep apnea 05/04/2014   Abdominal bloating 07/22/2013   Spermatocele 05/02/2013   Chronic prostatitis 01/31/2013   Pain in testicle 01/31/2013    Weakness of left leg 08/12/2012   Generalized abdominal pain 09/02/2011   Lumbar canal stenosis 05/28/2011   Degenerative arthritis of lumbar spine 05/28/2011   SPL (spondylolisthesis) 05/28/2011   PERSONAL HX COLONIC POLYPS 04/19/2009   IRRITABLE BOWEL SYNDROME 04/06/2008   ABDOMINAL BLOATING 03/07/2008   CHANGE IN BOWELS 03/07/2008   ABDOMINAL PAIN-MULTIPLE SITES 03/07/2008   Essential hypertension 03/06/2008   GERD 03/06/2008   GASTRITIS 03/06/2008   CHRONIC RESPIRATORY DISEASE ARISE PERINTL PERIOD 03/06/2008   HYPERLIPIDEMIA 02/06/2003   8:23 AM, 11/15/20 MJerene Pitch DPT [facility] [phone]   [place] [address]Phone: [phone]  Fax:  [phone] Name: Alexander KIMBERLINMRN: [id number] of Birth: 3Apr 29, 1947

## 2020-11-21 DIAGNOSIS — J8410 Pulmonary fibrosis, unspecified: Secondary | ICD-10-CM | POA: Diagnosis not present

## 2020-11-21 DIAGNOSIS — R69 Illness, unspecified: Secondary | ICD-10-CM | POA: Diagnosis not present

## 2020-11-21 DIAGNOSIS — I700 Atherosclerosis of aorta: Secondary | ICD-10-CM | POA: Diagnosis not present

## 2020-11-21 DIAGNOSIS — I2584 Coronary atherosclerosis due to calcified coronary lesion: Secondary | ICD-10-CM | POA: Diagnosis not present

## 2020-11-21 DIAGNOSIS — E669 Obesity, unspecified: Secondary | ICD-10-CM | POA: Diagnosis not present

## 2020-11-21 DIAGNOSIS — K589 Irritable bowel syndrome without diarrhea: Secondary | ICD-10-CM | POA: Diagnosis not present

## 2020-11-21 DIAGNOSIS — G4733 Obstructive sleep apnea (adult) (pediatric): Secondary | ICD-10-CM | POA: Diagnosis not present

## 2020-11-21 DIAGNOSIS — E042 Nontoxic multinodular goiter: Secondary | ICD-10-CM | POA: Diagnosis not present

## 2020-11-21 DIAGNOSIS — I10 Essential (primary) hypertension: Secondary | ICD-10-CM | POA: Diagnosis not present

## 2020-11-21 DIAGNOSIS — N411 Chronic prostatitis: Secondary | ICD-10-CM | POA: Diagnosis not present

## 2020-11-23 DIAGNOSIS — Z1331 Encounter for screening for depression: Secondary | ICD-10-CM | POA: Diagnosis not present

## 2020-11-23 DIAGNOSIS — E782 Mixed hyperlipidemia: Secondary | ICD-10-CM | POA: Diagnosis not present

## 2020-11-23 DIAGNOSIS — G894 Chronic pain syndrome: Secondary | ICD-10-CM | POA: Diagnosis not present

## 2020-11-23 DIAGNOSIS — Z0001 Encounter for general adult medical examination with abnormal findings: Secondary | ICD-10-CM | POA: Diagnosis not present

## 2020-11-23 DIAGNOSIS — R7989 Other specified abnormal findings of blood chemistry: Secondary | ICD-10-CM | POA: Diagnosis not present

## 2020-11-23 DIAGNOSIS — Z23 Encounter for immunization: Secondary | ICD-10-CM | POA: Diagnosis not present

## 2020-11-23 DIAGNOSIS — D649 Anemia, unspecified: Secondary | ICD-10-CM | POA: Diagnosis not present

## 2020-11-23 DIAGNOSIS — R69 Illness, unspecified: Secondary | ICD-10-CM | POA: Diagnosis not present

## 2020-11-23 DIAGNOSIS — J449 Chronic obstructive pulmonary disease, unspecified: Secondary | ICD-10-CM | POA: Diagnosis not present

## 2020-11-23 DIAGNOSIS — Z125 Encounter for screening for malignant neoplasm of prostate: Secondary | ICD-10-CM | POA: Diagnosis not present

## 2020-11-23 DIAGNOSIS — Z6841 Body Mass Index (BMI) 40.0 and over, adult: Secondary | ICD-10-CM | POA: Diagnosis not present

## 2020-11-23 DIAGNOSIS — I10 Essential (primary) hypertension: Secondary | ICD-10-CM | POA: Diagnosis not present

## 2020-11-23 DIAGNOSIS — E538 Deficiency of other specified B group vitamins: Secondary | ICD-10-CM | POA: Diagnosis not present

## 2020-11-23 DIAGNOSIS — F419 Anxiety disorder, unspecified: Secondary | ICD-10-CM | POA: Diagnosis not present

## 2020-11-29 DIAGNOSIS — E039 Hypothyroidism, unspecified: Secondary | ICD-10-CM | POA: Diagnosis not present

## 2020-12-20 DIAGNOSIS — N411 Chronic prostatitis: Secondary | ICD-10-CM | POA: Diagnosis not present

## 2020-12-20 DIAGNOSIS — N4340 Spermatocele of epididymis, unspecified: Secondary | ICD-10-CM | POA: Diagnosis not present

## 2020-12-31 ENCOUNTER — Ambulatory Visit: Payer: Medicare HMO | Admitting: Physician Assistant

## 2020-12-31 ENCOUNTER — Encounter: Payer: Self-pay | Admitting: Physician Assistant

## 2020-12-31 VITALS — BP 140/70 | HR 60 | Ht 71.0 in | Wt 279.4 lb

## 2020-12-31 DIAGNOSIS — K602 Anal fissure, unspecified: Secondary | ICD-10-CM | POA: Diagnosis not present

## 2020-12-31 DIAGNOSIS — K6289 Other specified diseases of anus and rectum: Secondary | ICD-10-CM | POA: Diagnosis not present

## 2020-12-31 MED ORDER — AMBULATORY NON FORMULARY MEDICATION: 1 refills | Status: DC

## 2020-12-31 NOTE — Progress Notes (Signed)
Chief Complaint: Rectal pain  HPI:     Alexander Nunez is a 75 year old Caucasian male with a past medical history as listed below including IBS, known to Dr. Fuller Plan, who presents to clinic today for complaint of rectal pain.    02/20/2017 colonoscopy with internal hemorrhoids and moderate diverticulosis in the left colon otherwise normal.  Repeat recommended in 5 years.    02/25/2019 patient seen in clinic and at that time described that he had a few flares of his IBS since being seen previously 10/14/2018.  At that time discussed that he had taken Flagyl twice daily for 7 days on 2 separate occasions for his IBS prescribed by his PCP.  The thought that possibly he had SIBO.  That time recommended that he try Cholestyramine 4 g packets twice daily when he starts with his next flare to see if it helped with diarrhea.    12/20/2020 patient saw urology for chronic recurrent prostatitis.  At that time had exam with tenderness in his rectal vault.  At that time was recommended that he do Sitz bath's and take it easy.    Today, the patient tells me that about 2 months ago he was seen at urgent care for a rectal pain/burning.  At that time they told him that he needed to follow-up with his GI physician.  He then saw his PCP who did some lab work and told him his hemoglobin was minimally decreased at 12.8, they followed this with an anemia panel, he tells me that his iron and B12 are fine.  He did take home some stool studies and tells me that 2 of them were positive for blood.  Describes that the pain seemed to subside slightly after getting some Hydrocortisone suppositories from the urgent care but over the past week it has increased/worsened.  Tells me that it burns and hurts severely when he is passing a stool, especially if they are little hard regardless of him taking MiraLAX and Stool softeners daily.  Tells me that now it even makes when he is sitting still.  He does see occasional bright red blood in the toilet  paper.    Denies fever, chills, weight loss, nausea, vomiting or symptoms that awaken him from sleep.  Past Medical History:  Diagnosis Date   Anxiety    Arthritis    back   Back pain, chronic    Colon polyp    Diverticulitis    GERD (gastroesophageal reflux disease)    Heart palpitations    High cholesterol    Hypertension    Irritable bowel syndrome    Osteoporosis    Psoriasis    Sleep apnea    c- pap    Past Surgical History:  Procedure Laterality Date   BACK SURGERY  2015   CHOLECYSTECTOMY N/A 10/12/2015   Procedure: LAPAROSCOPIC CHOLECYSTECTOMY;  Surgeon: Aviva Signs, MD;  Location: [facility];  Service: General;  Laterality: N/A;   COLONOSCOPY     FOOT FRACTURE SURGERY     left foot   KNEE ARTHROSCOPY     left   TONSILLECTOMY     UPPER GASTROINTESTINAL ENDOSCOPY      Current Outpatient Medications  Medication Sig Dispense Refill   albuterol (VENTOLIN HFA) 108 (90 Base) MCG/ACT inhaler Inhale into the lungs as needed.     ALPRAZolam (XANAX) 0.5 MG tablet Take 0.25-0.5 mg by mouth at bedtime as needed for sleep.      aspirin EC 81 MG tablet Take 81  mg by mouth daily.     doxazosin (CARDURA) 4 MG tablet Take 4 mg by mouth at bedtime.       fluticasone (FLONASE) 50 MCG/ACT nasal spray PLACE 1 SPRAY INTO BOTH NOSTRILS DAILY. 48 mL 2   hydrocortisone (ANUSOL-HC) 25 MG suppository Place 1 suppository (25 mg total) rectally 2 (two) times daily. 12 suppository 0   lisinopril (PRINIVIL,ZESTRIL) 10 MG tablet Take 10 mg by mouth every evening.      metoprolol tartrate (LOPRESSOR) 25 MG tablet Take 1 tablet (25 mg total) by mouth 2 (two) times daily. 60 tablet 0   pantoprazole (PROTONIX) 40 MG tablet Take 40 mg by mouth daily.     pravastatin (PRAVACHOL) 20 MG tablet Take 20 mg by mouth every evening.      triamcinolone cream (KENALOG) 0.1 % Apply 1 application topically 2 (two) times daily. 30 g 0   No current facility-administered medications for this visit.    Allergies  as of 12/31/2020 - Review Complete 11/15/2020  Allergen Reaction Noted   Cephalexin Other (See Comments) 03/07/2008   Contrast media [iodinated diagnostic agents] Hives and Rash 09/17/2011    Family History  Problem Relation Age of Onset   Arrhythmia Mother        Atrial fib   Arrhythmia Sister        Atrial fib   Stroke Sister    Breast cancer Sister    Bladder Cancer Father            Colon cancer Neg Hx    Stomach cancer Neg Hx    Esophageal cancer Neg Hx    Pancreatic cancer Neg Hx    Prostate cancer Neg Hx    Rectal cancer Neg Hx     Social History   Socioeconomic History   Marital status: Married    Spouse name: Not on file   Number of children: 2   Years of education: Not on file   Highest education level: Not on file  Occupational History   Occupation: retired    Comment: Retired  Tobacco Use   Smoking status: Former    Packs/day: 2.00    Years: 11.00    Pack years: 22.00    Types: Cigarettes    Start date: 05/22/1962    Quit date: 05/21/1973    Years since quitting: 47.6   Smokeless tobacco: Never   Tobacco comments:    Quit 25 yrs now   Vaping Use   Vaping Use: Never used  Substance and Sexual Activity   Alcohol use: Yes    Alcohol/week: 14.0 standard drinks    Types: 14 Shots of liquor per week    Comment: social   Drug use: No   Sexual activity: Yes    Partners: Female  Other Topics Concern   Not on file  Social History Narrative   Has dog named Careers information officer   Social Determinants of Health   Financial Resource Strain: Not on file  Food Insecurity: Not on file  Transportation Needs: Not on file  Physical Activity: Not on file  Stress: Not on file  Social Connections: Not on file  Intimate Partner Violence: Not on file    Review of Systems:    Constitutional: No weight loss, fever or chills Cardiovascular: No chest pain Respiratory: No SOB  Gastrointestinal: See HPI and otherwise negative   Physical Exam:  Vital signs: BP 140/70    Pulse 60   Ht 5\' 11"  (1.803 m)   Wt  279 lb 6 oz (126.7 kg)   BMI 38.96 kg/m    Constitutional:   Pleasant overweight Caucasian male appears to be in NAD, Well developed, Well nourished, alert and cooperative  Respiratory: Respirations even and unlabored. Lungs clear to auscultation bilaterally.   No wheezes, crackles, or rhonchi.  Cardiovascular: Normal S1, S2. No MRG. Regular rate and rhythm. No peripheral edema, cyanosis or pallor.  Gastrointestinal:  Soft, nondistended, nontender. No rebound or guarding. Normal bowel sounds. No appreciable masses or hepatomegaly. Rectal: External exam: Deep posterior fissure, very tender; internal exam: Deferred due to pain Psychiatric: Demonstrates good judgement and reason without abnormal affect or behaviors.  See HPI for recent labs.  Assessment: 1.  Anal fissure: With pain and some bleeding over the past 2 months, deep and posterior seen on exam today, very tender to palpation  Plan: 1.  Prescribed Diltiazem ointment to be applied 3 times daily x6-8 weeks.  This was sent to patient's compounding pharmacy in [place]. 2.  Recommend sitz bath's for 15 to 20 minutes at a time. 3.  Use over-the-counter RectiCare cream with Lidocaine as needed for pain 4.  Recommend the patient keep his stools soft, continue stool softeners at least 2 a day and MiraLAX as needed 5.  Patient to follow in clinic with [place] as needed.  Ellouise Newer, PA-C [facility] 12/31/2020, 8:56 AM  Cc: Redmond School, MD

## 2020-12-31 NOTE — Progress Notes (Signed)
Reviewed and agree with management plan.  Candida Vetter T. Shenoa Hattabaugh, MD FACG 

## 2020-12-31 NOTE — Patient Instructions (Addendum)
We have sent a prescription for Diltiazem 2% gel to [facility] for you. Using your index finger, you should apply a small amount of medication inside the rectum up to your first knuckle/joint three times daily x 6-8 weeks.  [place] information is below: Address: [address] [place], [place] [address] [phone]  *Please DO NOT go directly from our office to pick up this medication! Give the pharmacy 1 day to process the prescription as this is compounded and takes time to make.  Please purchase the following medications over the counter and take as directed: Recticare w/Lidocaine as needed for rectal pain.  Keep stools soft.   If you are age 48 or older, your body mass index should be between 23-30. Your Body mass index is 38.96 kg/m. If this is out of the aforementioned range listed, please consider follow up with your Primary Care Provider.  If you are age 70 or younger, your body mass index should be between 19-25. Your Body mass index is 38.96 kg/m. If this is out of the aformentioned range listed, please consider follow up with your Primary Care Provider.   ________________________________________________________  The [facility] providers would like to encourage you to use [facility] to communicate with providers for non-urgent requests or questions.  Due to long hold times on the telephone, sending your provider a message by [facility] may be a faster and more efficient way to get a response.  Please allow 48 business hours for a response.  Please remember that this is for non-urgent requests.  _______________________________________________________

## 2021-01-02 DIAGNOSIS — G4733 Obstructive sleep apnea (adult) (pediatric): Secondary | ICD-10-CM | POA: Diagnosis not present

## 2021-01-02 DIAGNOSIS — M1990 Unspecified osteoarthritis, unspecified site: Secondary | ICD-10-CM | POA: Diagnosis not present

## 2021-01-02 DIAGNOSIS — E039 Hypothyroidism, unspecified: Secondary | ICD-10-CM | POA: Diagnosis not present

## 2021-01-02 DIAGNOSIS — G8929 Other chronic pain: Secondary | ICD-10-CM | POA: Diagnosis not present

## 2021-01-02 DIAGNOSIS — Z7982 Long term (current) use of aspirin: Secondary | ICD-10-CM | POA: Diagnosis not present

## 2021-01-02 DIAGNOSIS — R69 Illness, unspecified: Secondary | ICD-10-CM | POA: Diagnosis not present

## 2021-01-02 DIAGNOSIS — G4700 Insomnia, unspecified: Secondary | ICD-10-CM | POA: Diagnosis not present

## 2021-01-02 DIAGNOSIS — N400 Enlarged prostate without lower urinary tract symptoms: Secondary | ICD-10-CM | POA: Diagnosis not present

## 2021-01-02 DIAGNOSIS — J8410 Pulmonary fibrosis, unspecified: Secondary | ICD-10-CM | POA: Diagnosis not present

## 2021-01-02 DIAGNOSIS — I499 Cardiac arrhythmia, unspecified: Secondary | ICD-10-CM | POA: Diagnosis not present

## 2021-01-02 DIAGNOSIS — J309 Allergic rhinitis, unspecified: Secondary | ICD-10-CM | POA: Diagnosis not present

## 2021-01-02 DIAGNOSIS — E785 Hyperlipidemia, unspecified: Secondary | ICD-10-CM | POA: Diagnosis not present

## 2021-01-02 DIAGNOSIS — K219 Gastro-esophageal reflux disease without esophagitis: Secondary | ICD-10-CM | POA: Diagnosis not present

## 2021-01-02 DIAGNOSIS — I10 Essential (primary) hypertension: Secondary | ICD-10-CM | POA: Diagnosis not present

## 2021-01-29 DIAGNOSIS — J329 Chronic sinusitis, unspecified: Secondary | ICD-10-CM | POA: Diagnosis not present

## 2021-02-11 ENCOUNTER — Other Ambulatory Visit: Payer: Self-pay

## 2021-02-11 ENCOUNTER — Encounter: Payer: Self-pay | Admitting: Dermatology

## 2021-02-11 ENCOUNTER — Ambulatory Visit: Payer: Medicare HMO | Admitting: Dermatology

## 2021-02-11 DIAGNOSIS — L409 Psoriasis, unspecified: Secondary | ICD-10-CM | POA: Diagnosis not present

## 2021-02-11 DIAGNOSIS — H01001 Unspecified blepharitis right upper eyelid: Secondary | ICD-10-CM

## 2021-02-11 DIAGNOSIS — H01004 Unspecified blepharitis left upper eyelid: Secondary | ICD-10-CM | POA: Diagnosis not present

## 2021-02-11 MED ORDER — CLOBETASOL PROP EMOLLIENT BASE 0.05 % EX CREA: TOPICAL_CREAM | CUTANEOUS | 4 refills | Status: DC

## 2021-02-11 MED ORDER — HYDROCORTISONE 2.5 % EX CREA: TOPICAL_CREAM | Freq: Two times a day (BID) | CUTANEOUS | 8 refills | Status: DC | PRN

## 2021-03-05 ENCOUNTER — Encounter: Payer: Self-pay | Admitting: Dermatology

## 2021-03-06 NOTE — Progress Notes (Signed)
° °  Follow-Up Visit   Subjective  Alexander Nunez is a 76 y.o. male who presents for the following: Skin Problem (Flaky skin over eyelids x months- tx- lotion).  Rash on torso, and eyelids Location:  Duration:  Quality:  Associated Signs/Symptoms: Modifying Factors:  Severity:  Timing: Context:   Objective  Well appearing patient in no apparent distress; mood and affect are within normal limits. Left Upper Eyelid Right eyelid less than left upper eyelid with patchy psoriasiform chronic dermatitis.  Mild intertrigo right more than left axilla.  Lichen simplex left wrist and lower waist.  Left Lower Back Psoriasiform patches    All skin waist up examined.   Assessment & Plan    Blepharitis of upper eyelids of both eyes, unspecified type Left Upper Eyelid  Hytone 2 and half percent ointment for eyelid and under arm (where there is also a patchy psoriasiform dermatitis)..  Clobetasol cream for lower back and wrist.  Apply daily presleep.  Follow-up by MyChart or telephone in 4 weeks.  hydrocortisone 2.5 % cream - Left Upper Eyelid Apply topically 2 (two) times daily as needed (Rash). Apply to eyelids & under arms qd  Psoriasis Left Lower Back  Clobetasol daily after bathing for 1 month  Clobetasol Prop Emollient Base 0.05 % emollient cream - Left Lower Back Apply to affected area qd- Do not apply to face or skin folds      I, Lavonna Monarch, MD, have reviewed all documentation for this visit.  The documentation on 03/06/21 for the exam, diagnosis, procedures, and orders are all accurate and complete.

## 2021-03-22 DIAGNOSIS — Z6841 Body Mass Index (BMI) 40.0 and over, adult: Secondary | ICD-10-CM | POA: Diagnosis not present

## 2021-03-22 DIAGNOSIS — R69 Illness, unspecified: Secondary | ICD-10-CM | POA: Diagnosis not present

## 2021-03-22 DIAGNOSIS — M7022 Olecranon bursitis, left elbow: Secondary | ICD-10-CM | POA: Diagnosis not present

## 2021-03-22 DIAGNOSIS — I10 Essential (primary) hypertension: Secondary | ICD-10-CM | POA: Diagnosis not present

## 2021-03-22 DIAGNOSIS — J849 Interstitial pulmonary disease, unspecified: Secondary | ICD-10-CM | POA: Diagnosis not present

## 2021-03-25 ENCOUNTER — Encounter (HOSPITAL_COMMUNITY): Payer: Self-pay | Admitting: *Deleted

## 2021-03-25 ENCOUNTER — Telehealth: Payer: Self-pay | Admitting: Cardiovascular Disease

## 2021-03-25 ENCOUNTER — Emergency Department (HOSPITAL_COMMUNITY): Payer: Medicare HMO

## 2021-03-25 ENCOUNTER — Observation Stay (HOSPITAL_COMMUNITY)
Admission: EM | Admit: 2021-03-25 | Discharge: 2021-03-26 | Disposition: A | Discharge status: Home / Self Care | Payer: Medicare HMO | Attending: Internal Medicine | Admitting: Internal Medicine

## 2021-03-25 DIAGNOSIS — Z20822 Contact with and (suspected) exposure to covid-19: Secondary | ICD-10-CM | POA: Insufficient documentation

## 2021-03-25 DIAGNOSIS — Z87891 Personal history of nicotine dependence: Secondary | ICD-10-CM | POA: Insufficient documentation

## 2021-03-25 DIAGNOSIS — R002 Palpitations: Secondary | ICD-10-CM

## 2021-03-25 DIAGNOSIS — J189 Pneumonia, unspecified organism: Principal | ICD-10-CM | POA: Insufficient documentation

## 2021-03-25 DIAGNOSIS — R0789 Other chest pain: Secondary | ICD-10-CM | POA: Diagnosis not present

## 2021-03-25 DIAGNOSIS — R079 Chest pain, unspecified: Secondary | ICD-10-CM | POA: Diagnosis present

## 2021-03-25 DIAGNOSIS — I3139 Other pericardial effusion (noninflammatory): Secondary | ICD-10-CM | POA: Diagnosis not present

## 2021-03-25 DIAGNOSIS — Z79899 Other long term (current) drug therapy: Secondary | ICD-10-CM | POA: Diagnosis not present

## 2021-03-25 DIAGNOSIS — R0602 Shortness of breath: Secondary | ICD-10-CM | POA: Insufficient documentation

## 2021-03-25 DIAGNOSIS — E785 Hyperlipidemia, unspecified: Secondary | ICD-10-CM | POA: Diagnosis present

## 2021-03-25 DIAGNOSIS — Z7982 Long term (current) use of aspirin: Secondary | ICD-10-CM | POA: Insufficient documentation

## 2021-03-25 DIAGNOSIS — I10 Essential (primary) hypertension: Secondary | ICD-10-CM | POA: Insufficient documentation

## 2021-03-25 DIAGNOSIS — I700 Atherosclerosis of aorta: Secondary | ICD-10-CM | POA: Diagnosis not present

## 2021-03-25 DIAGNOSIS — J439 Emphysema, unspecified: Secondary | ICD-10-CM | POA: Diagnosis not present

## 2021-03-25 DIAGNOSIS — K219 Gastro-esophageal reflux disease without esophagitis: Secondary | ICD-10-CM | POA: Diagnosis present

## 2021-03-25 LAB — BASIC METABOLIC PANEL
Anion gap: 6 (ref 5–15)
BUN: 21 mg/dL (ref 8–23)
CO2: 26 mmol/L (ref 22–32)
Calcium: 9.1 mg/dL (ref 8.9–10.3)
Chloride: 106 mmol/L (ref 98–111)
Creatinine, Ser: 1.28 mg/dL — ABNORMAL HIGH (ref 0.61–1.24)
GFR, Estimated: 59 mL/min — ABNORMAL LOW (ref >60)
Glucose, Bld: 97 mg/dL (ref 70–99)
Potassium: 3.9 mmol/L (ref 3.5–5.1)
Sodium: 138 mmol/L (ref 135–145)

## 2021-03-25 LAB — HEPATIC FUNCTION PANEL
ALT: 26 U/L (ref 0–44)
AST: 23 U/L (ref 15–41)
Albumin: 3.8 g/dL (ref 3.5–5.0)
Alkaline Phosphatase: 74 U/L (ref 38–126)
Bilirubin, Direct: 0.1 mg/dL (ref 0.0–0.2)
Indirect Bilirubin: 0.5 mg/dL (ref 0.3–0.9)
Total Bilirubin: 0.6 mg/dL (ref 0.3–1.2)
Total Protein: 7.1 g/dL (ref 6.5–8.1)

## 2021-03-25 LAB — CBC
HCT: 40 % (ref 39.0–52.0)
Hemoglobin: 13.1 g/dL (ref 13.0–17.0)
MCH: 30.7 pg (ref 26.0–34.0)
MCHC: 32.8 g/dL (ref 30.0–36.0)
MCV: 93.7 fL (ref 80.0–100.0)
Platelets: 281 10*3/uL (ref 150–400)
RBC: 4.27 MIL/uL (ref 4.22–5.81)
RDW: 14.2 % (ref 11.5–15.5)
WBC: 9.8 10*3/uL (ref 4.0–10.5)
nRBC: 0 % (ref 0.0–0.2)

## 2021-03-25 LAB — BRAIN NATRIURETIC PEPTIDE: B Natriuretic Peptide: 63 pg/mL (ref 0.0–100.0)

## 2021-03-25 LAB — TROPONIN I (HIGH SENSITIVITY)
Troponin I (High Sensitivity): 4 ng/L (ref <18)
Troponin I (High Sensitivity): 4 ng/L (ref <18)

## 2021-03-25 MED ORDER — NITROGLYCERIN 0.4 MG SL SUBL
0.4000 mg | SUBLINGUAL_TABLET | Freq: Once | SUBLINGUAL | Status: AC
  Administered 2021-03-25: 0.4 mg via SUBLINGUAL
  Filled 2021-03-25: qty 1

## 2021-03-25 MED ORDER — LEVOFLOXACIN IN D5W 750 MG/150ML IV SOLN
750.0000 mg | Freq: Once | INTRAVENOUS | Status: AC
  Administered 2021-03-25: 750 mg via INTRAVENOUS
  Filled 2021-03-25: qty 150

## 2021-03-25 MED ORDER — SODIUM CHLORIDE 0.9 % IV BOLUS
500.0000 mL | Freq: Once | INTRAVENOUS | Status: AC
  Administered 2021-03-25: 500 mL via INTRAVENOUS

## 2021-03-25 NOTE — Telephone Encounter (Signed)
Patient c/o Palpitations:  High priority if patient c/o lightheadedness, shortness of breath, or chest pain  How long have you had palpitations/irregular HR/ Afib? Are you having the symptoms now?  Palpitations have been going on the past 2-3 weeks (mainly during the night)  Are you currently experiencing lightheadedness, SOB or CP?  No   Do you have a history of afib (atrial fibrillation) or irregular heart rhythm?  Hx of palpitations   Have you checked your BP or HR? (document readings if available):  BP is normal, HR 60-75  Are you experiencing any other symptoms?  No

## 2021-03-25 NOTE — ED Provider Notes (Signed)
[facility] EMERGENCY DEPARTMENT Provider Note   CSN: [id number] Arrival date & time: 03/25/21  1829     History  Chief Complaint  Patient presents with   Chest Pain    Alexander Nunez is a 76 y.o. male.  Patient complains of chest pressure.  This has been going on since 3 this afternoon.  Patient has mild shortness of breath no sweating.  The history is provided by the patient and medical records. No language interpreter was used.  Chest Pain Pain location:  L chest Pain quality: aching   Pain radiates to:  Does not radiate Pain severity:  Moderate Onset quality:  Sudden Timing:  Intermittent Progression:  Waxing and waning Chronicity:  New Context: not breathing   Relieved by:  Nothing Associated symptoms: no abdominal pain, no back pain, no cough, no fatigue and no headache       Home Medications Prior to Admission medications   Medication Sig Start Date End Date Taking? Authorizing Provider  albuterol (VENTOLIN HFA) 108 (90 Base) MCG/ACT inhaler Inhale into the lungs as needed. 07/25/19   [provider]  ALPRAZolam Duanne Moron) 0.5 MG tablet Take 0.25-0.5 mg by mouth at bedtime as needed for sleep.     [provider]  AMBULATORY NON FORMULARY MEDICATION Medication Name: Diltiazem 2% - Using your index finger, apply a small amount of medication inside the rectum up to your first knuckle/joint three times daily x 6-8 weeks. 12/31/20   Levin Erp, PA  aspirin EC 81 MG tablet Take 81 mg by mouth daily.    [provider]  Clobetasol Prop Emollient Base 0.05 % emollient cream Apply to affected area qd- Do not apply to face or skin folds 02/11/21   Lavonna Monarch, MD  doxazosin (CARDURA) 4 MG tablet Take 4 mg by mouth at bedtime.      [provider]  fluticasone (FLONASE) 50 MCG/ACT nasal spray PLACE 1 SPRAY INTO BOTH NOSTRILS DAILY. 01/18/20   Lauraine Rinne, NP  hydrocortisone (ANUSOL-HC) 25 MG suppository Place 1 suppository (25  mg total) rectally 2 (two) times daily. 11/06/20   Melynda Ripple, MD  hydrocortisone 2.5 % cream Apply topically 2 (two) times daily as needed (Rash). Apply to eyelids & under arms qd 02/11/21   Lavonna Monarch, MD  lisinopril (PRINIVIL,ZESTRIL) 10 MG tablet Take 10 mg by mouth every evening.     [provider]  metoprolol tartrate (LOPRESSOR) 25 MG tablet Take 1 tablet (25 mg total) by mouth 2 (two) times daily. 05/05/14   Black, Lezlie Octave, NP  pantoprazole (PROTONIX) 40 MG tablet Take 40 mg by mouth daily.    [provider]  pravastatin (PRAVACHOL) 20 MG tablet Take 20 mg by mouth every evening.     [provider]  triamcinolone cream (KENALOG) 0.1 % Apply 1 application topically 2 (two) times daily. 10/23/19   Wurst, [place], PA-C      Allergies    Cephalexin and Contrast media [iodinated contrast media]    Review of Systems   Review of Systems  Constitutional:  Negative for appetite change and fatigue.  HENT:  Negative for congestion, ear discharge and sinus pressure.   Eyes:  Negative for discharge.  Respiratory:  Negative for cough.   Cardiovascular:  Positive for chest pain.  Gastrointestinal:  Negative for abdominal pain and diarrhea.  Genitourinary:  Negative for frequency and hematuria.  Musculoskeletal:  Negative for back pain.  Skin:  Negative for rash.  Neurological:  Negative for seizures and headaches.  Psychiatric/Behavioral:  Negative for hallucinations.    Physical Exam Updated Vital Signs BP 134/74    Pulse 60    Temp 97.7 F (36.5 C) (Oral)    Resp 20    SpO2 97%  Physical Exam Vitals and nursing note reviewed.  Constitutional:      Appearance: He is well-developed.  HENT:     Head: Normocephalic.     Nose: Nose normal.  Eyes:     General: No scleral icterus.    Conjunctiva/sclera: Conjunctivae normal.  Neck:     Thyroid: No thyromegaly.  Cardiovascular:     Rate and Rhythm: Normal rate and regular rhythm.     Heart sounds: No  murmur heard.   No friction rub. No gallop.  Pulmonary:     Breath sounds: No stridor. No wheezing or rales.  Chest:     Chest wall: No tenderness.  Abdominal:     General: There is no distension.     Tenderness: There is no abdominal tenderness. There is no rebound.  Musculoskeletal:        General: Normal range of motion.     Cervical back: Neck supple.  Lymphadenopathy:     Cervical: No cervical adenopathy.  Skin:    Findings: No erythema or rash.  Neurological:     Mental Status: He is oriented to person, place, and time.     Motor: No abnormal muscle tone.     Coordination: Coordination normal.  Psychiatric:        Behavior: Behavior normal.    ED Results / Procedures / Treatments   Labs (all labs ordered are listed, but only abnormal results are displayed) Labs Reviewed  BASIC METABOLIC PANEL - Abnormal; Notable for the following components:      Result Value   Creatinine, Ser 1.28 (*)    GFR, Estimated 59 (*)    All other components within normal limits  RESP PANEL BY RT-PCR (FLU A&B, COVID) ARPGX2  CBC  HEPATIC FUNCTION PANEL  BRAIN NATRIURETIC PEPTIDE  TROPONIN I (HIGH SENSITIVITY)  TROPONIN I (HIGH SENSITIVITY)    EKG EKG Interpretation  Date/Time:  Monday March 25 2021 18:48:15 EST Ventricular Rate:  75 PR Interval:  168 QRS Duration: 102 QT Interval:  384 QTC Calculation: 428 R Axis:   36 Text Interpretation: Sinus rhythm with marked sinus arrhythmia Incomplete right bundle branch block Borderline ECG When compared with ECG of 11-Jul-2017 09:45, PREVIOUS ECG IS PRESENT Confirmed by Milton Ferguson [phone]) on 03/25/2021 7:27:37 PM  Radiology DG Chest 2 View  Result Date: 03/25/2021 CLINICAL DATA:  Chest pressure and palpitations. EXAM: CHEST - 2 VIEW COMPARISON:  None. FINDINGS: Moderate to marked severity diffuse, chronic appearing increased interstitial lung markings are seen. Superimposed component of atelectasis and/or infiltrate are noted  within the bilateral lung bases and mid to upper right lung. There is no evidence of a pleural effusion or pneumothorax. The heart size and mediastinal contours are within normal limits. Surgical clips are seen within the right upper quadrant. Degenerative changes are noted throughout the thoracic spine. IMPRESSION: Chronic appearing increased interstitial lung markings with superimposed bibasilar and mid to upper right lung atelectasis and/or infiltrate. Electronically Signed   By: Virgina Norfolk M.D.   On: 03/25/2021 19:19   CT Chest Wo Contrast  Result Date: 03/25/2021 CLINICAL DATA:  Abnormal CXR with chest pressure. Suspected mid to upper right lung atelectasis or pneumonic infiltrate. EXAM: CT CHEST WITHOUT CONTRAST TECHNIQUE:  Multidetector CT imaging of the chest was performed following the standard protocol without IV contrast. RADIATION DOSE REDUCTION: This exam was performed according to the departmental dose-optimization program which includes automated exposure control, adjustment of the mA and/or kV according to patient size and/or use of iterative reconstruction technique. COMPARISON:  Chest CT without contrast dated 10/05/2020 and 09/19/2019 FINDINGS: Cardiovascular: There is mild cardiomegaly. There is minimal pericardial effusion. There are scattered three-vessel coronary artery calcifications, prominent pulmonary trunk 3.4 cm again indicating arterial hypertension. There is mild aortic atherosclerosis. Ectatic ascending segment 3.8 cm with remainder within normal caliber limits and mild calcifications in the branch arteries with descending tortuosity. Central pulmonary veins are normal caliber. Mediastinum/Nodes: 4 cm mass right lobe of thyroid with few dystrophic calcifications anteriorly is unchanged. There is increased prominence of mediastinal nodes, for example pretracheal lymph node right of midline measures 1.4 cm in short axis on series 2 axial 47, previously 0.8 cm. Left-sided  prevascular space index node is now 1.1 cm in short axis, previously 0.8 cm. Right paratracheal lymph node is now 1.1 cm on axial 28, previously 0.8 cm. Subcarinal nodal conglomerate to the right measuring 1.6 cm in short axis on axial 76, previously 1.1 cm. No significant hilar adenopathy is suspected. Axillary spaces clear. The esophagus is unremarkable. Lungs/Pleura: There is no pleural effusion, thickening or pneumothorax. There are minimal paraseptal emphysematous changes in the lung apices. Patchy subpleural reticulation of the lung fields with a predominance in the upper zones continues to be noted with additional peribronchovascular reticulation and with mild features of honeycombing laterally in the upper lobes with traction bronchiolectasis. Today there are increased patchy ground-glass opacities in the left lower and right upper lobes. There are superimposed chronic ground-glass interstitial changes in portions of all pulmonary lobes. Central airways are clear. Upper Abdomen: There are calcified granulomas in the liver. Old cholecystectomy. No acute abnormality is seen without contrast. Musculoskeletal: There is bridging enthesopathy in the lower thoracic spine of DISH. No destructive osseous or lytic lesions. IMPRESSION: 1. Fibrotic interstitial lung disease, with superimposed increased ground-glass opacities in the right upper and left lower lobes most likely due to multilobar pneumonia. 2. Increased prominence of mediastinal lymph nodes with examples given above. Most likely this is reactive adenopathy. A follow-up study is recommended after treatment. 3. Cardiomegaly with prominent pulmonary trunk, borderline aneurysmal ascending aorta, with aortic and great vessel atherosclerosis and calcific CAD. 4. 4 cm right thyroid mass. Last thyroid ultrasound in our system dated 06/06/2019. Follow-up ultrasound suggested. Electronically Signed   By: Telford Nab M.D.   On: 03/25/2021 23:27     Procedures Procedures    Medications Ordered in ED Medications  levofloxacin (LEVAQUIN) IVPB 750 mg (has no administration in time range)  sodium chloride 0.9 % bolus 500 mL (0 mLs Intravenous Stopped 03/25/21 2336)  nitroGLYCERIN (NITROSTAT) SL tablet 0.4 mg (0.4 mg Sublingual Given 03/25/21 2221)    ED Course/ Medical Decision Making/ A&P Patient with chest pressure that was helped by nitroglycerin.  He has 2 troponins that were negative and an EKG that does not show ST changes.  Patient does have risk factors of hypertension obesity and hyperlipidemia.  CT scan of the chest also shows multifocal pneumonia                         Medical Decision Making Amount and/or Complexity of Data Reviewed Labs: ordered. Radiology: ordered.  Risk Prescription drug management. Decision regarding hospitalization.  Patient with multifocal pneumonia and chest pain concerning for coronary artery disease.  He will be admitted to medicine   This patient presents to the ED for concern of chest pain, this involves an extensive number of treatment options, and is a complaint that carries with it a high risk of complications and morbidity.  The differential diagnosis includes MI, pneumonia, PE   Co morbidities that complicate the patient evaluation  Hypertension and obesity   Additional history obtained:  Additional history obtained from patient External records from outside source obtained and reviewed including hospital rec   Lab Tests:  I Ordered, and personally interpreted labs.  The pertinent results include: CBC and chemistry which were unremarkable   Imaging Studies ordered:  I ordered imaging studies including CT chest I independently visualized and interpreted imaging which showed pneumonia I agree with the radiologist interpretation   Cardiac Monitoring:  The patient was maintained on a cardiac monitor.  I personally viewed and interpreted the cardiac monitored which  showed an underlying rhythm of: Normal sinus rhythm   Medicines ordered and prescription drug management:  I ordered medication including Levaquin for pneumonia Reevaluation of the patient after these medicines showed that the patient stayed the same I have reviewed the patients home medicines and have made adjustments as needed   Test Considered:  None    Critical Interventions:  IV antibiotic   Consultations Obtained:  I requested consultation with the hospitalist,  and discussed lab and imaging findings as well as pertinent plan - they recommend: Admit for IV antibiotics and further evaluation of chest pain   Problem List / ED Course:  Chest pain and pneumonia   Reevaluation:  After the interventions noted above, I reevaluated the patient and found that they have :stayed the same   Social Determinants of Health:  None   Dispostion:  After consideration of the diagnostic results and the patients response to treatment, I feel that the patent would benefit from admitted with IV antibiotics and evaluation for chest pain further.         Final Clinical Impression(s) / ED Diagnoses Final diagnoses:  None    Rx / DC Orders ED Discharge Orders     None         Milton Ferguson, MD 03/26/21 1737

## 2021-03-25 NOTE — Telephone Encounter (Signed)
Not seen since 07/26/2019.  Office visit scheduled for 05/13/2021 with Dr. Johnsie Cancel.

## 2021-03-25 NOTE — ED Triage Notes (Signed)
Pressure in chest all day

## 2021-03-26 ENCOUNTER — Other Ambulatory Visit: Payer: Self-pay

## 2021-03-26 ENCOUNTER — Observation Stay (HOSPITAL_BASED_OUTPATIENT_CLINIC_OR_DEPARTMENT_OTHER): Payer: Medicare HMO

## 2021-03-26 ENCOUNTER — Encounter (HOSPITAL_COMMUNITY): Payer: Self-pay | Admitting: Family Medicine

## 2021-03-26 DIAGNOSIS — I10 Essential (primary) hypertension: Secondary | ICD-10-CM | POA: Diagnosis not present

## 2021-03-26 DIAGNOSIS — E785 Hyperlipidemia, unspecified: Secondary | ICD-10-CM

## 2021-03-26 DIAGNOSIS — R079 Chest pain, unspecified: Secondary | ICD-10-CM

## 2021-03-26 DIAGNOSIS — J189 Pneumonia, unspecified organism: Secondary | ICD-10-CM | POA: Diagnosis not present

## 2021-03-26 DIAGNOSIS — K219 Gastro-esophageal reflux disease without esophagitis: Secondary | ICD-10-CM

## 2021-03-26 DIAGNOSIS — R072 Precordial pain: Secondary | ICD-10-CM

## 2021-03-26 LAB — COMPREHENSIVE METABOLIC PANEL
ALT: 23 U/L (ref 0–44)
AST: 20 U/L (ref 15–41)
Albumin: 3.3 g/dL — ABNORMAL LOW (ref 3.5–5.0)
Alkaline Phosphatase: 61 U/L (ref 38–126)
Anion gap: 6 (ref 5–15)
BUN: 19 mg/dL (ref 8–23)
CO2: 25 mmol/L (ref 22–32)
Calcium: 9 mg/dL (ref 8.9–10.3)
Chloride: 109 mmol/L (ref 98–111)
Creatinine, Ser: 1.07 mg/dL (ref 0.61–1.24)
GFR, Estimated: >60 mL/min (ref >60)
Glucose, Bld: 98 mg/dL (ref 70–99)
Potassium: 3.8 mmol/L (ref 3.5–5.1)
Sodium: 140 mmol/L (ref 135–145)
Total Bilirubin: 0.8 mg/dL (ref 0.3–1.2)
Total Protein: 6.4 g/dL — ABNORMAL LOW (ref 6.5–8.1)

## 2021-03-26 LAB — HIV ANTIBODY (ROUTINE TESTING W REFLEX): HIV Screen 4th Generation wRfx: NONREACTIVE

## 2021-03-26 LAB — MAGNESIUM: Magnesium: 1.7 mg/dL (ref 1.7–2.4)

## 2021-03-26 LAB — ECHOCARDIOGRAM COMPLETE
AR max vel: 3.62 cm2
AV Area VTI: 3.33 cm2
AV Area mean vel: 3.07 cm2
AV Mean grad: 5 mmHg
AV Peak grad: 8.9 mmHg
Ao pk vel: 1.49 m/s
Area-P 1/2: 2.49 cm2
Calc EF: 56.6 %
Height: 71 in
MV VTI: 4.66 cm2
S' Lateral: 2.8 cm
Single Plane A2C EF: 44.1 %
Single Plane A4C EF: 65.1 %
Weight: 4512 oz

## 2021-03-26 LAB — CBC
HCT: 37.3 % — ABNORMAL LOW (ref 39.0–52.0)
Hemoglobin: 12.2 g/dL — ABNORMAL LOW (ref 13.0–17.0)
MCH: 30.8 pg (ref 26.0–34.0)
MCHC: 32.7 g/dL (ref 30.0–36.0)
MCV: 94.2 fL (ref 80.0–100.0)
Platelets: 263 10*3/uL (ref 150–400)
RBC: 3.96 MIL/uL — ABNORMAL LOW (ref 4.22–5.81)
RDW: 14 % (ref 11.5–15.5)
WBC: 8.1 10*3/uL (ref 4.0–10.5)
nRBC: 0 % (ref 0.0–0.2)

## 2021-03-26 LAB — LIPID PANEL
Cholesterol: 140 mg/dL (ref 0–200)
HDL: 43 mg/dL (ref >40)
LDL Cholesterol: 83 mg/dL (ref 0–99)
Total CHOL/HDL Ratio: 3.3 RATIO
Triglycerides: 71 mg/dL (ref <150)
VLDL: 14 mg/dL (ref 0–40)

## 2021-03-26 LAB — RESP PANEL BY RT-PCR (FLU A&B, COVID) ARPGX2
Influenza A by PCR: NEGATIVE
Influenza B by PCR: NEGATIVE
SARS Coronavirus 2 by RT PCR: NEGATIVE

## 2021-03-26 LAB — TSH: TSH: 0.638 u[IU]/mL (ref 0.350–4.500)

## 2021-03-26 MED ORDER — FLUTICASONE PROPIONATE 50 MCG/ACT NA SUSP
1.0000 | Freq: Every day | NASAL | Status: DC
  Administered 2021-03-26: 1 via NASAL
  Filled 2021-03-26: qty 16

## 2021-03-26 MED ORDER — LEVOFLOXACIN 750 MG PO TABS: 750.0000 mg | ORAL_TABLET | Freq: Every day | ORAL | 0 refills | Status: AC

## 2021-03-26 MED ORDER — ALBUTEROL SULFATE HFA 108 (90 BASE) MCG/ACT IN AERS: 1.0000 | INHALATION_SPRAY | RESPIRATORY_TRACT | 0 refills | Status: DC | PRN

## 2021-03-26 MED ORDER — PANTOPRAZOLE SODIUM 40 MG PO TBEC
40.0000 mg | DELAYED_RELEASE_TABLET | Freq: Every day | ORAL | Status: DC
  Administered 2021-03-26: 40 mg via ORAL
  Filled 2021-03-26: qty 1

## 2021-03-26 MED ORDER — SODIUM CHLORIDE 0.9 % IV SOLN: 500.0000 mg | INTRAVENOUS | Status: DC

## 2021-03-26 MED ORDER — DOXAZOSIN MESYLATE 2 MG PO TABS: 4.0000 mg | ORAL_TABLET | Freq: Every day | ORAL | Status: DC

## 2021-03-26 MED ORDER — METOPROLOL TARTRATE 25 MG PO TABS
25.0000 mg | ORAL_TABLET | Freq: Two times a day (BID) | ORAL | Status: DC
  Administered 2021-03-26: 25 mg via ORAL
  Filled 2021-03-26 (×2): qty 1

## 2021-03-26 MED ORDER — PRAVASTATIN SODIUM 10 MG PO TABS: 20.0000 mg | ORAL_TABLET | Freq: Every evening | ORAL | Status: DC

## 2021-03-26 MED ORDER — MAGNESIUM SULFATE 2 GM/50ML IV SOLN
2.0000 g | Freq: Once | INTRAVENOUS | Status: AC
  Administered 2021-03-26: 2 g via INTRAVENOUS
  Filled 2021-03-26: qty 50

## 2021-03-26 MED ORDER — LISINOPRIL 10 MG PO TABS: 10.0000 mg | ORAL_TABLET | Freq: Every evening | ORAL | Status: DC

## 2021-03-26 MED ORDER — ASPIRIN EC 81 MG PO TBEC
81.0000 mg | DELAYED_RELEASE_TABLET | Freq: Every day | ORAL | Status: DC
  Administered 2021-03-26: 81 mg via ORAL
  Filled 2021-03-26: qty 1

## 2021-03-26 MED ORDER — ONDANSETRON HCL 4 MG/2ML IJ SOLN: 4.0000 mg | Freq: Four times a day (QID) | INTRAMUSCULAR | Status: DC | PRN

## 2021-03-26 MED ORDER — ALPRAZOLAM 0.25 MG PO TABS
0.2500 mg | ORAL_TABLET | Freq: Every evening | ORAL | Status: DC | PRN
  Administered 2021-03-26: 0.5 mg via ORAL
  Filled 2021-03-26: qty 2

## 2021-03-26 MED ORDER — POTASSIUM CHLORIDE CRYS ER 20 MEQ PO TBCR
40.0000 meq | EXTENDED_RELEASE_TABLET | Freq: Once | ORAL | Status: AC
  Administered 2021-03-26: 40 meq via ORAL
  Filled 2021-03-26: qty 2

## 2021-03-26 MED ORDER — HEPARIN SODIUM (PORCINE) 5000 UNIT/ML IJ SOLN
5000.0000 [IU] | Freq: Three times a day (TID) | INTRAMUSCULAR | Status: DC
  Administered 2021-03-26 (×2): 5000 [IU] via SUBCUTANEOUS
  Filled 2021-03-26 (×2): qty 1

## 2021-03-26 MED ORDER — GUAIFENESIN ER 600 MG PO TB12: 600.0000 mg | ORAL_TABLET | Freq: Two times a day (BID) | ORAL | 2 refills | Status: AC

## 2021-03-26 MED ORDER — MELATONIN 3 MG PO TABS: 6.0000 mg | ORAL_TABLET | Freq: Every day | ORAL | Status: DC

## 2021-03-26 MED ORDER — KETOROLAC TROMETHAMINE 30 MG/ML IJ SOLN
15.0000 mg | Freq: Once | INTRAMUSCULAR | Status: AC
  Administered 2021-03-26: 15 mg via INTRAVENOUS
  Filled 2021-03-26: qty 1

## 2021-03-26 MED ORDER — ACETAMINOPHEN 325 MG PO TABS
650.0000 mg | ORAL_TABLET | ORAL | Status: DC | PRN
  Administered 2021-03-26: 650 mg via ORAL
  Filled 2021-03-26: qty 2

## 2021-03-26 MED ORDER — SODIUM CHLORIDE 0.9 % IV SOLN
2.0000 g | INTRAVENOUS | Status: DC
  Administered 2021-03-26: 2 g via INTRAVENOUS
  Filled 2021-03-26: qty 20

## 2021-03-26 MED ORDER — METOPROLOL TARTRATE 25 MG PO TABS: 12.5000 mg | ORAL_TABLET | Freq: Two times a day (BID) | ORAL | Status: DC

## 2021-03-26 NOTE — Assessment & Plan Note (Signed)
Continue lisinopril, Lopressor, Cardura

## 2021-03-26 NOTE — Assessment & Plan Note (Signed)
Continue Protonix °

## 2021-03-26 NOTE — Telephone Encounter (Signed)
Tried to call patient, no answer.   Per Dr. Johnsie Cancel, Had benign monitor and echo 2021 can order another 30 day monitor and continue beta blocker .

## 2021-03-26 NOTE — Progress Notes (Signed)
*  PRELIMINARY RESULTS* Echocardiogram 2D Echocardiogram has been performed.  Alexander Nunez 03/26/2021, 10:34 AM

## 2021-03-26 NOTE — H&P (Signed)
History and Physical    Patient: Alexander Nunez QVZ:[id number] DOB: [birth year] DOA: 03/25/2021 DOS: the patient was seen and examined on 03/26/2021 PCP: Redmond School, MD  Patient coming from: Home  Chief Complaint:  Chief Complaint  Patient presents with   Chest Pain    HPI: Alexander Nunez is a 76 y.o. male with medical history significant of with history of anxiety, GERD, heart palpitations, hyperlipidemia, hypertension, and more presents to ED with a chief complaint of chest tightness.  Patient reports he had chest tightness that started 2 days ago.  Got acutely worse today.  Has been associated with palpitations.  He is also been coughing a lot and has been productive of clear sputum.  All the symptoms became worse today.  Patient reports that they have otherwise been constant up until today.  He does not use inhalers.  He has an O2 monitor at home, he noticed that his O2 was less than it normally is with ambulation.  The lowest O2 he noticed at home was 90.  Patient denies any fevers.  He denies myalgias and diaphoresis.  He has not had any sick contacts.  Patient reports that the palpitations started day before yesterday and they woke him up out of sleep.  It kept him awake for about an hour.  The palpitations never resolved the patient was able to go back to sleep.  He reports no worsening dyspnea with these palpitations.  Heart palpitations are an issue for him, for which he is prescribed metoprolol.  Review of Systems: As mentioned in the history of present illness. All other systems reviewed and are negative. Past Medical History:  Diagnosis Date   Anxiety    Arthritis    back   Back pain, chronic    Colon polyp    Diverticulitis    GERD (gastroesophageal reflux disease)    Heart palpitations    High cholesterol    Hypertension    Irritable bowel syndrome    Osteoporosis    Psoriasis    Sleep apnea    c- pap   Past Surgical History:  Procedure Laterality Date   BACK  SURGERY  2015   CHOLECYSTECTOMY N/A 10/12/2015   Procedure: LAPAROSCOPIC CHOLECYSTECTOMY;  Surgeon: Aviva Signs, MD;  Location: [facility];  Service: General;  Laterality: N/A;   COLONOSCOPY     FOOT FRACTURE SURGERY     left foot   KNEE ARTHROSCOPY     left   TONSILLECTOMY     UPPER GASTROINTESTINAL ENDOSCOPY     Social History:  reports that he quit smoking about 47 years ago. His smoking use included cigarettes. He started smoking about 58 years ago. He has a 22.00 pack-year smoking history. He has never used smokeless tobacco. He reports current alcohol use of about 14.0 standard drinks per week. He reports that he does not use drugs.  Allergies  Allergen Reactions   Cephalexin Other (See Comments)    fever   Contrast Media [Iodinated Contrast Media] Hives and Rash    Had persistent rash with only 50mg  benadryl prior to spinal injection on 09/03/2017, try 13 hr prep in future.    Family History  Problem Relation Age of Onset   Arrhythmia Mother        Atrial fib   Arrhythmia Sister        Atrial fib   Stroke Sister    Breast cancer Sister    Bladder Cancer Father  Colon cancer Neg Hx    Stomach cancer Neg Hx    Esophageal cancer Neg Hx    Pancreatic cancer Neg Hx    Prostate cancer Neg Hx    Rectal cancer Neg Hx     Prior to Admission medications   Medication Sig Start Date End Date Taking? Authorizing Provider  albuterol (VENTOLIN HFA) 108 (90 Base) MCG/ACT inhaler Inhale into the lungs as needed. 07/25/19   [provider]  ALPRAZolam Duanne Moron) 0.5 MG tablet Take 0.25-0.5 mg by mouth at bedtime as needed for sleep.     [provider]  AMBULATORY NON FORMULARY MEDICATION Medication Name: Diltiazem 2% - Using your index finger, apply a small amount of medication inside the rectum up to your first knuckle/joint three times daily x 6-8 weeks. 12/31/20   Levin Erp, PA  aspirin EC 81 MG tablet Take 81 mg by mouth daily.    [provider]  Clobetasol Prop Emollient Base 0.05 % emollient cream Apply to affected area qd- Do not apply to face or skin folds 02/11/21   Lavonna Monarch, MD  doxazosin (CARDURA) 4 MG tablet Take 4 mg by mouth at bedtime.      [provider]  fluticasone (FLONASE) 50 MCG/ACT nasal spray PLACE 1 SPRAY INTO BOTH NOSTRILS DAILY. 01/18/20   Lauraine Rinne, NP  hydrocortisone (ANUSOL-HC) 25 MG suppository Place 1 suppository (25 mg total) rectally 2 (two) times daily. 11/06/20   Melynda Ripple, MD  hydrocortisone 2.5 % cream Apply topically 2 (two) times daily as needed (Rash). Apply to eyelids & under arms qd 02/11/21   Lavonna Monarch, MD  lisinopril (PRINIVIL,ZESTRIL) 10 MG tablet Take 10 mg by mouth every evening.     [provider]  metoprolol tartrate (LOPRESSOR) 25 MG tablet Take 1 tablet (25 mg total) by mouth 2 (two) times daily. 05/05/14   Black, Lezlie Octave, NP  pantoprazole (PROTONIX) 40 MG tablet Take 40 mg by mouth daily.    [provider]  pravastatin (PRAVACHOL) 20 MG tablet Take 20 mg by mouth every evening.     [provider]  triamcinolone cream (KENALOG) 0.1 % Apply 1 application topically 2 (two) times daily. 10/23/19   Lestine Box, PA-C    Physical Exam: Vitals:   03/26/21 0053 03/26/21 0058 03/26/21 0143 03/26/21 0459  BP: (!) 145/72   133/66  Pulse: (!) 57  61 66  Resp:    18  Temp: 97.6 F (36.4 C)   97.8 F (36.6 C)  TempSrc: Oral   Oral  SpO2: 98%   96%  Weight:  127.9 kg    Height:  5\' 11"  (1.803 m)     1.  General: Patient lying supine in bed,  no acute distress   2. Psychiatric: Alert and oriented x 3, mood and behavior normal for situation, pleasant and cooperative with exam   3. Neurologic: Speech and language are normal, face is symmetric, moves all 4 extremities voluntarily, at baseline without acute deficits on limited exam   4. HEENMT:  Head is atraumatic, normocephalic, pupils reactive to light, neck is supple,  trachea is midline, mucous membranes are moist   5. Respiratory : Lungs are clear to auscultation bilaterally without wheezing, rhonchi, rales, no cyanosis, no increase in work of breathing or accessory muscle use   6. Cardiovascular : Heart rate normal, rhythm is regular, no murmurs, rubs or gallops, no peripheral edema, peripheral pulses palpated   7. Gastrointestinal:  Abdomen is soft, nondistended, nontender to palpation bowel sounds active, no masses or organomegaly palpated   8. Skin:  Skin is warm, dry and intact without rashes, acute lesions, or ulcers on limited exam   9.Musculoskeletal:  No acute deformities or trauma, no asymmetry in tone, no peripheral edema, peripheral pulses palpated, no tenderness to palpation in the extremities   Data Reviewed: In the ED Temp 97.7, heart rate 86-71, respiratory 20-21, blood pressure 120/60-165/74, 94-97% oxygen saturation No leukocytosis with a white blood cell count of 9.8, hemoglobin 13.1, platelets 281 Slightly increased creatinine at 1.28, BNP 63, troponin 4x2 CT chest shows fibrotic interstitial lung disease with superimposed increased groundglass opacities in the right upper and left lower lobes.  Multilobar pneumonia. EKG shows a heart rate of 75, sinus rhythm, QTc 428 Patient was given nitro in the ED which seemed to resolve all of his symptoms according to ED provider.  For this reason ED provider felt uncomfortable sending patient home and requested admission for further work-up of chest pain.  Assessment and Plan: * Chest pain- (present on admission) Chest pain is most likely related to pneumonia, and not cardiac in nature but patient did have associated palpitations and is worried about his heart Continue medical optimization with aspirin, lisinopril, Lopressor, pravastatin Monitor on telemetry Echo in the a.m. Troponin normal x2 EKG is without acute changes Continue to monitor  CAP (community acquired pneumonia)-  (present on admission) Patient has no leukocytosis skin shows groundglass opacities in the right upper and left lower lobes.  Likely multilobar pneumonia.  Overlying fibrotic interstitial lung disease.  There is also reactive lymph nodes Patient was started on Levaquin due to an intolerance to Rocephin Continue with Rocephin and Zithromax Sputum culture Pneumonia order set utilized Continue to monitor  GERD- (present on admission) Continue Protonix  Essential hypertension- (present on admission) Continue lisinopril, Lopressor, Cardura  Hyperlipidemia- (present on admission) Continue pravastatin       Advance Care Planning:   Code Status: Full Code   Consults: None  Family Communication: No family at bedside  Severity of Illness: The appropriate patient status for this patient is OBSERVATION. Observation status is judged to be reasonable and necessary in order to provide the required intensity of service to ensure the patient's safety. The patient's presenting symptoms, physical exam findings, and initial radiographic and laboratory data in the context of their medical condition is felt to place them at decreased risk for further clinical deterioration. Furthermore, it is anticipated that the patient will be medically stable for discharge from the hospital within 2 midnights of admission.   Author: Rolla Plate, DO 03/26/2021 5:40 AM  For on call review [email or web address].

## 2021-03-26 NOTE — Telephone Encounter (Signed)
Patient is currently in the ED and ED nurse has told patient he will receive monitor through the mail and instructions will be sent through mychart.

## 2021-03-26 NOTE — Assessment & Plan Note (Signed)
Continue pravastatin 

## 2021-03-26 NOTE — Assessment & Plan Note (Signed)
Chest pain is most likely related to pneumonia, and not cardiac in nature but patient did have associated palpitations and is worried about his heart Continue medical optimization with aspirin, lisinopril, Lopressor, pravastatin Monitor on telemetry Echo in the a.m. Troponin normal x2 EKG is without acute changes Continue to monitor

## 2021-03-26 NOTE — Assessment & Plan Note (Signed)
Patient has no leukocytosis skin shows groundglass opacities in the right upper and left lower lobes.  Likely multilobar pneumonia.  Overlying fibrotic interstitial lung disease.  There is also reactive lymph nodes Patient was started on Levaquin due to an intolerance to Rocephin Continue with Rocephin and Zithromax Sputum culture Pneumonia order set utilized Continue to monitor

## 2021-03-26 NOTE — Telephone Encounter (Signed)
Tried  to call patient again, no answer. Placed order for monitor.

## 2021-03-27 NOTE — Discharge Summary (Signed)
Physician Discharge Summary  Alexander Nunez IOX:[id number] DOB: 02/17/1945 DOA: 03/25/2021  PCP: Redmond School, MD  Admit date: 03/25/2021 Discharge date: 03/26/2021  Admitted From: home Disposition:  home  Recommendations for Outpatient Follow-up:  Follow-up with Dr. Johnsie Cancel as already scheduled in April He has been set up with a 30-day event monitor Referral has been made for pulmonary follow-up.  Will likely need repeat imaging of his chest to ensure resolution of pneumonia  Home Health: Equipment/Devices:  Discharge Condition: Stable CODE STATUS: Full code Diet recommendation: Heart healthy  Brief/Interim Summary: This is a 76 year old male with a history of interstitial lung disease, followed by pulmonology, hypertension, was admitted to the hospital with chest discomfort and palpitations.  Patient does have prior history of palpitations for which she takes metoprolol.  He noted that he was having worsening palpitations intermittently over the past few weeks.  Over the past few days, this had gotten substantially worse and was not subsiding.  This is what led to the emergency room.  He did not have any fever.  He does have a more productive cough for the past 2 weeks.  He was evaluated in the emergency room where CT chest indicated possible developing pneumonia superimposed on baseline interstitial lung disease.  He was started on antibiotics.  Cardiac work-up including EKG, troponins, echocardiogram did not show any significant findings.  He was monitored in the hospital and did not have any oxygen requirements.  He was feeling increasingly improved and felt that palpitations have also improved.  He was ambulated on telemetry and it was noted that he did have frequent PVCs/PACs and a few runs of nonsustained VT.  Case was reviewed with Dr. Debara Pickett who felt that with a normal ejection fraction, risk of lethal arrhythmia would be low.  It was recommended to continue on beta-blockers and treat  his underlying pulmonary illness which is likely exacerbating his palpitations.  He will be set up with a 30-day event monitor.  He will be discharged with a course of antibiotics.  He already has cardiology follow-up scheduled.  Plan was discussed with the patient who felt comfortable discharging home with antibiotics.  He was advised to return to emergency room if he had any persistent chest pain or had any syncope.  Discharge Diagnoses:  Principal Problem:   Chest pain Active Problems:   Hyperlipidemia   Essential hypertension   GERD   CAP (community acquired pneumonia)    Discharge Instructions  Discharge Instructions     Ambulatory referral to Pulmonology   Complete by: As directed    Patient has not seen Dr. Vaughan Browner since 6/22 and requests follow up to discuss CT findings and further work up of ILD   Reason for referral: Interstitial Lung Disease(ILD)   Diet - low sodium heart healthy   Complete by: As directed    Increase activity slowly   Complete by: As directed       Allergies as of 03/26/2021       Reactions   Cephalexin Other (See Comments)   fever   Contrast Media [iodinated Contrast Media] Hives, Rash   Had persistent rash with only 50mg  benadryl prior to spinal injection on 09/03/2017, try 13 hr prep in future.        Medication List     STOP taking these medications    AMBULATORY NON FORMULARY MEDICATION   Clobetasol Prop Emollient Base 0.05 % emollient cream   fluticasone 50 MCG/ACT nasal spray Commonly known as: FLONASE  hydrocortisone 2.5 % cream   hydrocortisone 25 MG suppository Commonly known as: ANUSOL-HC   triamcinolone cream 0.1 % Commonly known as: KENALOG       TAKE these medications    albuterol 108 (90 Base) MCG/ACT inhaler Commonly known as: VENTOLIN HFA Inhale 1-2 puffs into the lungs every 4 (four) hours as needed for shortness of breath or wheezing.   ALPRAZolam 0.5 MG tablet Commonly known as: XANAX Take 0.25-0.5 mg  by mouth 3 (three) times daily as needed for sleep.   aspirin EC 81 MG tablet Take 81 mg by mouth daily.   doxazosin 4 MG tablet Commonly known as: CARDURA Take 4 mg by mouth at bedtime.   guaiFENesin 600 MG 12 hr tablet Commonly known as: Mucinex Take 1 tablet (600 mg total) by mouth 2 (two) times daily.   levofloxacin 750 MG tablet Commonly known as: Levaquin Take 1 tablet (750 mg total) by mouth daily for 7 days.   lisinopril 10 MG tablet Commonly known as: ZESTRIL Take 10 mg by mouth every evening.   metoprolol tartrate 25 MG tablet Commonly known as: LOPRESSOR Take 0.5 tablets (12.5 mg total) by mouth 2 (two) times daily.   pantoprazole 40 MG tablet Commonly known as: PROTONIX Take 40 mg by mouth daily.   pravastatin 20 MG tablet Commonly known as: PRAVACHOL Take 20 mg by mouth every evening.        Follow-up Information     Josue Hector, MD Follow up.   Specialty: Cardiology Why: follow up as scheduled will arrange 30 day monitor Contact information: [address] [address] [phone]         Marshell Garfinkel, MD Follow up.   Specialty: Pulmonary Disease Why: office will contact you with appointment Contact information: [place] [address] [phone]         Redmond School, MD. Schedule an appointment as soon as possible for a visit in 1 week(s).   Specialty: Internal Medicine Why: follow up thyroid results Contact information: [address] [place] [address] [phone]                Allergies  Allergen Reactions   Cephalexin Other (See Comments)    fever   Contrast Media [Iodinated Contrast Media] Hives and Rash    Had persistent rash with only 50mg  benadryl prior to spinal injection on 09/03/2017, try 13 hr prep in future.    Consultations:    Procedures/Studies: DG Chest 2 View  Result Date: 03/25/2021 CLINICAL DATA:  Chest pressure and  palpitations. EXAM: CHEST - 2 VIEW COMPARISON:  None. FINDINGS: Moderate to marked severity diffuse, chronic appearing increased interstitial lung markings are seen. Superimposed component of atelectasis and/or infiltrate are noted within the bilateral lung bases and mid to upper right lung. There is no evidence of a pleural effusion or pneumothorax. The heart size and mediastinal contours are within normal limits. Surgical clips are seen within the right upper quadrant. Degenerative changes are noted throughout the thoracic spine. IMPRESSION: Chronic appearing increased interstitial lung markings with superimposed bibasilar and mid to upper right lung atelectasis and/or infiltrate. Electronically Signed   By: Virgina Norfolk M.D.   On: 03/25/2021 19:19   CT Chest Wo Contrast  Result Date: 03/25/2021 CLINICAL DATA:  Abnormal CXR with chest pressure. Suspected mid to upper right lung atelectasis or pneumonic infiltrate. EXAM: CT CHEST WITHOUT CONTRAST TECHNIQUE: Multidetector CT imaging of the chest was  performed following the standard protocol without IV contrast. RADIATION DOSE REDUCTION: This exam was performed according to the departmental dose-optimization program which includes automated exposure control, adjustment of the mA and/or kV according to patient size and/or use of iterative reconstruction technique. COMPARISON:  Chest CT without contrast dated 10/05/2020 and 09/19/2019 FINDINGS: Cardiovascular: There is mild cardiomegaly. There is minimal pericardial effusion. There are scattered three-vessel coronary artery calcifications, prominent pulmonary trunk 3.4 cm again indicating arterial hypertension. There is mild aortic atherosclerosis. Ectatic ascending segment 3.8 cm with remainder within normal caliber limits and mild calcifications in the branch arteries with descending tortuosity. Central pulmonary veins are normal caliber. Mediastinum/Nodes: 4 cm mass right lobe of thyroid with few dystrophic  calcifications anteriorly is unchanged. There is increased prominence of mediastinal nodes, for example pretracheal lymph node right of midline measures 1.4 cm in short axis on series 2 axial 47, previously 0.8 cm. Left-sided prevascular space index node is now 1.1 cm in short axis, previously 0.8 cm. Right paratracheal lymph node is now 1.1 cm on axial 28, previously 0.8 cm. Subcarinal nodal conglomerate to the right measuring 1.6 cm in short axis on axial 76, previously 1.1 cm. No significant hilar adenopathy is suspected. Axillary spaces clear. The esophagus is unremarkable. Lungs/Pleura: There is no pleural effusion, thickening or pneumothorax. There are minimal paraseptal emphysematous changes in the lung apices. Patchy subpleural reticulation of the lung fields with a predominance in the upper zones continues to be noted with additional peribronchovascular reticulation and with mild features of honeycombing laterally in the upper lobes with traction bronchiolectasis. Today there are increased patchy ground-glass opacities in the left lower and right upper lobes. There are superimposed chronic ground-glass interstitial changes in portions of all pulmonary lobes. Central airways are clear. Upper Abdomen: There are calcified granulomas in the liver. Old cholecystectomy. No acute abnormality is seen without contrast. Musculoskeletal: There is bridging enthesopathy in the lower thoracic spine of DISH. No destructive osseous or lytic lesions. IMPRESSION: 1. Fibrotic interstitial lung disease, with superimposed increased ground-glass opacities in the right upper and left lower lobes most likely due to multilobar pneumonia. 2. Increased prominence of mediastinal lymph nodes with examples given above. Most likely this is reactive adenopathy. A follow-up study is recommended after treatment. 3. Cardiomegaly with prominent pulmonary trunk, borderline aneurysmal ascending aorta, with aortic and great vessel  atherosclerosis and calcific CAD. 4. 4 cm right thyroid mass. Last thyroid ultrasound in our system dated 06/06/2019. Follow-up ultrasound suggested. Electronically Signed   By: Telford Nab M.D.   On: 03/25/2021 23:27   ECHOCARDIOGRAM COMPLETE  Result Date: 03/26/2021    ECHOCARDIOGRAM REPORT   Patient Name:   Alexander Nunez Date of Exam: 03/26/2021 Medical Rec #:  [id number]      Height:       71.0 in Accession #:    [id number]     Weight:       282.0 lb Date of Birth:  16-Dec-1945      BSA:          2.440 m Patient Age:    71 years       BP:           132/66 mmHg Patient Gender: M              HR:           78 bpm. Exam Location:  [facility] Procedure: 2D Echo, Cardiac Doppler and Color Doppler Indications:    Chest Pain  History:        Patient has prior history of Echocardiogram examinations, most                 recent 08/11/2019. Signs/Symptoms:Chest Pain and Shortness of                 Breath; Risk Factors:Hypertension and Dyslipidemia.  Sonographer:    Wenda Low Referring Phys: [id number] ASIA B [place]  Sonographer Comments: Technically difficult study due to poor echo windows. IMPRESSIONS  1. Left ventricular ejection fraction, by estimation, is 60 to 65%. The left ventricle has normal function. The left ventricle has no regional wall motion abnormalities. There is mild left ventricular hypertrophy. Left ventricular diastolic parameters are consistent with Grade I diastolic dysfunction (impaired relaxation).  2. Right ventricular systolic function is normal. The right ventricular size is normal.  3. The mitral valve was not well visualized. No evidence of mitral valve regurgitation.  4. The aortic valve was not well visualized. Aortic valve regurgitation is not visualized. Comparison(s): Changes from prior study are noted. 08/11/2019: LVEF 55-60%. FINDINGS  Left Ventricle: Left ventricular ejection fraction, by estimation, is 60 to 65%. The left ventricle has normal function. The left ventricle  has no regional wall motion abnormalities. The left ventricular internal cavity size was normal in size. There is  mild left ventricular hypertrophy. Left ventricular diastolic parameters are consistent with Grade I diastolic dysfunction (impaired relaxation). Indeterminate filling pressures. Right Ventricle: The right ventricular size is normal. No increase in right ventricular wall thickness. Right ventricular systolic function is normal. Left Atrium: Left atrial size was normal in size. Right Atrium: Right atrial size was normal in size. Pericardium: There is no evidence of pericardial effusion. Mitral Valve: The mitral valve was not well visualized. No evidence of mitral valve regurgitation. MV peak gradient, 3.2 mmHg. The mean mitral valve gradient is 1.0 mmHg. Tricuspid Valve: The tricuspid valve is grossly normal. Tricuspid valve regurgitation is trivial. Aortic Valve: The aortic valve was not well visualized. Aortic valve regurgitation is not visualized. Aortic valve mean gradient measures 5.0 mmHg. Aortic valve peak gradient measures 8.9 mmHg. Aortic valve area, by VTI measures 3.33 cm. Pulmonic Valve: The pulmonic valve was not well visualized. Pulmonic valve regurgitation is not visualized. Aorta: The aortic root and ascending aorta are structurally normal, with no evidence of dilitation. Venous: The inferior vena cava was not well visualized. IAS/Shunts: The interatrial septum was not well visualized.  LEFT VENTRICLE PLAX 2D LVIDd:         5.50 cm     Diastology LVIDs:         2.80 cm     LV e' medial:    7.62 cm/s LV PW:         1.30 cm     LV E/e' medial:  9.3 LV IVS:        1.30 cm     LV e' lateral:   8.81 cm/s LVOT diam:     2.20 cm     LV E/e' lateral: 8.0 LV SV:         109 LV SV Index:   45 LVOT Area:     3.80 cm  LV Volumes (MOD) LV vol d, MOD A2C: 65.1 ml LV vol d, MOD A4C: 99.2 ml LV vol s, MOD A2C: 36.4 ml LV vol s, MOD A4C: 34.6 ml LV SV MOD A2C:     28.7 ml LV SV MOD A4C:     99.2 ml  LV  SV MOD BP:      46.7 ml RIGHT VENTRICLE RV Basal diam:  3.05 cm RV Mid diam:    2.80 cm RV S prime:     18.80 cm/s TAPSE (M-mode): 2.9 cm LEFT ATRIUM             Index        RIGHT ATRIUM           Index LA diam:        4.90 cm 2.01 cm/m   RA Area:     17.60 cm LA Vol (A2C):   45.1 ml 18.48 ml/m  RA Volume:   37.20 ml  15.25 ml/m LA Vol (A4C):   78.8 ml 32.29 ml/m LA Biplane Vol: 62.3 ml 25.53 ml/m  AORTIC VALVE                     PULMONIC VALVE AV Area (Vmax):    3.62 cm      PV Vmax:       0.86 m/s AV Area (Vmean):   3.07 cm      PV Peak grad:  3.0 mmHg AV Area (VTI):     3.33 cm AV Vmax:           149.00 cm/s AV Vmean:          111.000 cm/s AV VTI:            0.328 m AV Peak Grad:      8.9 mmHg AV Mean Grad:      5.0 mmHg LVOT Vmax:         142.00 cm/s LVOT Vmean:        89.600 cm/s LVOT VTI:          0.287 m LVOT/AV VTI ratio: 0.88  AORTA Ao Root diam: 3.60 cm Ao Asc diam:  3.70 cm MITRAL VALVE               TRICUSPID VALVE MV Area (PHT): 2.49 cm    TR Peak grad:   23.0 mmHg MV Area VTI:   4.66 cm    TR Vmax:        240.00 cm/s MV Peak grad:  3.2 mmHg MV Mean grad:  1.0 mmHg    SHUNTS MV Vmax:       0.90 m/s    Systemic VTI:  0.29 m MV Vmean:      52.4 cm/s   Systemic Diam: 2.20 cm MV Decel Time: 305 msec MV E velocity: 70.70 cm/s MV A velocity: 83.60 cm/s MV E/A ratio:  0.85 Lyman Bishop MD Electronically signed by Lyman Bishop MD Signature Date/Time: 03/26/2021/10:54:38 AM    Final       Subjective: Feeling better today.  Overall palpitations have improved.  No longer having chest pressure.  Does have a cough.  Discharge Exam: Vitals:   03/26/21 0143 03/26/21 0459 03/26/21 0853 03/26/21 1244  BP:  133/66 (!) 131/57 (!) 119/55  Pulse: 61 66 78 (!) 58  Resp:  18 19 18   Temp:  97.8 F (36.6 C) 97.9 F (36.6 C) 98.2 F (36.8 C)  TempSrc:  Oral Oral Oral  SpO2:  96% 95% 96%  Weight:      Height:        General: Pt is alert, awake, not in acute distress Cardiovascular: RRR, S1/S2  +, no rubs, no gallops Respiratory: CTA bilaterally, no wheezing, no rhonchi Abdominal: Soft, NT, ND, bowel sounds + Extremities:  no edema, no cyanosis    The results of significant diagnostics from this hospitalization (including imaging, microbiology, ancillary and laboratory) are listed below for reference.     Microbiology: Recent Results (from the past 240 hour(s))  Resp Panel by RT-PCR (Flu A&B, Covid) Nasopharyngeal Swab     Status: None   Collection Time: 03/25/21 11:27 PM   Specimen: Nasopharyngeal Swab; Nasopharyngeal(NP) swabs in vial transport medium  Result Value Ref Range Status   SARS Coronavirus 2 by RT PCR NEGATIVE NEGATIVE Final    Comment: (NOTE) SARS-CoV-2 target nucleic acids are NOT DETECTED.  The SARS-CoV-2 RNA is generally detectable in upper respiratory specimens during the acute phase of infection. The lowest concentration of SARS-CoV-2 viral copies this assay can detect is 138 copies/mL. A negative result does not preclude SARS-Cov-2 infection and should not be used as the sole basis for treatment or other patient management decisions. A negative result may occur with  improper specimen collection/handling, submission of specimen other than nasopharyngeal swab, presence of viral mutation(s) within the areas targeted by this assay, and inadequate number of viral copies(<138 copies/mL). A negative result must be combined with clinical observations, patient history, and epidemiological information. The expected result is Negative.  Fact Sheet for Patients:  EntrepreneurPulse.com.au  Fact Sheet for Healthcare Providers:  IncredibleEmployment.be  This test is no t yet approved or cleared by the [place] FDA and  has been authorized for detection and/or diagnosis of SARS-CoV-2 by FDA under an Emergency Use Authorization (EUA). This EUA will remain  in effect (meaning this test can be used) for the duration of  the COVID-19 declaration under Section 564(b)(1) of the Act, 21 U.S.C.section 360bbb-3(b)(1), unless the authorization is terminated  or revoked sooner.       Influenza A by PCR NEGATIVE NEGATIVE Final   Influenza B by PCR NEGATIVE NEGATIVE Final    Comment: (NOTE) The Xpert Xpress SARS-CoV-2/FLU/RSV plus assay is intended as an aid in the diagnosis of influenza from Nasopharyngeal swab specimens and should not be used as a sole basis for treatment. Nasal washings and aspirates are unacceptable for Xpert Xpress SARS-CoV-2/FLU/RSV testing.  Fact Sheet for Patients: EntrepreneurPulse.com.au  Fact Sheet for Healthcare Providers: IncredibleEmployment.be  This test is not yet approved or cleared by the [place] FDA and has been authorized for detection and/or diagnosis of SARS-CoV-2 by FDA under an Emergency Use Authorization (EUA). This EUA will remain in effect (meaning this test can be used) for the duration of the COVID-19 declaration under Section 564(b)(1) of the Act, 21 U.S.C. section 360bbb-3(b)(1), unless the authorization is terminated or revoked.  Performed at [facility], [address], [place], [place] [address]      Labs: BNP (last 3 results) Recent Labs    03/25/21 2137  BNP 60.4   Basic Metabolic Panel: Recent Labs  Lab 03/25/21 1900 03/26/21 0456  NA 138 140  K 3.9 3.8  CL 106 109  CO2 26 25  GLUCOSE 97 98  BUN 21 19  CREATININE 1.28* 1.07  CALCIUM 9.1 9.0  MG  --  1.7   Liver Function Tests: Recent Labs  Lab 03/25/21 2137 03/26/21 0456  AST 23 20  ALT 26 23  ALKPHOS 74 61  BILITOT 0.6 0.8  PROT 7.1 6.4*  ALBUMIN 3.8 3.3*   No results for input(s): LIPASE, AMYLASE in the last 168 hours. No results for input(s): AMMONIA in the last 168 hours. CBC: Recent Labs  Lab 03/25/21 1900 03/26/21 0456  WBC 9.8 8.1  HGB 13.1 12.2*  HCT 40.0 37.3*  MCV 93.7 94.2  PLT 281 263   Cardiac Enzymes: No  results for input(s): CKTOTAL, CKMB, CKMBINDEX, TROPONINI in the last 168 hours. BNP: Invalid input(s): POCBNP CBG: No results for input(s): GLUCAP in the last 168 hours. D-Dimer No results for input(s): DDIMER in the last 72 hours. Hgb A1c No results for input(s): HGBA1C in the last 72 hours. Lipid Profile Recent Labs    03/26/21 0456  CHOL 140  HDL 43  LDLCALC 83  TRIG 71  CHOLHDL 3.3   Thyroid function studies Recent Labs    03/25/21 2137  TSH 0.638   Anemia work up No results for input(s): VITAMINB12, FOLATE, FERRITIN, TIBC, IRON, RETICCTPCT in the last 72 hours. Urinalysis    Component Value Date/Time   COLORURINE STRAW (A) 03/13/2019 1324   APPEARANCEUR CLEAR 03/13/2019 1324   LABSPEC 1.004 (L) 03/13/2019 1324   PHURINE 6.0 03/13/2019 1324   GLUCOSEU NEGATIVE 03/13/2019 1324   HGBUR NEGATIVE 03/13/2019 1324   BILIRUBINUR NEGATIVE 03/13/2019 1324   KETONESUR NEGATIVE 03/13/2019 1324   PROTEINUR NEGATIVE 03/13/2019 1324   UROBILINOGEN 0.2 11/19/2014 1717   NITRITE NEGATIVE 03/13/2019 1324   LEUKOCYTESUR NEGATIVE 03/13/2019 1324   Sepsis Labs Invalid input(s): PROCALCITONIN,  WBC,  LACTICIDVEN Microbiology Recent Results (from the past 240 hour(s))  Resp Panel by RT-PCR (Flu A&B, Covid) Nasopharyngeal Swab     Status: None   Collection Time: 03/25/21 11:27 PM   Specimen: Nasopharyngeal Swab; Nasopharyngeal(NP) swabs in vial transport medium  Result Value Ref Range Status   SARS Coronavirus 2 by RT PCR NEGATIVE NEGATIVE Final    Comment: (NOTE) SARS-CoV-2 target nucleic acids are NOT DETECTED.  The SARS-CoV-2 RNA is generally detectable in upper respiratory specimens during the acute phase of infection. The lowest concentration of SARS-CoV-2 viral copies this assay can detect is 138 copies/mL. A negative result does not preclude SARS-Cov-2 infection and should not be used as the sole basis for treatment or other patient management decisions. A negative  result may occur with  improper specimen collection/handling, submission of specimen other than nasopharyngeal swab, presence of viral mutation(s) within the areas targeted by this assay, and inadequate number of viral copies(<138 copies/mL). A negative result must be combined with clinical observations, patient history, and epidemiological information. The expected result is Negative.  Fact Sheet for Patients:  EntrepreneurPulse.com.au  Fact Sheet for Healthcare Providers:  IncredibleEmployment.be  This test is no t yet approved or cleared by the [place] FDA and  has been authorized for detection and/or diagnosis of SARS-CoV-2 by FDA under an Emergency Use Authorization (EUA). This EUA will remain  in effect (meaning this test can be used) for the duration of the COVID-19 declaration under Section 564(b)(1) of the Act, 21 U.S.C.section 360bbb-3(b)(1), unless the authorization is terminated  or revoked sooner.       Influenza A by PCR NEGATIVE NEGATIVE Final   Influenza B by PCR NEGATIVE NEGATIVE Final    Comment: (NOTE) The Xpert Xpress SARS-CoV-2/FLU/RSV plus assay is intended as an aid in the diagnosis of influenza from Nasopharyngeal swab specimens and should not be used as a sole basis for treatment. Nasal washings and aspirates are unacceptable for Xpert Xpress SARS-CoV-2/FLU/RSV testing.  Fact Sheet for Patients: EntrepreneurPulse.com.au  Fact Sheet for Healthcare Providers: IncredibleEmployment.be  This test is not yet approved or cleared by the [place] FDA and has been authorized for detection and/or diagnosis of SARS-CoV-2  by FDA under an Emergency Use Authorization (EUA). This EUA will remain in effect (meaning this test can be used) for the duration of the COVID-19 declaration under Section 564(b)(1) of the Act, 21 U.S.C. section 360bbb-3(b)(1), unless the authorization is  terminated or revoked.  Performed at [facility], [address], [place], [place] [address]      Time coordinating discharge: 62mins  SIGNED:   Kathie Dike, MD  [facility] 03/27/2021, 12:39 AM   If 7PM-7AM, please contact night-coverage [email or web address]

## 2021-04-02 DIAGNOSIS — I10 Essential (primary) hypertension: Secondary | ICD-10-CM | POA: Diagnosis not present

## 2021-04-02 DIAGNOSIS — I700 Atherosclerosis of aorta: Secondary | ICD-10-CM | POA: Diagnosis not present

## 2021-04-02 DIAGNOSIS — J449 Chronic obstructive pulmonary disease, unspecified: Secondary | ICD-10-CM | POA: Diagnosis not present

## 2021-04-02 DIAGNOSIS — Z6841 Body Mass Index (BMI) 40.0 and over, adult: Secondary | ICD-10-CM | POA: Diagnosis not present

## 2021-04-02 DIAGNOSIS — M7022 Olecranon bursitis, left elbow: Secondary | ICD-10-CM | POA: Diagnosis not present

## 2021-04-02 DIAGNOSIS — J849 Interstitial pulmonary disease, unspecified: Secondary | ICD-10-CM | POA: Diagnosis not present

## 2021-04-02 DIAGNOSIS — Z1331 Encounter for screening for depression: Secondary | ICD-10-CM | POA: Diagnosis not present

## 2021-04-02 DIAGNOSIS — J159 Unspecified bacterial pneumonia: Secondary | ICD-10-CM | POA: Diagnosis not present

## 2021-04-02 DIAGNOSIS — M7552 Bursitis of left shoulder: Secondary | ICD-10-CM | POA: Diagnosis not present

## 2021-04-05 ENCOUNTER — Ambulatory Visit: Payer: Medicare HMO | Admitting: Nurse Practitioner

## 2021-04-05 ENCOUNTER — Other Ambulatory Visit: Payer: Self-pay

## 2021-04-05 ENCOUNTER — Encounter: Payer: Self-pay | Admitting: Nurse Practitioner

## 2021-04-05 ENCOUNTER — Ambulatory Visit (INDEPENDENT_AMBULATORY_CARE_PROVIDER_SITE_OTHER): Payer: Medicare HMO

## 2021-04-05 VITALS — BP 128/74 | HR 61 | Temp 98.5°F | Ht 71.0 in | Wt 279.8 lb

## 2021-04-05 DIAGNOSIS — R002 Palpitations: Secondary | ICD-10-CM

## 2021-04-05 DIAGNOSIS — I10 Essential (primary) hypertension: Secondary | ICD-10-CM

## 2021-04-05 DIAGNOSIS — E079 Disorder of thyroid, unspecified: Secondary | ICD-10-CM | POA: Diagnosis not present

## 2021-04-05 DIAGNOSIS — J189 Pneumonia, unspecified organism: Secondary | ICD-10-CM

## 2021-04-05 DIAGNOSIS — J849 Interstitial pulmonary disease, unspecified: Secondary | ICD-10-CM

## 2021-04-05 NOTE — Assessment & Plan Note (Signed)
Incidental on CT chest. Noted to be relatively unchanged when compared to previous imaging. Last [place] 2021-advised follow up. Advised pt to follow up with PCP regarding further imaging/work up.  ?

## 2021-04-05 NOTE — Assessment & Plan Note (Signed)
BP stable at visit. F/u with PCP as scheduled.  ?

## 2021-04-05 NOTE — Assessment & Plan Note (Signed)
No recurrent episodes since ED visit. Received holter monitor today so will apply and wear for 30 days then follow up with cardiology. ?

## 2021-04-05 NOTE — Progress Notes (Signed)
@Patient  ID: Alexander Nunez, male    DOB: [birth year], 76 y.o.   MRN: [id number]  Chief Complaint  Patient presents with   Hospitalization [facility] follow up. Pt got out of hospital two weeks ago. Pt had PNA. Pt states he feels like he still has some congestion noted.     Referring provider: Redmond School, MD  HPI: 76 year old male, former smoker (22 pack years) followed for ILD. He is a patient of Dr. Matilde Bash and last seen 08/29/2020. Past medical history significant for HTN, HLD, sleep apnea, psoriasis previously treated with methotrexate.   TEST/EVENTS:  03/25/2021 CT chest wo contrast: mild cardiomegaly with a minimal pleural effusion. CAD and atherosclerosis present. 4 cm mass right lobe of thyroid, unchanged. Increased prominence of mediastinal lymph nodes, likely reactive. Presence of fibrotic interstitial lung disease with superimposed increased ground-glass opacities in right upper and left lower lobes suspected to be related to multifocal pna.   08/29/2020: OV with Dr. Vaughan Browner. CT with alternate pattern of pulmonary fibrosis with upper lobe predominance; could be hypersensitivity pneumonitis though he does not have any significant exposures and panel neg. Remote exposure to asbestos but CT findings not typical of asbestosis. Breathing stable with no concerns at visit. F/u after PFTs which showed moderate restriction and mild diffusion defect. FVC and DLCO slightly declined when compared to previous studies. Discussed possible options for further workup - decision to monitor as he is asymptomatic and feeling well. F/u HRCT chest ordered.   03/25/2021-03/26/2021: ED visit for atypical chest pains and palpitations - no ACS but found to have superimposed pna on CXR and CT. Discharged on 7 day levaquin course. Advised to return if symptoms worsened or didn't improve and follow up with pulm outpt. Referral to cardiology with heart monitor for eval of palpitations.   04/05/2021:  Today - follow up  Patient presents today for follow up after being treated in the ED for pneumonia. He completed his levaquin course two days ago and reports feeling better. He continues to have some mild chest congestion and cough, which has improved and is only productive in AM with clear to yellow sputum. He has not had any difficulties with his breathing and has not had any further episodes of palpitations. He denies any fevers, chills, fatigue or weakness. Overall, he's improved and offers no further complaints.   Allergies  Allergen Reactions   Cephalexin Other (See Comments)    fever   Contrast Media [Iodinated Contrast Media] Hives and Rash    Had persistent rash with only 50mg  benadryl prior to spinal injection on 09/03/2017, try 13 hr prep in future.    Immunization History  Administered Date(s) Administered   Influenza, High Dose Seasonal PF 10/11/2018   PFIZER(Purple Top)SARS-COV-2 Vaccination 03/11/2019, 04/01/2019   Pneumococcal Polysaccharide-23 10/10/2016   Tdap 08/14/2010, 07/25/2018    Past Medical History:  Diagnosis Date   Anxiety    Arthritis    back   Back pain, chronic    Colon polyp    Diverticulitis    GERD (gastroesophageal reflux disease)    Heart palpitations    High cholesterol    Hypertension    Irritable bowel syndrome    Osteoporosis    Psoriasis    Sleep apnea    c- pap    Tobacco History: Social History   Tobacco Use  Smoking Status Former   Packs/day: 2.00   Years: 11.00   Pack years: 22.00   Types: Cigarettes  Start date: 05/22/1962   Quit date: 05/21/1973   Years since quitting: 47.9  Smokeless Tobacco Never  Tobacco Comments   Quit 25 yrs now    Counseling given: Not Answered Tobacco comments: Quit 25 yrs now    Outpatient Medications Prior to Visit  Medication Sig Dispense Refill   albuterol (VENTOLIN HFA) 108 (90 Base) MCG/ACT inhaler Inhale 1-2 puffs into the lungs every 4 (four) hours as needed for shortness of breath  or wheezing. 6.7 g 0   ALPRAZolam (XANAX) 0.5 MG tablet Take 0.25-0.5 mg by mouth 3 (three) times daily as needed for sleep.     aspirin EC 81 MG tablet Take 81 mg by mouth daily.     doxazosin (CARDURA) 4 MG tablet Take 4 mg by mouth at bedtime.       guaiFENesin (MUCINEX) 600 MG 12 hr tablet Take 1 tablet (600 mg total) by mouth 2 (two) times daily. 60 tablet 2   lisinopril (PRINIVIL,ZESTRIL) 10 MG tablet Take 10 mg by mouth every evening.      metoprolol tartrate (LOPRESSOR) 25 MG tablet Take 0.5 tablets (12.5 mg total) by mouth 2 (two) times daily.     pantoprazole (PROTONIX) 40 MG tablet Take 40 mg by mouth daily.     pravastatin (PRAVACHOL) 20 MG tablet Take 20 mg by mouth every evening.      No facility-administered medications prior to visit.     Review of Systems:   Constitutional: No weight loss or gain, night sweats, fevers, chills, fatigue, or lassitude. HEENT: No headaches, difficulty swallowing, tooth/dental problems, or sore throat. No sneezing, itching, ear ache, nasal congestion, or post nasal drip CV:  No chest pain, orthopnea, PND, swelling in lower extremities, anasarca, dizziness, palpitations, syncope Resp: +productive AM cough (improving); chest congestion (improving). No shortness of breath with exertion or at rest. No excess mucus or change in color of mucus. No hemoptysis. No wheezing.  No chest wall deformity GI:  No heartburn, indigestion, abdominal pain, nausea, vomiting, diarrhea, change in bowel habits, loss of appetite, bloody stools.  GU: No dysuria, change in color of urine, urgency or frequency.  No flank pain, no hematuria  Skin: No rash, lesions, ulcerations MSK:  No joint pain or swelling.  No decreased range of motion.  No back pain. Neuro: No dizziness or lightheadedness.  Psych: No depression or anxiety. Mood stable.     Physical Exam:  BP 128/74 (BP Location: Right Arm, Patient Position: Sitting, Cuff Size: Normal)    Pulse 61    Temp 98.5 F  (36.9 C) (Oral)    Ht 5\' 11"  (1.803 m)    Wt 279 lb 12.8 oz (126.9 kg)    SpO2 97%    BMI 39.02 kg/m   GEN: Pleasant, interactive, well-appearing; obese; in no acute distress. HEENT:  Normocephalic and atraumatic. PERRLA. Sclera white. Nasal turbinates pink, moist and patent bilaterally. No rhinorrhea present. Oropharynx pink and moist, without exudate or edema. No lesions, ulcerations, or postnasal drip.  NECK:  Supple w/ fair ROM. No JVD present. Normal carotid impulses w/o bruits. Thyroid symmetrical with no goiter or nodules palpated. No lymphadenopathy.   CV: RRR, no m/r/g, no peripheral edema. Pulses intact, +2 bilaterally. No cyanosis, pallor or clubbing. PULMONARY:  Unlabored, regular breathing. Clear bilaterally A&P w/o wheezes/rales/rhonchi. No accessory muscle use. No dullness to percussion. GI: BS present and normoactive. Soft, non-tender to palpation. No organomegaly or masses detected. No CVA tenderness. MSK: No erythema, warmth or tenderness. Cap  refil <2 sec all extrem. No deformities or joint swelling noted.  Neuro: A/Ox3. No focal deficits noted.   Skin: Warm, no lesions or rashe Psych: Normal affect and behavior. Judgement and thought content appropriate.     Lab Results:  CBC    Component Value Date/Time   WBC 8.1 03/26/2021 0456   RBC 3.96 (L) 03/26/2021 0456   HGB 12.2 (L) 03/26/2021 0456   HCT 37.3 (L) 03/26/2021 0456   PLT 263 03/26/2021 0456   MCV 94.2 03/26/2021 0456   MCH 30.8 03/26/2021 0456   MCHC 32.7 03/26/2021 0456   RDW 14.0 03/26/2021 0456   LYMPHSABS 2.9 03/13/2019 1330   MONOABS 1.1 (H) 03/13/2019 1330   EOSABS 0.2 03/13/2019 1330   BASOSABS 0.1 03/13/2019 1330    BMET    Component Value Date/Time   NA 140 03/26/2021 0456   K 3.8 03/26/2021 0456   CL 109 03/26/2021 0456   CO2 25 03/26/2021 0456   GLUCOSE 98 03/26/2021 0456   BUN 19 03/26/2021 0456   CREATININE 1.07 03/26/2021 0456   CALCIUM 9.0 03/26/2021 0456   GFRNONAA >60  03/26/2021 0456   GFRAA >60 03/13/2019 1330    BNP    Component Value Date/Time   BNP 63.0 03/25/2021 2137     Imaging:  DG Chest 2 View  Result Date: 03/25/2021 CLINICAL DATA:  Chest pressure and palpitations. EXAM: CHEST - 2 VIEW COMPARISON:  None. FINDINGS: Moderate to marked severity diffuse, chronic appearing increased interstitial lung markings are seen. Superimposed component of atelectasis and/or infiltrate are noted within the bilateral lung bases and mid to upper right lung. There is no evidence of a pleural effusion or pneumothorax. The heart size and mediastinal contours are within normal limits. Surgical clips are seen within the right upper quadrant. Degenerative changes are noted throughout the thoracic spine. IMPRESSION: Chronic appearing increased interstitial lung markings with superimposed bibasilar and mid to upper right lung atelectasis and/or infiltrate. Electronically Signed   By: Virgina Norfolk M.D.   On: 03/25/2021 19:19   CT Chest Wo Contrast  Result Date: 03/25/2021 CLINICAL DATA:  Abnormal CXR with chest pressure. Suspected mid to upper right lung atelectasis or pneumonic infiltrate. EXAM: CT CHEST WITHOUT CONTRAST TECHNIQUE: Multidetector CT imaging of the chest was performed following the standard protocol without IV contrast. RADIATION DOSE REDUCTION: This exam was performed according to the departmental dose-optimization program which includes automated exposure control, adjustment of the mA and/or kV according to patient size and/or use of iterative reconstruction technique. COMPARISON:  Chest CT without contrast dated 10/05/2020 and 09/19/2019 FINDINGS: Cardiovascular: There is mild cardiomegaly. There is minimal pericardial effusion. There are scattered three-vessel coronary artery calcifications, prominent pulmonary trunk 3.4 cm again indicating arterial hypertension. There is mild aortic atherosclerosis. Ectatic ascending segment 3.8 cm with remainder within  normal caliber limits and mild calcifications in the branch arteries with descending tortuosity. Central pulmonary veins are normal caliber. Mediastinum/Nodes: 4 cm mass right lobe of thyroid with few dystrophic calcifications anteriorly is unchanged. There is increased prominence of mediastinal nodes, for example pretracheal lymph node right of midline measures 1.4 cm in short axis on series 2 axial 47, previously 0.8 cm. Left-sided prevascular space index node is now 1.1 cm in short axis, previously 0.8 cm. Right paratracheal lymph node is now 1.1 cm on axial 28, previously 0.8 cm. Subcarinal nodal conglomerate to the right measuring 1.6 cm in short axis on axial 76, previously 1.1 cm. No significant hilar adenopathy is  suspected. Axillary spaces clear. The esophagus is unremarkable. Lungs/Pleura: There is no pleural effusion, thickening or pneumothorax. There are minimal paraseptal emphysematous changes in the lung apices. Patchy subpleural reticulation of the lung fields with a predominance in the upper zones continues to be noted with additional peribronchovascular reticulation and with mild features of honeycombing laterally in the upper lobes with traction bronchiolectasis. Today there are increased patchy ground-glass opacities in the left lower and right upper lobes. There are superimposed chronic ground-glass interstitial changes in portions of all pulmonary lobes. Central airways are clear. Upper Abdomen: There are calcified granulomas in the liver. Old cholecystectomy. No acute abnormality is seen without contrast. Musculoskeletal: There is bridging enthesopathy in the lower thoracic spine of DISH. No destructive osseous or lytic lesions. IMPRESSION: 1. Fibrotic interstitial lung disease, with superimposed increased ground-glass opacities in the right upper and left lower lobes most likely due to multilobar pneumonia. 2. Increased prominence of mediastinal lymph nodes with examples given above. Most  likely this is reactive adenopathy. A follow-up study is recommended after treatment. 3. Cardiomegaly with prominent pulmonary trunk, borderline aneurysmal ascending aorta, with aortic and great vessel atherosclerosis and calcific CAD. 4. 4 cm right thyroid mass. Last thyroid ultrasound in our system dated 06/06/2019. Follow-up ultrasound suggested. Electronically Signed   By: Telford Nab M.D.   On: 03/25/2021 23:27   ECHOCARDIOGRAM COMPLETE  Result Date: 03/26/2021    ECHOCARDIOGRAM REPORT   Patient Name:   Alexander Nunez Date of Exam: 03/26/2021 Medical Rec #:  [id number]      Height:       71.0 in Accession #:    [id number]     Weight:       282.0 lb Date of Birth:  04-05-1945      BSA:          2.440 m Patient Age:    27 years       BP:           132/66 mmHg Patient Gender: M              HR:           78 bpm. Exam Location:  [facility] Procedure: 2D Echo, Cardiac Doppler and Color Doppler Indications:    Chest Pain  History:        Patient has prior history of Echocardiogram examinations, most                 recent 08/11/2019. Signs/Symptoms:Chest Pain and Shortness of                 Breath; Risk Factors:Hypertension and Dyslipidemia.  Sonographer:    Wenda Low Referring Phys: [id number] ASIA B [place]  Sonographer Comments: Technically difficult study due to poor echo windows. IMPRESSIONS  1. Left ventricular ejection fraction, by estimation, is 60 to 65%. The left ventricle has normal function. The left ventricle has no regional wall motion abnormalities. There is mild left ventricular hypertrophy. Left ventricular diastolic parameters are consistent with Grade I diastolic dysfunction (impaired relaxation).  2. Right ventricular systolic function is normal. The right ventricular size is normal.  3. The mitral valve was not well visualized. No evidence of mitral valve regurgitation.  4. The aortic valve was not well visualized. Aortic valve regurgitation is not visualized. Comparison(s): Changes  from prior study are noted. 08/11/2019: LVEF 55-60%. FINDINGS  Left Ventricle: Left ventricular ejection fraction, by estimation, is 60 to 65%. The left ventricle has normal function. The left  ventricle has no regional wall motion abnormalities. The left ventricular internal cavity size was normal in size. There is  mild left ventricular hypertrophy. Left ventricular diastolic parameters are consistent with Grade I diastolic dysfunction (impaired relaxation). Indeterminate filling pressures. Right Ventricle: The right ventricular size is normal. No increase in right ventricular wall thickness. Right ventricular systolic function is normal. Left Atrium: Left atrial size was normal in size. Right Atrium: Right atrial size was normal in size. Pericardium: There is no evidence of pericardial effusion. Mitral Valve: The mitral valve was not well visualized. No evidence of mitral valve regurgitation. MV peak gradient, 3.2 mmHg. The mean mitral valve gradient is 1.0 mmHg. Tricuspid Valve: The tricuspid valve is grossly normal. Tricuspid valve regurgitation is trivial. Aortic Valve: The aortic valve was not well visualized. Aortic valve regurgitation is not visualized. Aortic valve mean gradient measures 5.0 mmHg. Aortic valve peak gradient measures 8.9 mmHg. Aortic valve area, by VTI measures 3.33 cm. Pulmonic Valve: The pulmonic valve was not well visualized. Pulmonic valve regurgitation is not visualized. Aorta: The aortic root and ascending aorta are structurally normal, with no evidence of dilitation. Venous: The inferior vena cava was not well visualized. IAS/Shunts: The interatrial septum was not well visualized.  LEFT VENTRICLE PLAX 2D LVIDd:         5.50 cm     Diastology LVIDs:         2.80 cm     LV e' medial:    7.62 cm/s LV PW:         1.30 cm     LV E/e' medial:  9.3 LV IVS:        1.30 cm     LV e' lateral:   8.81 cm/s LVOT diam:     2.20 cm     LV E/e' lateral: 8.0 LV SV:         109 LV SV Index:   45 LVOT  Area:     3.80 cm  LV Volumes (MOD) LV vol d, MOD A2C: 65.1 ml LV vol d, MOD A4C: 99.2 ml LV vol s, MOD A2C: 36.4 ml LV vol s, MOD A4C: 34.6 ml LV SV MOD A2C:     28.7 ml LV SV MOD A4C:     99.2 ml LV SV MOD BP:      46.7 ml RIGHT VENTRICLE RV Basal diam:  3.05 cm RV Mid diam:    2.80 cm RV S prime:     18.80 cm/s TAPSE (M-mode): 2.9 cm LEFT ATRIUM             Index        RIGHT ATRIUM           Index LA diam:        4.90 cm 2.01 cm/m   RA Area:     17.60 cm LA Vol (A2C):   45.1 ml 18.48 ml/m  RA Volume:   37.20 ml  15.25 ml/m LA Vol (A4C):   78.8 ml 32.29 ml/m LA Biplane Vol: 62.3 ml 25.53 ml/m  AORTIC VALVE                     PULMONIC VALVE AV Area (Vmax):    3.62 cm      PV Vmax:       0.86 m/s AV Area (Vmean):   3.07 cm      PV Peak grad:  3.0 mmHg AV Area (VTI):     3.33  cm AV Vmax:           149.00 cm/s AV Vmean:          111.000 cm/s AV VTI:            0.328 m AV Peak Grad:      8.9 mmHg AV Mean Grad:      5.0 mmHg LVOT Vmax:         142.00 cm/s LVOT Vmean:        89.600 cm/s LVOT VTI:          0.287 m LVOT/AV VTI ratio: 0.88  AORTA Ao Root diam: 3.60 cm Ao Asc diam:  3.70 cm MITRAL VALVE               TRICUSPID VALVE MV Area (PHT): 2.49 cm    TR Peak grad:   23.0 mmHg MV Area VTI:   4.66 cm    TR Vmax:        240.00 cm/s MV Peak grad:  3.2 mmHg MV Mean grad:  1.0 mmHg    SHUNTS MV Vmax:       0.90 m/s    Systemic VTI:  0.29 m MV Vmean:      52.4 cm/s   Systemic Diam: 2.20 cm MV Decel Time: 305 msec MV E velocity: 70.70 cm/s MV A velocity: 83.60 cm/s MV E/A ratio:  0.85 Lyman Bishop MD Electronically signed by Lyman Bishop MD Signature Date/Time: 03/26/2021/10:54:38 AM    Final       PFT Results Latest Ref Rng & Units 08/29/2020 10/03/2019 12/27/2018  FVC-Pre L 2.65 2.90 3.03  FVC-Predicted Pre % 59 64 67  FVC-Post L 2.74 - 3.00  FVC-Predicted Post % 61 - 66  Pre FEV1/FVC % % 89 85 85  Post FEV1/FCV % % 88 - 87  FEV1-Pre L 2.36 2.47 2.57  FEV1-Predicted Pre % 72 75 77  FEV1-Post L  2.42 - 2.61  DLCO uncorrected ml/min/mmHg 19.75 21.35 22.56  DLCO UNC% % 75 81 85  DLCO corrected ml/min/mmHg 19.75 21.35 -  DLCO COR %Predicted % 75 81 -  DLVA Predicted % 116 120 125  TLC L 4.55 - 4.33  TLC % Predicted % 62 - 59  RV % Predicted % 67 - 47    No results found for: NITRICOXIDE      Assessment & Plan:   CAP (community acquired pneumonia) Completed levaquin course two days prior. Clinically improved. Will obtain repeat CXR in 4 weeks to assess for resolution/interval improvement of pna. Advised to notify of any worsening symptoms.   Patient Instructions  -Continue Albuterol inhaler 2 puffs every 6 hours as needed for shortness of breath or wheezing. Notify if symptoms persist despite rescue inhaler/neb use. -Continue mucinex 1200 mg Twice daily for chest congestion -Flutter valve 2-3 times a day    Follow up with Dr. Vaughan Browner in 4 weeks with CXR before. If symptoms do not improve or worsen, please contact office for sooner follow up or seek emergency care.    ILD (interstitial lung disease) (HCC) Breathing stable. Currently under surveillance with yearly HRCTs and PFTs. Most recent PFTs with slight decline in FVC and DLCO. Discuss plans for repeat HRCT at follow up.   Thyroid mass Incidental on CT chest. Noted to be relatively unchanged when compared to previous imaging. Last [place] 2021-advised follow up. Advised pt to follow up with PCP regarding further imaging/work up.   Essential hypertension BP stable at visit. F/u with PCP  as scheduled.   Palpitations No recurrent episodes since ED visit. Received holter monitor today so will apply and wear for 30 days then follow up with cardiology.   I spent 35 minutes of dedicated to the care of this patient on the date of this encounter to include pre-visit review of records, face-to-face time with the patient discussing conditions above, post visit ordering of testing, clinical documentation with the electronic health  record, making appropriate referrals as documented, and communicating necessary findings to members of the patients care team.  Clayton Bibles, NP 04/05/2021  Pt aware and understands NP's role.

## 2021-04-05 NOTE — Assessment & Plan Note (Addendum)
Breathing stable. Currently under surveillance with yearly HRCTs and PFTs. Most recent PFTs with slight decline in FVC and DLCO. Discuss plans for repeat HRCT at follow up.  ?

## 2021-04-05 NOTE — Assessment & Plan Note (Addendum)
Completed levaquin course two days prior. Clinically improved. Will obtain repeat CXR in 4 weeks to assess for resolution/interval improvement of pna. Advised to notify of any worsening symptoms.  ? ?Patient Instructions  ?-Continue Albuterol inhaler 2 puffs every 6 hours as needed for shortness of breath or wheezing. Notify if symptoms persist despite rescue inhaler/neb use. ?-Continue mucinex 1200 mg Twice daily for chest congestion ?-Flutter valve 2-3 times a day  ? ? ?Follow up with Dr. Vaughan Browner in 4 weeks with CXR before. If symptoms do not improve or worsen, please contact office for sooner follow up or seek emergency care. ? ? ?

## 2021-04-05 NOTE — Patient Instructions (Addendum)
-  Continue Albuterol inhaler 2 puffs every 6 hours as needed for shortness of breath or wheezing. Notify if symptoms persist despite rescue inhaler/neb use. ?-Continue mucinex 1200 mg Twice daily for chest congestion ?-Flutter valve 2-3 times a day  ? ? ?Follow up with Dr. Vaughan Browner in 4 weeks with CXR before. If symptoms do not improve or worsen, please contact office for sooner follow up or seek emergency care. ?

## 2021-04-07 DIAGNOSIS — G4733 Obstructive sleep apnea (adult) (pediatric): Secondary | ICD-10-CM | POA: Diagnosis not present

## 2021-04-16 DIAGNOSIS — I10 Essential (primary) hypertension: Secondary | ICD-10-CM | POA: Diagnosis not present

## 2021-04-16 DIAGNOSIS — Z6841 Body Mass Index (BMI) 40.0 and over, adult: Secondary | ICD-10-CM | POA: Diagnosis not present

## 2021-04-16 DIAGNOSIS — J329 Chronic sinusitis, unspecified: Secondary | ICD-10-CM | POA: Diagnosis not present

## 2021-04-16 DIAGNOSIS — M1991 Primary osteoarthritis, unspecified site: Secondary | ICD-10-CM | POA: Diagnosis not present

## 2021-04-16 DIAGNOSIS — J849 Interstitial pulmonary disease, unspecified: Secondary | ICD-10-CM | POA: Diagnosis not present

## 2021-04-18 DIAGNOSIS — H524 Presbyopia: Secondary | ICD-10-CM | POA: Diagnosis not present

## 2021-04-18 DIAGNOSIS — H25013 Cortical age-related cataract, bilateral: Secondary | ICD-10-CM | POA: Diagnosis not present

## 2021-04-18 DIAGNOSIS — H52203 Unspecified astigmatism, bilateral: Secondary | ICD-10-CM | POA: Diagnosis not present

## 2021-04-18 DIAGNOSIS — H2513 Age-related nuclear cataract, bilateral: Secondary | ICD-10-CM | POA: Diagnosis not present

## 2021-05-05 ENCOUNTER — Ambulatory Visit (HOSPITAL_COMMUNITY)
Admission: RE | Admit: 2021-05-05 | Discharge: 2021-05-05 | Disposition: A | Discharge status: Home / Self Care | Payer: Medicare HMO | Source: Ambulatory Visit | Attending: Urgent Care | Admitting: Urgent Care

## 2021-05-05 ENCOUNTER — Ambulatory Visit
Admission: EM | Admit: 2021-05-05 | Discharge: 2021-05-05 | Disposition: A | Discharge status: Home / Self Care | Payer: Medicare HMO

## 2021-05-05 DIAGNOSIS — R0902 Hypoxemia: Secondary | ICD-10-CM | POA: Diagnosis not present

## 2021-05-05 DIAGNOSIS — Z8701 Personal history of pneumonia (recurrent): Secondary | ICD-10-CM | POA: Diagnosis not present

## 2021-05-05 DIAGNOSIS — R051 Acute cough: Secondary | ICD-10-CM

## 2021-05-05 DIAGNOSIS — J849 Interstitial pulmonary disease, unspecified: Secondary | ICD-10-CM

## 2021-05-05 DIAGNOSIS — J309 Allergic rhinitis, unspecified: Secondary | ICD-10-CM

## 2021-05-05 DIAGNOSIS — R0602 Shortness of breath: Secondary | ICD-10-CM | POA: Diagnosis not present

## 2021-05-05 DIAGNOSIS — I517 Cardiomegaly: Secondary | ICD-10-CM | POA: Diagnosis not present

## 2021-05-05 DIAGNOSIS — R0981 Nasal congestion: Secondary | ICD-10-CM

## 2021-05-05 DIAGNOSIS — R059 Cough, unspecified: Secondary | ICD-10-CM | POA: Diagnosis not present

## 2021-05-05 MED ORDER — PREDNISONE 50 MG PO TABS: 50.0000 mg | ORAL_TABLET | Freq: Every day | ORAL | 0 refills | Status: DC

## 2021-05-05 NOTE — ED Triage Notes (Signed)
Pt reports nasal congestion x 4 days. Mucinex gives no relief. Pt reports his oxygen has being between 91-94% for the past 4 days.  ? ?Pt reports he was admitted at the hospital for pneumonia.  ?

## 2021-05-05 NOTE — ED Provider Notes (Addendum)
?[place] ? ? ?MRN: [id number] DOB: [birth year] ? ?Subjective:  ? ?Alexander Nunez is a 76 y.o. male presenting for 4-day history of sinus congestion, sinus pressure.  He has had a light cough.  No chest pain, shortness of breath, respiratory distress.  Patient does have a history of allergic rhinitis, interstitial lung disease, pulmonary fibrosis.  He was recently admitted in February for pneumonia.  He has had follow-up with his pulmonologist group as well.  Has not had a repeat x-ray or imaging to evaluate for resolution of his pneumonia from February.  He was treated with levofloxacin. ? ?No current facility-administered medications for this encounter. ? ?Current Outpatient Medications:  ?  albuterol (VENTOLIN HFA) 108 (90 Base) MCG/ACT inhaler, Inhale 1-2 puffs into the lungs every 4 (four) hours as needed for shortness of breath or wheezing., Disp: 6.7 g, Rfl: 0 ?  ALPRAZolam (XANAX) 0.5 MG tablet, Take 0.25-0.5 mg by mouth 3 (three) times daily as needed for sleep., Disp: , Rfl:  ?  aspirin EC 81 MG tablet, Take 81 mg by mouth daily., Disp: , Rfl:  ?  doxazosin (CARDURA) 4 MG tablet, Take 4 mg by mouth at bedtime.  , Disp: , Rfl:  ?  escitalopram (LEXAPRO) 5 MG tablet, Take 5 mg by mouth daily., Disp: , Rfl:  ?  guaiFENesin (MUCINEX) 600 MG 12 hr tablet, Take 1 tablet (600 mg total) by mouth 2 (two) times daily., Disp: 60 tablet, Rfl: 2 ?  lisinopril (PRINIVIL,ZESTRIL) 10 MG tablet, Take 10 mg by mouth every evening. , Disp: , Rfl:  ?  metoprolol tartrate (LOPRESSOR) 25 MG tablet, Take 0.5 tablets (12.5 mg total) by mouth 2 (two) times daily., Disp: , Rfl:  ?  pantoprazole (PROTONIX) 40 MG tablet, Take 40 mg by mouth daily., Disp: , Rfl:  ?  pravastatin (PRAVACHOL) 20 MG tablet, Take 20 mg by mouth every evening. , Disp: , Rfl:   ? ?Allergies  ?Allergen Reactions  ? Cephalexin Other (See Comments)  ?  fever  ? Contrast Media [Iodinated Contrast Media] Hives and Rash  ?  Had persistent rash  with only '50mg'$  benadryl prior to spinal injection on 09/03/2017, try 13 hr prep in future.  ? ? ?Past Medical History:  ?Diagnosis Date  ? Anxiety   ? Arthritis   ? back  ? Back pain, chronic   ? Colon polyp   ? Diverticulitis   ? GERD (gastroesophageal reflux disease)   ? Heart palpitations   ? High cholesterol   ? Hypertension   ? Irritable bowel syndrome   ? Osteoporosis   ? Psoriasis   ? Sleep apnea   ? c- pap  ?  ? ?Past Surgical History:  ?Procedure Laterality Date  ? BACK SURGERY  2015  ? CHOLECYSTECTOMY N/A 10/12/2015  ? Procedure: LAPAROSCOPIC CHOLECYSTECTOMY;  Surgeon: Aviva Signs, MD;  Location: [facility];  Service: General;  Laterality: N/A;  ? COLONOSCOPY    ? FOOT FRACTURE SURGERY    ? left foot  ? KNEE ARTHROSCOPY    ? left  ? TONSILLECTOMY    ? UPPER GASTROINTESTINAL ENDOSCOPY    ? ? ?Family History  ?Problem Relation Age of Onset  ? Arrhythmia Mother   ?     Atrial fib  ? Arrhythmia Sister   ?     Atrial fib  ? Stroke Sister   ? Breast cancer Sister   ? Bladder Cancer Father   ?        ?  Colon cancer Neg Hx   ? Stomach cancer Neg Hx   ? Esophageal cancer Neg Hx   ? Pancreatic cancer Neg Hx   ? Prostate cancer Neg Hx   ? Rectal cancer Neg Hx   ? ? ?Social History  ? ?Tobacco Use  ? Smoking status: Former  ?  Packs/day: 2.00  ?  Years: 11.00  ?  Pack years: 22.00  ?  Types: Cigarettes  ?  Start date: 05/22/1962  ?  Quit date: 05/21/1973  ?  Years since quitting: 47.9  ? Smokeless tobacco: Never  ? Tobacco comments:  ?  Quit 25 yrs now   ?Vaping Use  ? Vaping Use: Never used  ?Substance Use Topics  ? Alcohol use: Yes  ?  Alcohol/week: 14.0 standard drinks  ?  Types: 14 Shots of liquor per week  ?  Comment: social  ? Drug use: No  ? ? ?ROS ? ? ?Objective:  ? ?Vitals: ?BP (!) 174/80 (BP Location: Right Arm)   Pulse 62   Temp 97.8 ?F (36.6 ?C) (Oral)   Resp (!) 24   SpO2 91%  ? ?Pulse oximetry ranged from 91%-97% and hovered mainly over 93%-94% at rest. ? ?Physical Exam ?Constitutional:   ?   General: He is  not in acute distress. ?   Appearance: Normal appearance. He is well-developed. He is not ill-appearing, toxic-appearing or diaphoretic.  ?HENT:  ?   Head: Normocephalic and atraumatic.  ?   Right Ear: External ear normal.  ?   Left Ear: External ear normal.  ?   Nose: Congestion present.  ?   Mouth/Throat:  ?   Mouth: Mucous membranes are moist.  ?Eyes:  ?   General: No scleral icterus.    ?   Right eye: No discharge.     ?   Left eye: No discharge.  ?   Extraocular Movements: Extraocular movements intact.  ?Cardiovascular:  ?   Rate and Rhythm: Normal rate and regular rhythm.  ?   Heart sounds: Normal heart sounds. No murmur heard. ?  No friction rub. No gallop.  ?Pulmonary:  ?   Effort: Pulmonary effort is normal. No respiratory distress.  ?   Breath sounds: Normal breath sounds. No stridor. No wheezing, rhonchi or rales.  ?Neurological:  ?   Mental Status: He is alert and oriented to person, place, and time.  ?Psychiatric:     ?   Mood and Affect: Mood normal.     ?   Behavior: Behavior normal.     ?   Thought Content: Thought content normal.  ? ? ?DG Chest 2 View ? ?Result Date: 03/25/2021 ?CLINICAL DATA:  Chest pressure and palpitations. EXAM: CHEST - 2 VIEW COMPARISON:  None. FINDINGS: Moderate to marked severity diffuse, chronic appearing increased interstitial lung markings are seen. Superimposed component of atelectasis and/or infiltrate are noted within the bilateral lung bases and mid to upper right lung. There is no evidence of a pleural effusion or pneumothorax. The heart size and mediastinal contours are within normal limits. Surgical clips are seen within the right upper quadrant. Degenerative changes are noted throughout the thoracic spine. IMPRESSION: Chronic appearing increased interstitial lung markings with superimposed bibasilar and mid to upper right lung atelectasis and/or infiltrate. Electronically Signed   By: Virgina Norfolk M.D.   On: 03/25/2021 19:19  ? ?CT Chest Wo Contrast ? ?Result  Date: 03/25/2021 ?CLINICAL DATA:  Abnormal CXR with chest pressure. Suspected mid to upper right  lung atelectasis or pneumonic infiltrate. EXAM: CT CHEST WITHOUT CONTRAST TECHNIQUE: Multidetector CT imaging of the chest was performed following the standard protocol without IV contrast. RADIATION DOSE REDUCTION: This exam was performed according to the departmental dose-optimization program which includes automated exposure control, adjustment of the mA and/or kV according to patient size and/or use of iterative reconstruction technique. COMPARISON:  Chest CT without contrast dated 10/05/2020 and 09/19/2019 FINDINGS: Cardiovascular: There is mild cardiomegaly. There is minimal pericardial effusion. There are scattered three-vessel coronary artery calcifications, prominent pulmonary trunk 3.4 cm again indicating arterial hypertension. There is mild aortic atherosclerosis. Ectatic ascending segment 3.8 cm with remainder within normal caliber limits and mild calcifications in the branch arteries with descending tortuosity. Central pulmonary veins are normal caliber. Mediastinum/Nodes: 4 cm mass right lobe of thyroid with few dystrophic calcifications anteriorly is unchanged. There is increased prominence of mediastinal nodes, for example pretracheal lymph node right of midline measures 1.4 cm in short axis on series 2 axial 47, previously 0.8 cm. Left-sided prevascular space index node is now 1.1 cm in short axis, previously 0.8 cm. Right paratracheal lymph node is now 1.1 cm on axial 28, previously 0.8 cm. Subcarinal nodal conglomerate to the right measuring 1.6 cm in short axis on axial 76, previously 1.1 cm. No significant hilar adenopathy is suspected. Axillary spaces clear. The esophagus is unremarkable. Lungs/Pleura: There is no pleural effusion, thickening or pneumothorax. There are minimal paraseptal emphysematous changes in the lung apices. Patchy subpleural reticulation of the lung fields with a predominance in  the upper zones continues to be noted with additional peribronchovascular reticulation and with mild features of honeycombing laterally in the upper lobes with traction bronchiolectasis. Today there are inc

## 2021-05-06 ENCOUNTER — Encounter: Payer: Self-pay | Admitting: Pulmonary Disease

## 2021-05-06 ENCOUNTER — Ambulatory Visit: Payer: Medicare HMO | Admitting: Pulmonary Disease

## 2021-05-06 VITALS — BP 140/82 | HR 63 | Temp 97.9°F | Ht 71.0 in | Wt 283.0 lb

## 2021-05-06 DIAGNOSIS — G4733 Obstructive sleep apnea (adult) (pediatric): Secondary | ICD-10-CM | POA: Diagnosis not present

## 2021-05-06 DIAGNOSIS — J849 Interstitial pulmonary disease, unspecified: Secondary | ICD-10-CM | POA: Diagnosis not present

## 2021-05-06 DIAGNOSIS — J189 Pneumonia, unspecified organism: Secondary | ICD-10-CM

## 2021-05-06 MED ORDER — AZITHROMYCIN 250 MG PO TABS: ORAL_TABLET | ORAL | 0 refills | Status: DC

## 2021-05-06 NOTE — Progress Notes (Signed)
? ?      ?Alexander Nunez    [id number]    05/02/1945 ? ?Primary Care Physician:Fusco, Purcell Nails, MD ? ?Referring Physician: Redmond School, MD ?[address] ?[place],  [place] [address] ? ?Chief complaint: Follow-up for ILD, pulmonary fibrosis ? ?HPI: ?76 year old with history of hypertension, hyperlipidemia, sleep apnea, psoriasis ?Had an incidental finding of interstitial lung disease on CT abdomen which was done for diverticulitis ?He has seen Dr. Velvet Bathe at [facility] who did a high resolution CT with findings of alternate diagnosis, possible HP.  He had HP panel on 12/28/2018 which is negative. ?History notable for psoriasis with skin involvement.  He was on methotrexate for a couple of years and stopped as his skin rash improved.  Last dose of methotrexate was several years ago. ? ?Had COVID-19 infection December 2021, referred for monoclonal antibody ? ?Pets: Has a dog.  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  His hobbies include gardening but no significant exposure to mold,No hot tub, Jacuzzi, no down pillows or comforters.  He uses talcum powder every day.  May have had Freon leak from his HVAC system. Likes to play golf.   ?Smoking history: 30-pack-year smoker.  Quit in 1980 ?Travel history: No significant travel history ?Relevant family history: No significant family history of lung disease. ? ?Interval history: ?Hospitalized in February 2023 with community-acquired pneumonia.  Treated with antibiotics ?Followed up in pulmonary clinic with Roxan Diesel who prescribed mucociliary clearance and albuterol inhaler ? ?Overall breathing is improved but for the past 1 week he has had sinus congestion, sinus pressure.  He was given doxycycline and Medrol but developed a rash and stopped these medications after  few days with worsening of symptoms.  Evaluated in the ED on 05/05/2021 which did not show any acute process. ? ?Outpatient Encounter Medications as of 05/06/2021  ?Medication Sig  ? albuterol (VENTOLIN HFA) 108 (90 Base) MCG/ACT inhaler Inhale 1-2 puffs into the lungs every 4 (four) hours as needed for shortness of breath or wheezing.  ? ALPRAZolam (XANAX) 0.5 MG tablet Take 0.25-0.5 mg by mouth 3 (three) times daily as needed for sleep.  ? aspirin EC 81 MG tablet Take 81 mg by mouth daily.  ? doxazosin (CARDURA) 4 MG tablet Take 4 mg by mouth at bedtime.    ? escitalopram (LEXAPRO) 5 MG tablet Take 5 mg by mouth daily.  ? guaiFENesin (MUCINEX) 600 MG 12 hr tablet Take 1 tablet (600 mg total) by mouth 2 (two) times daily.  ? lisinopril (PRINIVIL,ZESTRIL) 10 MG tablet Take 10 mg by mouth every evening.   ? metoprolol tartrate (LOPRESSOR) 25 MG tablet Take 0.5 tablets (12.5 mg total) by mouth 2 (two) times daily.  ? pantoprazole (PROTONIX) 40 MG tablet Take 40 mg by mouth daily.  ? pravastatin (PRAVACHOL) 20 MG tablet Take 20 mg by mouth every evening.   ? predniSONE (DELTASONE) 50 MG tablet Take 1 tablet (50 mg total) by mouth daily with breakfast.  ? methylPREDNISolone (MEDROL DOSEPAK) 4 MG TBPK tablet TAKE 6 TABLETS ON DAY 1 AS DIRECTED ON PACKAGE AND DECREASE BY 1 TAB EACH DAY FOR A TOTAL OF 6 DAYS  ? ?No facility-administered encounter medications on file as of 05/06/2021.  ? ?Physical Exam: ?Blood pressure 140/82, pulse 63, temperature 97.9 ?F (36.6 ?C), temperature source Oral, height  $'5\' 11"'X$  (1.803 m), weight 283 lb (128.4 kg), SpO2 96 %. ?Gen:      No acute distress ?HEENT:  EOMI, sclera anicteric ?Neck:     No masses; no thyromegaly ?Lungs:    Clear to auscultation bilaterally; normal respiratory effort ?CV:         Regular rate and rhythm; no murmurs ?Abd:      + bowel sounds; soft, non-tender; no palpable masses, no distension ?Ext:    No edema; adequate peripheral perfusion ?Skin:      Warm and dry; no  rash ?Neuro: alert and oriented x 3 ?Psych: normal mood and affect  ? ?Data Reviewed: ?Imaging: ?High-resolution CT 12/24/2018-mild emphysema, traction bronchiectasis, patchy groundglass and reticulation.  More prominent in the upper lobes with no basal gradient.  Air-trapping present. ?High-res CT 09/19/2019-stable to minimally changed interstitial lung disease in alternate pattern ? ?CT 03/25/2021-interstitial lung disease with superimposed groundglass opacities in the right upper and left lower lobe. ? ?Chest x-ray 05/05/2021-unchanged chronic interstitial changes.  No acute process. ?I have reviewed the images personally. ? ?PFTs:. ?01/06/2019 ?FVC 3 [66%], FEV1 2.61 [78%], F/F 87, TLC 4.33 [59%], DLCO 22.56 [85%] ?Severe restriction with no obstruction.  Diffusion capacity is normal ? ?10/03/2019 ?FVC 2.90 [64%], FEV1 2.47 [94%], DLCO 21.35 [81%] ?Moderate restriction ? ?08/29/2020 ?FVC 2.14 [1%], FEV1 2.42 [74%], TLC 4.55 [62%], DLCO 19.75 [75%] ?Moderate restriction, mild diffusion defect ? ?Labs: ?Hypersensitivity panel 12/28/2018- Negative ?CTD profile 02/10/2019-negative ? ?Assessment:  ?Recent admission for committee acquired pneumonia ?Acute sinusitis ?Has recovered well from his pneumonia but now has sinus congestion and pressure ?He did not complete doxycycline and steroids which were given by his outpatient ?We will treat with Z-Pak and steroid nasal spray.  He will use Nasonex over-the-counter or his old prescription of Flonase ? ?Interstitial lung disease ?CT scan reviewed with alternate pattern of pulmonary fibrosis with upper lobe predominance.  This could be hypersensitivity pneumonitis though he does not have any significant exposures ? ?Lab work including CTD serologies, hypersensitivity panel is negative.  Has remote exposure to asbestos but CT scan is not typical of asbestosis. ? ?I reviewed his latest PFTs in detail.  There may be slight worsening but not convincing ?Discussed for options for further  work-up in detail with patient, including wait and watch with monitoring, bronchoscope with BAL, transbronchial biopsies or surgical lung biopsy. ? ?After informed decision making we have elected to monitor ?Since he is asymptomatic and doing well with regard to his respiratory status we have decided to not work this up at present. If he should develop symptoms of worsening imaging, PFTs then we can reconsider. ? ?Follow-up high-resolution CT in 6 months ? ?OSA ?He has diagnosis of OSA 30 years ago and is on CPAP at home.  He will need replacement ?Order home sleep study for reassessment ? ?Obesity ?He has weight gain recently which is suspect is contributing to his restriction and dyspnea ?Advised weight loss with diet and exercise ? ?Plan/Recommendations: ?- Z-Pak, steroid nasal spray ?- Follow-up high-resolution CT in 6 months ?- Home sleep study ?  ?Marshell Garfinkel MD ?[facility] ?05/06/2021, 11:51 AM ? ?CC: Redmond School, MD ? ? ?

## 2021-05-06 NOTE — Patient Instructions (Signed)
We will prescribe Z-Pak ?Use Flonase or Nasonex over-the-counter to reduce nasal congestion ?Will order home sleep study for evaluation of sleep apnea ?High-res CT and follow-up in 6 months. ?

## 2021-05-08 NOTE — Progress Notes (Incomplete)
CARDIOLOGY CONSULT NOTE  ? ? ? ? ? ?Patient ID: ?Alexander Nunez ?MRN: [id number] ?DOB/AGE: 1945-09-19 76 y.o. ? ?Admit date: (Not on file) ?Referring Physician: Gerarda Fraction ?Primary Physician: Redmond School, MD ?Primary Cardiologist: New ?Reason for Consultation: Palpitations ? ?Active Problems: ?  * No active hospital problems. * ? ? ?HPI:  76 y.o. referred by Dr Gerarda Fraction for palpitations. June 2021  History of anxiety, HLD, HTN, and OSA. During office visit complained of moderate exertional dyspnea and wheezing This was associated with palpitations He is a distant smoker His CXR showed chronic ILD on 05/18/19 Seen by [facility] 05/18/19 Noted multiple pulmonary issues including OSA, ILD and reactive airway disease former smoker. Cough / congestion worse feels like his wind pipe was getting smaller Tried xanax and allergy medicine without improvement  Connective tissue serologies have been negative  ? ?No chest pain Likes to golf and son works for [employer] but has had lumbar fusion and back pain Limits his activity Retired Solicitor to work with [employer] ?Went to [employer]  ? ?Admitted February 2023 with pneumonia RX Cipro Had some chest pain and palpitations More productive cough CXR/CT noted penumonia ECG/Troponin and echo unchanged No arrhythmia on telemetry other than PAC/PVCls Rx with baseline beta blockers and outpatient monitor ordered  ? ?TTE 03/06/21 EF 60-65% RV normal no valve dx ? ?*** ? ? ?ROS ?All other systems reviewed and negative except as noted above ? ?Past Medical History:  ?Diagnosis Date  ?? Anxiety   ?? Arthritis   ? back  ?? Back pain, chronic   ?? Colon polyp   ?? Diverticulitis   ?? GERD (gastroesophageal reflux disease)   ?? Heart palpitations   ?? High cholesterol   ?? Hypertension   ?? Irritable bowel syndrome   ?? Osteoporosis   ?? Psoriasis   ?? Sleep apnea   ? c- pap  ?  ?Family History  ?Problem Relation Age of Onset  ?? Arrhythmia Mother   ?     Atrial  fib  ?? Arrhythmia Sister   ?     Atrial fib  ?? Stroke Sister   ?? Breast cancer Sister   ?? Bladder Cancer Father   ?        ?? Colon cancer Neg Hx   ?? Stomach cancer Neg Hx   ?? Esophageal cancer Neg Hx   ?? Pancreatic cancer Neg Hx   ?? Prostate cancer Neg Hx   ?? Rectal cancer Neg Hx   ?  ?Social History  ? ?Socioeconomic History  ?? Marital status: Married  ?  Spouse name: Not on file  ?? Number of children: 2  ?? Years of education: Not on file  ?? Highest education level: Not on file  ?Occupational History  ?? Occupation: retired  ?  Comment: Retired  ?Tobacco Use  ?? Smoking status: Former  ?  Packs/day: 2.00  ?  Years: 11.00  ?  Pack years: 22.00  ?  Types: Cigarettes  ?  Start date: 05/22/1962  ?  Quit date: 05/21/1973  ?  Years since quitting: 47.9  ?? Smokeless tobacco: Never  ?? Tobacco comments:  ?  Quit 25 yrs now   ?Vaping Use  ?? Vaping Use: Never used  ?Substance and Sexual Activity  ?? Alcohol use: Yes  ?  Alcohol/week: 14.0 standard drinks  ?  Types: 14 Shots of liquor per week  ?  Comment: social  ?? Drug use: No  ??  Sexual activity: Yes  ?  Partners: Female  ?Other Topics Concern  ?? Not on file  ?Social History Narrative  ? Has dog named Rockydooal  ? ?Social Determinants of Health  ? ?Financial Resource Strain: Not on file  ?Food Insecurity: Not on file  ?Transportation Needs: Not on file  ?Physical Activity: Not on file  ?Stress: Not on file  ?Social Connections: Not on file  ?Intimate Partner Violence: Not on file  ?  ?Past Surgical History:  ?Procedure Laterality Date  ?? BACK SURGERY  2015  ?? CHOLECYSTECTOMY N/A 10/12/2015  ? Procedure: LAPAROSCOPIC CHOLECYSTECTOMY;  Surgeon: Aviva Signs, MD;  Location: [facility];  Service: General;  Laterality: N/A;  ?? COLONOSCOPY    ?? FOOT FRACTURE SURGERY    ? left foot  ?? KNEE ARTHROSCOPY    ? left  ?? TONSILLECTOMY    ?? UPPER GASTROINTESTINAL ENDOSCOPY    ?  ? ? ?Current Outpatient Medications:  ??  albuterol (VENTOLIN HFA) 108 (90 Base) MCG/ACT  inhaler, Inhale 1-2 puffs into the lungs every 4 (four) hours as needed for shortness of breath or wheezing., Disp: 6.7 g, Rfl: 0 ??  ALPRAZolam (XANAX) 0.5 MG tablet, Take 0.25-0.5 mg by mouth 3 (three) times daily as needed for sleep., Disp: , Rfl:  ??  aspirin EC 81 MG tablet, Take 81 mg by mouth daily., Disp: , Rfl:  ??  azithromycin (ZITHROMAX) 250 MG tablet, Take as directed, Disp: 6 tablet, Rfl: 0 ??  doxazosin (CARDURA) 4 MG tablet, Take 4 mg by mouth at bedtime.  , Disp: , Rfl:  ??  escitalopram (LEXAPRO) 5 MG tablet, Take 5 mg by mouth daily., Disp: , Rfl:  ??  guaiFENesin (MUCINEX) 600 MG 12 hr tablet, Take 1 tablet (600 mg total) by mouth 2 (two) times daily., Disp: 60 tablet, Rfl: 2 ??  lisinopril (PRINIVIL,ZESTRIL) 10 MG tablet, Take 10 mg by mouth every evening. , Disp: , Rfl:  ??  methylPREDNISolone (MEDROL DOSEPAK) 4 MG TBPK tablet, TAKE 6 TABLETS ON DAY 1 AS DIRECTED ON PACKAGE AND DECREASE BY 1 TAB EACH DAY FOR A TOTAL OF 6 DAYS, Disp: , Rfl:  ??  metoprolol tartrate (LOPRESSOR) 25 MG tablet, Take 0.5 tablets (12.5 mg total) by mouth 2 (two) times daily., Disp: , Rfl:  ??  pantoprazole (PROTONIX) 40 MG tablet, Take 40 mg by mouth daily., Disp: , Rfl:  ??  pravastatin (PRAVACHOL) 20 MG tablet, Take 20 mg by mouth every evening. , Disp: , Rfl:  ??  predniSONE (DELTASONE) 50 MG tablet, Take 1 tablet (50 mg total) by mouth daily with breakfast., Disp: 5 tablet, Rfl: 0 ? ? ? ?Physical Exam: ?There were no vitals taken for this visit.  ? ?Affect appropriate ?Overweight white male  ?HEENT: normal ?Neck supple with no adenopathy ?JVP normal no bruits no thyromegaly ?Lungs clear with no wheezing and good diaphragmatic motion ?Heart:  S1/S2 no murmur, no rub, gallop or click ?PMI normal ?Abdomen: benighn, BS positve, no tenderness, no AAA ?no bruit.  No HSM or HJR ?Distal pulses intact with no bruits ?No edema ?Neuro non-focal ?Skin warm and dry ?No muscular weakness ?Previous lumbar fusion  ? ?Labs: ?   ?Lab Results  ?Component Value Date  ? WBC 8.1 03/26/2021  ? HGB 12.2 (L) 03/26/2021  ? HCT 37.3 (L) 03/26/2021  ? MCV 94.2 03/26/2021  ? PLT 263 03/26/2021  ? No results for input(s): NA, K, CL, CO2, BUN, CREATININE, CALCIUM, PROT, BILITOT, ALKPHOS,  ALT, AST, GLUCOSE in the last 168 hours. ? ?Invalid input(s): LABALBU ?Lab Results  ?Component Value Date  ? TROPONINI <0.03 07/11/2017  ?  ?Lab Results  ?Component Value Date  ? CHOL 140 03/26/2021  ? ?Lab Results  ?Component Value Date  ? HDL 43 03/26/2021  ? ?Lab Results  ?Component Value Date  ? [place] 83 03/26/2021  ? ?Lab Results  ?Component Value Date  ? TRIG 71 03/26/2021  ? ?Lab Results  ?Component Value Date  ? CHOLHDL 3.3 03/26/2021  ? ?No results found for: LDLDIRECT  ?  ?Radiology: ?DG Chest 2 View ? ?Result Date: 05/05/2021 ?CLINICAL DATA:  Cough and shortness of breath for several days. EXAM: CHEST - 2 VIEW COMPARISON:  03/25/2021 chest CT, radiograph and prior studies FINDINGS: Cardiomegaly again noted. Diffuse bilateral interstitial opacities are not significantly changed. No new pulmonary opacities are identified. No pleural effusion, mass or pneumothorax noted. No acute bony abnormalities are present. IMPRESSION: 1. No evidence of acute cardiopulmonary disease. 2. Unchanged chronic diffuse bilateral interstitial opacities/fibrosis. Electronically Signed   By: Margarette Canada M.D.   On: 05/05/2021 10:42   ? ?EKG: SR rate 65 ICRBBB low voltage  07/26/19 SR rate 65 normal  ? ? ?ASSESSMENT AND PLAN:  ? ?1. Palpitations:  Related to flair of lung disease Already on beta blocker Telemetry in hospital with PAC/PVCls TTE unchanged normal EF *** ?2. Pulmonary:  F/u [facility] with CT scan has ILD, OSA former smoker with allergic flairs Also with sinus issues.  PFTls 2022 with moderate restriction and mild diffusion defect F/U Dr Vaughan Browner Deferred Bronchoscopy / BAL  ?3. HLD:  Continue statin labs with primary  ?4. HTN:  Continue beta blocker and ACE low sodium DASH diet  stable  ?5. Obesity: discussed low carb diet Both his breathing / back would be much better with weight loss ?6. Lumbar: previous L45 fusion with residual L3 disease 6 years ago f/u Dr Carloyn Manner  ? ?14 day event

## 2021-05-10 ENCOUNTER — Encounter: Payer: Self-pay | Admitting: Nurse Practitioner

## 2021-05-10 ENCOUNTER — Telehealth: Payer: Self-pay | Admitting: Pulmonary Disease

## 2021-05-10 ENCOUNTER — Telehealth (INDEPENDENT_AMBULATORY_CARE_PROVIDER_SITE_OTHER): Payer: Medicare HMO | Admitting: Nurse Practitioner

## 2021-05-10 DIAGNOSIS — J22 Unspecified acute lower respiratory infection: Secondary | ICD-10-CM | POA: Diagnosis not present

## 2021-05-10 DIAGNOSIS — J849 Interstitial pulmonary disease, unspecified: Secondary | ICD-10-CM | POA: Diagnosis not present

## 2021-05-10 DIAGNOSIS — J019 Acute sinusitis, unspecified: Secondary | ICD-10-CM | POA: Diagnosis not present

## 2021-05-10 DIAGNOSIS — G4733 Obstructive sleep apnea (adult) (pediatric): Secondary | ICD-10-CM | POA: Diagnosis not present

## 2021-05-10 DIAGNOSIS — B9689 Other specified bacterial agents as the cause of diseases classified elsewhere: Secondary | ICD-10-CM

## 2021-05-10 MED ORDER — AMOXICILLIN-POT CLAVULANATE 875-125 MG PO TABS: 1.0000 | ORAL_TABLET | Freq: Two times a day (BID) | ORAL | 0 refills | Status: AC

## 2021-05-10 MED ORDER — AZELASTINE HCL 0.1 % NA SOLN: 2.0000 | Freq: Two times a day (BID) | NASAL | 3 refills | Status: DC | PRN

## 2021-05-10 MED ORDER — BENZONATATE 200 MG PO CAPS: 200.0000 mg | ORAL_CAPSULE | Freq: Three times a day (TID) | ORAL | 1 refills | Status: DC | PRN

## 2021-05-10 NOTE — Assessment & Plan Note (Signed)
Currently on CPAP therapy with good benefit. Needs new CPAP given age of machine. Awaiting HST results.  ?

## 2021-05-10 NOTE — Assessment & Plan Note (Signed)
Recent CXR without acute process. Rash developed with doxycycline so never completed course. Limited improvement with azithromycin. Will treat with augmentin course. Advised to notify if symptoms do not improve and come in for a repeat CXR - verbalized understanding. Supportive care measures for cough and nasal congestion.  ? ?Patient Instructions  ?-Continue Albuterol inhaler 2 puffs every 6 hours as needed for shortness of breath or wheezing. Notify if symptoms persist despite rescue inhaler/neb use. ?-Continue protonix 40 mg daily  ?-Continue fluticasone nasal sprays 2 sprays each nostril daily  ?-Continue loratidine (Claritin) 10 mg daily for allergies  ? ?-Saline nasal irrigations 1-2 times a day  ?-Mucinex DM 600 mg Twice daily  ?-Augmentin 875 Twice daily for 7 days. Notify immediately of any rash, itching, hives, or swelling, or seek emergency care. ?Finish your antibiotics in their entirety. Do not stop just because symptoms improve.  ?Take with food to reduce GI upset. ?-Tessalon Perles (benzonatate) 1 capsule Three times a day as needed for cough  ?-Astelin nasal spray 2 sprays each nostril Twice daily as needed for nasal congestion  ? ?Follow up in 3-4 weeks with Dr. Vaughan Browner or Alanson Aly. If symptoms do not improve or worsen, please contact office for sooner follow up or seek emergency care. ? ? ?

## 2021-05-10 NOTE — Patient Instructions (Addendum)
-  Continue Albuterol inhaler 2 puffs every 6 hours as needed for shortness of breath or wheezing. Notify if symptoms persist despite rescue inhaler/neb use. ?-Continue protonix 40 mg daily  ?-Continue fluticasone nasal sprays 2 sprays each nostril daily  ?-Continue loratidine (Claritin) 10 mg daily for allergies  ? ?-Saline nasal irrigations 1-2 times a day  ?-Mucinex DM 600 mg Twice daily  ?-Augmentin 875 Twice daily for 7 days. Notify immediately of any rash, itching, hives, or swelling, or seek emergency care. ?Finish your antibiotics in their entirety. Do not stop just because symptoms improve.  ?Take with food to reduce GI upset. ?-Tessalon Perles (benzonatate) 1 capsule Three times a day as needed for cough  ?-Astelin nasal spray 2 sprays each nostril Twice daily as needed for nasal congestion  ? ?Follow up in 3-4 weeks with Dr. Vaughan Browner or Alanson Aly. If symptoms do not improve or worsen, please contact office for sooner follow up or seek emergency care. ?

## 2021-05-10 NOTE — Telephone Encounter (Signed)
Spoke with the pt  ?He finished Zpack and wheezing and cough are worse  ?Sputum is clear  ?He feels worse in general  ?Virtual visit with [place] today at 11 am  ?

## 2021-05-10 NOTE — Assessment & Plan Note (Signed)
Breathing stable. Continue with surveillance given clinical stability. Plans for HRCT in 6 months.  ?

## 2021-05-10 NOTE — Progress Notes (Signed)
? ?Patient ID: Alexander Nunez, male     DOB: December 15, 1945, 76 y.o.      MRN: [id number] ? ?Chief Complaint  ?Patient presents with  ? Follow-up  ?  Cough and wheezing  ? ? ?Virtual Visit via Video Note ? ?I connected with Alexander Nunez on 05/10/21 at 11:00 AM EDT by a video enabled telemedicine application and verified that I am speaking with the correct person using two identifiers. ? ?Location: ?Patient: Home ?Provider: Office ?  ?I discussed the limitations of evaluation and management by telemedicine and the availability of in person appointments. The patient expressed understanding and agreed to proceed. ? ?History of Present Illness: ?76 year old male, former smoker (22 pack years) followed for ILD.  He is a patient Dr. Matilde Bash and was last seen in office on 05/06/2021.  Past medical history significant for hypertension, HLD, sleep apnea, psoriasis previously treated with methotrexate years ago. ? ?03/25/2021-03/26/2021: ED visit for atypical chest pains and palpitations-no ACS but found to have superimposed pneumonia on CXR and CT.  Discharged on 7-day Levaquin course.  Advised to return if symptoms worsened or did not improve follow-up with pulm outpatient.  Referred to cardiology with heart monitor for evaluation of palpitations. ? ?04/05/2021: OV with Merary Garguilo NP for hospital follow-up.  Reported feeling better after completing Levaquin.  Still had some mild chest congestion and cough.  Advised mucociliary clearance with Mucinex and flutter valve.  Continued albuterol as needed.  Plans for follow-up CXR in 4 weeks.  He was currently under surveillance for ILD with yearly HRCT's and PFTs.  Most recent PFT had slight decline in FVC and DLCO.  Discussed plans for repeat HRCT at follow-up visit.  Follow-up with cardiology as scheduled. ? ?05/06/2021: OV with Dr. Vaughan Browner.  Recently seen at urgent care for increased sinus congestion and sinus pressure.  He was provided with doxycycline and Medrol but developed a rash and  stopped these after a few days.  CXR in ED did not show any acute process.  Treated at visit for acute sinusitis with Z-Pak and steroid nasal spray.  Elected to continue to monitor ILD given stability of symptoms.  Plans for HRCT in 6 months with plans to repeat PFTs if significant worsening on scan.  Has been on CPAP for approximately 30 years; required replacement so HST was ordered. ? ?05/10/2021: Today-acute visit ?Patient presents today via virtual visit due to persistent nasal congestion and new chest congestion with cough.  He continues to have nasal congestion and drainage which is white to yellow.  His also now began coughing more frequently and feels like he has more congestion in his chest.  Cough is productive at times with clear to white sputum. He has some sinus pressure but denies any headaches. No recent fevers or fatigue. His breathing is stable without any shortness of breath or wheezing. He completed his z pack this morning and doesn't feel like it did much for his symptoms. He continues to take loratidine and use flonase daily. He has not required used of his albuterol inhaler.  ? ?Allergies  ?Allergen Reactions  ? Cephalexin Other (See Comments)  ?  fever  ? Contrast Media [Iodinated Contrast Media] Hives and Rash  ?  Had persistent rash with only '50mg'$  benadryl prior to spinal injection on 09/03/2017, try 13 hr prep in future.  ? ?Immunization History  ?Administered Date(s) Administered  ? Influenza, High Dose Seasonal PF 10/11/2018  ? PFIZER(Purple Top)SARS-COV-2 Vaccination 03/11/2019, 04/01/2019  ? Pneumococcal  Polysaccharide-23 10/10/2016  ? Tdap 08/14/2010, 07/25/2018  ? ?Past Medical History:  ?Diagnosis Date  ? Anxiety   ? Arthritis   ? back  ? Back pain, chronic   ? Colon polyp   ? Diverticulitis   ? GERD (gastroesophageal reflux disease)   ? Heart palpitations   ? High cholesterol   ? Hypertension   ? Irritable bowel syndrome   ? Osteoporosis   ? Psoriasis   ? Sleep apnea   ? c- pap   ? ? ?Tobacco History: ?Social History  ? ?Tobacco Use  ?Smoking Status Former  ? Packs/day: 2.00  ? Years: 11.00  ? Pack years: 22.00  ? Types: Cigarettes  ? Start date: 05/22/1962  ? Quit date: 05/21/1973  ? Years since quitting: 48.0  ?Smokeless Tobacco Never  ?Tobacco Comments  ? Quit 25 yrs now   ? ?Counseling given: Not Answered ?Tobacco comments: Quit 25 yrs now  ? ? ?Outpatient Medications Prior to Visit  ?Medication Sig Dispense Refill  ? albuterol (VENTOLIN HFA) 108 (90 Base) MCG/ACT inhaler Inhale 1-2 puffs into the lungs every 4 (four) hours as needed for shortness of breath or wheezing. 6.7 g 0  ? ALPRAZolam (XANAX) 0.5 MG tablet Take 0.25-0.5 mg by mouth 3 (three) times daily as needed for sleep.    ? aspirin EC 81 MG tablet Take 81 mg by mouth daily.    ? azithromycin (ZITHROMAX) 250 MG tablet Take as directed 6 tablet 0  ? doxazosin (CARDURA) 4 MG tablet Take 4 mg by mouth at bedtime.      ? escitalopram (LEXAPRO) 5 MG tablet Take 5 mg by mouth daily.    ? guaiFENesin (MUCINEX) 600 MG 12 hr tablet Take 1 tablet (600 mg total) by mouth 2 (two) times daily. 60 tablet 2  ? lisinopril (PRINIVIL,ZESTRIL) 10 MG tablet Take 10 mg by mouth every evening.     ? methylPREDNISolone (MEDROL DOSEPAK) 4 MG TBPK tablet TAKE 6 TABLETS ON DAY 1 AS DIRECTED ON PACKAGE AND DECREASE BY 1 TAB EACH DAY FOR A TOTAL OF 6 DAYS    ? metoprolol tartrate (LOPRESSOR) 25 MG tablet Take 0.5 tablets (12.5 mg total) by mouth 2 (two) times daily.    ? pantoprazole (PROTONIX) 40 MG tablet Take 40 mg by mouth daily.    ? pravastatin (PRAVACHOL) 20 MG tablet Take 20 mg by mouth every evening.     ? predniSONE (DELTASONE) 50 MG tablet Take 1 tablet (50 mg total) by mouth daily with breakfast. 5 tablet 0  ? ?No facility-administered medications prior to visit.  ? ?  ?Review of Systems:  ? ?Constitutional: No weight loss or gain, night sweats, fevers, chills, fatigue, or lassitude. ?HEENT: No headaches, difficulty swallowing, tooth/dental  problems, or sore throat. No sneezing, itching, ear ache. +nasal congestion/sinus pressure, nasal drainage  ?CV:  No chest pain, orthopnea, PND, swelling in lower extremities, anasarca, dizziness, palpitations, syncope ?Resp: +cough, occasionally productive; chest congestion. No shortness of breath with exertion or at rest. No hemoptysis. No wheezing.  No chest wall deformity ?Skin: No rash, lesions, ulcerations ?Neuro: No dizziness or lightheadedness.  ?Psych: No depression or anxiety. Mood stable.  ? ?Observations/Objective: ?Patient is well-developed, well-nourished in no acute distress; A&Ox3. Resting comfortably at home. Unlabored, regular breathing. Congested cough. Speech is clear and coherent with logical content.  ? ?05/05/2021 CXR reviewed by me: no evidence of acute process; stable, chronic b/l interstitial opacities  ? ?Assessment and Plan: ?Lower respiratory tract  infection ?Recent CXR without acute process. Rash developed with doxycycline so never completed course. Limited improvement with azithromycin. Will treat with augmentin course. Advised to notify if symptoms do not improve and come in for a repeat CXR - verbalized understanding. Supportive care measures for cough and nasal congestion.  ? ?Patient Instructions  ?-Continue Albuterol inhaler 2 puffs every 6 hours as needed for shortness of breath or wheezing. Notify if symptoms persist despite rescue inhaler/neb use. ?-Continue protonix 40 mg daily  ?-Continue fluticasone nasal sprays 2 sprays each nostril daily  ?-Continue loratidine (Claritin) 10 mg daily for allergies  ? ?-Saline nasal irrigations 1-2 times a day  ?-Mucinex DM 600 mg Twice daily  ?-Augmentin 875 Twice daily for 7 days. Notify immediately of any rash, itching, hives, or swelling, or seek emergency care. ?Finish your antibiotics in their entirety. Do not stop just because symptoms improve.  ?Take with food to reduce GI upset. ?-Tessalon Perles (benzonatate) 1 capsule Three times a  day as needed for cough  ?-Astelin nasal spray 2 sprays each nostril Twice daily as needed for nasal congestion  ? ?Follow up in 3-4 weeks with Dr. Vaughan Browner or Alanson Aly. If symptoms do not improve or worsen, pl

## 2021-05-10 NOTE — Assessment & Plan Note (Signed)
Given length of symptoms and limited improvement with z pack, will treat with augmentin course. See above plan. ?

## 2021-05-10 NOTE — Telephone Encounter (Signed)
Patient called in stating he's not feeling well after taking medication Zpack for 5 days. He said Dr Vaughan Browner, told him to call in and report if he's not feeling well. He would like a call back. Marking High priority,because patient wants advice if he should go to ER, or come in for an OV ?

## 2021-05-13 ENCOUNTER — Ambulatory Visit: Payer: Medicare HMO | Admitting: Cardiovascular Disease

## 2021-05-17 DIAGNOSIS — J019 Acute sinusitis, unspecified: Secondary | ICD-10-CM | POA: Diagnosis not present

## 2021-05-17 DIAGNOSIS — G4733 Obstructive sleep apnea (adult) (pediatric): Secondary | ICD-10-CM | POA: Diagnosis not present

## 2021-05-17 DIAGNOSIS — J22 Unspecified acute lower respiratory infection: Secondary | ICD-10-CM | POA: Diagnosis not present

## 2021-05-31 ENCOUNTER — Encounter: Payer: Self-pay | Admitting: Nurse Practitioner

## 2021-05-31 ENCOUNTER — Ambulatory Visit: Payer: Medicare HMO | Admitting: Nurse Practitioner

## 2021-05-31 DIAGNOSIS — G4733 Obstructive sleep apnea (adult) (pediatric): Secondary | ICD-10-CM | POA: Diagnosis not present

## 2021-05-31 DIAGNOSIS — J309 Allergic rhinitis, unspecified: Secondary | ICD-10-CM | POA: Diagnosis not present

## 2021-05-31 DIAGNOSIS — J849 Interstitial pulmonary disease, unspecified: Secondary | ICD-10-CM

## 2021-05-31 DIAGNOSIS — J22 Unspecified acute lower respiratory infection: Secondary | ICD-10-CM

## 2021-05-31 NOTE — Progress Notes (Signed)
? ?'@Patient'$  ID: Alexander Nunez, male    DOB: 09-Jul-1945, 76 y.o.   MRN: [id number] ? ?Chief Complaint  ?Patient presents with  ? Follow-up  ?  Follow up. Patient has no complaints.   ? ? ?Referring provider: ?Redmond [employer], MD ? ?HPI: ?76 year old male, former smoker (22 pack years followed for ILD.  He is a patient Dr. Matilde Bash and last seen via virtual visit on 05/10/2021 by [facility].  Past medical history significant for hypertension, HLD, sleep apnea on CPAP, psoriasis previously treated with methotrexate years ago. ? ?TEST/EVENTS:  ?03/25/2021 CT chest without contrast: Fibrotic interstitial lung disease, with superimposed increased groundglass opacities in the right upper and left lower lobes, most likely due to multilobar pneumonia.  There is increased prominence of mediastinal lymph nodes with examples given above.  Most likely reactive.  There is a 4 cm right thyroid mass, recommended ultrasound for follow-up as last thyroid ultrasound was 06/06/2019. ?05/05/2021 CXR 2 view: No evidence of acute process.  There are stable chronic bilateral interstitial opacities present. ? ?03/25/2021-03/26/2021: ED visit for atypical chest pains and palpitations-no ACS but found to have superimposed pneumonia on CXR and CT.  Discharged on 7-day Levaquin course.  Advised to return if symptoms worsened or did not improve follow-up with pulm outpatient.  Referred to cardiology with heart monitor for evaluation of palpitations. ?  ?04/05/2021: OV with Cobie Leidner NP for hospital follow-up.  Reported feeling better after completing Levaquin.  Still had some mild chest congestion and cough.  Advised mucociliary clearance with Mucinex and flutter valve.  Continued albuterol as needed.  Plans for follow-up CXR in 4 weeks.  He was currently under surveillance for ILD with yearly HRCT's and PFTs.  Most recent PFT had slight decline in FVC and DLCO.  Discussed plans for repeat HRCT at follow-up visit.  Follow-up with cardiology as scheduled. ?   ?05/06/2021: OV with Dr. Vaughan Browner.  Recently seen at urgent care for increased sinus congestion and sinus pressure.  He was provided with doxycycline and Medrol but developed a rash and stopped these after a few days.  CXR in ED did not show any acute process.  Treated at visit for acute sinusitis with Z-Pak and steroid nasal spray.  Elected to continue to monitor ILD given stability of symptoms.  Plans for HRCT in 6 months with plans to repeat PFTs if significant worsening on scan.  Has been on CPAP for approximately 30 years; required replacement so HST was ordered. ? ?05/10/2021: Virtual visit with Hershal Eriksson NP.  Continues to have persistent nasal congestion and now developed new chest congestion with cough.  Cough productive at times with clear to white sputum.  Completed Z-Pak; does not feel like it helped much.  Augmentin course prescribed.  Cough control measures and supportive care advised.  Advised to notify if symptoms do not improve or worsen so we can obtain a chest x-ray.  CXR from a few days ago was without acute process. ? ?05/31/2021: Today-follow-up ?Patient presents today for follow-up after being treated with Augmentin course for lower respiratory infection and ARBS.  He reports feeling significantly better.  Cough has resolved.  Nasal symptoms have also significantly improved.  He does continue to use Flonase nasal spray and Claritin daily.  Was able to stop using the Astelin nasal spray.  Has not required any use of Tessalon Perles recently.  Denies any fevers, hemoptysis, lower extremity swelling, increased shortness of breath, wheezing.  Has not required albuterol inhaler use.  He  continues on CPAP therapy nightly.  He has had his machine for close to 30 years now.  At his previous visit with Dr. Vaughan Browner, HST was ordered for new machine.  He has not heard anything about his home sleep study and is concerned because now his machine has started to cut off on him at night.  He does get good benefit from his  machine.  Does not have an SD card and to pull data from.  Unable to get download due to age of machine. ? ?Allergies  ?Allergen Reactions  ? Cephalexin Other (See Comments)  ?  fever  ? Contrast Media [Iodinated Contrast Media] Hives and Rash  ?  Had persistent rash with only '50mg'$  benadryl prior to spinal injection on 09/03/2017, try 13 hr prep in future.  ? ? ?Immunization History  ?Administered Date(s) Administered  ? Influenza, High Dose Seasonal PF 10/11/2018  ? PFIZER(Purple Top)SARS-COV-2 Vaccination 03/11/2019, 04/01/2019  ? Pneumococcal Polysaccharide-23 10/10/2016  ? Tdap 08/14/2010, 07/25/2018  ? ? ?Past Medical History:  ?Diagnosis Date  ? Anxiety   ? Arthritis   ? back  ? Back pain, chronic   ? Colon polyp   ? Diverticulitis   ? GERD (gastroesophageal reflux disease)   ? Heart palpitations   ? High cholesterol   ? Hypertension   ? Irritable bowel syndrome   ? Osteoporosis   ? Psoriasis   ? Sleep apnea   ? c- pap  ? ? ?Tobacco History: ?Social History  ? ?Tobacco Use  ?Smoking Status Former  ? Packs/day: 2.00  ? Years: 11.00  ? Pack years: 22.00  ? Types: Cigarettes  ? Start date: 05/22/1962  ? Quit date: 05/21/1973  ? Years since quitting: 48.0  ?Smokeless Tobacco Never  ?Tobacco Comments  ? Quit 25 yrs now   ? ?Counseling given: Not Answered ?Tobacco comments: Quit 25 yrs now  ? ? ?Outpatient Medications Prior to Visit  ?Medication Sig Dispense Refill  ? albuterol (VENTOLIN HFA) 108 (90 Base) MCG/ACT inhaler Inhale 1-2 puffs into the lungs every 4 (four) hours as needed for shortness of breath or wheezing. 6.7 g 0  ? ALPRAZolam (XANAX) 0.5 MG tablet Take 0.25-0.5 mg by mouth 3 (three) times daily as needed for sleep.    ? aspirin EC 81 MG tablet Take 81 mg by mouth daily.    ? azelastine (ASTELIN) 0.1 % nasal spray Place 2 sprays into both nostrils 2 (two) times daily as needed for rhinitis. Use in each nostril as directed 30 mL 3  ? azithromycin (ZITHROMAX) 250 MG tablet Take as directed 6 tablet 0  ?  benzonatate (TESSALON) 200 MG capsule Take 1 capsule (200 mg total) by mouth 3 (three) times daily as needed for cough. 30 capsule 1  ? doxazosin (CARDURA) 4 MG tablet Take 4 mg by mouth at bedtime.      ? escitalopram (LEXAPRO) 5 MG tablet Take 5 mg by mouth daily.    ? guaiFENesin (MUCINEX) 600 MG 12 hr tablet Take 1 tablet (600 mg total) by mouth 2 (two) times daily. 60 tablet 2  ? lisinopril (PRINIVIL,ZESTRIL) 10 MG tablet Take 10 mg by mouth every evening.     ? methylPREDNISolone (MEDROL DOSEPAK) 4 MG TBPK tablet TAKE 6 TABLETS ON DAY 1 AS DIRECTED ON PACKAGE AND DECREASE BY 1 TAB EACH DAY FOR A TOTAL OF 6 DAYS    ? metoprolol tartrate (LOPRESSOR) 25 MG tablet Take 0.5 tablets (12.5 mg total) by mouth  2 (two) times daily.    ? pantoprazole (PROTONIX) 40 MG tablet Take 40 mg by mouth daily.    ? pravastatin (PRAVACHOL) 20 MG tablet Take 20 mg by mouth every evening.     ? predniSONE (DELTASONE) 50 MG tablet Take 1 tablet (50 mg total) by mouth daily with breakfast. (Patient not taking: Reported on 05/31/2021) 5 tablet 0  ? ?No facility-administered medications prior to visit.  ? ? ? ?Review of Systems:  ? ?Constitutional: No weight loss or gain, night sweats, fevers, chills, fatigue, or lassitude. ?HEENT: No headaches, difficulty swallowing, tooth/dental problems, or sore throat. No sneezing, itching, ear ache, nasal congestion, or post nasal drip ?CV:  No chest pain, orthopnea, PND, swelling in lower extremities, anasarca, dizziness, palpitations, syncope ?Resp: No shortness of breath with exertion or at rest. No excess mucus or change in color of mucus. No productive or non-productive. No hemoptysis. No wheezing.  No chest wall deformity ?Skin: No rash, lesions, ulcerations ?MSK:  No joint pain or swelling.  No decreased range of motion.  No back pain. ?Neuro: No dizziness or lightheadedness.  ?Psych: No depression or anxiety. Mood stable.  ? ? ? ?Physical Exam: ? ?BP 120/64 (BP Location: Right Arm, Patient  Position: Sitting, Cuff Size: Large)   Pulse 62   Temp 98.3 ?F (36.8 ?C) (Oral)   Ht '5\' 11"'$  (1.803 m)   Wt 277 lb 12.8 oz (126 kg)   SpO2 96%   BMI 38.75 kg/m?  ? ?GEN: Pleasant, interactive, well-appearing; obes

## 2021-05-31 NOTE — Assessment & Plan Note (Signed)
Currently on CPAP therapy with good benefit. Needs new CPAP given age of machine.  Has not heard about HST.  We do not have original sleep study so will need this in order to obtain new machine.  We will follow-up with PCC's today regarding study. ?

## 2021-05-31 NOTE — Patient Instructions (Signed)
-  Continue Albuterol inhaler 2 puffs every 6 hours as needed for shortness of breath or wheezing. Notify if symptoms persist despite rescue inhaler/neb use. ?-Continue protonix 40 mg daily  ?-Continue fluticasone nasal sprays 2 sprays each nostril daily  ?-Continue loratidine (Claritin) 10 mg daily for allergies  ? ?  ?Follow up after home sleep study with Dr. Vaughan Browner or Alanson Aly. Please call to schedule once you have completed your study. If symptoms do not improve or worsen, please contact office for sooner follow up or seek emergency care. ?

## 2021-05-31 NOTE — Assessment & Plan Note (Signed)
Well-controlled.  Continue trigger prevention and intranasal steroid spray. ?

## 2021-05-31 NOTE — Assessment & Plan Note (Addendum)
Breathing stable. Continue with surveillance given clinical stability. Plans for HRCT in 6 months.  ? ?Patient Instructions  ?-Continue Albuterol inhaler 2 puffs every 6 hours as needed for shortness of breath or wheezing. Notify if symptoms persist despite rescue inhaler/neb use. ?-Continue protonix 40 mg daily  ?-Continue fluticasone nasal sprays 2 sprays each nostril daily  ?-Continue loratidine (Claritin) 10 mg daily for allergies  ? ?  ?Follow up after home sleep study with Dr. Vaughan Browner or Alanson Aly. Please call to schedule once you have completed your study. If symptoms do not improve or worsen, please contact office for sooner follow up or seek emergency care. ? ?

## 2021-05-31 NOTE — Assessment & Plan Note (Signed)
Resolved.  Significantly improved since last visit. ?

## 2021-06-05 ENCOUNTER — Encounter: Payer: Self-pay | Admitting: Nurse Practitioner

## 2021-06-12 DIAGNOSIS — H2512 Age-related nuclear cataract, left eye: Secondary | ICD-10-CM | POA: Diagnosis not present

## 2021-06-12 DIAGNOSIS — H25011 Cortical age-related cataract, right eye: Secondary | ICD-10-CM | POA: Diagnosis not present

## 2021-06-12 DIAGNOSIS — H25812 Combined forms of age-related cataract, left eye: Secondary | ICD-10-CM | POA: Diagnosis not present

## 2021-06-12 DIAGNOSIS — H2511 Age-related nuclear cataract, right eye: Secondary | ICD-10-CM | POA: Diagnosis not present

## 2021-06-19 DIAGNOSIS — H25011 Cortical age-related cataract, right eye: Secondary | ICD-10-CM | POA: Diagnosis not present

## 2021-06-19 DIAGNOSIS — H2511 Age-related nuclear cataract, right eye: Secondary | ICD-10-CM | POA: Diagnosis not present

## 2021-07-04 NOTE — Progress Notes (Signed)
CARDIOLOGY CONSULT NOTE       Patient ID: Alexander Nunez MRN: [id number] DOB/AGE: 08/31/1945 76 y.o.  Admit date: (Not on file) Referring Physician: Gerarda Fraction Primary Physician: Redmond School, MD Primary Cardiologist: New Reason for Consultation: Palpitations  Active Problems:   * No active hospital problems. *   HPI:  76 y.o. referred by Dr Gerarda Fraction [phone]  for palpitations. History of anxiety, HLD, HTN, and OSA. During office visit complained of moderate exertional dyspnea and wheezing This was associated with palpitations He is a distant smoker His CXR showed chronic ILD on 05/18/19 F/U CT ordered but not done yet Seen by [facility] 05/18/19 Noted multiple pulmonary issues including OSA, ILD and reactive airway disease former smoker. Cough / congestion worse feels like his wind pipe was getting smaller Tried xanax and allergy medicine without improvement  Connective tissue serologies have been negative   12/27/2018-pulmonary function test-FVC 3.03 (67% predicted), postbronchodilator ratio 87, postbronchodilator FEV1 2.61 (78% predicted), no bronchodilator response, DLCO 22.56 (85% predicted)  Likes to golf and son works for [employer] but has had lumbar fusion and back pain Limits his activity  Retired Solicitor to work with [employer] Went to [place] in ED 03/26/21 for chest pain palpitations. CT showed ILD with multilobar pneumonia ECG SR R/O Rx with Rocephin and Zithromax and then Levaquin  Telemetry with PAC/PVC no significant arrhythmia   TTE 03/26/21 EF 60-65% normal RV no significant valve dx Monitor 04/05/21 no significant arrhythmia HR;s in 40's at night sleeping   Seen by [facility] Marland Kitchen NP 05/31/21 Using CPAP at night  Rx with Augmentin for sinus issues 05/2021 needs new CPAP machine   Frustrated with inability to schedule sleep study and get new CPAP machine   ROS All other systems reviewed and negative except  as noted above  Past Medical History:  Diagnosis Date   Anxiety    Arthritis    back   Back pain, chronic    Colon polyp    Diverticulitis    GERD (gastroesophageal reflux disease)    Heart palpitations    High cholesterol    Hypertension    Irritable bowel syndrome    Osteoporosis    Psoriasis    Pulmonary fibrosis (HCC)    Sleep apnea    c- pap    Family History  Problem Relation Age of Onset   Arrhythmia Mother        Atrial fib   Arrhythmia Sister        Atrial fib   Stroke Sister    Breast cancer Sister    Bladder Cancer Father            Colon cancer Neg Hx    Stomach cancer Neg Hx    Esophageal cancer Neg Hx    Pancreatic cancer Neg Hx    Prostate cancer Neg Hx    Rectal cancer Neg Hx     Social History   Socioeconomic History   Marital status: Married    Spouse name: Not on file   Number of children: 2   Years of education: Not on file   Highest education level: Not on file  Occupational History   Occupation: retired    Comment: Retired  Tobacco Use   Smoking status: Former    Packs/day: 2.00    Years: 11.00    Pack years: 22.00    Types: Cigarettes    Start date: 05/22/1962  Quit date: 05/21/1973    Years since quitting: 48.1   Smokeless tobacco: Never   Tobacco comments:    Quit 25 yrs now   Vaping Use   Vaping Use: Never used  Substance and Sexual Activity   Alcohol use: Yes    Alcohol/week: 14.0 standard drinks    Types: 14 Shots of liquor per week    Comment: social   Drug use: No   Sexual activity: Yes    Partners: Female  Other Topics Concern   Not on file  Social History Narrative   Has dog named Rockydooal   Social Determinants of Health   Financial Resource Strain: Not on file  Food Insecurity: Not on file  Transportation Needs: Not on file  Physical Activity: Not on file  Stress: Not on file  Social Connections: Not on file  Intimate Partner Violence: Not on file    Past Surgical History:  Procedure Laterality  Date   BACK SURGERY  2015   CHOLECYSTECTOMY N/A 10/12/2015   Procedure: LAPAROSCOPIC CHOLECYSTECTOMY;  Surgeon: Aviva Signs, MD;  Location: [facility];  Service: General;  Laterality: N/A;   COLONOSCOPY     FOOT FRACTURE SURGERY     left foot   KNEE ARTHROSCOPY     left   TONSILLECTOMY     UPPER GASTROINTESTINAL ENDOSCOPY        Current Outpatient Medications:    albuterol (VENTOLIN HFA) 108 (90 Base) MCG/ACT inhaler, Inhale 1-2 puffs into the lungs every 4 (four) hours as needed for shortness of breath or wheezing., Disp: 6.7 g, Rfl: 0   ALPRAZolam (XANAX) 0.5 MG tablet, Take 0.25-0.5 mg by mouth 3 (three) times daily as needed for sleep., Disp: , Rfl:    aspirin EC 81 MG tablet, Take 81 mg by mouth daily., Disp: , Rfl:    azelastine (ASTELIN) 0.1 % nasal spray, Place 2 sprays into both nostrils 2 (two) times daily as needed for rhinitis. Use in each nostril as directed, Disp: 30 mL, Rfl: 3   doxazosin (CARDURA) 4 MG tablet, Take 4 mg by mouth at bedtime.  , Disp: , Rfl:    escitalopram (LEXAPRO) 5 MG tablet, Take 5 mg by mouth daily., Disp: , Rfl:    guaiFENesin (MUCINEX) 600 MG 12 hr tablet, Take 1 tablet (600 mg total) by mouth 2 (two) times daily., Disp: 60 tablet, Rfl: 2   lisinopril (PRINIVIL,ZESTRIL) 10 MG tablet, Take 10 mg by mouth every evening. , Disp: , Rfl:    metoprolol tartrate (LOPRESSOR) 25 MG tablet, Take 25 mg by mouth 2 (two) times daily., Disp: , Rfl:    pantoprazole (PROTONIX) 40 MG tablet, Take 40 mg by mouth daily., Disp: , Rfl:    pravastatin (PRAVACHOL) 20 MG tablet, Take 20 mg by mouth every evening. , Disp: , Rfl:     Physical Exam: Blood pressure 140/76, pulse 60, height '5\' 11"'$  (1.803 m), weight 278 lb (126.1 kg), SpO2 96 %.   Affect appropriate Overweight white male  HEENT: normal Neck supple with no adenopathy JVP normal no bruits no thyromegaly Lungs clear with no wheezing and good diaphragmatic motion Heart:  S1/S2 no murmur, no rub, gallop or  click PMI normal Abdomen: benighn, BS positve, no tenderness, no AAA no bruit.  No HSM or HJR Distal pulses intact with no bruits No edema Neuro non-focal Skin warm and dry No muscular weakness Previous lumbar fusion   Labs:   Lab Results  Component Value Date  WBC 8.1 03/26/2021   HGB 12.2 (L) 03/26/2021   HCT 37.3 (L) 03/26/2021   MCV 94.2 03/26/2021   PLT 263 03/26/2021   No results for input(s): NA, K, CL, CO2, BUN, CREATININE, CALCIUM, PROT, BILITOT, ALKPHOS, ALT, AST, GLUCOSE in the last 168 hours.  Invalid input(s): LABALBU Lab Results  Component Value Date   TROPONINI <0.03 07/11/2017    Lab Results  Component Value Date   CHOL 140 03/26/2021   Lab Results  Component Value Date   HDL 43 03/26/2021   Lab Results  Component Value Date   LDLCALC 83 03/26/2021   Lab Results  Component Value Date   TRIG 71 03/26/2021   Lab Results  Component Value Date   CHOLHDL 3.3 03/26/2021   No results found for: LDLDIRECT    Radiology: No results found.  EKG: SR rate 65 ICRBBB low voltage  07/26/19 SR rate 65 normal    ASSESSMENT AND PLAN:   1. Palpitations:  Related to flair of lung disease Already on beta blocker Multiple TTE/Monitor with no MAT/PAF and normal EF with no valve disease will observe Normal myovue 06/04/16  2. Pulmonary:  F/u [facility] with CT scan has ILD, OSA former smoker with allergic flairs and frequent URI/pneumonias  3. HLD:  Continue statin labs with primary  4. HTN:  Continue beta blocker and ACE low sodium DASH diet stable  5. Obesity: discussed low carb diet Both his breathing / back would be much better with weight loss 6. Lumbar: previous L45 fusion with residual L3 disease 6 years ago f/u Dr Carloyn Manner  7. Thyroid:  4 cm right thyroid mass noted on CT 03/25/21 needs f/u US  previous biopsy 2017 negative and [place] 06/06/19 no worrisome new changes    Thyroid US Needs f/u [facility] for sleep study/ new CPAP machine [place] f/u Dr Forde Dandy after  thyroid US for goiter   F/U in a year   Signed: Jenkins Rouge 07/10/2021, 2:14 PM

## 2021-07-10 ENCOUNTER — Encounter: Payer: Self-pay | Admitting: Cardiovascular Disease

## 2021-07-10 ENCOUNTER — Ambulatory Visit: Payer: Medicare HMO | Admitting: Cardiovascular Disease

## 2021-07-10 VITALS — BP 140/76 | HR 60 | Ht 71.0 in | Wt 278.0 lb

## 2021-07-10 DIAGNOSIS — R002 Palpitations: Secondary | ICD-10-CM

## 2021-07-10 DIAGNOSIS — J849 Interstitial pulmonary disease, unspecified: Secondary | ICD-10-CM

## 2021-07-10 DIAGNOSIS — I10 Essential (primary) hypertension: Secondary | ICD-10-CM

## 2021-07-10 DIAGNOSIS — E042 Nontoxic multinodular goiter: Secondary | ICD-10-CM | POA: Diagnosis not present

## 2021-07-10 NOTE — Patient Instructions (Signed)
Medication Instructions:  Your physician recommends that you continue on your current medications as directed. Please refer to the Current Medication list given to you today.  *If you need a refill on your cardiac medications before your next appointment, please call your pharmacy*   Lab Work: NONE   If you have labs (blood work) drawn today and your tests are completely normal, you will receive your results only by: [place] (if you have MyChart) OR A paper copy in the mail If you have any lab test that is abnormal or we need to change your treatment, we will call you to review the results.   Testing/Procedures: US Thyroid    Follow-Up: At [facility], you and your health needs are our priority.  As part of our continuing mission to provide you with exceptional heart care, we have created designated Provider Care Teams.  These Care Teams include your primary Cardiologist (physician) and Advanced Practice Providers (APPs -  Physician Assistants and Nurse Practitioners) who all work together to provide you with the care you need, when you need it.  We recommend signing up for the patient portal called "MyChart".  Sign up information is provided on this After Visit Summary.  MyChart is used to connect with patients for Virtual Visits (Telemedicine).  Patients are able to view lab/test results, encounter notes, upcoming appointments, etc.  Non-urgent messages can be sent to your provider as well.   To learn more about what you can do with MyChart, go to [email or web address].    Your next appointment:   1 year(s)  The format for your next appointment:   In Person  Provider:   Jenkins Rouge, MD    Other Instructions Thank you for choosing [facility]!    Important Information About Sugar

## 2021-07-12 DIAGNOSIS — Z6839 Body mass index (BMI) 39.0-39.9, adult: Secondary | ICD-10-CM | POA: Diagnosis not present

## 2021-07-12 DIAGNOSIS — J329 Chronic sinusitis, unspecified: Secondary | ICD-10-CM | POA: Diagnosis not present

## 2021-07-12 DIAGNOSIS — E6609 Other obesity due to excess calories: Secondary | ICD-10-CM | POA: Diagnosis not present

## 2021-07-12 DIAGNOSIS — I10 Essential (primary) hypertension: Secondary | ICD-10-CM | POA: Diagnosis not present

## 2021-07-12 DIAGNOSIS — J40 Bronchitis, not specified as acute or chronic: Secondary | ICD-10-CM | POA: Diagnosis not present

## 2021-07-12 DIAGNOSIS — J449 Chronic obstructive pulmonary disease, unspecified: Secondary | ICD-10-CM | POA: Diagnosis not present

## 2021-07-12 DIAGNOSIS — J849 Interstitial pulmonary disease, unspecified: Secondary | ICD-10-CM | POA: Diagnosis not present

## 2021-07-12 DIAGNOSIS — J042 Acute laryngotracheitis: Secondary | ICD-10-CM | POA: Diagnosis not present

## 2021-07-17 ENCOUNTER — Ambulatory Visit (HOSPITAL_COMMUNITY)
Admission: RE | Admit: 2021-07-17 | Discharge: 2021-07-17 | Disposition: A | Discharge status: Home / Self Care | Payer: Medicare HMO | Source: Ambulatory Visit | Attending: Internal Medicine | Admitting: Internal Medicine

## 2021-07-17 ENCOUNTER — Other Ambulatory Visit (HOSPITAL_COMMUNITY): Payer: Self-pay | Admitting: Internal Medicine

## 2021-07-17 DIAGNOSIS — R059 Cough, unspecified: Secondary | ICD-10-CM | POA: Insufficient documentation

## 2021-07-17 DIAGNOSIS — J189 Pneumonia, unspecified organism: Secondary | ICD-10-CM | POA: Diagnosis not present

## 2021-07-19 DIAGNOSIS — J849 Interstitial pulmonary disease, unspecified: Secondary | ICD-10-CM | POA: Diagnosis not present

## 2021-07-19 DIAGNOSIS — Z6839 Body mass index (BMI) 39.0-39.9, adult: Secondary | ICD-10-CM | POA: Diagnosis not present

## 2021-07-19 DIAGNOSIS — J9801 Acute bronchospasm: Secondary | ICD-10-CM | POA: Diagnosis not present

## 2021-07-19 DIAGNOSIS — J189 Pneumonia, unspecified organism: Secondary | ICD-10-CM | POA: Diagnosis not present

## 2021-07-19 DIAGNOSIS — E6609 Other obesity due to excess calories: Secondary | ICD-10-CM | POA: Diagnosis not present

## 2021-07-19 DIAGNOSIS — I10 Essential (primary) hypertension: Secondary | ICD-10-CM | POA: Diagnosis not present

## 2021-08-02 ENCOUNTER — Ambulatory Visit: Payer: Medicare HMO

## 2021-08-02 DIAGNOSIS — G4733 Obstructive sleep apnea (adult) (pediatric): Secondary | ICD-10-CM

## 2021-08-05 ENCOUNTER — Other Ambulatory Visit: Payer: Self-pay | Admitting: Nurse Practitioner

## 2021-08-05 DIAGNOSIS — B9689 Other specified bacterial agents as the cause of diseases classified elsewhere: Secondary | ICD-10-CM

## 2021-08-07 DIAGNOSIS — G4733 Obstructive sleep apnea (adult) (pediatric): Secondary | ICD-10-CM | POA: Diagnosis not present

## 2021-08-09 NOTE — Progress Notes (Unsigned)
Synopsis: seen for cough   Subjective:   PATIENT ID: Alexander Nunez GENDER: male DOB: May 30, 1945, MRN: [id number]  No chief complaint on file.  20yM with history of HTN, HLD, OSA on CPAP, psoriasis, AR, 22py smoking - former, followed by Dr. Vaughan Browner for minimally symptomatic ILD of unclear etiology  Otherwise pertinent review of systems is negative.  Past Medical History:  Diagnosis Date   Anxiety    Arthritis    back   Back pain, chronic    Colon polyp    Diverticulitis    GERD (gastroesophageal reflux disease)    Heart palpitations    High cholesterol    Hypertension    Irritable bowel syndrome    Osteoporosis    Psoriasis    Pulmonary fibrosis (HCC)    Sleep apnea    c- pap     Family History  Problem Relation Age of Onset   Arrhythmia Mother        Atrial fib   Arrhythmia Sister        Atrial fib   Stroke Sister    Breast cancer Sister    Bladder Cancer Father            Colon cancer Neg Hx    Stomach cancer Neg Hx    Esophageal cancer Neg Hx    Pancreatic cancer Neg Hx    Prostate cancer Neg Hx    Rectal cancer Neg Hx      Past Surgical History:  Procedure Laterality Date   BACK SURGERY  2015   CHOLECYSTECTOMY N/A 10/12/2015   Procedure: LAPAROSCOPIC CHOLECYSTECTOMY;  Surgeon: Aviva Signs, MD;  Location: [facility];  Service: General;  Laterality: N/A;   COLONOSCOPY     FOOT FRACTURE SURGERY     left foot   KNEE ARTHROSCOPY     left   TONSILLECTOMY     UPPER GASTROINTESTINAL ENDOSCOPY      Social History   Socioeconomic History   Marital status: Married    Spouse name: Not on file   Number of children: 2   Years of education: Not on file   Highest education level: Not on file  Occupational History   Occupation: retired    Comment: Retired  Tobacco Use   Smoking status: Former    Packs/day: 2.00    Years: 11.00    Total pack years: 22.00    Types: Cigarettes    Start date: 05/22/1962    Quit date: 05/21/1973    Years since quitting:  48.2   Smokeless tobacco: Never   Tobacco comments:    Quit 25 yrs now   Vaping Use   Vaping Use: Never used  Substance and Sexual Activity   Alcohol use: Yes    Alcohol/week: 14.0 standard drinks of alcohol    Types: 14 Shots of liquor per week    Comment: social   Drug use: No   Sexual activity: Yes    Partners: Female  Other Topics Concern   Not on file  Social History Narrative   Has dog named Careers information officer   Social Determinants of Health   Financial Resource Strain: Not on file  Food Insecurity: Not on file  Transportation Needs: Not on file  Physical Activity: Not on file  Stress: Not on file  Social Connections: Not on file  Intimate Partner Violence: Not on file     Allergies  Allergen Reactions   Cephalexin Other (See Comments)    fever   Contrast  Media [Iodinated Contrast Media] Hives and Rash    Had persistent rash with only '50mg'$  benadryl prior to spinal injection on 09/03/2017, try 13 hr prep in future.     Outpatient Medications Prior to Visit  Medication Sig Dispense Refill   albuterol (VENTOLIN HFA) 108 (90 Base) MCG/ACT inhaler Inhale 1-2 puffs into the lungs every 4 (four) hours as needed for shortness of breath or wheezing. 6.7 g 0   ALPRAZolam (XANAX) 0.5 MG tablet Take 0.25-0.5 mg by mouth 3 (three) times daily as needed for sleep.     aspirin EC 81 MG tablet Take 81 mg by mouth daily.     Azelastine HCl 137 MCG/SPRAY SOLN PLACE 2 SPRAYS INTO BOTH NOSTRILS 2 TIMES DAILY AS NEEDED FOR RHINITIS. 90 mL 1   doxazosin (CARDURA) 4 MG tablet Take 4 mg by mouth at bedtime.       escitalopram (LEXAPRO) 5 MG tablet Take 5 mg by mouth daily.     guaiFENesin (MUCINEX) 600 MG 12 hr tablet Take 1 tablet (600 mg total) by mouth 2 (two) times daily. 60 tablet 2   lisinopril (PRINIVIL,ZESTRIL) 10 MG tablet Take 10 mg by mouth every evening.      metoprolol tartrate (LOPRESSOR) 25 MG tablet Take 25 mg by mouth 2 (two) times daily.     pantoprazole (PROTONIX) 40 MG tablet  Take 40 mg by mouth daily.     pravastatin (PRAVACHOL) 20 MG tablet Take 20 mg by mouth every evening.      No facility-administered medications prior to visit.       Objective:   Physical Exam:  General appearance: 76 y.o., male, NAD, conversant  Eyes: anicteric sclerae; PERRL, tracking appropriately HENT: NCAT; MMM Neck: Trachea midline; no lymphadenopathy, no JVD Lungs: CTAB, no crackles, no wheeze, with normal respiratory effort CV: RRR, no murmur  Abdomen: Soft, non-tender; non-distended, BS present  Extremities: No peripheral edema, warm Skin: Normal turgor and texture; no rash Psych: Appropriate affect Neuro: Alert and oriented to person and place, no focal deficit     There were no vitals filed for this visit.   on *** LPM *** RA BMI Readings from Last 3 Encounters:  07/10/21 38.77 kg/m  05/31/21 38.75 kg/m  05/06/21 39.47 kg/m   Wt Readings from Last 3 Encounters:  07/10/21 278 lb (126.1 kg)  05/31/21 277 lb 12.8 oz (126 kg)  05/06/21 283 lb (128.4 kg)     CBC    Component Value Date/Time   WBC 8.1 03/26/2021 0456   RBC 3.96 (L) 03/26/2021 0456   HGB 12.2 (L) 03/26/2021 0456   HCT 37.3 (L) 03/26/2021 0456   PLT 263 03/26/2021 0456   MCV 94.2 03/26/2021 0456   MCH 30.8 03/26/2021 0456   MCHC 32.7 03/26/2021 0456   RDW 14.0 03/26/2021 0456   LYMPHSABS 2.9 03/13/2019 1330   MONOABS 1.1 (H) 03/13/2019 1330   EOSABS 0.2 03/13/2019 1330   BASOSABS 0.1 03/13/2019 1330    ***  Chest Imaging: CXR 07/18/21  reviewed by me with multifocal alveolar and interstitial opacities fairly similar to 03/25/21    Pulmonary Functions Testing Results:    Latest Ref Rng & Units 08/29/2020    8:48 AM 10/03/2019    8:47 AM 12/27/2018    1:33 PM  PFT Results  FVC-Pre L 2.65  2.90  3.03   FVC-Predicted Pre % 59  64  67   FVC-Post L 2.74   3.00   FVC-Predicted Post %  61   66   Pre FEV1/FVC % % 89  85  85   Post FEV1/FCV % % 88   87   FEV1-Pre L 2.36  2.47   2.57   FEV1-Predicted Pre % 72  75  77   FEV1-Post L 2.42   2.61   DLCO uncorrected ml/min/mmHg 19.75  21.35  22.56   DLCO UNC% % 75  81  85   DLCO corrected ml/min/mmHg 19.75  21.35    DLCO COR %Predicted % 75  81    DLVA Predicted % 116  120  125   TLC L 4.55   4.33   TLC % Predicted % 62   59   RV % Predicted % 67   47    Mild restriction, ?inspiratory loop flattening  HST 08/03/21 with moderate OSA AHI 28.6, O2 low of 81%, no significant positional difference   Echocardiogram:   TTE 03/26/21:  1. Left ventricular ejection fraction, by estimation, is 60 to 65%. The  left ventricle has normal function. The left ventricle has no regional  wall motion abnormalities. There is mild left ventricular hypertrophy.  Left ventricular diastolic parameters  are consistent with Grade I diastolic dysfunction (impaired relaxation).   2. Right ventricular systolic function is normal. The right ventricular  size is normal.   3. The mitral valve was not well visualized. No evidence of mitral valve  regurgitation.   4. The aortic valve was not well visualized. Aortic valve regurgitation  is not visualized.   \    Assessment & Plan:    Plan:      Maryjane Hurter, MD [facility] 08/09/2021 1:02 PM

## 2021-08-12 ENCOUNTER — Encounter: Payer: Self-pay | Admitting: Student

## 2021-08-12 ENCOUNTER — Ambulatory Visit: Payer: Medicare HMO | Admitting: Student

## 2021-08-12 ENCOUNTER — Telehealth: Payer: Self-pay | Admitting: Student

## 2021-08-12 VITALS — BP 120/68 | HR 64 | Temp 97.7°F | Ht 71.0 in | Wt 275.0 lb

## 2021-08-12 DIAGNOSIS — J310 Chronic rhinitis: Secondary | ICD-10-CM | POA: Diagnosis not present

## 2021-08-12 DIAGNOSIS — G4733 Obstructive sleep apnea (adult) (pediatric): Secondary | ICD-10-CM | POA: Diagnosis not present

## 2021-08-12 DIAGNOSIS — J849 Interstitial pulmonary disease, unspecified: Secondary | ICD-10-CM | POA: Diagnosis not present

## 2021-08-12 DIAGNOSIS — R051 Acute cough: Secondary | ICD-10-CM

## 2021-08-12 MED ORDER — FLUTICASONE PROPIONATE 50 MCG/ACT NA SUSP: 2.0000 | Freq: Every day | NASAL | 11 refills | Status: DC

## 2021-08-12 NOTE — Telephone Encounter (Signed)
Called patient and he informed us that he was unsure of the settings on his cpap machine so I went over to talk to Dr Verlee Monte and he states that its fine that he will just order auto settings and that it was fine. I called patient back to inform him of what Dr Verlee Monte stated. Noting further needed

## 2021-08-12 NOTE — Patient Instructions (Addendum)
-   let us know when you find old CPAP prescription to see what pressure you used in the past and we'll use it to guide new CPAP prescription - try nasal irrigation with neti pot daily, clear nose of crusting and then flonase 2 sprays each nare daily for 2 weeks then 1 spray each nare daily at least until next clinic visit - daily non sedating antihistamine - High resolution CT ordered to be done before next visit - referral made to ENT today - see you in 4 weeks or sooner if need be!

## 2021-08-29 ENCOUNTER — Telehealth: Payer: Self-pay | Admitting: Student

## 2021-08-29 DIAGNOSIS — G4733 Obstructive sleep apnea (adult) (pediatric): Secondary | ICD-10-CM

## 2021-08-29 NOTE — Telephone Encounter (Signed)
Spoke with the pt  He is asking about order for CPAP  Looks like per last phone encounter, auto settings were discussed  Dr Verlee Monte, what are the parameters for his auto CPAP settings so we can get this ordered for him? Please advise, thanks

## 2021-08-30 NOTE — Telephone Encounter (Signed)
Maryjane Hurter, MD to Me  [facility] Triage [facility]      08/30/21  4:06 PM  AutoPAP 5-20, full face mask   CPAP ordered and I spoke with the pt's spouse and notified her that this was done.

## 2021-09-02 DIAGNOSIS — I10 Essential (primary) hypertension: Secondary | ICD-10-CM | POA: Diagnosis not present

## 2021-09-02 DIAGNOSIS — E782 Mixed hyperlipidemia: Secondary | ICD-10-CM | POA: Diagnosis not present

## 2021-09-12 ENCOUNTER — Ambulatory Visit (HOSPITAL_COMMUNITY)
Admission: RE | Admit: 2021-09-12 | Discharge: 2021-09-12 | Disposition: A | Discharge status: Home / Self Care | Payer: Medicare HMO | Source: Ambulatory Visit | Attending: Student | Admitting: Student

## 2021-09-12 ENCOUNTER — Ambulatory Visit (HOSPITAL_COMMUNITY)
Admission: RE | Admit: 2021-09-12 | Discharge: 2021-09-12 | Disposition: A | Discharge status: Home / Self Care | Payer: Medicare HMO | Source: Ambulatory Visit | Attending: Cardiovascular Disease | Admitting: Cardiovascular Disease

## 2021-09-12 DIAGNOSIS — J849 Interstitial pulmonary disease, unspecified: Secondary | ICD-10-CM | POA: Insufficient documentation

## 2021-09-12 DIAGNOSIS — E042 Nontoxic multinodular goiter: Secondary | ICD-10-CM

## 2021-09-12 DIAGNOSIS — I700 Atherosclerosis of aorta: Secondary | ICD-10-CM | POA: Diagnosis not present

## 2021-09-12 DIAGNOSIS — R918 Other nonspecific abnormal finding of lung field: Secondary | ICD-10-CM | POA: Diagnosis not present

## 2021-09-12 DIAGNOSIS — E041 Nontoxic single thyroid nodule: Secondary | ICD-10-CM | POA: Diagnosis not present

## 2021-09-12 DIAGNOSIS — J479 Bronchiectasis, uncomplicated: Secondary | ICD-10-CM | POA: Diagnosis not present

## 2021-09-12 DIAGNOSIS — J84112 Idiopathic pulmonary fibrosis: Secondary | ICD-10-CM | POA: Diagnosis not present

## 2021-09-25 ENCOUNTER — Ambulatory Visit: Payer: Medicare HMO | Admitting: Pulmonary Disease

## 2021-09-25 ENCOUNTER — Encounter: Payer: Self-pay | Admitting: Pulmonary Disease

## 2021-09-25 VITALS — BP 138/72 | HR 53 | Temp 97.9°F | Ht 71.0 in | Wt 279.4 lb

## 2021-09-25 DIAGNOSIS — G4733 Obstructive sleep apnea (adult) (pediatric): Secondary | ICD-10-CM

## 2021-09-25 DIAGNOSIS — J849 Interstitial pulmonary disease, unspecified: Secondary | ICD-10-CM

## 2021-09-25 NOTE — Patient Instructions (Signed)
We have discussed your CT scan which may show slight progression of interstitial lung disease We will schedule PFTs at next available and follow-up in clinic in 2 to 3 months for review and plan for next steps

## 2021-09-25 NOTE — Progress Notes (Signed)
Alexander Nunez    [id number]    January 23, 1946  Primary Care Physician:Fusco, Purcell Nails, MD  Referring Physician: Redmond School, MD [address] [place],  Alexander Nunez [id number]  Chief complaint: Follow-up for ILD, pulmonary fibrosis  HPI: 76 year old with history of hypertension, hyperlipidemia, sleep apnea, psoriasis Had an incidental finding of interstitial lung disease on CT abdomen which was done for diverticulitis He has seen Dr. Velvet Bathe at [place] who did a high resolution CT with findings of alternate diagnosis, possible HP.  He had HP panel on 12/28/2018 which is negative. History notable for psoriasis with skin involvement.  He was on methotrexate for a couple of years and stopped as his skin rash improved.  Last dose of methotrexate was several years ago.  Had COVID-19 infection December 2021, referred for monoclonal antibody  Pets: Has a dog.  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  His hobbies include gardening but no significant exposure to mold,No hot tub, Jacuzzi, no down pillows or comforters.  He uses talcum powder every day.  May have had Freon leak from his HVAC system. Likes to play golf.   Smoking history: 30-pack-year smoker.  Quit in 1980 Travel history: No significant travel history Relevant family history: No significant family history of lung disease.  Interval history: Seen in the clinic in July 2023 for acute cough, rhinosinusitis.  He was taken off lisinopril by his primary care with improvement in symptoms Notes O2 sats in the low 90s at home.  States that breathing and dyspnea on exertion is stable  He has had a new CPAP prescription and is due to get the AutoSet machine soon.  Outpatient Encounter Medications as of 09/25/2021  Medication Sig    albuterol (VENTOLIN HFA) 108 (90 Base) MCG/ACT inhaler Inhale 1-2 puffs into the lungs every 4 (four) hours as needed for shortness of breath or wheezing.   ALPRAZolam (XANAX) 0.5 MG tablet Take 0.25-0.5 mg by mouth 3 (three) times daily as needed for sleep.   aspirin EC 81 MG tablet Take 81 mg by mouth daily.   Azelastine HCl 137 MCG/SPRAY SOLN PLACE 2 SPRAYS INTO BOTH NOSTRILS 2 TIMES DAILY AS NEEDED FOR RHINITIS.   doxazosin (CARDURA) 4 MG tablet Take 4 mg by mouth at bedtime.     escitalopram (LEXAPRO) 5 MG tablet Take 5 mg by mouth daily.   fluticasone (FLONASE) 50 MCG/ACT nasal spray Place 2 sprays into both nostrils daily.   guaiFENesin (MUCINEX) 600 MG 12 hr tablet Take 1 tablet (600 mg total) by mouth 2 (two) times daily.   losartan (COZAAR) 25 MG tablet Take 25 mg by mouth daily.   metoprolol tartrate (LOPRESSOR) 25 MG tablet Take 25 mg by mouth 2 (two) times daily.   pantoprazole (PROTONIX) 40 MG tablet Take 40 mg by mouth daily.   pravastatin (PRAVACHOL) 20 MG tablet Take 20 mg by mouth every evening.    [DISCONTINUED] lisinopril (PRINIVIL,ZESTRIL) 10 MG tablet Take 10 mg by mouth every evening.    No facility-administered encounter medications on file as of 09/25/2021.   Physical Exam: Blood pressure 138/72, pulse (!) 53, temperature 97.9 F (36.6 C), temperature source Oral, height '5\' 11"'$  (1.803 m), weight 279 lb 6.4 oz (126.7 kg), SpO2 98 %. Gen:      No acute  distress HEENT:  EOMI, sclera anicteric Neck:     No masses; no thyromegaly Lungs:    Clear to auscultation bilaterally; normal respiratory effort CV:         Regular rate and rhythm; no murmurs Abd:      + bowel sounds; soft, non-tender; no palpable masses, no distension Ext:    No edema; adequate peripheral perfusion Skin:      Warm and dry; no rash Neuro: alert and oriented x 3 Psych: normal mood and affect   Data Reviewed: Imaging: High-resolution CT 12/24/2018-mild emphysema, traction bronchiectasis, patchy  groundglass and reticulation.  More prominent in the upper lobes with no basal gradient.  Air-trapping present. High-res CT 09/19/2019-stable to minimally changed interstitial lung disease in alternate pattern  CT 03/25/2021-interstitial lung disease with superimposed groundglass opacities in the right upper and left lower lobe.  High-resolution CT 09/12/2021-upper lobe predominant fibrotic interstitial lung disease and alternate pattern. I have reviewed the images personally.  PFTs:. 01/06/2019 FVC 3 [66%], FEV1 2.61 [78%], F/F 87, TLC 4.33 [59%], DLCO 22.56 [85%] Severe restriction with no obstruction.  Diffusion capacity is normal  10/03/2019 FVC 2.90 [64%], FEV1 2.47 [94%], DLCO 21.35 [81%] Moderate restriction  08/29/2020 FVC 2.14 [1%], FEV1 2.42 [74%], TLC 4.55 [62%], DLCO 19.75 [75%] Moderate restriction, mild diffusion defect  Labs: Hypersensitivity panel 12/28/2018- Negative CTD profile 02/10/2019-negative  Sleep: Home sleep study 08/03/2021 Moderate OSA with AHI 28.6, low O2 sat of 81%.  Assessment:  Interstitial lung disease CT scan reviewed with alternate pattern of pulmonary fibrosis with upper lobe predominance.  This could be hypersensitivity pneumonitis though he does not have any significant exposures  Lab work including CTD serologies, hypersensitivity panel is negative.  Has remote exposure to asbestos but CT scan is not typical of asbestosis.  His CT may show slight progression.  We had multiple discussions including today for further work-up in detail with patient, including wait and watch with monitoring, bronchoscope with BAL, transbronchial biopsies or surgical lung biopsy.  He is very reluctant to undergo an invasive procedure and wants to get PFTs to see if there is any worsening before making a decision  Will order PFTs and follow-up in the next 2 to 3 months.  We will also discuss case and review scans at multidisciplinary conference.  OSA He has diagnosis of  OSA 30 years ago and is on CPAP at home.   Home sleep study demonstrates moderate sleep apnea.  He is due to get his AutoSet CPAP delivered this week.  Obesity He has weight gain recently which is suspect is contributing to his restriction and dyspnea Advised weight loss with diet and exercise  Plan/Recommendations: - Start CPAP as ordered - PFTs - Multidisciplinary conference discussion   Marshell Garfinkel MD [facility] 09/25/2021, 9:42 AM  CC: Redmond School, MD

## 2021-10-03 DIAGNOSIS — I10 Essential (primary) hypertension: Secondary | ICD-10-CM | POA: Diagnosis not present

## 2021-10-03 DIAGNOSIS — E782 Mixed hyperlipidemia: Secondary | ICD-10-CM | POA: Diagnosis not present

## 2021-10-15 ENCOUNTER — Ambulatory Visit (INDEPENDENT_AMBULATORY_CARE_PROVIDER_SITE_OTHER): Payer: Medicare HMO | Admitting: Pulmonary Disease

## 2021-10-15 DIAGNOSIS — J849 Interstitial pulmonary disease, unspecified: Secondary | ICD-10-CM | POA: Diagnosis not present

## 2021-10-16 DIAGNOSIS — E042 Nontoxic multinodular goiter: Secondary | ICD-10-CM | POA: Diagnosis not present

## 2021-10-16 DIAGNOSIS — J8410 Pulmonary fibrosis, unspecified: Secondary | ICD-10-CM | POA: Diagnosis not present

## 2021-10-16 DIAGNOSIS — E785 Hyperlipidemia, unspecified: Secondary | ICD-10-CM | POA: Diagnosis not present

## 2021-10-16 DIAGNOSIS — I10 Essential (primary) hypertension: Secondary | ICD-10-CM | POA: Diagnosis not present

## 2021-10-16 DIAGNOSIS — G4733 Obstructive sleep apnea (adult) (pediatric): Secondary | ICD-10-CM | POA: Diagnosis not present

## 2021-10-16 DIAGNOSIS — R69 Illness, unspecified: Secondary | ICD-10-CM | POA: Diagnosis not present

## 2021-10-16 DIAGNOSIS — I700 Atherosclerosis of aorta: Secondary | ICD-10-CM | POA: Diagnosis not present

## 2021-10-16 DIAGNOSIS — K760 Fatty (change of) liver, not elsewhere classified: Secondary | ICD-10-CM | POA: Diagnosis not present

## 2021-10-16 DIAGNOSIS — I2584 Coronary atherosclerosis due to calcified coronary lesion: Secondary | ICD-10-CM | POA: Diagnosis not present

## 2021-10-16 DIAGNOSIS — N411 Chronic prostatitis: Secondary | ICD-10-CM | POA: Diagnosis not present

## 2021-10-16 DIAGNOSIS — R002 Palpitations: Secondary | ICD-10-CM | POA: Diagnosis not present

## 2021-10-16 DIAGNOSIS — E669 Obesity, unspecified: Secondary | ICD-10-CM | POA: Diagnosis not present

## 2021-10-22 DIAGNOSIS — G4733 Obstructive sleep apnea (adult) (pediatric): Secondary | ICD-10-CM | POA: Diagnosis not present

## 2021-10-22 DIAGNOSIS — I10 Essential (primary) hypertension: Secondary | ICD-10-CM | POA: Diagnosis not present

## 2021-10-22 DIAGNOSIS — R69 Illness, unspecified: Secondary | ICD-10-CM | POA: Diagnosis not present

## 2021-10-22 NOTE — Progress Notes (Signed)
   Interstitial Lung Disease Multidisciplinary Conference   Alexander Nunez    MRN [id number]    DOB 02-16-1945  Primary Care Physician:Fusco, Purcell Nails, MD  Referring Physician: Dr. Marshell Garfinkel  Time of Conference: 7.30am- 8.30am Date of conference: 10/15/2021 Location of Conference: -  Virtual  Participating Pulmonary: Dr. Brand Males, MD,  Dr Marshell Garfinkel, MD Pathology:  Radiology: Dr Salvatore Marvel MD  Brief History:  76 year old with history of hypertension, hyperlipidemia, sleep apnea, psoriasis  History is notable for psoriasis with skin involvement.  He was on methotrexate for a couple of years and stopped as his skin rash improved.  Last dose of methotrexate was several years ago. Had COVID-19 infection December 2021  CT scan reviewed with alternate pattern of pulmonary fibrosis with upper lobe predominance.  This could be hypersensitivity pneumonitis though he does not have any significant exposures. Lab work including CTD serologies, hypersensitivity panel is negative.  Has remote exposure to asbestos but CT scan is not typical of asbestosis. His CT may show slight progression. Review scans for progression and pattern of ILD. Make recommendations for biopsy or treatment  Serology:  Hypersensitivity panel 12/28/2018- Negative CTD profile 02/10/2019-negative  MDD discussion of CT scan    - Date or time period of scan:  HRCT: 09/12/2021 HRCT: 10/05/2020 HRCT: 09/19/2019 HRCT: 12/21/2018 CTA   : 04/06/2015  - Features mentioned:  CT findings reviewed from 2017-20 23.  Findings show significant peribronchovascular  with upper lob e involvement, mild air trapping. No honey combing.  There is progressive changes of worsening interstitial lung disease  - What is the final conclusion per 2018 ATS/Fleischner Criteria - Alternate pattern.  PFTs:  08/29/2020 FVC 2.14 [1%], FEV1 2.42 [74%], TLC 4.55 [62%], DLCO 19.75 [75%] Moderate restriction, mild diffusion defect  MDD  Impression/Recs:  CTs show progressive interstitial lung disease and alternate pattern.  Differential diagnoses include chronic HP versus NSIP.    Recommend a biopsy and would favor surgical lung biopsy as transbronchial and BAL may not give a definitive diagnosis. MDC see also recommends empiric treatment with antifibrotic's +/- anti-inflammatory if pathology is suggestive  Time Spent in preparation and discussion:  > 30 min  SIGNATURE   Rashun Grattan MD  Pulmonary & Critical care See Amion for pager  If no response to pager , please call [phone] until 7pm After 7:00 pm call Elink  [phone] 10/22/2021, 12:15 PM

## 2021-10-24 DIAGNOSIS — J449 Chronic obstructive pulmonary disease, unspecified: Secondary | ICD-10-CM | POA: Diagnosis not present

## 2021-10-24 DIAGNOSIS — I10 Essential (primary) hypertension: Secondary | ICD-10-CM | POA: Diagnosis not present

## 2021-10-24 DIAGNOSIS — K219 Gastro-esophageal reflux disease without esophagitis: Secondary | ICD-10-CM | POA: Diagnosis not present

## 2021-10-24 DIAGNOSIS — Z6839 Body mass index (BMI) 39.0-39.9, adult: Secondary | ICD-10-CM | POA: Diagnosis not present

## 2021-10-24 DIAGNOSIS — J849 Interstitial pulmonary disease, unspecified: Secondary | ICD-10-CM | POA: Diagnosis not present

## 2021-10-24 DIAGNOSIS — R1010 Upper abdominal pain, unspecified: Secondary | ICD-10-CM | POA: Diagnosis not present

## 2021-11-05 DIAGNOSIS — J029 Acute pharyngitis, unspecified: Secondary | ICD-10-CM | POA: Diagnosis not present

## 2021-11-05 DIAGNOSIS — H6693 Otitis media, unspecified, bilateral: Secondary | ICD-10-CM | POA: Diagnosis not present

## 2021-11-08 DIAGNOSIS — J441 Chronic obstructive pulmonary disease with (acute) exacerbation: Secondary | ICD-10-CM | POA: Diagnosis not present

## 2021-11-08 DIAGNOSIS — J019 Acute sinusitis, unspecified: Secondary | ICD-10-CM | POA: Diagnosis not present

## 2021-11-08 DIAGNOSIS — J069 Acute upper respiratory infection, unspecified: Secondary | ICD-10-CM | POA: Diagnosis not present

## 2021-11-14 ENCOUNTER — Ambulatory Visit: Payer: Medicare HMO | Admitting: Pulmonary Disease

## 2021-11-14 ENCOUNTER — Encounter: Payer: Self-pay | Admitting: Pulmonary Disease

## 2021-11-14 VITALS — BP 124/62 | HR 51 | Temp 97.8°F | Ht 71.0 in | Wt 281.0 lb

## 2021-11-14 DIAGNOSIS — J849 Interstitial pulmonary disease, unspecified: Secondary | ICD-10-CM

## 2021-11-14 DIAGNOSIS — R051 Acute cough: Secondary | ICD-10-CM | POA: Diagnosis not present

## 2021-11-14 DIAGNOSIS — J22 Unspecified acute lower respiratory infection: Secondary | ICD-10-CM

## 2021-11-14 MED ORDER — ALBUTEROL SULFATE HFA 108 (90 BASE) MCG/ACT IN AERS: 1.0000 | INHALATION_SPRAY | RESPIRATORY_TRACT | 5 refills | Status: AC | PRN

## 2021-11-14 MED ORDER — PREDNISONE 10 MG PO TABS: ORAL_TABLET | ORAL | 0 refills | Status: AC

## 2021-11-14 NOTE — Addendum Note (Signed)
Addended by: Valerie Salts on: 11/14/2021 09:15 AM   Modules accepted: Orders

## 2021-11-14 NOTE — Patient Instructions (Signed)
Lower respiratory tract infection: You have been treated with broad-spectrum antibiotics for a lengthy amount of time so we will hold off on further antibiotics for now Take prednisone 20 mg daily x7 days, then 10 mg daily x7 days Use Mucinex to help loosen the mucus Drink plenty of fluids Use albuterol as needed for chest tightness wheezing or shortness of breath, we will call in a prescription for this I will ask Dr. Vaughan Browner to push back the pulmonary function test by 1 month If you get worse (increasing shortness of breath, worsening mucus production or any other symptom) let us know right away.  At that point would want to see you back in the office and decide the next steps.  Keep your previously arranged follow-up visit with Dr. Vaughan Browner

## 2021-11-14 NOTE — Progress Notes (Signed)
Synopsis: Patient of Dr. Vaughan Browner with non-differentiated ILD discovered incidentally on abdominal CT in 2020.  Has a history of psoriasis previously treated with methotrexate.  Monitoring PFTs, expectant therapy.   Subjective:   PATIENT ID: Alexander Nunez GENDER: male DOB: 1945-07-14, MRN: [id number]   HPI  Chief Complaint  Patient presents with   Acute Visit    Stayed in [place] for 2 weeks. Was around his granddaughter who had strep. Sore throat. Has tried amoxicllin and zpak twice with no response. Productive cough with yellowish phlegm.     Alexander Nunez is here to see me because he went to [place] for two weeks to watch his kids and he developed a sore throat and respiratory symptoms.  His granddaughter had strep throat, so he took amoxicillin-clavulanic acid.  After three days of therapy he was seen by urgent care. THey gave him more augment, he didn't get better so he went back.  Was given a steroid shot, prednisone, and a zpack.  He finished his Zpack, but he still has chest congestion and is still coughing up mucus particularly in the mornigns.  It's yell in color.  He is still wheezing.  No fever, no chills.  He is slightly short of breath.  He has ben checking his oxygen level at home, no lower than 93% with exertion.  No weight loss, appetite is OK.  No night sweats.   He had a COVID swab and strep swab that was negative.     Past Medical History:  Diagnosis Date   Anxiety    Arthritis    back   Back pain, chronic    Colon polyp    Diverticulitis    GERD (gastroesophageal reflux disease)    Heart palpitations    High cholesterol    Hypertension    Irritable bowel syndrome    Osteoporosis    Psoriasis    Pulmonary fibrosis (HCC)    Sleep apnea    c- pap     Family History  Problem Relation Age of Onset   Arrhythmia Mother        Atrial fib   Arrhythmia Sister        Atrial fib   Stroke Sister    Breast cancer Sister    Bladder Cancer Father            Colon cancer  Neg Hx    Stomach cancer Neg Hx    Esophageal cancer Neg Hx    Pancreatic cancer Neg Hx    Prostate cancer Neg Hx    Rectal cancer Neg Hx      Social History   Socioeconomic History   Marital status: Married    Spouse name: Not on file   Number of children: 2   Years of education: Not on file   Highest education level: Not on file  Occupational History   Occupation: retired    Comment: Retired  Tobacco Use   Smoking status: Former    Packs/day: 2.00    Years: 11.00    Total pack years: 22.00    Types: Cigarettes    Start date: 05/22/1962    Quit date: 05/21/1973    Years since quitting: 48.5   Smokeless tobacco: Never   Tobacco comments:    Quit 25 yrs now   Vaping Use   Vaping Use: Never used  Substance and Sexual Activity   Alcohol use: Yes    Alcohol/week: 14.0 standard drinks of alcohol    Types:  36 Shots of liquor per week    Comment: social   Drug use: No   Sexual activity: Yes    Partners: Female  Other Topics Concern   Not on file  Social History Narrative   Has dog named Careers information officer   Social Determinants of Health   Financial Resource Strain: Not on file  Food Insecurity: Not on file  Transportation Needs: Not on file  Physical Activity: Not on file  Stress: Not on file  Social Connections: Not on file  Intimate Partner Violence: Not on file     Allergies  Allergen Reactions   Cephalexin Other (See Comments)    fever   Contrast Media [Iodinated Contrast Media] Hives and Rash    Had persistent rash with only '50mg'$  benadryl prior to spinal injection on 09/03/2017, try 13 hr prep in future.     Outpatient Medications Prior to Visit  Medication Sig Dispense Refill   albuterol (VENTOLIN HFA) 108 (90 Base) MCG/ACT inhaler Inhale 1-2 puffs into the lungs every 4 (four) hours as needed for shortness of breath or wheezing. 6.7 g 0   ALPRAZolam (XANAX) 0.5 MG tablet Take 0.25-0.5 mg by mouth 3 (three) times daily as needed for sleep.     aspirin EC 81 MG  tablet Take 81 mg by mouth daily.     Azelastine HCl 137 MCG/SPRAY SOLN PLACE 2 SPRAYS INTO BOTH NOSTRILS 2 TIMES DAILY AS NEEDED FOR RHINITIS. 90 mL 1   doxazosin (CARDURA) 4 MG tablet Take 4 mg by mouth at bedtime.       escitalopram (LEXAPRO) 5 MG tablet Take 5 mg by mouth daily.     fluticasone (FLONASE) 50 MCG/ACT nasal spray Place 2 sprays into both nostrils daily. 16 g 11   guaiFENesin (MUCINEX) 600 MG 12 hr tablet Take 1 tablet (600 mg total) by mouth 2 (two) times daily. 60 tablet 2   losartan (COZAAR) 25 MG tablet Take 25 mg by mouth daily.     metoprolol tartrate (LOPRESSOR) 25 MG tablet Take 25 mg by mouth 2 (two) times daily.     pantoprazole (PROTONIX) 40 MG tablet Take 40 mg by mouth daily.     pravastatin (PRAVACHOL) 20 MG tablet Take 20 mg by mouth every evening.      No facility-administered medications prior to visit.    Review of Systems  Constitutional:  Positive for malaise/fatigue. Negative for chills, diaphoresis, fever and weight loss.  HENT:  Positive for congestion. Negative for nosebleeds and tinnitus.   Respiratory:  Positive for cough, sputum production and shortness of breath.       Objective:  Physical Exam   Vitals:   11/14/21 0836  BP: 124/62  Pulse: (!) 51  Temp: 97.8 F (36.6 C)  TempSrc: Oral  SpO2: 97%  Weight: 281 lb (127.5 kg)  Height: '5\' 11"'$  (1.803 m)   RA  Gen: well appearing HENT: OP clear, TM's clear, neck supple PULM: Wheezing bilaterally R>L, mostly upper airway, normal percussion CV: RRR, no mgr, trace edema GI: BS+, soft, nontender Derm: no cyanosis or rash Psyche: normal mood and affect   CBC    Component Value Date/Time   WBC 8.1 03/26/2021 0456   RBC 3.96 (L) 03/26/2021 0456   HGB 12.2 (L) 03/26/2021 0456   HCT 37.3 (L) 03/26/2021 0456   PLT 263 03/26/2021 0456   MCV 94.2 03/26/2021 0456   MCH 30.8 03/26/2021 0456   MCHC 32.7 03/26/2021 0456   RDW 14.0 03/26/2021  0456   LYMPHSABS 2.9 03/13/2019 1330    MONOABS 1.1 (H) 03/13/2019 1330   EOSABS 0.2 03/13/2019 1330   BASOSABS 0.1 03/13/2019 1330     Chest imaging: 11/08/2021 CXR 2 view personally reviewed> extensive bilateral interstitial opacities  PFT:  Labs:  Path:  Echo:  Heart Catheterization:       Assessment & Plan:   ILD (interstitial lung disease) ([facility])  Acute cough  Lower respiratory tract infection  Discussion: Alexander Nunez presents with persistent cough and mucus production from an upper respiratory infection.  This was most likely viral.  He has been treated with broad-spectrum antibiotics for a lengthy amount of time so I do not think that further antibiotics are going to be helpful.  The differential diagnosis here includes at best persistent sinus congestion and upper airway cough versus persistent community-acquired pneumonia related inflammatory changes in his lungs versus at worst exacerbation of his underlying ILD.    Plan: Lower respiratory tract infection: You have been treated with broad-spectrum antibiotics for a lengthy amount of time so we will hold off on further antibiotics for now Take prednisone 20 mg daily x7 days, then 10 mg daily x7 days Use Mucinex to help loosen the mucus Drink plenty of fluids Use albuterol as needed for chest tightness wheezing or shortness of breath, we will call in a prescription for this I will ask Dr. Vaughan Browner to push back the pulmonary function test by 1 month If you get worse (increasing shortness of breath, worsening mucus production or any other symptom) let us know right away.  At that point would want to see you back in the office and decide the next steps.  Keep your previously arranged follow-up visit with Dr. Vaughan Browner  Immunizations: Immunization History  Administered Date(s) Administered   Influenza, High Dose Seasonal PF 10/11/2018   PFIZER(Purple Top)SARS-COV-2 Vaccination 03/11/2019, 04/01/2019   Pneumococcal Polysaccharide-23 10/10/2016   Tdap 08/14/2010,  07/25/2018     Current Outpatient Medications:    albuterol (VENTOLIN HFA) 108 (90 Base) MCG/ACT inhaler, Inhale 1-2 puffs into the lungs every 4 (four) hours as needed for shortness of breath or wheezing., Disp: 6.7 g, Rfl: 0   ALPRAZolam (XANAX) 0.5 MG tablet, Take 0.25-0.5 mg by mouth 3 (three) times daily as needed for sleep., Disp: , Rfl:    aspirin EC 81 MG tablet, Take 81 mg by mouth daily., Disp: , Rfl:    Azelastine HCl 137 MCG/SPRAY SOLN, PLACE 2 SPRAYS INTO BOTH NOSTRILS 2 TIMES DAILY AS NEEDED FOR RHINITIS., Disp: 90 mL, Rfl: 1   doxazosin (CARDURA) 4 MG tablet, Take 4 mg by mouth at bedtime.  , Disp: , Rfl:    escitalopram (LEXAPRO) 5 MG tablet, Take 5 mg by mouth daily., Disp: , Rfl:    fluticasone (FLONASE) 50 MCG/ACT nasal spray, Place 2 sprays into both nostrils daily., Disp: 16 g, Rfl: 11   guaiFENesin (MUCINEX) 600 MG 12 hr tablet, Take 1 tablet (600 mg total) by mouth 2 (two) times daily., Disp: 60 tablet, Rfl: 2   losartan (COZAAR) 25 MG tablet, Take 25 mg by mouth daily., Disp: , Rfl:    metoprolol tartrate (LOPRESSOR) 25 MG tablet, Take 25 mg by mouth 2 (two) times daily., Disp: , Rfl:    pantoprazole (PROTONIX) 40 MG tablet, Take 40 mg by mouth daily., Disp: , Rfl:    pravastatin (PRAVACHOL) 20 MG tablet, Take 20 mg by mouth every evening. , Disp: , Rfl:

## 2021-11-20 ENCOUNTER — Ambulatory Visit: Payer: Medicare HMO | Admitting: Pulmonary Disease

## 2021-11-20 ENCOUNTER — Encounter: Payer: Self-pay | Admitting: Pulmonary Disease

## 2021-11-20 VITALS — BP 112/76 | HR 69 | Temp 98.0°F | Ht 71.0 in | Wt 282.4 lb

## 2021-11-20 DIAGNOSIS — J849 Interstitial pulmonary disease, unspecified: Secondary | ICD-10-CM

## 2021-11-20 NOTE — Progress Notes (Signed)
Alexander Nunez    [id number]    January 13, 1946  Primary Care Physician:Fusco, Purcell Nails, MD  Referring Physician: Redmond School, MD [address] [place],  [place] [address]  Chief complaint: Follow-up for ILD, pulmonary fibrosis  HPI: 76 year old with history of hypertension, hyperlipidemia, sleep apnea, psoriasis Had an incidental finding of interstitial lung disease on CT abdomen which was done for diverticulitis He has seen Dr. Velvet Bathe at [facility] who did a high resolution CT with findings of alternate diagnosis, possible HP.  He had HP panel on 12/28/2018 which is negative. History notable for psoriasis with skin involvement.  He was on methotrexate for a couple of years and stopped as his skin rash improved.  Last dose of methotrexate was several years ago.  Had COVID-19 infection December 2021, referred for monoclonal antibody  Pets: Has a dog.  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  His hobbies include gardening but no significant exposure to mold,No hot tub, Jacuzzi, no down pillows or comforters.  He uses talcum powder every day.  May have had Freon leak from his HVAC system. Likes to play golf.   Smoking history: 30-pack-year smoker.  Quit in 1980 Travel history: No significant travel history Relevant family history: No significant family history of lung disease.  Interval history: He was seen in clinic on 10/12 with acute cough, lower respiratory tract infection.  By the time he already finished a Z-Pak.  He was given prednisone with improvement in symptoms.  His upcoming PFTs have been canceled.  He is here for follow-up visit.  Discussed at multidisciplinary conference on 10/15/2021 CTs show progressive interstitial lung disease and alternate pattern.   Differential diagnoses include chronic HP versus NSIP.     Recommend a biopsy and would favor surgical lung biopsy as transbronchial and BAL may not give a definitive diagnosis. MDC see also recommends empiric treatment with antifibrotic's +/- anti-inflammatory if pathology is suggestive.  Outpatient Encounter Medications as of 11/20/2021  Medication Sig   albuterol (VENTOLIN HFA) 108 (90 Base) MCG/ACT inhaler Inhale 1-2 puffs into the lungs every 4 (four) hours as needed for shortness of breath or wheezing.   ALPRAZolam (XANAX) 0.5 MG tablet Take 0.25-0.5 mg by mouth 3 (three) times daily as needed for sleep.   aspirin EC 81 MG tablet Take 81 mg by mouth daily.   Azelastine HCl 137 MCG/SPRAY SOLN PLACE 2 SPRAYS INTO BOTH NOSTRILS 2 TIMES DAILY AS NEEDED FOR RHINITIS.   doxazosin (CARDURA) 4 MG tablet Take 4 mg by mouth at bedtime.     escitalopram (LEXAPRO) 5 MG tablet Take 5 mg by mouth daily.   fluticasone (FLONASE) 50 MCG/ACT nasal spray Place 2 sprays into both nostrils daily.   guaiFENesin (MUCINEX) 600 MG 12 hr tablet Take 1 tablet (600 mg total) by mouth 2 (two) times daily.   losartan (COZAAR) 25 MG tablet Take 25 mg by mouth daily.   metoprolol tartrate (LOPRESSOR) 25 MG tablet Take 25 mg by mouth 2 (two) times daily.   pantoprazole (PROTONIX) 40 MG tablet Take 40 mg by mouth daily.   pravastatin (PRAVACHOL) 20 MG tablet Take 20 mg by mouth every evening.    predniSONE (DELTASONE) 10 MG tablet Take 2 tablets (20 mg total) by mouth daily with breakfast for 7 days, THEN  1 tablet (10 mg total) daily with breakfast for 7 days.   No facility-administered encounter medications on file as of 11/20/2021.   Physical Exam: Blood pressure 112/76, pulse 69, temperature 98 F (36.7 C), temperature source Oral, height '5\' 11"'$  (1.803 m), weight 282 lb 6.4 oz (128.1 kg), SpO2 95 %. Gen:      No acute distress HEENT:  EOMI, sclera anicteric Neck:     No masses; no thyromegaly Lungs:    Clear to  auscultation bilaterally; normal respiratory effort CV:         Regular rate and rhythm; no murmurs Abd:      + bowel sounds; soft, non-tender; no palpable masses, no distension Ext:    No edema; adequate peripheral perfusion Skin:      Warm and dry; no rash Neuro: alert and oriented x 3 Psych: normal mood and affect   Data Reviewed: Imaging: High-resolution CT 12/24/2018-mild emphysema, traction bronchiectasis, patchy groundglass and reticulation.  More prominent in the upper lobes with no basal gradient.  Air-trapping present. High-res CT 09/19/2019-stable to minimally changed interstitial lung disease in alternate pattern  CT 03/25/2021-interstitial lung disease with superimposed groundglass opacities in the right upper and left lower lobe.  High-resolution CT 09/12/2021-upper lobe predominant fibrotic interstitial lung disease and alternate pattern. I have reviewed the images personally.  PFTs: 01/06/2019 FVC 3 [66%], FEV1 2.61 [78%], F/F 87, TLC 4.33 [59%], DLCO 22.56 [85%] Severe restriction with no obstruction.  Diffusion capacity is normal  10/03/2019 FVC 2.90 [64%], FEV1 2.47 [94%], DLCO 21.35 [81%] Moderate restriction  08/29/2020 FVC 2.14 [1%], FEV1 2.42 [74%], TLC 4.55 [62%], DLCO 19.75 [75%] Moderate restriction, mild diffusion defect  Labs: Hypersensitivity panel 12/28/2018- Negative CTD profile 02/10/2019- Negative  Sleep: Home sleep study 08/03/2021 Moderate OSA with AHI 28.6, low O2 sat of 81%.  Assessment:  Interstitial lung disease CT scan reviewed with alternate pattern of pulmonary fibrosis with upper lobe predominance.  This could be hypersensitivity pneumonitis though he does not have any significant exposures  Lab work including CTD serologies, hypersensitivity panel is negative.  Has remote exposure to asbestos but CT scan is not typical of asbestosis.  His CT may show slight progression.  We had multiple discussions including today for further work-up in  detail with patient, including wait and watch with monitoring, bronchoscope with BAL, transbronchial biopsies or surgical lung biopsy.  He is very reluctant to undergo an invasive procedure and wants to get PFTs to see if there is any worsening before making a decision  His PFTs have been canceled today as he is recovering from lower respiratory tract infection Per multidisciplinary conference recommendations for biopsy, treatment with antifibrotic's +/- anti-inflammatory.  He is still reluctant to undergo biopsy and wants a repeat PFT to decide on it.  OSA He has diagnosis of OSA 30 years ago.  On auto CPAP. CPAP download reviewed with good compliance and response with residual AHI of 2.2  Obesity He has weight gain recently which is suspect is contributing to his restriction and dyspnea Advised weight loss with diet and exercise  Plan/Recommendations: - PFTs, return to clinic after PFTs to discuss biopsy.  Marshell Garfinkel MD [facility] 11/20/2021, 3:59 PM  CC: Redmond School, MD

## 2021-11-20 NOTE — Patient Instructions (Addendum)
I am glad you are feeling better after your recent infection We will schedule PFTs at next available and follow-up clinic visit after PFTs to discuss.

## 2021-11-21 DIAGNOSIS — Z961 Presence of intraocular lens: Secondary | ICD-10-CM | POA: Diagnosis not present

## 2021-11-21 DIAGNOSIS — G4733 Obstructive sleep apnea (adult) (pediatric): Secondary | ICD-10-CM | POA: Diagnosis not present

## 2021-11-21 DIAGNOSIS — R69 Illness, unspecified: Secondary | ICD-10-CM | POA: Diagnosis not present

## 2021-11-21 DIAGNOSIS — I10 Essential (primary) hypertension: Secondary | ICD-10-CM | POA: Diagnosis not present

## 2021-11-25 DIAGNOSIS — D649 Anemia, unspecified: Secondary | ICD-10-CM | POA: Diagnosis not present

## 2021-11-25 DIAGNOSIS — Z1331 Encounter for screening for depression: Secondary | ICD-10-CM | POA: Diagnosis not present

## 2021-11-25 DIAGNOSIS — Z6841 Body Mass Index (BMI) 40.0 and over, adult: Secondary | ICD-10-CM | POA: Diagnosis not present

## 2021-11-25 DIAGNOSIS — E559 Vitamin D deficiency, unspecified: Secondary | ICD-10-CM | POA: Diagnosis not present

## 2021-11-25 DIAGNOSIS — E039 Hypothyroidism, unspecified: Secondary | ICD-10-CM | POA: Diagnosis not present

## 2021-11-25 DIAGNOSIS — I10 Essential (primary) hypertension: Secondary | ICD-10-CM | POA: Diagnosis not present

## 2021-11-25 DIAGNOSIS — D518 Other vitamin B12 deficiency anemias: Secondary | ICD-10-CM | POA: Diagnosis not present

## 2021-11-25 DIAGNOSIS — M1991 Primary osteoarthritis, unspecified site: Secondary | ICD-10-CM | POA: Diagnosis not present

## 2021-11-25 DIAGNOSIS — K219 Gastro-esophageal reflux disease without esophagitis: Secondary | ICD-10-CM | POA: Diagnosis not present

## 2021-11-25 DIAGNOSIS — Z0001 Encounter for general adult medical examination with abnormal findings: Secondary | ICD-10-CM | POA: Diagnosis not present

## 2021-11-25 DIAGNOSIS — R7989 Other specified abnormal findings of blood chemistry: Secondary | ICD-10-CM | POA: Diagnosis not present

## 2021-11-25 DIAGNOSIS — J849 Interstitial pulmonary disease, unspecified: Secondary | ICD-10-CM | POA: Diagnosis not present

## 2021-11-25 DIAGNOSIS — J449 Chronic obstructive pulmonary disease, unspecified: Secondary | ICD-10-CM | POA: Diagnosis not present

## 2021-11-25 DIAGNOSIS — Z125 Encounter for screening for malignant neoplasm of prostate: Secondary | ICD-10-CM | POA: Diagnosis not present

## 2021-12-03 DIAGNOSIS — D518 Other vitamin B12 deficiency anemias: Secondary | ICD-10-CM | POA: Diagnosis not present

## 2021-12-03 DIAGNOSIS — D649 Anemia, unspecified: Secondary | ICD-10-CM | POA: Diagnosis not present

## 2021-12-03 DIAGNOSIS — R7309 Other abnormal glucose: Secondary | ICD-10-CM | POA: Diagnosis not present

## 2021-12-22 DIAGNOSIS — R69 Illness, unspecified: Secondary | ICD-10-CM | POA: Diagnosis not present

## 2021-12-22 DIAGNOSIS — G4733 Obstructive sleep apnea (adult) (pediatric): Secondary | ICD-10-CM | POA: Diagnosis not present

## 2021-12-22 DIAGNOSIS — I10 Essential (primary) hypertension: Secondary | ICD-10-CM | POA: Diagnosis not present

## 2022-01-08 ENCOUNTER — Ambulatory Visit: Payer: Medicare HMO | Admitting: Pulmonary Disease

## 2022-01-08 ENCOUNTER — Encounter: Payer: Self-pay | Admitting: Pulmonary Disease

## 2022-01-08 ENCOUNTER — Ambulatory Visit (INDEPENDENT_AMBULATORY_CARE_PROVIDER_SITE_OTHER): Payer: Medicare HMO | Admitting: Pulmonary Disease

## 2022-01-08 VITALS — BP 134/64 | HR 70 | Temp 97.7°F | Ht 70.0 in | Wt 280.0 lb

## 2022-01-08 DIAGNOSIS — J849 Interstitial pulmonary disease, unspecified: Secondary | ICD-10-CM

## 2022-01-08 DIAGNOSIS — Z5181 Encounter for therapeutic drug level monitoring: Secondary | ICD-10-CM

## 2022-01-08 DIAGNOSIS — R0602 Shortness of breath: Secondary | ICD-10-CM

## 2022-01-08 LAB — COMPREHENSIVE METABOLIC PANEL
ALT: 28 U/L (ref 0–53)
AST: 27 U/L (ref 0–37)
Albumin: 4.3 g/dL (ref 3.5–5.2)
Alkaline Phosphatase: 77 U/L (ref 39–117)
BUN: 20 mg/dL (ref 6–23)
CO2: 28 mEq/L (ref 19–32)
Calcium: 9.3 mg/dL (ref 8.4–10.5)
Chloride: 104 mEq/L (ref 96–112)
Creatinine, Ser: 1.33 mg/dL (ref 0.40–1.50)
GFR: 52.22 mL/min — ABNORMAL LOW (ref >60.00)
Glucose, Bld: 116 mg/dL — ABNORMAL HIGH (ref 70–99)
Potassium: 3.8 mEq/L (ref 3.5–5.1)
Sodium: 140 mEq/L (ref 135–145)
Total Bilirubin: 0.6 mg/dL (ref 0.2–1.2)
Total Protein: 7.4 g/dL (ref 6.0–8.3)

## 2022-01-08 LAB — PULMONARY FUNCTION TEST
DL/VA % pred: 100 %
DL/VA: 3.96 ml/min/mmHg/L
DLCO cor % pred: 57 %
DLCO cor: 14.84 ml/min/mmHg
DLCO unc % pred: 57 %
DLCO unc: 14.84 ml/min/mmHg
FEF 25-75 Post: 3.96 L/sec
FEF 25-75 Pre: 3.52 L/sec
FEF2575-%Change-Post: 12 %
FEF2575-%Pred-Post: 170 %
FEF2575-%Pred-Pre: 151 %
FEV1-%Change-Post: 1 %
FEV1-%Pred-Post: 64 %
FEV1-%Pred-Pre: 62 %
FEV1-Post: 2.05 L
FEV1-Pre: 2.01 L
FEV1FVC-%Change-Post: 0 %
FEV1FVC-%Pred-Pre: 124 %
FEV6-%Change-Post: 4 %
FEV6-%Pred-Post: 55 %
FEV6-%Pred-Pre: 52 %
FEV6-Post: 2.28 L
FEV6-Pre: 2.18 L
FEV6FVC-%Change-Post: 1 %
FEV6FVC-%Pred-Post: 106 %
FEV6FVC-%Pred-Pre: 104 %
FVC-%Change-Post: 2 %
FVC-%Pred-Post: 52 %
FVC-%Pred-Pre: 50 %
FVC-Post: 2.29 L
FVC-Pre: 2.23 L
Post FEV1/FVC ratio: 89 %
Post FEV6/FVC ratio: 100 %
Pre FEV1/FVC ratio: 90 %
Pre FEV6/FVC Ratio: 98 %
RV % pred: 52 %
RV: 1.36 L
TLC % pred: 51 %
TLC: 3.7 L

## 2022-01-08 LAB — CBC WITH DIFFERENTIAL/PLATELET
Basophils Absolute: 0.1 10*3/uL (ref 0.0–0.1)
Basophils Relative: 1 % (ref 0.0–3.0)
Eosinophils Absolute: 0.5 10*3/uL (ref 0.0–0.7)
Eosinophils Relative: 4.8 % (ref 0.0–5.0)
HCT: 39.7 % (ref 39.0–52.0)
Hemoglobin: 13.7 g/dL (ref 13.0–17.0)
Lymphocytes Relative: 20 % (ref 12.0–46.0)
Lymphs Abs: 1.9 10*3/uL (ref 0.7–4.0)
MCHC: 34.6 g/dL (ref 30.0–36.0)
MCV: 94.7 fl (ref 78.0–100.0)
Monocytes Absolute: 1.1 10*3/uL — ABNORMAL HIGH (ref 0.1–1.0)
Monocytes Relative: 11.7 % (ref 3.0–12.0)
Neutro Abs: 6.1 10*3/uL (ref 1.4–7.7)
Neutrophils Relative %: 62.5 % (ref 43.0–77.0)
Platelets: 281 10*3/uL (ref 150.0–400.0)
RBC: 4.19 Mil/uL — ABNORMAL LOW (ref 4.22–5.81)
RDW: 14.1 % (ref 11.5–15.5)
WBC: 9.7 10*3/uL (ref 4.0–10.5)

## 2022-01-08 NOTE — Patient Instructions (Addendum)
Will check labs today including comprehensive metabolic panel, CBC and proBNP for dyspnea Start paperwork for Ofev Referral to pulmonary rehab Return to clinic in 2 months

## 2022-01-08 NOTE — Progress Notes (Unsigned)
Alexander Nunez    [id number]    [birth year]  Primary Care Physician:Fusco, Purcell Nails, MD  Referring Physician: Redmond School, [place],  [place] [address]  Chief complaint: Follow-up for ILD, pulmonary fibrosis  HPI: 76 y.o. with history of hypertension, hyperlipidemia, sleep apnea, psoriasis  2020 Had an incidental finding of interstitial lung disease on CT abdomen which was done for diverticulitis He has seen Dr. Velvet Bathe at [facility] who did a high resolution CT with findings of alternate diagnosis, possible HP.  He had HP panel on 12/28/2018 which is negative. History notable for psoriasis with skin involvement.  He was on methotrexate for a couple of years and stopped as his skin rash improved.  Last dose of methotrexate was several years ago.  2021 Had COVID-19 infection December 2021, referred for monoclonal antibody  2023 Discussed at multidisciplinary conference on 10/15/2021 CTs show progressive interstitial lung disease and alternate pattern. Differential diagnoses include chronic HP versus NSIP.  Recommend a biopsy and would favor surgical lung biopsy as transbronchial and BAL may not give a definitive diagnosis. MDC see also recommends empiric treatment with antifibrotic's +/- anti-inflammatory if pathology is suggestive.  Pets: Has a dog.  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  His hobbies include gardening but no significant exposure to mold,No hot tub, Jacuzzi, no down pillows or comforters.  He uses talcum powder every day.  May have had Freon leak from his HVAC system. Likes to play golf.   Smoking history: 30-pack-year smoker.  Quit in 1980 Travel history: No significant travel history Relevant family history: No significant family history  of lung disease.  Interval history: Is here for review of PFTs.  States that he noticed some worsening dyspnea on exertion.  Outpatient Encounter Medications as of 01/08/2022  Medication Sig   albuterol (VENTOLIN HFA) 108 (90 Base) MCG/ACT inhaler Inhale 1-2 puffs into the lungs every 4 (four) hours as needed for shortness of breath or wheezing.   ALPRAZolam (XANAX) 0.5 MG tablet Take 0.25-0.5 mg by mouth 3 (three) times daily as needed for sleep.   aspirin EC 81 MG tablet Take 81 mg by mouth daily.   Azelastine HCl 137 MCG/SPRAY SOLN PLACE 2 SPRAYS INTO BOTH NOSTRILS 2 TIMES DAILY AS NEEDED FOR RHINITIS.   doxazosin (CARDURA) 4 MG tablet Take 4 mg by mouth at bedtime.     escitalopram (LEXAPRO) 5 MG tablet Take 5 mg by mouth daily.   fluticasone (FLONASE) 50 MCG/ACT nasal spray Place 2 sprays into both nostrils daily.   guaiFENesin (MUCINEX) 600 MG 12 hr tablet Take 1 tablet (600 mg total) by mouth 2 (two) times daily.   losartan (COZAAR) 25 MG tablet Take 25 mg by mouth daily.   metoprolol tartrate (LOPRESSOR) 25 MG tablet Take 25 mg by mouth 2 (two) times daily.   pantoprazole (PROTONIX) 40 MG tablet Take 40 mg by mouth daily.   pravastatin (PRAVACHOL) 20 MG tablet Take 20 mg by mouth every evening.    No facility-administered encounter medications on file as of 01/08/2022.   Physical Exam: Blood pressure 134/64, pulse 70, temperature 97.7 F (36.5 C), temperature source Oral, height '5\' 10"'$  (1.778 m), weight 280 lb (127 kg), SpO2 97 %. Gen:      No acute distress HEENT:  EOMI, sclera anicteric Neck:     No masses; no thyromegaly Lungs:    Clear to auscultation bilaterally; normal respiratory effort CV:         Regular rate and rhythm; no murmurs Abd:      + bowel sounds; soft, non-tender; no palpable masses, no distension Ext:    No edema; adequate peripheral perfusion Skin:      Warm and dry; no rash Neuro: alert and oriented x 3 Psych: normal mood and affect   Data  Reviewed: Imaging: High-resolution CT 12/24/2018-mild emphysema, traction bronchiectasis, patchy groundglass and reticulation.  More prominent in the upper lobes with no basal gradient.  Air-trapping present. High-res CT 09/19/2019-stable to minimally changed interstitial lung disease in alternate pattern  CT 03/25/2021-interstitial lung disease with superimposed groundglass opacities in the right upper and left lower lobe.  High-resolution CT 09/12/2021-upper lobe predominant fibrotic interstitial lung disease and alternate pattern. I have reviewed the images personally.  PFTs: 01/06/2019 FVC 3 [66%], FEV1 2.61 [78%], F/F 87, TLC 4.33 [59%], DLCO 22.56 [85%] Severe restriction with no obstruction.  Diffusion capacity is normal  10/03/2019 FVC 2.90 [64%], FEV1 2.47 [94%], DLCO 21.35 [81%] Moderate restriction  08/29/2020 FVC 2.14 [1%], FEV1 2.42 [74%], TLC 4.55 [62%], DLCO 19.75 [75%] Moderate restriction, mild diffusion defect  01/08/2022 FVC 2.29 [52%], FEV1 2.05 [64%], F/F89, TLC 3.70 [51%], DLCO 14.84 [57%]   Labs: Hypersensitivity panel 12/28/2018- Negative CTD profile 02/10/2019- Negative  Sleep: Home sleep study 08/03/2021 Moderate OSA with AHI 28.6, low O2 sat of 81%.  Assessment:  Interstitial lung disease CT scan reviewed with alternate pattern of pulmonary fibrosis with upper lobe predominance.  This could be hypersensitivity pneumonitis though he does not have any significant exposures  Lab work including CTD serologies, hypersensitivity panel is negative.  Has remote exposure to asbestos but CT scan is not typical of asbestosis.  His CT may show slight progression.  We had multiple discussions including today for further work-up in detail with patient, including wait and watch with monitoring, bronchoscope with BAL, transbronchial biopsies or surgical lung biopsy.  He is very reluctant to undergo an invasive procedure. Per multidisciplinary conference recommendations for  biopsy, treatment with antifibrotic's +/- anti-inflammatory.  We reviewed PFTs today which shows progressive worsening.  After long discussion with patient and family we have decided to start treatment with antifibrotic's.  Start paperwork for Ofev.  Once he is stabilized on the new medication we will add an immunosuppressive agent such as CellCept. Referral to pulmonary rehab  OSA He has diagnosis of OSA 30 years ago.  On auto CPAP. CPAP download reviewed with good compliance and response with residual AHI of 2.2  Obesity He has weight gain recently which is suspect is contributing to his restriction and dyspnea Advised weight loss with diet and exercise  Plan/Recommendations: - Check baseline labs for monitoring - Paperwork for [facility] - Pulmonary rehab  Marshell Garfinkel MD [facility] 01/08/2022, 1:06 PM  CC: Redmond School, MD

## 2022-01-08 NOTE — Progress Notes (Signed)
PFT done today. 

## 2022-01-09 LAB — PRO B NATRIURETIC PEPTIDE: NT-Pro BNP: 236 pg/mL (ref 0–486)

## 2022-01-13 ENCOUNTER — Telehealth: Payer: Self-pay

## 2022-01-13 NOTE — Telephone Encounter (Signed)
Received New start paperwork for OFEV. Will update as we work through the benefits process.  

## 2022-01-15 NOTE — Telephone Encounter (Signed)
Submitted a Prior Authorization request to [facility] for OFEV via CoverMyMeds. Will update once we receive a response.  Key: [id number]  Knox Saliva, PharmD, MPH, BCPS, CPP Clinical Pharmacist (Rheumatology and Pulmonology)

## 2022-01-17 ENCOUNTER — Encounter: Payer: Self-pay | Admitting: Pulmonary Disease

## 2022-01-17 ENCOUNTER — Other Ambulatory Visit (HOSPITAL_COMMUNITY): Payer: Self-pay

## 2022-01-17 NOTE — Telephone Encounter (Signed)
Received the following message from patient:   "Hi Dr. Vaughan Browner, I have done some research and found that ivermectin has been successful for some patients with my condition. Would you consider prescribing this for me to try? I know it is an older medication that is easily available and affordable. Let me know what you think. Thank you, Alanda Slim"  Dr. Vaughan Browner, can you please advise? Thanks!

## 2022-01-17 NOTE — Telephone Encounter (Signed)
Received notification from [facility] regarding a prior authorization for Alexander Nunez. Authorization has been APPROVED from 1213/2023 to 02/02/2022. Approval letter sent to scan center.  Per test claim, copay for 30 days supply is [phone]  Patient can fill through [facility] (pulmonary fibrosis team): [phone]  Authorization # [id number]  Will need to submit [employer] patient assistance application for Ofev.  Knox Saliva, PharmD, MPH, BCPS, CPP Clinical Pharmacist (Rheumatology and Pulmonology)

## 2022-01-20 DIAGNOSIS — G4733 Obstructive sleep apnea (adult) (pediatric): Secondary | ICD-10-CM | POA: Diagnosis not present

## 2022-01-20 DIAGNOSIS — I10 Essential (primary) hypertension: Secondary | ICD-10-CM | POA: Diagnosis not present

## 2022-01-20 NOTE — Telephone Encounter (Signed)
Hi Giles,  The data on ivermectin in your condition is very preliminarily and only in animal experiments. It has not been approved by the FDA so I don't believe we should use it for you. I am happy to discuss at return clinic visit.  Dr. Vaughan Browner

## 2022-01-20 NOTE — Telephone Encounter (Signed)
Called patient regarding income document requirement for Ofev PAP application. He will collect and plan to drop by clinic on Wednesday morning. He is aware to ask for pharmacy team. Placed in Awaiting Response folder in pharmacy office.  Knox Saliva, PharmD, MPH, BCPS, CPP Clinical Pharmacist (Rheumatology and Pulmonology)

## 2022-01-21 DIAGNOSIS — I10 Essential (primary) hypertension: Secondary | ICD-10-CM | POA: Diagnosis not present

## 2022-01-21 DIAGNOSIS — R69 Illness, unspecified: Secondary | ICD-10-CM | POA: Diagnosis not present

## 2022-01-21 DIAGNOSIS — G4733 Obstructive sleep apnea (adult) (pediatric): Secondary | ICD-10-CM | POA: Diagnosis not present

## 2022-01-22 ENCOUNTER — Other Ambulatory Visit (HOSPITAL_COMMUNITY): Payer: Self-pay

## 2022-01-22 NOTE — Telephone Encounter (Signed)
Submitted Patient Assistance Application to [employer] for OFEV along with provider portion, PA and income documents. Will update patient when we receive a response.  Fax# [phone] Phone# [phone] 

## 2022-01-28 ENCOUNTER — Telehealth: Payer: Self-pay | Admitting: Pharmacist

## 2022-01-28 DIAGNOSIS — Z5181 Encounter for therapeutic drug level monitoring: Secondary | ICD-10-CM

## 2022-01-28 DIAGNOSIS — J849 Interstitial pulmonary disease, unspecified: Secondary | ICD-10-CM

## 2022-01-28 MED ORDER — OFEV 150 MG PO CAPS: 150.0000 mg | ORAL_CAPSULE | Freq: Two times a day (BID) | ORAL | 0 refills | Status: DC

## 2022-01-28 NOTE — Telephone Encounter (Signed)
Subjective:  Patient called today by [facility] pharmacy team for Ofev new start counseling.   Patient was last seen by Dr. Vaughan Browner on 01/08/2022. Pertinent past medical history includes HTN, HLD, sleep apnea, psoriasis (previously on MTX).  History of CAD: No History of MI: No Current anticoagulant use: No History of HTN: Yes  History of elevated LFTs: No History of diarrhea, nausea, vomiting: No  Objective: Allergies  Allergen Reactions   Cephalexin Other (See Comments)    fever   Contrast Media [Iodinated Contrast Media] Hives and Rash    Had persistent rash with only 101m benadryl prior to spinal injection on 09/03/2017, try 13 hr prep in future.    Outpatient Encounter Medications as of 01/28/2022  Medication Sig   albuterol (VENTOLIN HFA) 108 (90 Base) MCG/ACT inhaler Inhale 1-2 puffs into the lungs every 4 (four) hours as needed for shortness of breath or wheezing.   ALPRAZolam (XANAX) 0.5 MG tablet Take 0.25-0.5 mg by mouth 3 (three) times daily as needed for sleep.   aspirin EC 81 MG tablet Take 81 mg by mouth daily.   Azelastine HCl 137 MCG/SPRAY SOLN PLACE 2 SPRAYS INTO BOTH NOSTRILS 2 TIMES DAILY AS NEEDED FOR RHINITIS.   doxazosin (CARDURA) 4 MG tablet Take 4 mg by mouth at bedtime.     escitalopram (LEXAPRO) 5 MG tablet Take 5 mg by mouth daily.   fluticasone (FLONASE) 50 MCG/ACT nasal spray Place 2 sprays into both nostrils daily.   guaiFENesin (MUCINEX) 600 MG 12 hr tablet Take 1 tablet (600 mg total) by mouth 2 (two) times daily.   losartan (COZAAR) 25 MG tablet Take 25 mg by mouth daily.   metoprolol tartrate (LOPRESSOR) 25 MG tablet Take 25 mg by mouth 2 (two) times daily.   pantoprazole (PROTONIX) 40 MG tablet Take 40 mg by mouth daily.   pravastatin (PRAVACHOL) 20 MG tablet Take 20 mg by mouth every evening.    No facility-administered encounter medications on file as of 01/28/2022.     Immunization History  Administered Date(s) Administered    Influenza, High Dose Seasonal PF 10/11/2018   PFIZER(Purple Top)SARS-COV-2 Vaccination 03/11/2019, 04/01/2019   Pneumococcal Polysaccharide-23 10/10/2016   Tdap 08/14/2010, 07/25/2018      PFT's TLC  Date Value Ref Range Status  01/08/2022 3.70 L Final      CMP     Component Value Date/Time   NA 140 01/08/2022 1405   K 3.8 01/08/2022 1405   CL 104 01/08/2022 1405   CO2 28 01/08/2022 1405   GLUCOSE 116 (H) 01/08/2022 1405   BUN 20 01/08/2022 1405   CREATININE 1.33 01/08/2022 1405   CALCIUM 9.3 01/08/2022 1405   PROT 7.4 01/08/2022 1405   ALBUMIN 4.3 01/08/2022 1405   AST 27 01/08/2022 1405   ALT 28 01/08/2022 1405   ALKPHOS 77 01/08/2022 1405   BILITOT 0.6 01/08/2022 1405   GFRNONAA >60 03/26/2021 0456   GFRAA >60 03/13/2019 1330      CBC    Component Value Date/Time   WBC 9.7 01/08/2022 1405   RBC 4.19 (L) 01/08/2022 1405   HGB 13.7 01/08/2022 1405   HCT 39.7 01/08/2022 1405   PLT 281.0 01/08/2022 1405   MCV 94.7 01/08/2022 1405   MCH 30.8 03/26/2021 0456   MCHC 34.6 01/08/2022 1405   RDW 14.1 01/08/2022 1405   LYMPHSABS 1.9 01/08/2022 1405   MONOABS 1.1 (H) 01/08/2022 1405   EOSABS 0.5 01/08/2022 1405   BASOSABS 0.1 01/08/2022 1405  LFT's    Latest Ref Rng & Units 01/08/2022    2:05 PM 03/26/2021    4:56 AM 03/25/2021    9:37 PM  Hepatic Function  Total Protein 6.0 - 8.3 g/dL 7.4  6.4  7.1   Albumin 3.5 - 5.2 g/dL 4.3  3.3  3.8   AST 0 - 37 U/L _0 ALT 0 - 53 U/L _1 Alk Phosphatase 39 - 117 U/L 77  61  74   Total Bilirubin 0.2 - 1.2 mg/dL 0.6  0.8  0.6   Bilirubin, Direct 0.0 - 0.2 mg/dL   0.1       HRCT (09/13/2021) - Peribronchovascular predominant fibrotic interstitial lung disease with no clear craniocaudal predominance. Findings are favored to be due to fibrotic HP, fibrotic NSIP is an additional consideration. Possible slight progression when compared with October 05, 2020 exam. Findings are suggestive of an  alternative diagnosis (not UIP) per consensus guidelines  Assessment and Plan  Ofev Medication Management Thoroughly counseled patient on the efficacy, mechanism of action, dosing, administration, adverse effects, and monitoring parameters of Ofev. Patient verbalized understanding.  Goals of Therapy: Will not stop or reverse the progression of ILD. It will slow the progression of ILD.  Inhibits tyrosine kinase inhibitors which slow the fibrosis/progression of ILD Significant reduction in the rate of disease progression was observed after treatment (61.1% [before] vs 33.3% [after], P?=?0.008) over 42 weeks.  Dosing: 150 mg (one capsule) by mouth twice daily (approx 12 hours apart). Discussed taking with food approximately 12 hours apart. Discussed that capsule should not be crushed or split.  Adverse Effects: Nausea, vomiting, diarrhea (2 in 3 patients) appetite loss, weight loss - management of diarrhea with loperamide discussed including max use of 48 hours and max of 8 capsules per day. Abdominal pain (up to 1 in 5 patients) Nasopharyngitis (13%), UTI (6%) Risk of thrombosis (3%) and acute MI (2%) Hypertension (5%) Dizziness Fatigue (10%)  Monitoring: Monitor for diarrhea, nausea and vomiting, GI perforation, hepatotoxicity  Monitor LFTs - baseline, monthly for first 6 months, then every 3 months routinely CBC w differential at baseline and every 3 months routinely Patient will need labs completed at [facility] in [place]. Future order for hepatic function panel placed today.  Access: Approval of Ofev through: patient assistance through 02/02/2022 Rx sent to: [facility] for Ofev: [phone] Reviewed importance of completing 5320 renewal application to ensure ongoing access to medication.  Medication Reconciliation A drug regimen assessment was performed, including review of allergies, interactions, disease-state management, dosing and immunization history. Medications were  reviewed with the patient, including name, instructions, indication, goals of therapy, potential side effects, importance of adherence, and safe use.  Anticoagulant use: No  Thank you for involving pharmacy to assist in providing this patient's care.   Knox Saliva, PharmD, MPH, BCPS, CPP Clinical Pharmacist (Rheumatology and Pulmonology)

## 2022-01-28 NOTE — Telephone Encounter (Signed)
Received a fax from  [facility] regarding an approval for [facility] patient assistance from 01/23/2022 to 02/02/2022. Approval letter sent to scan center.  Phone number: [phone]  Patient will need to request renewal application for 7530. Spoke with patient and wife and provided with [employer] contact information to schedule shipment this week. Advised him to request renewal application and to complete and return to clinic.  Income documents placed in pharmacy office for 2024 enrollment  Knox Saliva, PharmD, MPH, BCPS, CPP Clinical Pharmacist (Rheumatology and Pulmonology)

## 2022-02-02 ENCOUNTER — Other Ambulatory Visit: Payer: Self-pay | Admitting: Nurse Practitioner

## 2022-02-02 DIAGNOSIS — B9689 Other specified bacterial agents as the cause of diseases classified elsewhere: Secondary | ICD-10-CM

## 2022-02-07 ENCOUNTER — Telehealth: Payer: Self-pay | Admitting: Pulmonary Disease

## 2022-02-07 MED ORDER — PREDNISONE 20 MG PO TABS: ORAL_TABLET | ORAL | 0 refills | Status: DC

## 2022-02-07 MED ORDER — AZITHROMYCIN 250 MG PO TABS: ORAL_TABLET | ORAL | 0 refills | Status: DC

## 2022-02-07 NOTE — Telephone Encounter (Signed)
PT has head and chest congestion. O2 levels drop when moving. Small cough. Last time Dr. Vaughan Browner sent in for  antibx and Steroid. Pls call to advise if he should be seen or can we call in RX. Can hear labored breathing. TY.  Phar: [facility] [place]

## 2022-02-07 NOTE — Telephone Encounter (Signed)
Please call in prednisone 40 mg a day for 5 days and Z-Pak I would advise him to get COVID tested at home and let us know if it is positive so we can call in treatment for it If symptoms worsen then he may need to seek care in emergency room or urgent care.

## 2022-02-07 NOTE — Telephone Encounter (Signed)
Called and spoke with patient. Dr. Matilde Bash recommendations given.  Understanding stated.  Prednisone and Z Pak sent to requested [facility].  Patient stated he would take at home covid test and call office back if he has positive results.

## 2022-02-07 NOTE — Telephone Encounter (Signed)
Called and spoke with patient.  Patient stated he has started to have chest congestion.  Patient stated he has had some cough with yellow sputum first thing in mornings, but clears through the day.  Patient stated his congestion started earlier this week, but worse worse yesterday.  Patient stated he had increased sob yesterday with exertion.  Patient stated he has been checking his O2 sats regularly.  Patient stated yesterday his sats dropped to 87% on room air, but patient stated his sats went back up in the 90's after he stopped what he was doing and sit down.  Patient stated he used his Albuterol inhaler 3 times yesterday, but has not needed it today.  Patient denies fever or chills. Patient stated last time he had this issue he was given Prednisone and antibiotic.  Patient requested any prescriptions be sent to [facility] [place].   Patient did want Dr. Vaughan Browner to know he started [facility] Wednesday 02/05/22.  Message routed to Dr. Vaughan Browner to advise

## 2022-02-18 DIAGNOSIS — D518 Other vitamin B12 deficiency anemias: Secondary | ICD-10-CM | POA: Diagnosis not present

## 2022-02-19 ENCOUNTER — Encounter: Payer: Self-pay | Admitting: Gastroenterology

## 2022-02-20 ENCOUNTER — Encounter (HOSPITAL_COMMUNITY): Payer: Self-pay

## 2022-02-20 ENCOUNTER — Encounter (HOSPITAL_COMMUNITY)
Admission: RE | Admit: 2022-02-20 | Discharge: 2022-02-20 | Disposition: A | Discharge status: Home / Self Care | Payer: Medicare HMO | Source: Ambulatory Visit | Attending: Pulmonary Disease | Admitting: Pulmonary Disease

## 2022-02-20 VITALS — BP 128/68 | HR 74 | Ht 70.0 in | Wt 279.1 lb

## 2022-02-20 DIAGNOSIS — I10 Essential (primary) hypertension: Secondary | ICD-10-CM | POA: Diagnosis not present

## 2022-02-20 DIAGNOSIS — J849 Interstitial pulmonary disease, unspecified: Secondary | ICD-10-CM | POA: Diagnosis present

## 2022-02-20 DIAGNOSIS — G4733 Obstructive sleep apnea (adult) (pediatric): Secondary | ICD-10-CM | POA: Diagnosis not present

## 2022-02-20 NOTE — Progress Notes (Signed)
Pulmonary Individual Treatment Plan  Patient Details  Name: Alexander Nunez MRN: [id number] Date of Birth: 1945/04/27 Referring Provider:   [place] from 02/20/2022 in [place]  Referring Provider Dr. Vaughan Browner       Initial Encounter Date:  Flowsheet Row PULMONARY REHAB OTHER RESP ORIENTATION from 02/20/2022 in [place]  Date 02/20/22       Visit Diagnosis: Interstitial lung disease ([place])  Patient's Home Medications on Admission:   Current Outpatient Medications:    albuterol (VENTOLIN HFA) 108 (90 Base) MCG/ACT inhaler, Inhale 1-2 puffs into the lungs every 4 (four) hours as needed for shortness of breath or wheezing., Disp: 6.7 g, Rfl: 5   ALPRAZolam (XANAX) 0.5 MG tablet, Take 0.25-0.5 mg by mouth 3 (three) times daily as needed for sleep., Disp: , Rfl:    doxazosin (CARDURA) 4 MG tablet, Take 4 mg by mouth at bedtime.  , Disp: , Rfl:    fluticasone (FLONASE) 50 MCG/ACT nasal spray, Place 2 sprays into both nostrils daily., Disp: 16 g, Rfl: 11   guaiFENesin (MUCINEX) 600 MG 12 hr tablet, Take 1 tablet (600 mg total) by mouth 2 (two) times daily., Disp: 60 tablet, Rfl: 2   losartan (COZAAR) 25 MG tablet, Take 25 mg by mouth daily., Disp: , Rfl:    metoprolol tartrate (LOPRESSOR) 25 MG tablet, Take 25 mg by mouth 2 (two) times daily., Disp: , Rfl:    Nintedanib (OFEV) 150 MG CAPS, Take 1 capsule (150 mg total) by mouth 2 (two) times daily., Disp: 90 capsule, Rfl: 0   pantoprazole (PROTONIX) 40 MG tablet, Take 40 mg by mouth daily., Disp: , Rfl:    pravastatin (PRAVACHOL) 20 MG tablet, Take 20 mg by mouth every evening. , Disp: , Rfl:    Azelastine HCl 137 MCG/SPRAY SOLN, PLACE 2 SPRAYS INTO BOTH NOSTRILS 2 TIMES DAILY AS NEEDED FOR RHINITIS. (Patient not taking: Reported on 02/19/2022), Disp: 90 mL, Rfl: 1   azithromycin (ZITHROMAX) 250 MG tablet, Take as directed (Patient not taking: Reported  on 02/19/2022), Disp: 6 tablet, Rfl: 0   escitalopram (LEXAPRO) 5 MG tablet, Take 5 mg by mouth daily. (Patient not taking: Reported on 02/19/2022), Disp: , Rfl:    predniSONE (DELTASONE) 20 MG tablet, Take '40mg'$  for 5 days (Patient not taking: Reported on 02/19/2022), Disp: 10 tablet, Rfl: 0  Past Medical History: Past Medical History:  Diagnosis Date   Anxiety    Arthritis    back   Back pain, chronic    Colon polyp    Diverticulitis    GERD (gastroesophageal reflux disease)    Heart palpitations    High cholesterol    Hypertension    Irritable bowel syndrome    Osteoporosis    Psoriasis    Pulmonary fibrosis (HCC)    Sleep apnea    c- pap    Tobacco Use: Social History   Tobacco Use  Smoking Status Former   Packs/day: 2.00   Years: 11.00   Total pack years: 22.00   Types: Cigarettes   Start date: 05/22/1962   Quit date: 05/21/1973   Years since quitting: 48.7  Smokeless Tobacco Never  Tobacco Comments   Quit 25 yrs now     Labs: Review Flowsheet       Latest Ref Rng & Units 03/26/2021  Labs for ITP Cardiac and Pulmonary Rehab  Cholestrol 0 - 200 mg/dL 140   LDL (calc) 0 -  99 mg/dL 83   HDL-C >40 mg/dL 43   Trlycerides <150 mg/dL 71     Capillary Blood Glucose: No results found for: "GLUCAP"   Pulmonary Assessment Scores:  Pulmonary Assessment Scores     Row Name 02/20/22 1253         ADL UCSD   ADL Phase Entry     SOB Score total 45     Rest 0     Walk 2     Stairs 3     Bath 1     Dress 2     Shop 2       CAT Score   CAT Score 16       mMRC Score   mMRC Score 1             UCSD: Self-administered rating of dyspnea associated with activities of daily living (ADLs) 6-point scale (0 = "not at all" to 5 = "maximal or unable to do because of breathlessness")  Scoring Scores range from 0 to 120.  Minimally important difference is 5 units  CAT: CAT can identify the health impairment of COPD patients and is better correlated with  disease progression.  CAT has a scoring range of zero to 40. The CAT score is classified into four groups of low (less than 10), medium (10 - 20), high (21-30) and very high (31-40) based on the impact level of disease on health status. A CAT score over 10 suggests significant symptoms.  A worsening CAT score could be explained by an exacerbation, poor medication adherence, poor inhaler technique, or progression of COPD or comorbid conditions.  CAT MCID is 2 points  mMRC: mMRC (Modified Medical Research Council) Dyspnea Scale is used to assess the degree of baseline functional disability in patients of respiratory disease due to dyspnea. No minimal important difference is established. A decrease in score of 1 point or greater is considered a positive change.   Pulmonary Function Assessment:   Exercise Target Goals: Exercise Program Goal: Individual exercise prescription set using results from initial 6 min walk test and THRR while considering  patient's activity barriers and safety.   Exercise Prescription Goal: Initial exercise prescription builds to 30-45 minutes a day of aerobic activity, 2-3 days per week.  Home exercise guidelines will be given to patient during program as part of exercise prescription that the participant will acknowledge.  Activity Barriers & Risk Stratification:  Activity Barriers & Cardiac Risk Stratification - 02/20/22 1313       Activity Barriers & Cardiac Risk Stratification   Activity Barriers Joint Problems    Cardiac Risk Stratification High             6 Minute Walk:  6 Minute Walk     Row Name 02/20/22 1317         6 Minute Walk   Phase Initial     Distance 1350 feet     Walk Time 6 minutes     # of Rest Breaks 0     MPH 2.55     METS 2.41     RPE 13     Perceived Dyspnea  12     VO2 Peak 8.46     Symptoms No     Resting HR 73 bpm     Resting BP 128/68     Resting Oxygen Saturation  96 %     Exercise Oxygen Saturation  during 6  min walk 91 %  Max Ex. HR 116 bpm     Max Ex. BP 160/70     2 Minute Post BP 130/68       Interval HR   1 Minute HR 73     2 Minute HR 73     3 Minute HR 73     4 Minute HR 80     5 Minute HR 116     6 Minute HR 116     2 Minute Post HR 76     Interval Heart Rate? Yes       Interval Oxygen   Interval Oxygen? Yes     Baseline Oxygen Saturation % 96 %     1 Minute Oxygen Saturation % 95 %     1 Minute Liters of Oxygen 0 L     2 Minute Oxygen Saturation % 92 %     2 Minute Liters of Oxygen 0 L     3 Minute Oxygen Saturation % 91 %     3 Minute Liters of Oxygen 0 L     4 Minute Oxygen Saturation % 93 %     4 Minute Liters of Oxygen 0 L     5 Minute Oxygen Saturation % 93 %     5 Minute Liters of Oxygen 0 L     6 Minute Oxygen Saturation % 91 %     6 Minute Liters of Oxygen 0 L     2 Minute Post Oxygen Saturation % 96 %     2 Minute Post Liters of Oxygen 0 L              Oxygen Initial Assessment:  Oxygen Initial Assessment - 02/20/22 1252       Home Oxygen   Home Oxygen Device None    Sleep Oxygen Prescription CPAP    Liters per minute 0    Home Exercise Oxygen Prescription None    Home Resting Oxygen Prescription None    Compliance with Home Oxygen Use Yes      Initial 6 min Walk   Oxygen Used None      Program Oxygen Prescription   Program Oxygen Prescription None      Intervention   Short Term Goals To learn and understand importance of monitoring SPO2 with pulse oximeter and demonstrate accurate use of the pulse oximeter.;To learn and understand importance of maintaining oxygen saturations>88%;To learn and demonstrate proper pursed lip breathing techniques or other breathing techniques.     Long  Term Goals Verbalizes importance of monitoring SPO2 with pulse oximeter and return demonstration;Maintenance of O2 saturations>88%;Exhibits proper breathing techniques, such as pursed lip breathing or other method taught during program session;Compliance with  respiratory medication             Oxygen Re-Evaluation:   Oxygen Discharge (Final Oxygen Re-Evaluation):   Initial Exercise Prescription:  Initial Exercise Prescription - 02/20/22 1300       Date of Initial Exercise RX and Referring Provider   Date 02/20/22    Referring Provider Dr. Vaughan Browner    Expected Discharge Date 06/24/22      Treadmill   MPH 1    Grade 0    Minutes 17      NuStep   Level 1    SPM 60    Minutes 22      Prescription Details   Frequency (times per week) 2    Duration Progress to 30 minutes of continuous aerobic without signs/symptoms of  physical distress      Intensity   THRR 40-80% of Max Heartrate 58-116    Ratings of Perceived Exertion 11-13    Perceived Dyspnea 0-4      Resistance Training   Training Prescription Yes    Weight 4    Reps 10-15             Perform Capillary Blood Glucose checks as needed.  Exercise Prescription Changes:   Exercise Comments:   Exercise Goals and Review:   Exercise Goals     Row Name 02/20/22 1319             Exercise Goals   Increase Physical Activity Yes       Intervention Provide advice, education, support and counseling about physical activity/exercise needs.;Develop an individualized exercise prescription for aerobic and resistive training based on initial evaluation findings, risk stratification, comorbidities and participant's personal goals.       Expected Outcomes Short Term: Attend rehab on a regular basis to increase amount of physical activity.;Long Term: Add in home exercise to make exercise part of routine and to increase amount of physical activity.;Long Term: Exercising regularly at least 3-5 days a week.       Increase Strength and Stamina Yes       Intervention Provide advice, education, support and counseling about physical activity/exercise needs.;Develop an individualized exercise prescription for aerobic and resistive training based on initial evaluation findings,  risk stratification, comorbidities and participant's personal goals.       Expected Outcomes Short Term: Increase workloads from initial exercise prescription for resistance, speed, and METs.;Short Term: Perform resistance training exercises routinely during rehab and add in resistance training at home;Long Term: Improve cardiorespiratory fitness, muscular endurance and strength as measured by increased METs and functional capacity (6MWT)       Able to understand and use rate of perceived exertion (RPE) scale Yes       Intervention Provide education and explanation on how to use RPE scale       Expected Outcomes Short Term: Able to use RPE daily in rehab to express subjective intensity level;Long Term:  Able to use RPE to guide intensity level when exercising independently       Able to understand and use Dyspnea scale Yes       Intervention Provide education and explanation on how to use Dyspnea scale       Expected Outcomes Short Term: Able to use Dyspnea scale daily in rehab to express subjective sense of shortness of breath during exertion;Long Term: Able to use Dyspnea scale to guide intensity level when exercising independently       Knowledge and understanding of Target Heart Rate Range (THRR) Yes       Intervention Provide education and explanation of THRR including how the numbers were predicted and where they are located for reference       Expected Outcomes Short Term: Able to state/look up THRR;Short Term: Able to use daily as guideline for intensity in rehab;Long Term: Able to use THRR to govern intensity when exercising independently       Understanding of Exercise Prescription Yes       Intervention Provide education, explanation, and written materials on patient's individual exercise prescription       Expected Outcomes Short Term: Able to explain program exercise prescription;Long Term: Able to explain home exercise prescription to exercise independently                Exercise  Goals Re-Evaluation :   Discharge Exercise Prescription (Final Exercise Prescription Changes):   Nutrition:  Target Goals: Understanding of nutrition guidelines, daily intake of sodium '1500mg'$ , cholesterol '200mg'$ , calories 30% from fat and 7% or less from saturated fats, daily to have 5 or more servings of fruits and vegetables.  Biometrics:  Pre Biometrics - 02/20/22 1320       Pre Biometrics   Height '5\' 10"'$  (1.778 m)    Weight 279 lb 1.6 oz (126.6 kg)    Waist Circumference 48.5 inches    Hip Circumference 50 inches    Waist to Hip Ratio 0.97 %    BMI (Calculated) 40.05    Triceps Skinfold 20 mm    % Body Fat 37.1 %    Grip Strength 44.3 kg    Flexibility 0 in    Single Leg Stand 6.68 seconds              Nutrition Therapy Plan and Nutrition Goals:  Nutrition Therapy & Goals - 02/20/22 1256       Intervention Plan   Intervention Nutrition handout(s) given to patient.    Expected Outcomes Short Term Goal: Understand basic principles of dietary content, such as calories, fat, sodium, cholesterol and nutrients.             Nutrition Assessments:  Nutrition Assessments - 02/20/22 1256       MEDFICTS Scores   Pre Score 50            MEDIFICTS Score Key: ?70 Need to make dietary changes  40-70 Heart Healthy Diet ? 40 Therapeutic Level Cholesterol Diet   Picture Your Plate Scores: <56 Unhealthy dietary pattern with much room for improvement. 41-50 Dietary pattern unlikely to meet recommendations for good health and room for improvement. 51-60 More healthful dietary pattern, with some room for improvement.  >60 Healthy dietary pattern, although there may be some specific behaviors that could be improved.    Nutrition Goals Re-Evaluation:   Nutrition Goals Discharge (Final Nutrition Goals Re-Evaluation):   Psychosocial: Target Goals: Acknowledge presence or absence of significant depression and/or stress, maximize coping skills, provide positive  support system. Participant is able to verbalize types and ability to use techniques and skills needed for reducing stress and depression.  Initial Review & Psychosocial Screening:  Initial Psych Review & Screening - 02/20/22 1314       Initial Review   Current issues with Current Stress Concerns;Current Sleep Concerns;Current Anxiety/Panic    Source of Stress Concerns Chronic Illness    Comments His ILD is progressing, so this does stress him out and cause some anxiety      Family Dynamics   Good Support System? Yes    Comments His support system includes his wife and his two sons.      Barriers   Psychosocial barriers to participate in program There are no identifiable barriers or psychosocial needs.      Screening Interventions   Interventions Encouraged to exercise    Expected Outcomes Short Term goal: Identification and review with participant of any Quality of Life or Depression concerns found by scoring the questionnaire.;Long Term goal: The participant improves quality of Life and PHQ9 Scores as seen by post scores and/or verbalization of changes;Long Term Goal: Stressors or current issues are controlled or eliminated.             Quality of Life Scores:  Quality of Life - 02/20/22 1320       Quality of Life  Select Quality of Life      Quality of Life Scores   Health/Function Pre 20.16 %    Socioeconomic Pre 23.57 %    Psych/Spiritual Pre 22.43 %    Family Pre 28.8 %    GLOBAL Pre 22.53 %            Scores of 19 and below usually indicate a poorer quality of life in these areas.  A difference of  2-3 points is a clinically meaningful difference.  A difference of 2-3 points in the total score of the Quality of Life Index has been associated with significant improvement in overall quality of life, self-image, physical symptoms, and general health in studies assessing change in quality of life.   PHQ-9: Review Flowsheet       02/20/2022  Depression screen  PHQ 2/9  Decreased Interest 0  Down, Depressed, Hopeless 0  PHQ - 2 Score 0  Altered sleeping 0  Tired, decreased energy 1  Change in appetite 0  Feeling bad or failure about yourself  0  Trouble concentrating 0  Moving slowly or fidgety/restless 0  Suicidal thoughts 0  PHQ-9 Score 1  Difficult doing work/chores Not difficult at all   Interpretation of Total Score  Total Score Depression Severity:  1-4 = Minimal depression, 5-9 = Mild depression, 10-14 = Moderate depression, 15-19 = Moderately severe depression, 20-27 = Severe depression   Psychosocial Evaluation and Intervention:  Psychosocial Evaluation - 02/20/22 1339       Psychosocial Evaluation & Interventions   Interventions Encouraged to exercise with the program and follow exercise prescription    Comments Pt has no barriers to participate in PR. He does anxiety, sleep, and stress, but all of which are well controlled. He has no other identifiable psychosocial issues. He takes xanax for his sleep. He is prescribed 0.5 mg tid, but he reports that he only takes half of a tablet a few nights per week. His anxiety and stress stem from his ILD. He was told at his last visit with Dr. Vaughan Browner that his ILD is progressing, so this does cause him stress and anxiety. Dr. Gerarda Fraction, his PCP, prescribed him Lexapro for the anxiety, but he reports that he is not taking it. He has been taking ofev, an antifibrotic medication, for his ILD, and this is giving him hope for his future. He is also mentions how lucky he is to not be experiencing any side effects from ofev. He scored a 1 on his PHQ-9, and this is due to his lack of energy. He reports that he has a good support system from his wife and two sons. His goals while in the program are to decrease his SOB with exertion and to lose weight. He is eager to start the program.    Expected Outcomes Pt's stress, anxiety, and sleep will continue to be managed, and he will have no other identifiable  psychosocial issues.    Continue Psychosocial Services  No Follow up required             Psychosocial Re-Evaluation:   Psychosocial Discharge (Final Psychosocial Re-Evaluation):    Education: Education Goals: Education classes will be provided on a weekly basis, covering required topics. Participant will state understanding/return demonstration of topics presented.  Learning Barriers/Preferences:  Learning Barriers/Preferences - 02/20/22 1319       Learning Barriers/Preferences   Learning Barriers None    Learning Preferences Written Material  Education Topics: How Lungs Work and Diseases: - Discuss the anatomy of the lungs and diseases that can affect the lungs, such as COPD.   Exercise: -Discuss the importance of exercise, FITT principles of exercise, normal and abnormal responses to exercise, and how to exercise safely.   Environmental Irritants: -Discuss types of environmental irritants and how to limit exposure to environmental irritants.   Meds/Inhalers and oxygen: - Discuss respiratory medications, definition of an inhaler and oxygen, and the proper way to use an inhaler and oxygen.   Energy Saving Techniques: - Discuss methods to conserve energy and decrease shortness of breath when performing activities of daily living.    Bronchial Hygiene / Breathing Techniques: - Discuss breathing mechanics, pursed-lip breathing technique,  proper posture, effective ways to clear airways, and other functional breathing techniques   Cleaning Equipment: - Provides group verbal and written instruction about the health risks of elevated stress, cause of high stress, and healthy ways to reduce stress.   Nutrition I: Fats: - Discuss the types of cholesterol, what cholesterol does to the body, and how cholesterol levels can be controlled.   Nutrition II: Labels: -Discuss the different components of food labels and how to read food  labels.   Respiratory Infections: - Discuss the signs and symptoms of respiratory infections, ways to prevent respiratory infections, and the importance of seeking medical treatment when having a respiratory infection.   Stress I: Signs and Symptoms: - Discuss the causes of stress, how stress may lead to anxiety and depression, and ways to limit stress.   Stress II: Relaxation: -Discuss relaxation techniques to limit stress.   Oxygen for Home/Travel: - Discuss how to prepare for travel when on oxygen and proper ways to transport and store oxygen to ensure safety.   Knowledge Questionnaire Score:  Knowledge Questionnaire Score - 02/20/22 1256       Knowledge Questionnaire Score   Pre Score 17/18             Core Components/Risk Factors/Patient Goals at Admission:  Personal Goals and Risk Factors at Admission - 02/20/22 1322       Core Components/Risk Factors/Patient Goals on Admission    Weight Management Yes;Obesity;Weight Loss    Intervention Obesity: Provide education and appropriate resources to help participant work on and attain dietary goals.;Weight Management/Obesity: Establish reasonable short term and long term weight goals.;Weight Management: Provide education and appropriate resources to help participant work on and attain dietary goals.;Weight Management: Develop a combined nutrition and exercise program designed to reach desired caloric intake, while maintaining appropriate intake of nutrient and fiber, sodium and fats, and appropriate energy expenditure required for the weight goal.    Expected Outcomes Short Term: Continue to assess and modify interventions until short term weight is achieved;Long Term: Adherence to nutrition and physical activity/exercise program aimed toward attainment of established weight goal;Weight Maintenance: Understanding of the daily nutrition guidelines, which includes 25-35% calories from fat, 7% or less cal from saturated fats, less  than '200mg'$  cholesterol, less than 1.5gm of sodium, & 5 or more servings of fruits and vegetables daily;Weight Loss: Understanding of general recommendations for a balanced deficit meal plan, which promotes 1-2 lb weight loss per week and includes a negative energy balance of [phone] kcal/d;Understanding recommendations for meals to include 15-35% energy as protein, 25-35% energy from fat, 35-60% energy from carbohydrates, less than '200mg'$  of dietary cholesterol, 20-35 gm of total fiber daily;Understanding of distribution of calorie intake throughout the day with the consumption of 4-5 meals/snacks  Improve shortness of breath with ADL's Yes    Intervention Provide education, individualized exercise plan and daily activity instruction to help decrease symptoms of SOB with activities of daily living.    Expected Outcomes Short Term: Improve cardiorespiratory fitness to achieve a reduction of symptoms when performing ADLs;Long Term: Be able to perform more ADLs without symptoms or delay the onset of symptoms             Core Components/Risk Factors/Patient Goals Review:    Core Components/Risk Factors/Patient Goals at Discharge (Final Review):    ITP Comments:   Comments: Patient arrived for 1st visit/orientation/education at 1230. Patient was referred to PR by Dr. Vaughan Browner due to ILD (J84.9). During orientation advised patient on arrival and appointment times what to wear, what to do before, during and after exercise. Reviewed attendance and class policy.  Pt is scheduled to return Pulmonary Rehab on 02/25/22 at 1045. Pt was advised to come to class 15 minutes before class starts.  Discussed RPE/Dpysnea scales. Patient participated in warm up stretches. Patient was able to complete 6 minute walk test. Patient was measured for the equipment. Discussed equipment safety with patient. Took patient pre-anthropometric measurements. Patient finished visit at 1430.

## 2022-02-21 DIAGNOSIS — I10 Essential (primary) hypertension: Secondary | ICD-10-CM | POA: Diagnosis not present

## 2022-02-21 DIAGNOSIS — R69 Illness, unspecified: Secondary | ICD-10-CM | POA: Diagnosis not present

## 2022-02-21 DIAGNOSIS — G4733 Obstructive sleep apnea (adult) (pediatric): Secondary | ICD-10-CM | POA: Diagnosis not present

## 2022-02-25 ENCOUNTER — Encounter (HOSPITAL_COMMUNITY)
Admission: RE | Admit: 2022-02-25 | Discharge: 2022-02-25 | Disposition: A | Discharge status: Home / Self Care | Payer: Medicare HMO | Source: Ambulatory Visit | Attending: Pulmonary Disease | Admitting: Pulmonary Disease

## 2022-02-25 DIAGNOSIS — J849 Interstitial pulmonary disease, unspecified: Secondary | ICD-10-CM

## 2022-02-25 NOTE — Progress Notes (Signed)
Daily Session Note  Patient Details  Name: Alexander Nunez MRN: [id number] Date of Birth: June 01, 1945 Referring Provider:   Flowsheet Row PULMONARY REHAB OTHER RESP ORIENTATION from 02/20/2022 in [place]  Referring Provider Dr. Vaughan Browner       Encounter Date: 02/25/2022  Check In:  Session Check In - 02/25/22 1037       Check-In   Supervising physician immediately available to respond to emergencies [facility] MD immediately available    Physician(s) Dr. Domenic Polite    Location [facility]    Staff Present Leana Roe, BS, Exercise Physiologist;Dalton Sherrie George, MS, ACSM-CEP;Madelyn Flavors, RN, BSN    Virtual Visit No    Medication changes reported     No    Fall or balance concerns reported    No    Tobacco Cessation No Change    Warm-up and Cool-down Performed as group-led instruction    Resistance Training Performed Yes    VAD Patient? No    PAD/SET Patient? No      Pain Assessment   Currently in Pain? No/denies    Multiple Pain Sites No             Capillary Blood Glucose: No results found for this or any previous visit (from the past 24 hour(s)).    Social History   Tobacco Use  Smoking Status Former   Packs/day: 2.00   Years: 11.00   Total pack years: 22.00   Types: Cigarettes   Start date: 05/22/1962   Quit date: 05/21/1973   Years since quitting: 48.8  Smokeless Tobacco Never  Tobacco Comments   Quit 25 yrs now     Goals Met:  Proper associated with RPD/PD & O2 Sat Independence with exercise equipment Using PLB without cueing & demonstrates good technique Exercise tolerated well Queuing for purse lip breathing No report of concerns or symptoms today Strength training completed today  Goals Unmet:  Not Applicable  Comments: Checkout at 1145.   Dr. Kathie Dike is Medical Director for [facility].

## 2022-02-27 ENCOUNTER — Encounter (HOSPITAL_COMMUNITY)
Admission: RE | Admit: 2022-02-27 | Discharge: 2022-02-27 | Disposition: A | Discharge status: Home / Self Care | Payer: Medicare HMO | Source: Ambulatory Visit | Attending: Pulmonary Disease | Admitting: Pulmonary Disease

## 2022-02-27 DIAGNOSIS — J849 Interstitial pulmonary disease, unspecified: Secondary | ICD-10-CM

## 2022-02-27 NOTE — Progress Notes (Signed)
Daily Session Note  Patient Details  Name: Alexander Nunez MRN: [id number] Date of Birth: 1945/08/28 Referring Provider:   Flowsheet Row PULMONARY REHAB OTHER RESP ORIENTATION from 02/20/2022 in [place]  Referring Provider Dr. Vaughan Browner       Encounter Date: 02/27/2022  Check In:  Session Check In - 02/27/22 1045       Check-In   Supervising physician immediately available to respond to emergencies [facility] MD immediately available    Physician(s) Dr. Domenic Polite    Location [facility]    Staff Present Leana Roe, BS, Exercise Physiologist;Chett Taniguchi BSN, RN    Virtual Visit No    Medication changes reported     No    Fall or balance concerns reported    No    Tobacco Cessation No Change    Warm-up and Cool-down Performed as group-led instruction    Resistance Training Performed Yes    VAD Patient? No    PAD/SET Patient? No      Pain Assessment   Currently in Pain? Yes    Pain Score 6     Pain Location Hip    Pain Orientation Left    Pain Descriptors / Indicators Sharp;Throbbing    Pain Type Acute pain    Pain Frequency Constant    Multiple Pain Sites No             Capillary Blood Glucose: No results found for this or any previous visit (from the past 24 hour(s)).    Social History   Tobacco Use  Smoking Status Former   Packs/day: 2.00   Years: 11.00   Total pack years: 22.00   Types: Cigarettes   Start date: 05/22/1962   Quit date: 05/21/1973   Years since quitting: 48.8  Smokeless Tobacco Never  Tobacco Comments   Quit 25 yrs now     Goals Met:  Proper associated with RPD/PD & O2 Sat Independence with exercise equipment Using PLB without cueing & demonstrates good technique Exercise tolerated well Queuing for purse lip breathing No report of concerns or symptoms today Strength training completed today  Goals Unmet:  Not Applicable  Comments: check out at 11:45   Dr. Kathie Dike is Medical  Director for [facility].

## 2022-03-04 ENCOUNTER — Encounter (HOSPITAL_COMMUNITY)
Admission: RE | Admit: 2022-03-04 | Discharge: 2022-03-04 | Disposition: A | Discharge status: Home / Self Care | Payer: Medicare HMO | Source: Ambulatory Visit | Attending: Pulmonary Disease | Admitting: Pulmonary Disease

## 2022-03-04 VITALS — Wt 278.7 lb

## 2022-03-04 DIAGNOSIS — J849 Interstitial pulmonary disease, unspecified: Secondary | ICD-10-CM

## 2022-03-04 NOTE — Progress Notes (Signed)
Daily Session Note  Patient Details  Name: Alexander Nunez MRN: [id number] Date of Birth: 02/23/1945 Referring Provider:   Flowsheet Row PULMONARY REHAB OTHER RESP ORIENTATION from 02/20/2022 in [place]  Referring Provider Dr. Vaughan Browner       Encounter Date: 03/04/2022  Check In:  Session Check In - 03/04/22 1044       Check-In   Supervising physician immediately available to respond to emergencies [facility] MD immediately available    Physician(s) Dr Dellia Cloud    Location [facility]    Staff Present Leana Roe, BS, Exercise Physiologist;Brissa Asante Hassell Done, RN, BSN    Virtual Visit No    Medication changes reported     No    Fall or balance concerns reported    No    Tobacco Cessation No Change    Warm-up and Cool-down Performed as group-led instruction    Resistance Training Performed Yes    VAD Patient? No    PAD/SET Patient? No      Pain Assessment   Currently in Pain? No/denies    Pain Score 0-No pain    Multiple Pain Sites No             Capillary Blood Glucose: No results found for this or any previous visit (from the past 24 hour(s)).    Social History   Tobacco Use  Smoking Status Former   Packs/day: 2.00   Years: 11.00   Total pack years: 22.00   Types: Cigarettes   Start date: 05/22/1962   Quit date: 05/21/1973   Years since quitting: 48.8  Smokeless Tobacco Never  Tobacco Comments   Quit 25 yrs now     Goals Met:  Proper associated with RPD/PD & O2 Sat Independence with exercise equipment Using PLB without cueing & demonstrates good technique Exercise tolerated well Queuing for purse lip breathing No report of concerns or symptoms today Strength training completed today  Goals Unmet:  Not Applicable  Comments: checkout at 1145.   Dr. Kathie Dike is Medical Director for [facility].

## 2022-03-05 ENCOUNTER — Encounter (HOSPITAL_COMMUNITY): Payer: Medicare HMO

## 2022-03-05 NOTE — Progress Notes (Signed)
Pulmonary Individual Treatment Plan  Patient Details  Name: Alexander Nunez MRN: [id number] Date of Birth: 1945/03/20 Referring Provider:   Flowsheet Row PULMONARY REHAB OTHER RESP ORIENTATION from 02/20/2022 in [place]  Referring Provider Dr. Vaughan Browner       Initial Encounter Date:  Flowsheet Row PULMONARY REHAB OTHER RESP ORIENTATION from 02/20/2022 in [place]  Date 02/20/22       Visit Diagnosis: Interstitial lung disease ([place])  Patient's Home Medications on Admission:   Current Outpatient Medications:    albuterol (VENTOLIN HFA) 108 (90 Base) MCG/ACT inhaler, Inhale 1-2 puffs into the lungs every 4 (four) hours as needed for shortness of breath or wheezing., Disp: 6.7 g, Rfl: 5   ALPRAZolam (XANAX) 0.5 MG tablet, Take 0.25-0.5 mg by mouth 3 (three) times daily as needed for sleep., Disp: , Rfl:    Azelastine HCl 137 MCG/SPRAY SOLN, PLACE 2 SPRAYS INTO BOTH NOSTRILS 2 TIMES DAILY AS NEEDED FOR RHINITIS. (Patient not taking: Reported on 02/19/2022), Disp: 90 mL, Rfl: 1   azithromycin (ZITHROMAX) 250 MG tablet, Take as directed (Patient not taking: Reported on 02/19/2022), Disp: 6 tablet, Rfl: 0   doxazosin (CARDURA) 4 MG tablet, Take 4 mg by mouth at bedtime.  , Disp: , Rfl:    escitalopram (LEXAPRO) 5 MG tablet, Take 5 mg by mouth daily. (Patient not taking: Reported on 02/19/2022), Disp: , Rfl:    fluticasone (FLONASE) 50 MCG/ACT nasal spray, Place 2 sprays into both nostrils daily., Disp: 16 g, Rfl: 11   guaiFENesin (MUCINEX) 600 MG 12 hr tablet, Take 1 tablet (600 mg total) by mouth 2 (two) times daily., Disp: 60 tablet, Rfl: 2   losartan (COZAAR) 25 MG tablet, Take 25 mg by mouth daily., Disp: , Rfl:    metoprolol tartrate (LOPRESSOR) 25 MG tablet, Take 25 mg by mouth 2 (two) times daily., Disp: , Rfl:    Nintedanib (OFEV) 150 MG CAPS, Take 1 capsule (150 mg total) by mouth 2 (two) times daily., Disp: 90 capsule, Rfl: 0    pantoprazole (PROTONIX) 40 MG tablet, Take 40 mg by mouth daily., Disp: , Rfl:    pravastatin (PRAVACHOL) 20 MG tablet, Take 20 mg by mouth every evening. , Disp: , Rfl:    predniSONE (DELTASONE) 20 MG tablet, Take '40mg'$  for 5 days (Patient not taking: Reported on 02/19/2022), Disp: 10 tablet, Rfl: 0  Past Medical History: Past Medical History:  Diagnosis Date   Anxiety    Arthritis    back   Back pain, chronic    Colon polyp    Diverticulitis    GERD (gastroesophageal reflux disease)    Heart palpitations    High cholesterol    Hypertension    Irritable bowel syndrome    Osteoporosis    Psoriasis    Pulmonary fibrosis (HCC)    Sleep apnea    c- pap    Tobacco Use: Social History   Tobacco Use  Smoking Status Former   Packs/day: 2.00   Years: 11.00   Total pack years: 22.00   Types: Cigarettes   Start date: 05/22/1962   Quit date: 05/21/1973   Years since quitting: 48.8  Smokeless Tobacco Never  Tobacco Comments   Quit 25 yrs now     Labs: Review Flowsheet       Latest Ref Rng & Units 03/26/2021  Labs for ITP Cardiac and Pulmonary Rehab  Cholestrol 0 - 200 mg/dL 140   LDL (calc) 0 -  99 mg/dL 83   HDL-C >40 mg/dL 43   Trlycerides <150 mg/dL 71     Capillary Blood Glucose: No results found for: "GLUCAP"   Pulmonary Assessment Scores:  Pulmonary Assessment Scores     Row Name 02/20/22 1253         ADL UCSD   ADL Phase Entry     SOB Score total 45     Rest 0     Walk 2     Stairs 3     Bath 1     Dress 2     Shop 2       CAT Score   CAT Score 16       mMRC Score   mMRC Score 1             UCSD: Self-administered rating of dyspnea associated with activities of daily living (ADLs) 6-point scale (0 = "not at all" to 5 = "maximal or unable to do because of breathlessness")  Scoring Scores range from 0 to 120.  Minimally important difference is 5 units  CAT: CAT can identify the health impairment of COPD patients and is better correlated  with disease progression.  CAT has a scoring range of zero to 40. The CAT score is classified into four groups of low (less than 10), medium (10 - 20), high (21-30) and very high (31-40) based on the impact level of disease on health status. A CAT score over 10 suggests significant symptoms.  A worsening CAT score could be explained by an exacerbation, poor medication adherence, poor inhaler technique, or progression of COPD or comorbid conditions.  CAT MCID is 2 points  mMRC: mMRC (Modified Medical Research Council) Dyspnea Scale is used to assess the degree of baseline functional disability in patients of respiratory disease due to dyspnea. No minimal important difference is established. A decrease in score of 1 point or greater is considered a positive change.   Pulmonary Function Assessment:   Exercise Target Goals: Exercise Program Goal: Individual exercise prescription set using results from initial 6 min walk test and THRR while considering  patient's activity barriers and safety.   Exercise Prescription Goal: Initial exercise prescription builds to 30-45 minutes a day of aerobic activity, 2-3 days per week.  Home exercise guidelines will be given to patient during program as part of exercise prescription that the participant will acknowledge.  Activity Barriers & Risk Stratification:  Activity Barriers & Cardiac Risk Stratification - 02/20/22 1313       Activity Barriers & Cardiac Risk Stratification   Activity Barriers Joint Problems    Cardiac Risk Stratification High             6 Minute Walk:  6 Minute Walk     Row Name 02/20/22 1317         6 Minute Walk   Phase Initial     Distance 1350 feet     Walk Time 6 minutes     # of Rest Breaks 0     MPH 2.55     METS 2.41     RPE 13     Perceived Dyspnea  12     VO2 Peak 8.46     Symptoms No     Resting HR 73 bpm     Resting BP 128/68     Resting Oxygen Saturation  96 %     Exercise Oxygen Saturation  during  6 min walk 91 %  Max Ex. HR 116 bpm     Max Ex. BP 160/70     2 Minute Post BP 130/68       Interval HR   1 Minute HR 73     2 Minute HR 73     3 Minute HR 73     4 Minute HR 80     5 Minute HR 116     6 Minute HR 116     2 Minute Post HR 76     Interval Heart Rate? Yes       Interval Oxygen   Interval Oxygen? Yes     Baseline Oxygen Saturation % 96 %     1 Minute Oxygen Saturation % 95 %     1 Minute Liters of Oxygen 0 L     2 Minute Oxygen Saturation % 92 %     2 Minute Liters of Oxygen 0 L     3 Minute Oxygen Saturation % 91 %     3 Minute Liters of Oxygen 0 L     4 Minute Oxygen Saturation % 93 %     4 Minute Liters of Oxygen 0 L     5 Minute Oxygen Saturation % 93 %     5 Minute Liters of Oxygen 0 L     6 Minute Oxygen Saturation % 91 %     6 Minute Liters of Oxygen 0 L     2 Minute Post Oxygen Saturation % 96 %     2 Minute Post Liters of Oxygen 0 L              Oxygen Initial Assessment:  Oxygen Initial Assessment - 02/20/22 1252       Home Oxygen   Home Oxygen Device None    Sleep Oxygen Prescription CPAP    Liters per minute 0    Home Exercise Oxygen Prescription None    Home Resting Oxygen Prescription None    Compliance with Home Oxygen Use Yes      Initial 6 min Walk   Oxygen Used None      Program Oxygen Prescription   Program Oxygen Prescription None      Intervention   Short Term Goals To learn and understand importance of monitoring SPO2 with pulse oximeter and demonstrate accurate use of the pulse oximeter.;To learn and understand importance of maintaining oxygen saturations>88%;To learn and demonstrate proper pursed lip breathing techniques or other breathing techniques.     Long  Term Goals Verbalizes importance of monitoring SPO2 with pulse oximeter and return demonstration;Maintenance of O2 saturations>88%;Exhibits proper breathing techniques, such as pursed lip breathing or other method taught during program session;Compliance with  respiratory medication             Oxygen Re-Evaluation:   Oxygen Discharge (Final Oxygen Re-Evaluation):   Initial Exercise Prescription:  Initial Exercise Prescription - 02/20/22 1300       Date of Initial Exercise RX and Referring Provider   Date 02/20/22    Referring Provider Dr. Vaughan Browner    Expected Discharge Date 06/24/22      Treadmill   MPH 1    Grade 0    Minutes 17      NuStep   Level 1    SPM 60    Minutes 22      Prescription Details   Frequency (times per week) 2    Duration Progress to 30 minutes of continuous aerobic without signs/symptoms of  physical distress      Intensity   THRR 40-80% of Max Heartrate 58-116    Ratings of Perceived Exertion 11-13    Perceived Dyspnea 0-4      Resistance Training   Training Prescription Yes    Weight 4    Reps 10-15             Perform Capillary Blood Glucose checks as needed.  Exercise Prescription Changes:   Exercise Prescription Changes     Row Name 03/04/22 1100             Response to Exercise   Blood Pressure (Admit) 130/70       Blood Pressure (Exercise) 142/80       Blood Pressure (Exit) 136/72       Heart Rate (Admit) 60 bpm       Heart Rate (Exercise) 77 bpm       Heart Rate (Exit) 67 bpm       Oxygen Saturation (Admit) 97 %       Oxygen Saturation (Exercise) 96 %       Oxygen Saturation (Exit) 96 %       Rating of Perceived Exertion (Exercise) 12       Perceived Dyspnea (Exercise) 12       Duration Continue with 30 min of aerobic exercise without signs/symptoms of physical distress.       Intensity THRR unchanged         Progression   Progression Continue to progress workloads to maintain intensity without signs/symptoms of physical distress.         Resistance Training   Training Prescription Yes       Weight 5       Reps 10-15       Time 10 Minutes         NuStep   Level 2       SPM 109       Minutes 39       METs 2.1                Exercise  Comments:   Exercise Goals and Review:   Exercise Goals     Row Name 02/20/22 1319 03/04/22 1149           Exercise Goals   Increase Physical Activity Yes Yes      Intervention Provide advice, education, support and counseling about physical activity/exercise needs.;Develop an individualized exercise prescription for aerobic and resistive training based on initial evaluation findings, risk stratification, comorbidities and participant's personal goals. Provide advice, education, support and counseling about physical activity/exercise needs.;Develop an individualized exercise prescription for aerobic and resistive training based on initial evaluation findings, risk stratification, comorbidities and participant's personal goals.      Expected Outcomes Short Term: Attend rehab on a regular basis to increase amount of physical activity.;Long Term: Add in home exercise to make exercise part of routine and to increase amount of physical activity.;Long Term: Exercising regularly at least 3-5 days a week. Short Term: Attend rehab on a regular basis to increase amount of physical activity.;Long Term: Add in home exercise to make exercise part of routine and to increase amount of physical activity.;Long Term: Exercising regularly at least 3-5 days a week.      Increase Strength and Stamina Yes Yes      Intervention Provide advice, education, support and counseling about physical activity/exercise needs.;Develop an individualized exercise prescription for aerobic and resistive training based on initial evaluation  findings, risk stratification, comorbidities and participant's personal goals. Provide advice, education, support and counseling about physical activity/exercise needs.;Develop an individualized exercise prescription for aerobic and resistive training based on initial evaluation findings, risk stratification, comorbidities and participant's personal goals.      Expected Outcomes Short Term: Increase  workloads from initial exercise prescription for resistance, speed, and METs.;Short Term: Perform resistance training exercises routinely during rehab and add in resistance training at home;Long Term: Improve cardiorespiratory fitness, muscular endurance and strength as measured by increased METs and functional capacity (6MWT) Short Term: Increase workloads from initial exercise prescription for resistance, speed, and METs.;Short Term: Perform resistance training exercises routinely during rehab and add in resistance training at home;Long Term: Improve cardiorespiratory fitness, muscular endurance and strength as measured by increased METs and functional capacity (6MWT)      Able to understand and use rate of perceived exertion (RPE) scale Yes Yes      Intervention Provide education and explanation on how to use RPE scale Provide education and explanation on how to use RPE scale      Expected Outcomes Short Term: Able to use RPE daily in rehab to express subjective intensity level;Long Term:  Able to use RPE to guide intensity level when exercising independently Short Term: Able to use RPE daily in rehab to express subjective intensity level;Long Term:  Able to use RPE to guide intensity level when exercising independently      Able to understand and use Dyspnea scale Yes Yes      Intervention Provide education and explanation on how to use Dyspnea scale Provide education and explanation on how to use Dyspnea scale      Expected Outcomes Short Term: Able to use Dyspnea scale daily in rehab to express subjective sense of shortness of breath during exertion;Long Term: Able to use Dyspnea scale to guide intensity level when exercising independently Short Term: Able to use Dyspnea scale daily in rehab to express subjective sense of shortness of breath during exertion;Long Term: Able to use Dyspnea scale to guide intensity level when exercising independently      Knowledge and understanding of Target Heart Rate  Range (THRR) Yes Yes      Intervention Provide education and explanation of THRR including how the numbers were predicted and where they are located for reference Provide education and explanation of THRR including how the numbers were predicted and where they are located for reference      Expected Outcomes Short Term: Able to state/look up THRR;Short Term: Able to use daily as guideline for intensity in rehab;Long Term: Able to use THRR to govern intensity when exercising independently Short Term: Able to state/look up THRR;Short Term: Able to use daily as guideline for intensity in rehab;Long Term: Able to use THRR to govern intensity when exercising independently      Understanding of Exercise Prescription Yes Yes      Intervention Provide education, explanation, and written materials on patient's individual exercise prescription Provide education, explanation, and written materials on patient's individual exercise prescription      Expected Outcomes Short Term: Able to explain program exercise prescription;Long Term: Able to explain home exercise prescription to exercise independently Short Term: Able to explain program exercise prescription;Long Term: Able to explain home exercise prescription to exercise independently               Exercise Goals Re-Evaluation :  Exercise Goals Re-Evaluation     [place] Name 03/04/22 1150  Exercise Goal Re-Evaluation   Exercise Goals Review Increase Physical Activity;Increase Strength and Stamina;Able to understand and use rate of perceived exertion (RPE) scale;Able to understand and use Dyspnea scale;Knowledge and understanding of Target Heart Rate Range (THRR);Understanding of Exercise Prescription       Comments Pt has completed 3 sessions of PR. He has L hip bursitis that bothers him when exercising. He was not able to use the treadmill due to the impact on his hip causing the bursitis pain to worsen. He is currently using the stepper both  times and stated that his hip does not bother him when exercising. He has increased his  workload already and is currently exercising at 2.1 METs on the stepper. Will continue to monitor and progress as able.       Expected Outcomes Through exercise at rehab and home, the patient will meet their stated goals.                Discharge Exercise Prescription (Final Exercise Prescription Changes):  Exercise Prescription Changes - 03/04/22 1100       Response to Exercise   Blood Pressure (Admit) 130/70    Blood Pressure (Exercise) 142/80    Blood Pressure (Exit) 136/72    Heart Rate (Admit) 60 bpm    Heart Rate (Exercise) 77 bpm    Heart Rate (Exit) 67 bpm    Oxygen Saturation (Admit) 97 %    Oxygen Saturation (Exercise) 96 %    Oxygen Saturation (Exit) 96 %    Rating of Perceived Exertion (Exercise) 12    Perceived Dyspnea (Exercise) 12    Duration Continue with 30 min of aerobic exercise without signs/symptoms of physical distress.    Intensity THRR unchanged      Progression   Progression Continue to progress workloads to maintain intensity without signs/symptoms of physical distress.      Resistance Training   Training Prescription Yes    Weight 5    Reps 10-15    Time 10 Minutes      NuStep   Level 2    SPM 109    Minutes 39    METs 2.1             Nutrition:  Target Goals: Understanding of nutrition guidelines, daily intake of sodium '1500mg'$ , cholesterol '200mg'$ , calories 30% from fat and 7% or less from saturated fats, daily to have 5 or more servings of fruits and vegetables.  Biometrics:  Pre Biometrics - 02/20/22 1320       Pre Biometrics   Height '5\' 10"'$  (1.778 m)    Weight 126.6 kg    Waist Circumference 48.5 inches    Hip Circumference 50 inches    Waist to Hip Ratio 0.97 %    BMI (Calculated) 40.05    Triceps Skinfold 20 mm    % Body Fat 37.1 %    Grip Strength 44.3 kg    Flexibility 0 in    Single Leg Stand 6.68 seconds               Nutrition Therapy Plan and Nutrition Goals:  Nutrition Therapy & Goals - 02/26/22 1346       Personal Nutrition Goals   Comments Patient's diet assessment score was 50. We offer 2 educational sessions on heart heatlhy nutrition with handouts and assistance with RD referral if patient is interested.      Intervention Plan   Expected Outcomes Short Term Goal: Understand basic principles of dietary content, such  as calories, fat, sodium, cholesterol and nutrients.             Nutrition Assessments:  Nutrition Assessments - 02/20/22 1256       MEDFICTS Scores   Pre Score 50            MEDIFICTS Score Key: ?70 Need to make dietary changes  40-70 Heart Healthy Diet ? 40 Therapeutic Level Cholesterol Diet   Picture Your Plate Scores: <12 Unhealthy dietary pattern with much room for improvement. 41-50 Dietary pattern unlikely to meet recommendations for good health and room for improvement. 51-60 More healthful dietary pattern, with some room for improvement.  >60 Healthy dietary pattern, although there may be some specific behaviors that could be improved.    Nutrition Goals Re-Evaluation:   Nutrition Goals Discharge (Final Nutrition Goals Re-Evaluation):   Psychosocial: Target Goals: Acknowledge presence or absence of significant depression and/or stress, maximize coping skills, provide positive support system. Participant is able to verbalize types and ability to use techniques and skills needed for reducing stress and depression.  Initial Review & Psychosocial Screening:  Initial Psych Review & Screening - 02/20/22 1314       Initial Review   Current issues with Current Stress Concerns;Current Sleep Concerns;Current Anxiety/Panic    Source of Stress Concerns Chronic Illness    Comments His ILD is progressing, so this does stress him out and cause some anxiety      Family Dynamics   Good Support System? Yes    Comments His support system includes his  wife and his two sons.      Barriers   Psychosocial barriers to participate in program There are no identifiable barriers or psychosocial needs.      Screening Interventions   Interventions Encouraged to exercise    Expected Outcomes Short Term goal: Identification and review with participant of any Quality of Life or Depression concerns found by scoring the questionnaire.;Long Term goal: The participant improves quality of Life and PHQ9 Scores as seen by post scores and/or verbalization of changes;Long Term Goal: Stressors or current issues are controlled or eliminated.             Quality of Life Scores:  Quality of Life - 02/20/22 1320       Quality of Life   Select Quality of Life      Quality of Life Scores   Health/Function Pre 20.16 %    Socioeconomic Pre 23.57 %    Psych/Spiritual Pre 22.43 %    Family Pre 28.8 %    GLOBAL Pre 22.53 %            Scores of 19 and below usually indicate a poorer quality of life in these areas.  A difference of  2-3 points is a clinically meaningful difference.  A difference of 2-3 points in the total score of the Quality of Life Index has been associated with significant improvement in overall quality of life, self-image, physical symptoms, and general health in studies assessing change in quality of life.   PHQ-9: Review Flowsheet       02/20/2022  Depression screen PHQ 2/9  Decreased Interest 0  Down, Depressed, Hopeless 0  PHQ - 2 Score 0  Altered sleeping 0  Tired, decreased energy 1  Change in appetite 0  Feeling bad or failure about yourself  0  Trouble concentrating 0  Moving slowly or fidgety/restless 0  Suicidal thoughts 0  PHQ-9 Score 1  Difficult doing work/chores Not difficult  at all   Interpretation of Total Score  Total Score Depression Severity:  1-4 = Minimal depression, 5-9 = Mild depression, 10-14 = Moderate depression, 15-19 = Moderately severe depression, 20-27 = Severe depression   Psychosocial  Evaluation and Intervention:  Psychosocial Evaluation - 02/20/22 1339       Psychosocial Evaluation & Interventions   Interventions Encouraged to exercise with the program and follow exercise prescription    Comments Pt has no barriers to participate in PR. He does anxiety, sleep, and stress, but all of which are well controlled. He has no other identifiable psychosocial issues. He takes xanax for his sleep. He is prescribed 0.5 mg tid, but he reports that he only takes half of a tablet a few nights per week. His anxiety and stress stem from his ILD. He was told at his last visit with Dr. Vaughan Browner that his ILD is progressing, so this does cause him stress and anxiety. Dr. Gerarda Fraction, his PCP, prescribed him Lexapro for the anxiety, but he reports that he is not taking it. He has been taking ofev, an antifibrotic medication, for his ILD, and this is giving him hope for his future. He is also mentions how lucky he is to not be experiencing any side effects from ofev. He scored a 1 on his PHQ-9, and this is due to his lack of energy. He reports that he has a good support system from his wife and two sons. His goals while in the program are to decrease his SOB with exertion and to lose weight. He is eager to start the program.    Expected Outcomes Pt's stress, anxiety, and sleep will continue to be managed, and he will have no other identifiable psychosocial issues.    Continue Psychosocial Services  No Follow up required             Psychosocial Re-Evaluation:  Psychosocial Re-Evaluation     [place] Name 02/26/22 1338             Psychosocial Re-Evaluation   Current issues with Current Anxiety/Panic;Current Stress Concerns;Current Sleep Concerns       Comments Patient is new to the program. He has completed 1 session. His sleep and anxiety continues to be managed with xanax. He continues to have no psychosocial barriers identified. We will continue monitor his progress.       Expected Outcomes Patient  will continue to have no psychosocial barriers identified.       Interventions Relaxation education;Stress management education;Encouraged to attend Pulmonary Rehabilitation for the exercise       Continue Psychosocial Services  No Follow up required         Initial Review   Source of Stress Concerns Chronic Illness       Comments His ILD is progressing, so this does stress him out and cause some anxiety                Psychosocial Discharge (Final Psychosocial Re-Evaluation):  Psychosocial Re-Evaluation - 02/26/22 1338       Psychosocial Re-Evaluation   Current issues with Current Anxiety/Panic;Current Stress Concerns;Current Sleep Concerns    Comments Patient is new to the program. He has completed 1 session. His sleep and anxiety continues to be managed with xanax. He continues to have no psychosocial barriers identified. We will continue monitor his progress.    Expected Outcomes Patient will continue to have no psychosocial barriers identified.    Interventions Relaxation education;Stress management education;Encouraged to attend Pulmonary Rehabilitation for  the exercise    Continue Psychosocial Services  No Follow up required      Initial Review   Source of Stress Concerns Chronic Illness    Comments His ILD is progressing, so this does stress him out and cause some anxiety              Education: Education Goals: Education classes will be provided on a weekly basis, covering required topics. Participant will state understanding/return demonstration of topics presented.  Learning Barriers/Preferences:  Learning Barriers/Preferences - 02/20/22 1319       Learning Barriers/Preferences   Learning Barriers None    Learning Preferences Written Material             Education Topics: How Lungs Work and Diseases: - Discuss the anatomy of the lungs and diseases that can affect the lungs, such as COPD.   Exercise: -Discuss the importance of exercise, FITT  principles of exercise, normal and abnormal responses to exercise, and how to exercise safely.   Environmental Irritants: -Discuss types of environmental irritants and how to limit exposure to environmental irritants.   Meds/Inhalers and oxygen: - Discuss respiratory medications, definition of an inhaler and oxygen, and the proper way to use an inhaler and oxygen.   Energy Saving Techniques: - Discuss methods to conserve energy and decrease shortness of breath when performing activities of daily living.  Flowsheet Row PULMONARY REHAB OTHER RESPIRATORY from 02/27/2022 in [place]  Date 02/27/22  Educator HB  Instruction Review Code 1- Verbalizes Understanding       Bronchial Hygiene / Breathing Techniques: - Discuss breathing mechanics, pursed-lip breathing technique,  proper posture, effective ways to clear airways, and other functional breathing techniques   Cleaning Equipment: - Provides group verbal and written instruction about the health risks of elevated stress, cause of high stress, and healthy ways to reduce stress.   Nutrition I: Fats: - Discuss the types of cholesterol, what cholesterol does to the body, and how cholesterol levels can be controlled.   Nutrition II: Labels: -Discuss the different components of food labels and how to read food labels.   Respiratory Infections: - Discuss the signs and symptoms of respiratory infections, ways to prevent respiratory infections, and the importance of seeking medical treatment when having a respiratory infection.   Stress I: Signs and Symptoms: - Discuss the causes of stress, how stress may lead to anxiety and depression, and ways to limit stress.   Stress II: Relaxation: -Discuss relaxation techniques to limit stress.   Oxygen for Home/Travel: - Discuss how to prepare for travel when on oxygen and proper ways to transport and store oxygen to ensure safety.   Knowledge Questionnaire  Score:  Knowledge Questionnaire Score - 02/20/22 1256       Knowledge Questionnaire Score   Pre Score 17/18             Core Components/Risk Factors/Patient Goals at Admission:  Personal Goals and Risk Factors at Admission - 02/20/22 1322       Core Components/Risk Factors/Patient Goals on Admission    Weight Management Yes;Obesity;Weight Loss    Intervention Obesity: Provide education and appropriate resources to help participant work on and attain dietary goals.;Weight Management/Obesity: Establish reasonable short term and long term weight goals.;Weight Management: Provide education and appropriate resources to help participant work on and attain dietary goals.;Weight Management: Develop a combined nutrition and exercise program designed to reach desired caloric intake, while maintaining appropriate intake of nutrient and fiber, sodium and fats,  and appropriate energy expenditure required for the weight goal.    Expected Outcomes Short Term: Continue to assess and modify interventions until short term weight is achieved;Long Term: Adherence to nutrition and physical activity/exercise program aimed toward attainment of established weight goal;Weight Maintenance: Understanding of the daily nutrition guidelines, which includes 25-35% calories from fat, 7% or less cal from saturated fats, less than '200mg'$  cholesterol, less than 1.5gm of sodium, & 5 or more servings of fruits and vegetables daily;Weight Loss: Understanding of general recommendations for a balanced deficit meal plan, which promotes 1-2 lb weight loss per week and includes a negative energy balance of [phone] kcal/d;Understanding recommendations for meals to include 15-35% energy as protein, 25-35% energy from fat, 35-60% energy from carbohydrates, less than '200mg'$  of dietary cholesterol, 20-35 gm of total fiber daily;Understanding of distribution of calorie intake throughout the day with the consumption of 4-5 meals/snacks     Improve shortness of breath with ADL's Yes    Intervention Provide education, individualized exercise plan and daily activity instruction to help decrease symptoms of SOB with activities of daily living.    Expected Outcomes Short Term: Improve cardiorespiratory fitness to achieve a reduction of symptoms when performing ADLs;Long Term: Be able to perform more ADLs without symptoms or delay the onset of symptoms             Core Components/Risk Factors/Patient Goals Review:   Goals and Risk Factor Review     Row Name 02/26/22 1343             Core Components/Risk Factors/Patient Goals Review   Personal Goals Review Weight Management/Obesity;Other;Improve shortness of breath with ADL's       Review Patient was referred to PR with ILD. He has completed 1 session. His weight is 126.6 KG. He exercises on RA with O2 sats at 92-95%. His persoanl goals are to decrease his SOB with exertion and lose weight. We will continue to monitor his progress as he works towards meeting these goal.       Expected Outcomes Patient will complete the program meeting both perosnal and program goals.                Core Components/Risk Factors/Patient Goals at Discharge (Final Review):   Goals and Risk Factor Review - 02/26/22 1343       Core Components/Risk Factors/Patient Goals Review   Personal Goals Review Weight Management/Obesity;Other;Improve shortness of breath with ADL's    Review Patient was referred to PR with ILD. He has completed 1 session. His weight is 126.6 KG. He exercises on RA with O2 sats at 92-95%. His persoanl goals are to decrease his SOB with exertion and lose weight. We will continue to monitor his progress as he works towards meeting these goal.    Expected Outcomes Patient will complete the program meeting both perosnal and program goals.             ITP Comments:   Comments: ITP REVIEW Pt is making expected progress toward pulmonary rehab goals after completing 4  sessions. Recommend continued exercise, life style modification, education, and utilization of breathing techniques to increase stamina and strength and decrease shortness of breath with exertion.

## 2022-03-06 ENCOUNTER — Encounter (HOSPITAL_COMMUNITY): Payer: Medicare HMO

## 2022-03-11 ENCOUNTER — Encounter (HOSPITAL_COMMUNITY)
Admission: RE | Admit: 2022-03-11 | Discharge: 2022-03-11 | Disposition: A | Discharge status: Home / Self Care | Payer: Medicare HMO | Source: Ambulatory Visit | Attending: Pulmonary Disease | Admitting: Pulmonary Disease

## 2022-03-11 DIAGNOSIS — J849 Interstitial pulmonary disease, unspecified: Secondary | ICD-10-CM | POA: Insufficient documentation

## 2022-03-11 DIAGNOSIS — M7062 Trochanteric bursitis, left hip: Secondary | ICD-10-CM | POA: Diagnosis not present

## 2022-03-11 NOTE — Progress Notes (Signed)
Daily Session Note  Patient Details  Name: Alexander Nunez MRN: [id number] Date of Birth: Jan 12, 1946 Referring Provider:   Flowsheet Row PULMONARY REHAB OTHER RESP ORIENTATION from 02/20/2022 in [place]  Referring Provider Dr. Vaughan Browner       Encounter Date: 03/11/2022  Check In:  Session Check In - 03/11/22 1041       Check-In   Supervising physician immediately available to respond to emergencies [facility] MD immediately available    Physician(s) Dr Harl Bowie    Location [facility]    Staff Present Leana Roe, BS, Exercise Physiologist;Alontae Chaloux Hassell Done, RN, BSN;Dalton Fletcher MHA, MS, ACSM-CEP    Virtual Visit No    Medication changes reported     No    Fall or balance concerns reported    No    Tobacco Cessation No Change    Warm-up and Cool-down Performed as group-led instruction    Resistance Training Performed Yes    VAD Patient? No    PAD/SET Patient? No      Pain Assessment   Currently in Pain? No/denies    Pain Score 0-No pain    Multiple Pain Sites No             Capillary Blood Glucose: No results found for this or any previous visit (from the past 24 hour(s)).    Social History   Tobacco Use  Smoking Status Former   Packs/day: 2.00   Years: 11.00   Total pack years: 22.00   Types: Cigarettes   Start date: 05/22/1962   Quit date: 05/21/1973   Years since quitting: 48.8  Smokeless Tobacco Never  Tobacco Comments   Quit 25 yrs now     Goals Met:  Proper associated with RPD/PD & O2 Sat Independence with exercise equipment Using PLB without cueing & demonstrates good technique Exercise tolerated well Queuing for purse lip breathing No report of concerns or symptoms today Strength training completed today  Goals Unmet:  Not Applicable  Comments: Checkout at 1145.   Dr. Kathie Dike is Medical Director for [facility] Pulmonary Re[facility].

## 2022-03-13 ENCOUNTER — Encounter (HOSPITAL_COMMUNITY)
Admission: RE | Admit: 2022-03-13 | Discharge: 2022-03-13 | Disposition: A | Discharge status: Home / Self Care | Payer: Medicare HMO | Source: Ambulatory Visit | Attending: Pulmonary Disease | Admitting: Pulmonary Disease

## 2022-03-13 DIAGNOSIS — J849 Interstitial pulmonary disease, unspecified: Secondary | ICD-10-CM | POA: Diagnosis not present

## 2022-03-13 NOTE — Progress Notes (Signed)
Daily Session Note  Patient Details  Name: Alexander Nunez MRN: [id number] Date of Birth: 04-13-1945 Referring Provider:   Flowsheet Row PULMONARY REHAB OTHER RESP ORIENTATION from 02/20/2022 in [place]  Referring Provider Dr. Vaughan Browner       Encounter Date: 03/13/2022  Check In:  Session Check In - 03/13/22 1040       Check-In   Supervising physician immediately available to respond to emergencies [facility] MD immediately available    Physician(s) Dr Harl Bowie    [facility]    Staff Present Leana Roe, BS, Exercise Physiologist;Skyy Nilan Hassell Done, RN, BSN;Hillary Troutman BSN, RN    Virtual Visit No    Medication changes reported     No    Fall or balance concerns reported    No    Tobacco Cessation No Change    Warm-up and Cool-down Performed as group-led Higher education careers adviser Performed Yes    VAD Patient? No    PAD/SET Patient? No      Pain Assessment   Currently in Pain? Yes    Pain Score 5     Pain Location Hip    Pain Orientation Left    Pain Descriptors / Indicators Aching    Pain Type Chronic pain    Multiple Pain Sites No             Capillary Blood Glucose: No results found for this or any previous visit (from the past 24 hour(s)).    Social History   Tobacco Use  Smoking Status Former   Packs/day: 2.00   Years: 11.00   Total pack years: 22.00   Types: Cigarettes   Start date: 05/22/1962   Quit date: 05/21/1973   Years since quitting: 48.8  Smokeless Tobacco Never  Tobacco Comments   Quit 25 yrs now     Goals Met:  Proper associated with RPD/PD & O2 Sat Independence with exercise equipment Using PLB without cueing & demonstrates good technique Exercise tolerated well Queuing for purse lip breathing No report of concerns or symptoms today Strength training completed today  Goals Unmet:  Not Applicable  Comments: Checkout at 1145.   Dr. Kathie Dike is Medical Director for  [facility].

## 2022-03-18 ENCOUNTER — Encounter (HOSPITAL_COMMUNITY): Payer: Medicare HMO

## 2022-03-20 ENCOUNTER — Encounter (HOSPITAL_COMMUNITY)
Admission: RE | Admit: 2022-03-20 | Discharge: 2022-03-20 | Disposition: A | Discharge status: Home / Self Care | Payer: Medicare HMO | Source: Ambulatory Visit | Attending: Pulmonary Disease | Admitting: Pulmonary Disease

## 2022-03-20 DIAGNOSIS — J849 Interstitial pulmonary disease, unspecified: Secondary | ICD-10-CM

## 2022-03-20 NOTE — Progress Notes (Signed)
Daily Session Note  Patient Details  Name: Alexander Nunez MRN: [id number] Date of Birth: 06/02/1945 Referring Provider:   Flowsheet Row PULMONARY REHAB OTHER RESP ORIENTATION from 02/20/2022 in [place]  Referring Provider Dr. Vaughan Browner       Encounter Date: 03/20/2022  Check In:  Session Check In - 03/20/22 1038       Check-In   Supervising physician immediately available to respond to emergencies [facility] MD immediately available    Physician(s) Dr. Domenic Polite    Location [facility]    Staff Present Hoy Register MHA, MS, ACSM-CEP;Domanic Matusek BSN, RN;Daphyne Hassell Done, RN, BSN    Virtual Visit No    Medication changes reported     No    Fall or balance concerns reported    No    Tobacco Cessation No Change    Warm-up and Cool-down Performed as group-led Higher education careers adviser Performed Yes    VAD Patient? No    PAD/SET Patient? No      Pain Assessment   Currently in Pain? Yes    Pain Score 4     Pain Location Hip    Pain Orientation Left    Pain Descriptors / Indicators Aching;Sore    Pain Type Chronic pain    Pain Frequency Constant    Multiple Pain Sites No             Capillary Blood Glucose: No results found for this or any previous visit (from the past 24 hour(s)).    Social History   Tobacco Use  Smoking Status Former   Packs/day: 2.00   Years: 11.00   Total pack years: 22.00   Types: Cigarettes   Start date: 05/22/1962   Quit date: 05/21/1973   Years since quitting: 48.8  Smokeless Tobacco Never  Tobacco Comments   Quit 25 yrs now     Goals Met:  Proper associated with RPD/PD & O2 Sat Independence with exercise equipment Using PLB without cueing & demonstrates good technique Exercise tolerated well Queuing for purse lip breathing No report of concerns or symptoms today Strength training completed today  Goals Unmet:  Not Applicable  Comments: check out at 11:45   Dr. Kathie Dike is Medical Director for [facility] Pulmonary Re[facility].

## 2022-03-23 DIAGNOSIS — G4733 Obstructive sleep apnea (adult) (pediatric): Secondary | ICD-10-CM | POA: Diagnosis not present

## 2022-03-23 DIAGNOSIS — I10 Essential (primary) hypertension: Secondary | ICD-10-CM | POA: Diagnosis not present

## 2022-03-24 DIAGNOSIS — G4733 Obstructive sleep apnea (adult) (pediatric): Secondary | ICD-10-CM | POA: Diagnosis not present

## 2022-03-24 DIAGNOSIS — I10 Essential (primary) hypertension: Secondary | ICD-10-CM | POA: Diagnosis not present

## 2022-03-24 DIAGNOSIS — R69 Illness, unspecified: Secondary | ICD-10-CM | POA: Diagnosis not present

## 2022-03-25 ENCOUNTER — Encounter (HOSPITAL_COMMUNITY)
Admission: RE | Admit: 2022-03-25 | Discharge: 2022-03-25 | Disposition: A | Discharge status: Home / Self Care | Payer: Medicare HMO | Source: Ambulatory Visit | Attending: Pulmonary Disease | Admitting: Pulmonary Disease

## 2022-03-25 DIAGNOSIS — J849 Interstitial pulmonary disease, unspecified: Secondary | ICD-10-CM | POA: Diagnosis not present

## 2022-03-25 NOTE — Progress Notes (Signed)
Daily Session Note  Patient Details  Name: Alexander Nunez MRN: [id number] Date of Birth: 24-Nov-1945 Referring Provider:   Flowsheet Row PULMONARY REHAB OTHER RESP ORIENTATION from 02/20/2022 in [place]  Referring Provider Dr. Vaughan Browner       Encounter Date: 03/25/2022  Check In:  Session Check In - 03/25/22 1045       Check-In   Supervising physician immediately available to respond to emergencies [facility] MD immediately available    Physician(s) Dr. Dellia Cloud    Location [facility]    Staff Present Hoy Register MHA, MS, ACSM-CEP;Leana Roe, BS, Exercise Physiologist    Virtual Visit No    Medication changes reported     No    Fall or balance concerns reported    No    Tobacco Cessation No Change    Warm-up and Cool-down Performed as group-led instruction    Resistance Training Performed Yes    VAD Patient? No    PAD/SET Patient? No      Pain Assessment   Currently in Pain? Yes    Pain Score 4     Pain Location Hip    Pain Orientation Left    Pain Descriptors / Indicators Aching;Sore    Pain Type Chronic pain    Pain Onset More than a month ago    Pain Frequency Constant    Multiple Pain Sites No             Capillary Blood Glucose: No results found for this or any previous visit (from the past 24 hour(s)).    Social History   Tobacco Use  Smoking Status Former   Packs/day: 2.00   Years: 11.00   Total pack years: 22.00   Types: Cigarettes   Start date: 05/22/1962   Quit date: 05/21/1973   Years since quitting: 48.8  Smokeless Tobacco Never  Tobacco Comments   Quit 25 yrs now     Goals Met:  Proper associated with RPD/PD & O2 Sat Independence with exercise equipment Using PLB without cueing & demonstrates good technique Exercise tolerated well Queuing for purse lip breathing No report of concerns or symptoms today Strength training completed today  Goals Unmet:  Not Applicable  Comments: checkout  time is 1145   Dr. Kathie Dike is Medical Director for [facility] Pulmonary Re[facility].

## 2022-03-25 NOTE — Progress Notes (Signed)
I have reviewed a Home Exercise Prescription with [name] is not currently exercising at home.  The patient was advised to walk 3 days a week for 30-45 minutes.  [name] and I discussed how to progress their exercise prescription.  The patient stated that their goals were to strengthen his lungs and weight loss.  The patient stated that they understand the exercise prescription.  We reviewed exercise guidelines, target heart rate during exercise, RPE Scale, weather conditions, NTG use, endpoints for exercise, warmup and cool down.  Patient is encouraged to come to me with any questions. I will continue to follow up with the patient to assist them with progression and safety.

## 2022-03-27 ENCOUNTER — Encounter (HOSPITAL_COMMUNITY)
Admission: RE | Admit: 2022-03-27 | Discharge: 2022-03-27 | Disposition: A | Discharge status: Home / Self Care | Payer: Medicare HMO | Source: Ambulatory Visit | Attending: Pulmonary Disease | Admitting: Pulmonary Disease

## 2022-03-27 DIAGNOSIS — J849 Interstitial pulmonary disease, unspecified: Secondary | ICD-10-CM

## 2022-03-27 NOTE — Progress Notes (Signed)
Daily Session Note  Patient Details  Name: Alexander Nunez MRN: [id number] Date of Birth: 20-Aug-1945 Referring Provider:   Flowsheet Row PULMONARY REHAB OTHER RESP ORIENTATION from 02/20/2022 in [place]  Referring Provider Dr. Vaughan Browner       Encounter Date: 03/27/2022  Check In:  Session Check In - 03/27/22 1045       Check-In   Supervising physician immediately available to respond to emergencies [facility] MD immediately available    Physician(s) Dr. Dellia Cloud    Location [facility]    Staff Present Hoy Register MHA, MS, ACSM-CEP;Leana Roe, BS, Exercise Physiologist    Virtual Visit No    Medication changes reported     No    Fall or balance concerns reported    No    Tobacco Cessation No Change    Warm-up and Cool-down Performed as group-led instruction    Resistance Training Performed Yes    VAD Patient? No    PAD/SET Patient? No      Pain Assessment   Currently in Pain? Yes    Pain Score 4     Pain Location Hip    Pain Orientation Left    Pain Descriptors / Indicators Aching;Sore    Pain Type Chronic pain    Pain Onset More than a month ago    Pain Frequency Constant    Multiple Pain Sites No             Capillary Blood Glucose: No results found for this or any previous visit (from the past 24 hour(s)).    Social History   Tobacco Use  Smoking Status Former   Packs/day: 2.00   Years: 11.00   Total pack years: 22.00   Types: Cigarettes   Start date: 05/22/1962   Quit date: 05/21/1973   Years since quitting: 48.8  Smokeless Tobacco Never  Tobacco Comments   Quit 25 yrs now     Goals Met:  Proper associated with RPD/PD & O2 Sat Independence with exercise equipment Using PLB without cueing & demonstrates good technique Exercise tolerated well Queuing for purse lip breathing No report of concerns or symptoms today Strength training completed today  Goals Unmet:  Not Applicable  Comments: checkout  time is 1145   Dr. Kathie Dike is Medical Director for [facility] Pulmonary Rehab.

## 2022-03-28 ENCOUNTER — Encounter: Payer: Self-pay | Admitting: Pulmonary Disease

## 2022-03-28 ENCOUNTER — Ambulatory Visit (INDEPENDENT_AMBULATORY_CARE_PROVIDER_SITE_OTHER): Payer: Medicare HMO | Admitting: Pulmonary Disease

## 2022-03-28 VITALS — BP 142/80 | HR 61 | Temp 97.9°F | Ht 71.0 in | Wt 276.0 lb

## 2022-03-28 DIAGNOSIS — G4733 Obstructive sleep apnea (adult) (pediatric): Secondary | ICD-10-CM | POA: Diagnosis not present

## 2022-03-28 DIAGNOSIS — J849 Interstitial pulmonary disease, unspecified: Secondary | ICD-10-CM

## 2022-03-28 DIAGNOSIS — Z5181 Encounter for therapeutic drug level monitoring: Secondary | ICD-10-CM

## 2022-03-28 LAB — CBC WITH DIFFERENTIAL/PLATELET
Basophils Absolute: 0.1 10*3/uL (ref 0.0–0.1)
Basophils Relative: 1 % (ref 0.0–3.0)
Eosinophils Absolute: 0.4 10*3/uL (ref 0.0–0.7)
Eosinophils Relative: 4.4 % (ref 0.0–5.0)
HCT: 41.2 % (ref 39.0–52.0)
Hemoglobin: 14.1 g/dL (ref 13.0–17.0)
Lymphocytes Relative: 29.7 % (ref 12.0–46.0)
Lymphs Abs: 3 10*3/uL (ref 0.7–4.0)
MCHC: 34.3 g/dL (ref 30.0–36.0)
MCV: 93.5 fl (ref 78.0–100.0)
Monocytes Absolute: 1.1 10*3/uL — ABNORMAL HIGH (ref 0.1–1.0)
Monocytes Relative: 11.2 % (ref 3.0–12.0)
Neutro Abs: 5.4 10*3/uL (ref 1.4–7.7)
Neutrophils Relative %: 53.7 % (ref 43.0–77.0)
Platelets: 279 10*3/uL (ref 150.0–400.0)
RBC: 4.41 Mil/uL (ref 4.22–5.81)
RDW: 15.4 % (ref 11.5–15.5)
WBC: 10 10*3/uL (ref 4.0–10.5)

## 2022-03-28 LAB — COMPREHENSIVE METABOLIC PANEL
ALT: 21 U/L (ref 0–53)
AST: 18 U/L (ref 0–37)
Albumin: 4.2 g/dL (ref 3.5–5.2)
Alkaline Phosphatase: 89 U/L (ref 39–117)
BUN: 19 mg/dL (ref 6–23)
CO2: 27 mEq/L (ref 19–32)
Calcium: 9.4 mg/dL (ref 8.4–10.5)
Chloride: 103 mEq/L (ref 96–112)
Creatinine, Ser: 1.16 mg/dL (ref 0.40–1.50)
GFR: 61.44 mL/min (ref >60.00)
Glucose, Bld: 87 mg/dL (ref 70–99)
Potassium: 4 mEq/L (ref 3.5–5.1)
Sodium: 140 mEq/L (ref 135–145)
Total Bilirubin: 0.6 mg/dL (ref 0.2–1.2)
Total Protein: 7.3 g/dL (ref 6.0–8.3)

## 2022-03-28 MED ORDER — MYCOPHENOLATE MOFETIL 500 MG PO TABS: 500.0000 mg | ORAL_TABLET | Freq: Two times a day (BID) | ORAL | 5 refills | Status: DC

## 2022-03-28 NOTE — Progress Notes (Signed)
Alexander Nunez    [id number]    08/21/1945  Primary Care Physician:Fusco, Purcell Nails, MD  Referring Physician: [employer], [address],  [place] [address]  Chief complaint: Follow-up for ILD, pulmonary fibrosis  HPI: 77 y.o. with history of hypertension, hyperlipidemia, sleep apnea, psoriasis  2020 Had an incidental finding of interstitial lung disease on CT abdomen which was done for diverticulitis He has seen Dr. Velvet Bathe at [facility] who did a high resolution CT with findings of alternate diagnosis, possible HP.  He had HP panel on 12/28/2018 which is negative. History notable for psoriasis with skin involvement.  He was on methotrexate for a couple of years and stopped as his skin rash improved.  Last dose of methotrexate was several years ago.  2021 Had COVID-19 infection December 2021, referred for monoclonal antibody  2023 Discussed at multidisciplinary conference on 10/15/2021 CTs show progressive interstitial lung disease and alternate pattern. Differential diagnoses include chronic HP versus NSIP.  Recommend a biopsy and would favor surgical lung biopsy as transbronchial and BAL may not give a definitive diagnosis. MDC see also recommends empiric treatment with antifibrotic's +/- anti-inflammatory if pathology is suggestive.  Pets: Has a dog.  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  His hobbies include gardening but no significant exposure to mold,No hot tub, Jacuzzi, no down pillows or comforters.  He uses talcum powder every day.  May have had Freon leak from his HVAC system. Likes to play golf.   Smoking history: 30-pack-year smoker.  Quit in 1980 Travel history: No significant travel history Relevant family history: No significant family history  of lung disease.  Interval history: Maris Berger in first week of Jan 2023.  He is tolerating it well so far with no issue Has started pulmonary rehab and is doing well States that breathing is better.  Outpatient Encounter Medications as of 03/28/2022  Medication Sig   albuterol (VENTOLIN HFA) 108 (90 Base) MCG/ACT inhaler Inhale 1-2 puffs into the lungs every 4 (four) hours as needed for shortness of breath or wheezing.   ALPRAZolam (XANAX) 0.5 MG tablet Take 0.25-0.5 mg by mouth 3 (three) times daily as needed for sleep.   Azelastine HCl 137 MCG/SPRAY SOLN PLACE 2 SPRAYS INTO BOTH NOSTRILS 2 TIMES DAILY AS NEEDED FOR RHINITIS.   doxazosin (CARDURA) 4 MG tablet Take 4 mg by mouth at bedtime.     escitalopram (LEXAPRO) 5 MG tablet Take 5 mg by mouth daily.   fluticasone (FLONASE) 50 MCG/ACT nasal spray Place 2 sprays into both nostrils daily.   losartan (COZAAR) 25 MG tablet Take 25 mg by mouth daily.   metoprolol tartrate (LOPRESSOR) 25 MG tablet Take 25 mg by mouth 2 (two) times daily.   Nintedanib (OFEV) 150 MG CAPS Take 1 capsule (150 mg total) by mouth 2 (two) times daily.   pantoprazole (PROTONIX) 40 MG tablet Take 40 mg by mouth daily.   pravastatin (PRAVACHOL) 20 MG tablet Take 20 mg by mouth every evening.    [DISCONTINUED] azithromycin (ZITHROMAX) 250 MG tablet Take as directed (Patient not taking: Reported on 02/19/2022)   [DISCONTINUED] predniSONE (DELTASONE) 20 MG tablet Take '40mg'$  for 5 days (Patient not taking: Reported on 02/19/2022)   No facility-administered encounter medications on file as of 03/28/2022.   Physical  Exam: Blood pressure (!) 142/80, pulse 61, temperature 97.9 F (36.6 C), temperature source Oral, height '5\' 11"'$  (1.803 m), weight 276 lb (125.2 kg), SpO2 97 %. Gen:      No acute distress HEENT:  EOMI, sclera anicteric Neck:     No masses; no thyromegaly Lungs:    Bibasilar crackles CV:         Regular rate and rhythm; no murmurs Abd:      + bowel sounds;  soft, non-tender; no palpable masses, no distension Ext:    No edema; adequate peripheral perfusion Skin:      Warm and dry; no rash Neuro: alert and oriented x 3 Psych: normal mood and affect   Data Reviewed: Imaging: High-resolution CT 12/24/2018-mild emphysema, traction bronchiectasis, patchy groundglass and reticulation.  More prominent in the upper lobes with no basal gradient.  Air-trapping present. High-res CT 09/19/2019-stable to minimally changed interstitial lung disease in alternate pattern  CT 03/25/2021-interstitial lung disease with superimposed groundglass opacities in the right upper and left lower lobe.  High-resolution CT 09/12/2021-upper lobe predominant fibrotic interstitial lung disease and alternate pattern. I have reviewed the images personally.  PFTs: 01/06/2019 FVC 3 [66%], FEV1 2.61 [78%], F/F 87, TLC 4.33 [59%], DLCO 22.56 [85%] Severe restriction with no obstruction.  Diffusion capacity is normal  10/03/2019 FVC 2.90 [64%], FEV1 2.47 [94%], DLCO 21.35 [81%] Moderate restriction  08/29/2020 FVC 2.14 [1%], FEV1 2.42 [74%], TLC 4.55 [62%], DLCO 19.75 [75%] Moderate restriction, mild diffusion defect  01/08/2022 FVC 2.29 [52%], FEV1 2.05 [64%], F/F89, TLC 3.70 [51%], DLCO 14.84 [57%]   Labs: Hypersensitivity panel 12/28/2018- Negative CTD profile 02/10/2019- Negative  Sleep: Home sleep study 08/03/2021 Moderate OSA with AHI 28.6, low O2 sat of 81%.  Assessment:  Interstitial lung disease CT scan reviewed with alternate pattern of pulmonary fibrosis with upper lobe predominance.  This could be hypersensitivity pneumonitis though he does not have any significant exposures  Lab work including CTD serologies, hypersensitivity panel is negative.  Has remote exposure to asbestos but CT scan is not typical of asbestosis.  His CT may show slight progression.  We had multiple discussions including today for further work-up in detail with patient, including wait and  watch with monitoring, bronchoscope with BAL, transbronchial biopsies or surgical lung biopsy.  He is very reluctant to undergo an invasive procedure. Per multidisciplinary conference recommendations for biopsy, treatment with antifibrotic's +/- anti-inflammatory.  We reviewed PFTs today which shows progressive worsening.  After long discussion with patient and family we have decided to start treatment with antifibrotic's.   He is tolerating Ofev well so far.  I will add CellCept as an add-on for anti-inflammatory effect. Check labs monthly for monitoring Continue pulmonary rehab  OSA He has diagnosis of OSA 30 years ago.  On auto CPAP. CPAP download reviewed with good compliance and response with residual AHI of 2.2 Will need new order placed to DME for CPAP  Obesity He has weight gain recently which is suspect is contributing to his restriction and dyspnea Advised weight loss with diet and exercise  Plan/Recommendations: - Check  labs for monitoring - Continue Ofev start CellCept 100 mg twice daily - CPAP order - Pulmonary rehab  Marshell Garfinkel MD [facility] 03/28/2022, 2:01 PM  CC: [employer], MD

## 2022-03-28 NOTE — Addendum Note (Signed)
Addended by: Elton Sin on: 03/28/2022 02:41 PM   Modules accepted: Orders

## 2022-03-28 NOTE — Addendum Note (Signed)
Addended by: Elton Sin on: 03/28/2022 02:24 PM   Modules accepted: Orders

## 2022-03-28 NOTE — Patient Instructions (Signed)
I am glad you are doing well with your breathing Recheck CMP and CBC today and monthly for the next 3 months for monitoring Start CellCept 500 mg twice daily Continue Ofev Will place a new order for CPAP machine with a DME  Follow-up in 3 months

## 2022-03-28 NOTE — Addendum Note (Signed)
Addended by: Elton Sin on: 03/28/2022 02:40 PM   Modules accepted: Orders

## 2022-04-01 ENCOUNTER — Encounter (HOSPITAL_COMMUNITY)
Admission: RE | Admit: 2022-04-01 | Discharge: 2022-04-01 | Disposition: A | Discharge status: Home / Self Care | Payer: Medicare HMO | Source: Ambulatory Visit | Attending: Pulmonary Disease | Admitting: Pulmonary Disease

## 2022-04-01 VITALS — Wt 276.0 lb

## 2022-04-01 DIAGNOSIS — J849 Interstitial pulmonary disease, unspecified: Secondary | ICD-10-CM

## 2022-04-01 NOTE — Progress Notes (Signed)
Daily Session Note  Patient Details  Name: TILFORD MOLLINEDO MRN: [id number] Date of Birth: April 07, 1945 Referring Provider:   Flowsheet Row PULMONARY REHAB OTHER RESP ORIENTATION from 02/20/2022 in [place]  Referring Provider Dr. Vaughan Browner       Encounter Date: 04/01/2022  Check In:  Session Check In - 04/01/22 1041       Check-In   Supervising physician immediately available to respond to emergencies [facility] MD immediately available    Physician(s) Dr Harl Bowie    Location [facility]    Staff Present Hoy Register MHA, MS, ACSM-CEP;Leana Roe, BS, Exercise Physiologist;Yatziry Deakins Hassell Done, RN, BSN    Virtual Visit No    Medication changes reported     Yes    Comments (04/01/2022)Started  on Cellcept 500 mg twice daily.    Fall or balance concerns reported    No    Tobacco Cessation No Change    Warm-up and Cool-down Performed as group-led instruction    Resistance Training Performed Yes    VAD Patient? No    PAD/SET Patient? No      Pain Assessment   Currently in Pain? Yes    Pain Score 6     Pain Location Hip    Pain Orientation Left    Pain Descriptors / Indicators Aching    Pain Type Chronic pain    Multiple Pain Sites No             Capillary Blood Glucose: No results found for this or any previous visit (from the past 24 hour(s)).    Social History   Tobacco Use  Smoking Status Former   Packs/day: 2.00   Years: 11.00   Total pack years: 22.00   Types: Cigarettes   Start date: 05/22/1962   Quit date: 05/21/1973   Years since quitting: 48.8  Smokeless Tobacco Never  Tobacco Comments   Quit 25 yrs now     Goals Met:  Proper associated with RPD/PD & O2 Sat Independence with exercise equipment Using PLB without cueing & demonstrates good technique Exercise tolerated well Queuing for purse lip breathing No report of concerns or symptoms today Strength training completed today  Goals Unmet:  Not  Applicable  Comments: Checkout at 1145.   Dr. Kathie Dike is Medical Director for [facility].

## 2022-04-02 NOTE — Progress Notes (Signed)
Pulmonary Individual Treatment Plan  Patient Details  Name: Alexander Nunez MRN: [id number] Date of Birth: [birth year] Referring Provider:   Flowsheet Row PULMONARY REHAB OTHER RESP ORIENTATION from 02/20/2022 in [place]  Referring Provider Dr. Vaughan Browner       Initial Encounter Date:  Flowsheet Row PULMONARY REHAB OTHER RESP ORIENTATION from 02/20/2022 in [place]  Date 02/20/22       Visit Diagnosis: Interstitial lung disease ([place])  Patient's Home Medications on Admission:   Current Outpatient Medications:    albuterol (VENTOLIN HFA) 108 (90 Base) MCG/ACT inhaler, Inhale 1-2 puffs into the lungs every 4 (four) hours as needed for shortness of breath or wheezing., Disp: 6.7 g, Rfl: 5   ALPRAZolam (XANAX) 0.5 MG tablet, Take 0.25-0.5 mg by mouth 3 (three) times daily as needed for sleep., Disp: , Rfl:    Azelastine HCl 137 MCG/SPRAY SOLN, PLACE 2 SPRAYS INTO BOTH NOSTRILS 2 TIMES DAILY AS NEEDED FOR RHINITIS., Disp: 90 mL, Rfl: 1   doxazosin (CARDURA) 4 MG tablet, Take 4 mg by mouth at bedtime.  , Disp: , Rfl:    escitalopram (LEXAPRO) 5 MG tablet, Take 5 mg by mouth daily., Disp: , Rfl:    fluticasone (FLONASE) 50 MCG/ACT nasal spray, Place 2 sprays into both nostrils daily., Disp: 16 g, Rfl: 11   losartan (COZAAR) 25 MG tablet, Take 25 mg by mouth daily., Disp: , Rfl:    metoprolol tartrate (LOPRESSOR) 25 MG tablet, Take 25 mg by mouth 2 (two) times daily., Disp: , Rfl:    mycophenolate (CELLCEPT) 500 MG tablet, Take 1 tablet (500 mg total) by mouth 2 (two) times daily., Disp: 60 tablet, Rfl: 5   Nintedanib (OFEV) 150 MG CAPS, Take 1 capsule (150 mg total) by mouth 2 (two) times daily., Disp: 90 capsule, Rfl: 0   pantoprazole (PROTONIX) 40 MG tablet, Take 40 mg by mouth daily., Disp: , Rfl:    pravastatin (PRAVACHOL) 20 MG tablet, Take 20 mg by mouth every evening. , Disp: , Rfl:   Past Medical History: Past Medical History:   Diagnosis Date   Anxiety    Arthritis    back   Back pain, chronic    Colon polyp    Diverticulitis    GERD (gastroesophageal reflux disease)    Heart palpitations    High cholesterol    Hypertension    Irritable bowel syndrome    Osteoporosis    Psoriasis    Pulmonary fibrosis (HCC)    Sleep apnea    c- pap    Tobacco Use: Social History   Tobacco Use  Smoking Status Former   Packs/day: 2.00   Years: 11.00   Total pack years: 22.00   Types: Cigarettes   Start date: 05/22/1962   Quit date: 05/21/1973   Years since quitting: 48.8  Smokeless Tobacco Never  Tobacco Comments   Quit 25 yrs now     Labs: Review Flowsheet       Latest Ref Rng & Units 03/26/2021  Labs for ITP Cardiac and Pulmonary Rehab  Cholestrol 0 - 200 mg/dL 140   LDL (calc) 0 - 99 mg/dL 83   HDL-C >40 mg/dL 43   Trlycerides <150 mg/dL 71     Capillary Blood Glucose: No results found for: "GLUCAP"   Pulmonary Assessment Scores:  Pulmonary Assessment Scores     Row Name 02/20/22 1253         ADL UCSD   ADL Phase  Entry     SOB Score total 45     Rest 0     Walk 2     Stairs 3     Bath 1     Dress 2     Shop 2       CAT Score   CAT Score 16       mMRC Score   mMRC Score 1             UCSD: Self-administered rating of dyspnea associated with activities of daily living (ADLs) 6-point scale (0 = "not at all" to 5 = "maximal or unable to do because of breathlessness")  Scoring Scores range from 0 to 120.  Minimally important difference is 5 units  CAT: CAT can identify the health impairment of COPD patients and is better correlated with disease progression.  CAT has a scoring range of zero to 40. The CAT score is classified into four groups of low (less than 10), medium (10 - 20), high (21-30) and very high (31-40) based on the impact level of disease on health status. A CAT score over 10 suggests significant symptoms.  A worsening CAT score could be explained by an  exacerbation, poor medication adherence, poor inhaler technique, or progression of COPD or comorbid conditions.  CAT MCID is 2 points  mMRC: mMRC (Modified Medical Research Council) Dyspnea Scale is used to assess the degree of baseline functional disability in patients of respiratory disease due to dyspnea. No minimal important difference is established. A decrease in score of 1 point or greater is considered a positive change.   Pulmonary Function Assessment:   Exercise Target Goals: Exercise Program Goal: Individual exercise prescription set using results from initial 6 min walk test and THRR while considering  patient's activity barriers and safety.   Exercise Prescription Goal: Initial exercise prescription builds to 30-45 minutes a day of aerobic activity, 2-3 days per week.  Home exercise guidelines will be given to patient during program as part of exercise prescription that the participant will acknowledge.  Activity Barriers & Risk Stratification:  Activity Barriers & Cardiac Risk Stratification - 02/20/22 1313       Activity Barriers & Cardiac Risk Stratification   Activity Barriers Joint Problems    Cardiac Risk Stratification High             6 Minute Walk:  6 Minute Walk     Row Name 02/20/22 1317         6 Minute Walk   Phase Initial     Distance 1350 feet     Walk Time 6 minutes     # of Rest Breaks 0     MPH 2.55     METS 2.41     RPE 13     Perceived Dyspnea  12     VO2 Peak 8.46     Symptoms No     Resting HR 73 bpm     Resting BP 128/68     Resting Oxygen Saturation  96 %     Exercise Oxygen Saturation  during 6 min walk 91 %     Max Ex. HR 116 bpm     Max Ex. BP 160/70     2 Minute Post BP 130/68       Interval HR   1 Minute HR 73     2 Minute HR 73     3 Minute HR 73     4 Minute HR  80     5 Minute HR 116     6 Minute HR 116     2 Minute Post HR 76     Interval Heart Rate? Yes       Interval Oxygen   Interval Oxygen? Yes      Baseline Oxygen Saturation % 96 %     1 Minute Oxygen Saturation % 95 %     1 Minute Liters of Oxygen 0 L     2 Minute Oxygen Saturation % 92 %     2 Minute Liters of Oxygen 0 L     3 Minute Oxygen Saturation % 91 %     3 Minute Liters of Oxygen 0 L     4 Minute Oxygen Saturation % 93 %     4 Minute Liters of Oxygen 0 L     5 Minute Oxygen Saturation % 93 %     5 Minute Liters of Oxygen 0 L     6 Minute Oxygen Saturation % 91 %     6 Minute Liters of Oxygen 0 L     2 Minute Post Oxygen Saturation % 96 %     2 Minute Post Liters of Oxygen 0 L              Oxygen Initial Assessment:  Oxygen Initial Assessment - 02/20/22 1252       Home Oxygen   Home Oxygen Device None    Sleep Oxygen Prescription CPAP    Liters per minute 0    Home Exercise Oxygen Prescription None    Home Resting Oxygen Prescription None    Compliance with Home Oxygen Use Yes      Initial 6 min Walk   Oxygen Used None      Program Oxygen Prescription   Program Oxygen Prescription None      Intervention   Short Term Goals To learn and understand importance of monitoring SPO2 with pulse oximeter and demonstrate accurate use of the pulse oximeter.;To learn and understand importance of maintaining oxygen saturations>88%;To learn and demonstrate proper pursed lip breathing techniques or other breathing techniques.     Long  Term Goals Verbalizes importance of monitoring SPO2 with pulse oximeter and return demonstration;Maintenance of O2 saturations>88%;Exhibits proper breathing techniques, such as pursed lip breathing or other method taught during program session;Compliance with respiratory medication             Oxygen Re-Evaluation:  Oxygen Re-Evaluation     Row Name 04/01/22 1310             Program Oxygen Prescription   Program Oxygen Prescription None         Home Oxygen   Home Oxygen Device None       Sleep Oxygen Prescription CPAP       Liters per minute 0       Home Exercise Oxygen  Prescription None       Home Resting Oxygen Prescription None       Compliance with Home Oxygen Use Yes         Goals/Expected Outcomes   Short Term Goals To learn and understand importance of monitoring SPO2 with pulse oximeter and demonstrate accurate use of the pulse oximeter.;To learn and understand importance of maintaining oxygen saturations>88%;To learn and demonstrate proper pursed lip breathing techniques or other breathing techniques.        Long  Term Goals Verbalizes importance of monitoring SPO2 with pulse oximeter and return  demonstration;Maintenance of O2 saturations>88%;Exhibits proper breathing techniques, such as pursed lip breathing or other method taught during program session;Compliance with respiratory medication       Goals/Expected Outcomes compliant                Oxygen Discharge (Final Oxygen Re-Evaluation):  Oxygen Re-Evaluation - 04/01/22 1310       Program Oxygen Prescription   Program Oxygen Prescription None      Home Oxygen   Home Oxygen Device None    Sleep Oxygen Prescription CPAP    Liters per minute 0    Home Exercise Oxygen Prescription None    Home Resting Oxygen Prescription None    Compliance with Home Oxygen Use Yes      Goals/Expected Outcomes   Short Term Goals To learn and understand importance of monitoring SPO2 with pulse oximeter and demonstrate accurate use of the pulse oximeter.;To learn and understand importance of maintaining oxygen saturations>88%;To learn and demonstrate proper pursed lip breathing techniques or other breathing techniques.     Long  Term Goals Verbalizes importance of monitoring SPO2 with pulse oximeter and return demonstration;Maintenance of O2 saturations>88%;Exhibits proper breathing techniques, such as pursed lip breathing or other method taught during program session;Compliance with respiratory medication    Goals/Expected Outcomes compliant             Initial Exercise Prescription:  Initial  Exercise Prescription - 02/20/22 1300       Date of Initial Exercise RX and Referring Provider   Date 02/20/22    Referring Provider Dr. Vaughan Browner    Expected Discharge Date 06/24/22      Treadmill   MPH 1    Grade 0    Minutes 17      NuStep   Level 1    SPM 60    Minutes 22      Prescription Details   Frequency (times per week) 2    Duration Progress to 30 minutes of continuous aerobic without signs/symptoms of physical distress      Intensity   THRR 40-80% of Max Heartrate 58-116    Ratings of Perceived Exertion 11-13    Perceived Dyspnea 0-4      Resistance Training   Training Prescription Yes    Weight 4    Reps 10-15             Perform Capillary Blood Glucose checks as needed.  Exercise Prescription Changes:   Exercise Prescription Changes     Row Name 03/04/22 1100 03/13/22 1200 03/25/22 1200 04/01/22 1300       Response to Exercise   Blood Pressure (Admit) 130/70 148/68 -- 126/78    Blood Pressure (Exercise) 142/80 148/70 -- 148/78    Blood Pressure (Exit) 136/72 130/74 -- 140/70    Heart Rate (Admit) 60 bpm 69 bpm -- 64 bpm    Heart Rate (Exercise) 77 bpm 67 bpm -- 76 bpm    Heart Rate (Exit) 67 bpm 62 bpm -- 69 bpm    Oxygen Saturation (Admit) 97 % 91 % -- 95 %    Oxygen Saturation (Exercise) 96 % 95 % -- 94 %    Oxygen Saturation (Exit) 96 % 95 % -- 96 %    Rating of Perceived Exertion (Exercise) 12 12 -- 12    Perceived Dyspnea (Exercise) 12 12 -- 12    Duration Continue with 30 min of aerobic exercise without signs/symptoms of physical distress. Continue with 30 min of aerobic exercise without  signs/symptoms of physical distress. -- Continue with 30 min of aerobic exercise without signs/symptoms of physical distress.    Intensity THRR unchanged THRR unchanged -- THRR unchanged      Progression   Progression Continue to progress workloads to maintain intensity without signs/symptoms of physical distress. Continue to progress workloads to  maintain intensity without signs/symptoms of physical distress. -- Continue to progress workloads to maintain intensity without signs/symptoms of physical distress.      Resistance Training   Training Prescription Yes Yes -- Yes    Weight 5 5 -- 5    Reps 10-15 10-15 -- 10-15    Time 10 Minutes 10 Minutes -- 10 Minutes      NuStep   Level 2 2 -- 2    SPM 109 110 -- 106    Minutes 39 39 -- 39    METs 2.1 2.1 -- 2      Home Exercise Plan   Plans to continue exercise at -- -- Home (comment) --    Frequency -- -- Add 3 additional days to program exercise sessions. --    Initial Home Exercises Provided -- -- 03/25/22 --             Exercise Comments:   Exercise Comments     Row Name 03/25/22 1253           Exercise Comments home exercise reviewed                Exercise Goals and Review:   Exercise Goals     Row Name 02/20/22 1319 03/04/22 1149 04/01/22 1305         Exercise Goals   Increase Physical Activity Yes Yes Yes     Intervention Provide advice, education, support and counseling about physical activity/exercise needs.;Develop an individualized exercise prescription for aerobic and resistive training based on initial evaluation findings, risk stratification, comorbidities and participant's personal goals. Provide advice, education, support and counseling about physical activity/exercise needs.;Develop an individualized exercise prescription for aerobic and resistive training based on initial evaluation findings, risk stratification, comorbidities and participant's personal goals. Provide advice, education, support and counseling about physical activity/exercise needs.;Develop an individualized exercise prescription for aerobic and resistive training based on initial evaluation findings, risk stratification, comorbidities and participant's personal goals.     Expected Outcomes Short Term: Attend rehab on a regular basis to increase amount of physical  activity.;Long Term: Add in home exercise to make exercise part of routine and to increase amount of physical activity.;Long Term: Exercising regularly at least 3-5 days a week. Short Term: Attend rehab on a regular basis to increase amount of physical activity.;Long Term: Add in home exercise to make exercise part of routine and to increase amount of physical activity.;Long Term: Exercising regularly at least 3-5 days a week. Short Term: Attend rehab on a regular basis to increase amount of physical activity.;Long Term: Add in home exercise to make exercise part of routine and to increase amount of physical activity.;Long Term: Exercising regularly at least 3-5 days a week.     Increase Strength and Stamina Yes Yes Yes     Intervention Provide advice, education, support and counseling about physical activity/exercise needs.;Develop an individualized exercise prescription for aerobic and resistive training based on initial evaluation findings, risk stratification, comorbidities and participant's personal goals. Provide advice, education, support and counseling about physical activity/exercise needs.;Develop an individualized exercise prescription for aerobic and resistive training based on initial evaluation findings, risk stratification, comorbidities and participant's personal goals.  Provide advice, education, support and counseling about physical activity/exercise needs.;Develop an individualized exercise prescription for aerobic and resistive training based on initial evaluation findings, risk stratification, comorbidities and participant's personal goals.     Expected Outcomes Short Term: Increase workloads from initial exercise prescription for resistance, speed, and METs.;Short Term: Perform resistance training exercises routinely during rehab and add in resistance training at home;Long Term: Improve cardiorespiratory fitness, muscular endurance and strength as measured by increased METs and functional  capacity (6MWT) Short Term: Increase workloads from initial exercise prescription for resistance, speed, and METs.;Short Term: Perform resistance training exercises routinely during rehab and add in resistance training at home;Long Term: Improve cardiorespiratory fitness, muscular endurance and strength as measured by increased METs and functional capacity (6MWT) Short Term: Increase workloads from initial exercise prescription for resistance, speed, and METs.;Short Term: Perform resistance training exercises routinely during rehab and add in resistance training at home;Long Term: Improve cardiorespiratory fitness, muscular endurance and strength as measured by increased METs and functional capacity (6MWT)     Able to understand and use rate of perceived exertion (RPE) scale Yes Yes Yes     Intervention Provide education and explanation on how to use RPE scale Provide education and explanation on how to use RPE scale Provide education and explanation on how to use RPE scale     Expected Outcomes Short Term: Able to use RPE daily in rehab to express subjective intensity level;Long Term:  Able to use RPE to guide intensity level when exercising independently Short Term: Able to use RPE daily in rehab to express subjective intensity level;Long Term:  Able to use RPE to guide intensity level when exercising independently Short Term: Able to use RPE daily in rehab to express subjective intensity level;Long Term:  Able to use RPE to guide intensity level when exercising independently     Able to understand and use Dyspnea scale Yes Yes Yes     Intervention Provide education and explanation on how to use Dyspnea scale Provide education and explanation on how to use Dyspnea scale Provide education and explanation on how to use Dyspnea scale     Expected Outcomes Short Term: Able to use Dyspnea scale daily in rehab to express subjective sense of shortness of breath during exertion;Long Term: Able to use Dyspnea scale  to guide intensity level when exercising independently Short Term: Able to use Dyspnea scale daily in rehab to express subjective sense of shortness of breath during exertion;Long Term: Able to use Dyspnea scale to guide intensity level when exercising independently Short Term: Able to use Dyspnea scale daily in rehab to express subjective sense of shortness of breath during exertion;Long Term: Able to use Dyspnea scale to guide intensity level when exercising independently     Knowledge and understanding of Target Heart Rate Range (THRR) Yes Yes Yes     Intervention Provide education and explanation of THRR including how the numbers were predicted and where they are located for reference Provide education and explanation of THRR including how the numbers were predicted and where they are located for reference Provide education and explanation of THRR including how the numbers were predicted and where they are located for reference     Expected Outcomes Short Term: Able to state/look up THRR;Short Term: Able to use daily as guideline for intensity in rehab;Long Term: Able to use THRR to govern intensity when exercising independently Short Term: Able to state/look up THRR;Short Term: Able to use daily as guideline for intensity in rehab;Long Term:  Able to use THRR to govern intensity when exercising independently Short Term: Able to state/look up THRR;Short Term: Able to use daily as guideline for intensity in rehab;Long Term: Able to use THRR to govern intensity when exercising independently     Understanding of Exercise Prescription Yes Yes Yes     Intervention Provide education, explanation, and written materials on patient's individual exercise prescription Provide education, explanation, and written materials on patient's individual exercise prescription Provide education, explanation, and written materials on patient's individual exercise prescription     Expected Outcomes Short Term: Able to explain  program exercise prescription;Long Term: Able to explain home exercise prescription to exercise independently Short Term: Able to explain program exercise prescription;Long Term: Able to explain home exercise prescription to exercise independently Short Term: Able to explain program exercise prescription;Long Term: Able to explain home exercise prescription to exercise independently              Exercise Goals Re-Evaluation :  Exercise Goals Re-Evaluation     [place] Name 03/04/22 1150 04/01/22 1305           Exercise Goal Re-Evaluation   Exercise Goals Review Increase Physical Activity;Increase Strength and Stamina;Able to understand and use rate of perceived exertion (RPE) scale;Able to understand and use Dyspnea scale;Knowledge and understanding of Target Heart Rate Range (THRR);Understanding of Exercise Prescription Increase Physical Activity;Increase Strength and Stamina;Able to understand and use rate of perceived exertion (RPE) scale;Able to understand and use Dyspnea scale;Knowledge and understanding of Target Heart Rate Range (THRR);Understanding of Exercise Prescription      Comments Pt has completed 3 sessions of PR. He has L hip bursitis that bothers him when exercising. He was not able to use the treadmill due to the impact on his hip causing the bursitis pain to worsen. He is currently using the stepper both times and stated that his hip does not bother him when exercising. He has increased his  workload already and is currently exercising at 2.1 METs on the stepper. Will continue to monitor and progress as able. Pt has completed 9 sessions of PR. He is tolerating exercise well on the stepper. He is still not able to walk on the treadmill due to his L hip bursitis. He recived a shot earlier this month and stated that it only helped his hip a little. He is currently exercising at 2.1 METs on the stepper. Will continue to monitor and progress as able.      Expected Outcomes Through  exercise at rehab and home, the patient will meet their stated goals. Through exercise at rehab and home, the patient will meet their stated goals.               Discharge Exercise Prescription (Final Exercise Prescription Changes):  Exercise Prescription Changes - 04/01/22 1300       Response to Exercise   Blood Pressure (Admit) 126/78    Blood Pressure (Exercise) 148/78    Blood Pressure (Exit) 140/70    Heart Rate (Admit) 64 bpm    Heart Rate (Exercise) 76 bpm    Heart Rate (Exit) 69 bpm    Oxygen Saturation (Admit) 95 %    Oxygen Saturation (Exercise) 94 %    Oxygen Saturation (Exit) 96 %    Rating of Perceived Exertion (Exercise) 12    Perceived Dyspnea (Exercise) 12    Duration Continue with 30 min of aerobic exercise without signs/symptoms of physical distress.    Intensity THRR unchanged  Progression   Progression Continue to progress workloads to maintain intensity without signs/symptoms of physical distress.      Resistance Training   Training Prescription Yes    Weight 5    Reps 10-15    Time 10 Minutes      NuStep   Level 2    SPM 106    Minutes 39    METs 2             Nutrition:  Target Goals: Understanding of nutrition guidelines, daily intake of sodium '1500mg'$ , cholesterol '200mg'$ , calories 30% from fat and 7% or less from saturated fats, daily to have 5 or more servings of fruits and vegetables.  Biometrics:  Pre Biometrics - 02/20/22 1320       Pre Biometrics   Height '5\' 10"'$  (1.778 m)    Weight 126.6 kg    Waist Circumference 48.5 inches    Hip Circumference 50 inches    Waist to Hip Ratio 0.97 %    BMI (Calculated) 40.05    Triceps Skinfold 20 mm    % Body Fat 37.1 %    Grip Strength 44.3 kg    Flexibility 0 in    Single Leg Stand 6.68 seconds              Nutrition Therapy Plan and Nutrition Goals:  Nutrition Therapy & Goals - 02/26/22 1346       Personal Nutrition Goals   Comments Patient's diet assessment score  was 50. We offer 2 educational sessions on heart heatlhy nutrition with handouts and assistance with RD referral if patient is interested.      Intervention Plan   Expected Outcomes Short Term Goal: Understand basic principles of dietary content, such as calories, fat, sodium, cholesterol and nutrients.             Nutrition Assessments:  Nutrition Assessments - 02/20/22 1256       MEDFICTS Scores   Pre Score 50            MEDIFICTS Score Key: ?70 Need to make dietary changes  40-70 Heart Healthy Diet ? 40 Therapeutic Level Cholesterol Diet   Picture Your Plate Scores: [id number] Unhealthy dietary pattern with much room for improvement. 41-50 Dietary pattern unlikely to meet recommendations for good health and room for improvement. 51-60 More healthful dietary pattern, with some room for improvement.  >60 Healthy dietary pattern, although there may be some specific behaviors that could be improved.    Nutrition Goals Re-Evaluation:   Nutrition Goals Discharge (Final Nutrition Goals Re-Evaluation):   Psychosocial: Target Goals: Acknowledge presence or absence of significant depression and/or stress, maximize coping skills, provide positive support system. Participant is able to verbalize types and ability to use techniques and skills needed for reducing stress and depression.  Initial Review & Psychosocial Screening:  Initial Psych Review & Screening - 02/20/22 1314       Initial Review   Current issues with Current Stress Concerns;Current Sleep Concerns;Current Anxiety/Panic    Source of Stress Concerns Chronic Illness    Comments His ILD is progressing, so this does stress him out and cause some anxiety      Family Dynamics   Good Support System? Yes    Comments His support system includes his wife and his two sons.      Barriers   Psychosocial barriers to participate in program There are no identifiable barriers or psychosocial needs.      Screening  Interventions  Interventions Encouraged to exercise    Expected Outcomes Short Term goal: Identification and review with participant of any Quality of Life or Depression concerns found by scoring the questionnaire.;Long Term goal: The participant improves quality of Life and PHQ9 Scores as seen by post scores and/or verbalization of changes;Long Term Goal: Stressors or current issues are controlled or eliminated.             Quality of Life Scores:  Quality of Life - 02/20/22 1320       Quality of Life   Select Quality of Life      Quality of Life Scores   Health/Function Pre 20.16 %    Socioeconomic Pre 23.57 %    Psych/Spiritual Pre 22.43 %    Family Pre 28.8 %    GLOBAL Pre 22.53 %            Scores of 19 and below usually indicate a poorer quality of life in these areas.  A difference of  2-3 points is a clinically meaningful difference.  A difference of 2-3 points in the total score of the Quality of Life Index has been associated with significant improvement in overall quality of life, self-image, physical symptoms, and general health in studies assessing change in quality of life.   PHQ-9: Review Flowsheet       02/20/2022  Depression screen PHQ 2/9  Decreased Interest 0  Down, Depressed, Hopeless 0  PHQ - 2 Score 0  Altered sleeping 0  Tired, decreased energy 1  Change in appetite 0  Feeling bad or failure about yourself  0  Trouble concentrating 0  Moving slowly or fidgety/restless 0  Suicidal thoughts 0  PHQ-9 Score 1  Difficult doing work/chores Not difficult at all   Interpretation of Total Score  Total Score Depression Severity:  1-4 = Minimal depression, 5-9 = Mild depression, 10-14 = Moderate depression, 15-19 = Moderately severe depression, 20-27 = Severe depression   Psychosocial Evaluation and Intervention:  Psychosocial Evaluation - 02/20/22 1339       Psychosocial Evaluation & Interventions   Interventions Encouraged to exercise with the  program and follow exercise prescription    Comments Pt has no barriers to participate in PR. He does anxiety, sleep, and stress, but all of which are well controlled. He has no other identifiable psychosocial issues. He takes xanax for his sleep. He is prescribed 0.5 mg tid, but he reports that he only takes half of a tablet a few nights per week. His anxiety and stress stem from his ILD. He was told at his last visit with Dr. Vaughan Browner that his ILD is progressing, so this does cause him stress and anxiety. Dr. Gerarda Fraction, his PCP, prescribed him Lexapro for the anxiety, but he reports that he is not taking it. He has been taking ofev, an antifibrotic medication, for his ILD, and this is giving him hope for his future. He is also mentions how lucky he is to not be experiencing any side effects from ofev. He scored a 1 on his PHQ-9, and this is due to his lack of energy. He reports that he has a good support system from his wife and two sons. His goals while in the program are to decrease his SOB with exertion and to lose weight. He is eager to start the program.    Expected Outcomes Pt's stress, anxiety, and sleep will continue to be managed, and he will have no other identifiable psychosocial issues.    Continue  Psychosocial Services  No Follow up required             Psychosocial Re-Evaluation:  Psychosocial Re-Evaluation     [place] Name 02/26/22 1338 03/27/22 0748           Psychosocial Re-Evaluation   Current issues with Current Anxiety/Panic;Current Stress Concerns;Current Sleep Concerns Current Anxiety/Panic;Current Stress Concerns;Current Sleep Concerns      Comments Patient is new to the program. He has completed 1 session. His sleep and anxiety continues to be managed with xanax. He continues to have no psychosocial barriers identified. We will continue monitor his progress. Patient has completed  7 session. His sleep and anxiety continues to be managed with Alprazolam. He seems to enjoy the  sessions and demonstrates an interest in improving his health. He continues to have no psychosocial barriers identified. We will continue monitor his progress.      Expected Outcomes Patient will continue to have no psychosocial barriers identified. Patient will continue to have no psychosocial barriers identified.      Interventions Relaxation education;Stress management education;Encouraged to attend Pulmonary Rehabilitation for the exercise Relaxation education;Stress management education;Encouraged to attend Pulmonary Rehabilitation for the exercise      Continue Psychosocial Services  No Follow up required No Follow up required        Initial Review   Source of Stress Concerns Chronic Illness Chronic Illness      Comments His ILD is progressing, so this does stress him out and cause some anxiety His ILD is progressing, so this does stress him out and cause some anxiety               Psychosocial Discharge (Final Psychosocial Re-Evaluation):  Psychosocial Re-Evaluation - 03/27/22 0748       Psychosocial Re-Evaluation   Current issues with Current Anxiety/Panic;Current Stress Concerns;Current Sleep Concerns    Comments Patient has completed  7 session. His sleep and anxiety continues to be managed with Alprazolam. He seems to enjoy the sessions and demonstrates an interest in improving his health. He continues to have no psychosocial barriers identified. We will continue monitor his progress.    Expected Outcomes Patient will continue to have no psychosocial barriers identified.    Interventions Relaxation education;Stress management education;Encouraged to attend Pulmonary Rehabilitation for the exercise    Continue Psychosocial Services  No Follow up required      Initial Review   Source of Stress Concerns Chronic Illness    Comments His ILD is progressing, so this does stress him out and cause some anxiety              Education: Education Goals: Education classes will be  provided on a weekly basis, covering required topics. Participant will state understanding/return demonstration of topics presented.  Learning Barriers/Preferences:  Learning Barriers/Preferences - 02/20/22 1319       Learning Barriers/Preferences   Learning Barriers None    Learning Preferences Written Material             Education Topics: How Lungs Work and Diseases: - Discuss the anatomy of the lungs and diseases that can affect the lungs, such as COPD.   Exercise: -Discuss the importance of exercise, FITT principles of exercise, normal and abnormal responses to exercise, and how to exercise safely.   Environmental Irritants: -Discuss types of environmental irritants and how to limit exposure to environmental irritants.   Meds/Inhalers and oxygen: - Discuss respiratory medications, definition of an inhaler and oxygen, and the proper way to use  an inhaler and oxygen.   Energy Saving Techniques: - Discuss methods to conserve energy and decrease shortness of breath when performing activities of daily living.  Flowsheet Row PULMONARY REHAB OTHER RESPIRATORY from 03/27/2022 in [place]  Date 02/27/22  Educator HB  Instruction Review Code 1- Verbalizes Understanding       Bronchial Hygiene / Breathing Techniques: - Discuss breathing mechanics, pursed-lip breathing technique,  proper posture, effective ways to clear airways, and other functional breathing techniques   Cleaning Equipment: - Provides group verbal and written instruction about the health risks of elevated stress, cause of high stress, and healthy ways to reduce stress. Flowsheet Row PULMONARY REHAB OTHER RESPIRATORY from 03/27/2022 in [place]  Date 03/13/22  Educator HB  Instruction Review Code 1- Verbalizes Understanding       Nutrition I: Fats: - Discuss the types of cholesterol, what cholesterol does to the body, and how cholesterol levels can be  controlled. Flowsheet Row PULMONARY REHAB OTHER RESPIRATORY from 03/27/2022 in [place]  Date 03/20/22  Educator DF  Instruction Review Code 1- Verbalizes Understanding       Nutrition II: Labels: -Discuss the different components of food labels and how to read food labels. Flowsheet Row PULMONARY REHAB OTHER RESPIRATORY from 03/27/2022 in [place]  Date 03/27/22  Educator DF  Instruction Review Code 2- Demonstrated Understanding       Respiratory Infections: - Discuss the signs and symptoms of respiratory infections, ways to prevent respiratory infections, and the importance of seeking medical treatment when having a respiratory infection.   Stress I: Signs and Symptoms: - Discuss the causes of stress, how stress may lead to anxiety and depression, and ways to limit stress.   Stress II: Relaxation: -Discuss relaxation techniques to limit stress.   Oxygen for Home/Travel: - Discuss how to prepare for travel when on oxygen and proper ways to transport and store oxygen to ensure safety.   Knowledge Questionnaire Score:  Knowledge Questionnaire Score - 02/20/22 1256       Knowledge Questionnaire Score   Pre Score 17/18             Core Components/Risk Factors/Patient Goals at Admission:  Personal Goals and Risk Factors at Admission - 02/20/22 1322       Core Components/Risk Factors/Patient Goals on Admission    Weight Management Yes;Obesity;Weight Loss    Intervention Obesity: Provide education and appropriate resources to help participant work on and attain dietary goals.;Weight Management/Obesity: Establish reasonable short term and long term weight goals.;Weight Management: Provide education and appropriate resources to help participant work on and attain dietary goals.;Weight Management: Develop a combined nutrition and exercise program designed to reach desired caloric intake, while maintaining appropriate intake  of nutrient and fiber, sodium and fats, and appropriate energy expenditure required for the weight goal.    Expected Outcomes Short Term: Continue to assess and modify interventions until short term weight is achieved;Long Term: Adherence to nutrition and physical activity/exercise program aimed toward attainment of established weight goal;Weight Maintenance: Understanding of the daily nutrition guidelines, which includes 25-35% calories from fat, 7% or less cal from saturated fats, less than '200mg'$  cholesterol, less than 1.5gm of sodium, & 5 or more servings of fruits and vegetables daily;Weight Loss: Understanding of general recommendations for a balanced deficit meal plan, which promotes 1-2 lb weight loss per week and includes a negative energy balance of [phone] kcal/d;Understanding recommendations for meals to include 15-35% energy as  protein, 25-35% energy from fat, 35-60% energy from carbohydrates, less than '200mg'$  of dietary cholesterol, 20-35 gm of total fiber daily;Understanding of distribution of calorie intake throughout the day with the consumption of 4-5 meals/snacks    Improve shortness of breath with ADL's Yes    Intervention Provide education, individualized exercise plan and daily activity instruction to help decrease symptoms of SOB with activities of daily living.    Expected Outcomes Short Term: Improve cardiorespiratory fitness to achieve a reduction of symptoms when performing ADLs;Long Term: Be able to perform more ADLs without symptoms or delay the onset of symptoms             Core Components/Risk Factors/Patient Goals Review:   Goals and Risk Factor Review     Row Name 02/26/22 1343 03/27/22 0750           Core Components/Risk Factors/Patient Goals Review   Personal Goals Review Weight Management/Obesity;Other;Improve shortness of breath with ADL's Weight Management/Obesity;Other;Improve shortness of breath with ADL's      Review Patient was referred to PR with ILD.  He has completed 1 session. His weight is 126.6 KG. He exercises on RA with O2 sats at 92-95%. His persoanl goals are to decrease his SOB with exertion and lose weight. We will continue to monitor his progress as he works towards meeting these goal. Patient has completed 7 session. His weight is 123.4 KG down 3.2 KG from last 30 day review. He continues to exercise on RA with O2 sats at 95-96% which has improved. His persoanl goals for the program are to decrease his SOB with exertion and lose weight. We will continue to monitor his progress as he works towards meeting these goal.      Expected Outcomes Patient will complete the program meeting both perosnal and program goals. Patient will complete the program meeting both perosnal and program goals.               Core Components/Risk Factors/Patient Goals at Discharge (Final Review):   Goals and Risk Factor Review - 03/27/22 0750       Core Components/Risk Factors/Patient Goals Review   Personal Goals Review Weight Management/Obesity;Other;Improve shortness of breath with ADL's    Review Patient has completed 7 session. His weight is 123.4 KG down 3.2 KG from last 30 day review. He continues to exercise on RA with O2 sats at 95-96% which has improved. His persoanl goals for the program are to decrease his SOB with exertion and lose weight. We will continue to monitor his progress as he works towards meeting these goal.    Expected Outcomes Patient will complete the program meeting both perosnal and program goals.             ITP Comments:   Comments: ITP REVIEW Pt is making expected progress toward pulmonary rehab goals after completing 10 sessions. Recommend continued exercise, life style modification, education, and utilization of breathing techniques to increase stamina and strength and decrease shortness of breath with exertion.

## 2022-04-03 ENCOUNTER — Encounter (HOSPITAL_COMMUNITY): Admission: RE | Admit: 2022-04-03 | Payer: Medicare HMO | Source: Ambulatory Visit

## 2022-04-07 ENCOUNTER — Other Ambulatory Visit (HOSPITAL_COMMUNITY): Payer: Self-pay

## 2022-04-07 ENCOUNTER — Telehealth: Payer: Self-pay

## 2022-04-07 NOTE — Telephone Encounter (Signed)
Received a fax from  [facility] regarding an approval for [facility] patient assistance from 04/07/2022 to 02/03/2023. Approval letter sent to scan center.  *note--pt's re-enrollment was initially denied at 4:04pm due to exceeding income threshold, however we received a follow up fax at 4:15pm saying that he had been approved.

## 2022-04-07 NOTE — Telephone Encounter (Signed)
Submitted Patient Assistance Application to [employer] for OFEV along with provider portion, PA and income documents. Will update patient when we receive a response.  Fax# [phone] Phone# [phone] 

## 2022-04-08 ENCOUNTER — Encounter (HOSPITAL_COMMUNITY)
Admission: RE | Admit: 2022-04-08 | Discharge: 2022-04-08 | Disposition: A | Discharge status: Home / Self Care | Payer: Medicare HMO | Source: Ambulatory Visit | Attending: Pulmonary Disease | Admitting: Pulmonary Disease

## 2022-04-08 DIAGNOSIS — J849 Interstitial pulmonary disease, unspecified: Secondary | ICD-10-CM | POA: Diagnosis not present

## 2022-04-08 NOTE — Progress Notes (Signed)
Daily Session Note  Patient Details  Name: TYNE FINERTY MRN: [id number] Date of Birth: 10-28-1945 Referring Provider:   Flowsheet Row PULMONARY REHAB OTHER RESP ORIENTATION from 02/20/2022 in [place]  Referring Provider Dr. Vaughan Browner       Encounter Date: 04/08/2022  Check In:  Session Check In - 04/08/22 1040       Check-In   Supervising physician immediately available to respond to emergencies [facility] MD immediately available    Physician(s) Dr Harrington Challenger    Location [facility]    Staff Present Hoy Register MHA, MS, ACSM-CEP;Leana Roe, BS, Exercise Physiologist;Manfred Laspina Hassell Done, RN, BSN    Virtual Visit No    Medication changes reported     Yes    Comments (04/01/2022)Started  on Cellcept 500 mg twice daily.    Fall or balance concerns reported    No    Tobacco Cessation No Change    Warm-up and Cool-down Performed as group-led instruction    Resistance Training Performed Yes    VAD Patient? No    PAD/SET Patient? No      Pain Assessment   Currently in Pain? No/denies    Pain Score 0-No pain    Multiple Pain Sites No             Capillary Blood Glucose: No results found for this or any previous visit (from the past 24 hour(s)).    Social History   Tobacco Use  Smoking Status Former   Packs/day: 2.00   Years: 11.00   Total pack years: 22.00   Types: Cigarettes   Start date: 05/22/1962   Quit date: 05/21/1973   Years since quitting: 48.9  Smokeless Tobacco Never  Tobacco Comments   Quit 25 yrs now     Goals Met:  Proper associated with RPD/PD & O2 Sat Independence with exercise equipment Using PLB without cueing & demonstrates good technique Exercise tolerated well Queuing for purse lip breathing No report of concerns or symptoms today Strength training completed today  Goals Unmet:  Not Applicable  Comments: Checkout at 1145.   Dr. Kathie Dike is Medical Director for [facility] Pulmonary Rehab.

## 2022-04-10 ENCOUNTER — Encounter (HOSPITAL_COMMUNITY)
Admission: RE | Admit: 2022-04-10 | Discharge: 2022-04-10 | Disposition: A | Discharge status: Home / Self Care | Payer: Medicare HMO | Source: Ambulatory Visit | Attending: Pulmonary Disease | Admitting: Pulmonary Disease

## 2022-04-10 DIAGNOSIS — B372 Candidiasis of skin and nail: Secondary | ICD-10-CM | POA: Diagnosis not present

## 2022-04-10 DIAGNOSIS — E6609 Other obesity due to excess calories: Secondary | ICD-10-CM | POA: Diagnosis not present

## 2022-04-10 DIAGNOSIS — Z6839 Body mass index (BMI) 39.0-39.9, adult: Secondary | ICD-10-CM | POA: Diagnosis not present

## 2022-04-10 DIAGNOSIS — J849 Interstitial pulmonary disease, unspecified: Secondary | ICD-10-CM | POA: Diagnosis not present

## 2022-04-10 DIAGNOSIS — M1991 Primary osteoarthritis, unspecified site: Secondary | ICD-10-CM | POA: Diagnosis not present

## 2022-04-10 DIAGNOSIS — I700 Atherosclerosis of aorta: Secondary | ICD-10-CM | POA: Diagnosis not present

## 2022-04-10 DIAGNOSIS — I10 Essential (primary) hypertension: Secondary | ICD-10-CM | POA: Diagnosis not present

## 2022-04-10 DIAGNOSIS — J449 Chronic obstructive pulmonary disease, unspecified: Secondary | ICD-10-CM | POA: Diagnosis not present

## 2022-04-10 DIAGNOSIS — L400 Psoriasis vulgaris: Secondary | ICD-10-CM | POA: Diagnosis not present

## 2022-04-10 NOTE — Progress Notes (Signed)
Daily Session Note  Patient Details  Name: Alexander Nunez MRN: [id number] Date of Birth: 11/25/1945 Referring Provider:   Flowsheet Row PULMONARY REHAB OTHER RESP ORIENTATION from 02/20/2022 in [place]  Referring Provider Dr. Vaughan Browner       Encounter Date: 04/10/2022  Check In:  Session Check In - 04/10/22 1045       Check-In   Supervising physician immediately available to respond to emergencies [facility] MD immediately available    Physician(s) Dr. Harl Bowie    Location [facility]    Staff Present Leana Roe, BS, Exercise Physiologist;Dalton Sherrie George, MS, ACSM-CEP;Melven Sartorius BSN, RN    Virtual Visit No    Medication changes reported     No    Fall or balance concerns reported    No    Tobacco Cessation No Change    Warm-up and Cool-down Performed as group-led Higher education careers adviser Performed Yes    VAD Patient? No    PAD/SET Patient? No      Pain Assessment   Currently in Pain? No/denies    Pain Score 0-No pain    Multiple Pain Sites No             Capillary Blood Glucose: No results found for this or any previous visit (from the past 24 hour(s)).    Social History   Tobacco Use  Smoking Status Former   Packs/day: 2.00   Years: 11.00   Total pack years: 22.00   Types: Cigarettes   Start date: 05/22/1962   Quit date: 05/21/1973   Years since quitting: 48.9  Smokeless Tobacco Never  Tobacco Comments   Quit 25 yrs now     Goals Met:  Proper associated with RPD/PD & O2 Sat Independence with exercise equipment Using PLB without cueing & demonstrates good technique Exercise tolerated well Queuing for purse lip breathing No report of concerns or symptoms today Strength training completed today  Goals Unmet:  Not Applicable  Comments: check out at 11:45   Dr. Kathie Dike is Medical Director for [facility] Pulmonary Rehab.

## 2022-04-15 ENCOUNTER — Encounter (HOSPITAL_COMMUNITY)
Admission: RE | Admit: 2022-04-15 | Discharge: 2022-04-15 | Disposition: A | Discharge status: Home / Self Care | Payer: Medicare HMO | Source: Ambulatory Visit | Attending: Pulmonary Disease | Admitting: Pulmonary Disease

## 2022-04-15 VITALS — Wt 273.8 lb

## 2022-04-15 DIAGNOSIS — J849 Interstitial pulmonary disease, unspecified: Secondary | ICD-10-CM

## 2022-04-15 NOTE — Progress Notes (Signed)
Daily Session Note  Patient Details  Name: Alexander Nunez MRN: [id number] Date of Birth: 09/16/1945 Referring Provider:   Flowsheet Row PULMONARY REHAB OTHER RESP ORIENTATION from 02/20/2022 in [place]  Referring Provider Dr. Vaughan Browner       Encounter Date: 04/15/2022  Check In:  Session Check In - 04/15/22 1045       Check-In   Supervising physician immediately available to respond to emergencies [facility] MD immediately available    Physician(s) Dr Dellia Cloud    Location [facility]    Staff Present Leana Roe, BS, Exercise Physiologist;Dalton Sherrie George, MS, ACSM-CEP;Madelyn Flavors, RN, BSN    Virtual Visit No    Medication changes reported     Yes    Comments (04/01/2022)Started  on Cellcept 500 mg twice daily. (04/08/22) Cellcept stopped by pt d/t reaction, MD aware.    Fall or balance concerns reported    No    Tobacco Cessation No Change    Warm-up and Cool-down Performed as group-led instruction    Resistance Training Performed Yes    VAD Patient? No    PAD/SET Patient? No      Pain Assessment   Currently in Pain? No/denies    Pain Score 0-No pain    Multiple Pain Sites No             Capillary Blood Glucose: No results found for this or any previous visit (from the past 24 hour(s)).    Social History   Tobacco Use  Smoking Status Former   Packs/day: 2.00   Years: 11.00   Total pack years: 22.00   Types: Cigarettes   Start date: 05/22/1962   Quit date: 05/21/1973   Years since quitting: 48.9  Smokeless Tobacco Never  Tobacco Comments   Quit 25 yrs now     Goals Met:  Proper associated with RPD/PD & O2 Sat Independence with exercise equipment Using PLB without cueing & demonstrates good technique Exercise tolerated well Queuing for purse lip breathing No report of concerns or symptoms today Strength training completed today  Goals Unmet:  Not Applicable  Comments: checkout at 1145.   Dr.  Kathie Dike is Medical Director for [facility] Pulmonary Rehab.

## 2022-04-17 ENCOUNTER — Encounter (HOSPITAL_COMMUNITY): Payer: Medicare HMO

## 2022-04-22 ENCOUNTER — Encounter (HOSPITAL_COMMUNITY)
Admission: RE | Admit: 2022-04-22 | Discharge: 2022-04-22 | Disposition: A | Discharge status: Home / Self Care | Payer: Medicare HMO | Source: Ambulatory Visit | Attending: Pulmonary Disease | Admitting: Pulmonary Disease

## 2022-04-22 DIAGNOSIS — G4733 Obstructive sleep apnea (adult) (pediatric): Secondary | ICD-10-CM | POA: Diagnosis not present

## 2022-04-22 DIAGNOSIS — J849 Interstitial pulmonary disease, unspecified: Secondary | ICD-10-CM

## 2022-04-22 DIAGNOSIS — R69 Illness, unspecified: Secondary | ICD-10-CM | POA: Diagnosis not present

## 2022-04-22 DIAGNOSIS — I10 Essential (primary) hypertension: Secondary | ICD-10-CM | POA: Diagnosis not present

## 2022-04-22 NOTE — Progress Notes (Signed)
Daily Session Note  Patient Details  Name: Alexander Nunez MRN: [id number] Date of Birth: 09/26/1945 Referring Provider:   Flowsheet Row PULMONARY REHAB OTHER RESP ORIENTATION from 02/20/2022 in [place]  Referring Provider Dr. Vaughan Browner       Encounter Date: 04/22/2022  Check In:  Session Check In - 04/22/22 1045       Check-In   Supervising physician immediately available to respond to emergencies [facility] MD immediately available    Physician(s) Dr Dellia Cloud    Location [facility]    Staff Present Leana Roe, BS, Exercise Physiologist;Dalton Sherrie George, MS, ACSM-CEP;Madelyn Flavors, RN, BSN    Virtual Visit No    Medication changes reported     Yes    Comments (04/01/2022)Started  on Cellcept 500 mg twice daily. (04/08/22) Cellcept stopped by pt d/t reaction, MD aware.    Fall or balance concerns reported    No    Tobacco Cessation No Change    Warm-up and Cool-down Performed as group-led instruction    Resistance Training Performed Yes      Pain Assessment   Currently in Pain? Yes    Pain Score 4     Pain Location Hip    Pain Orientation Left    Pain Descriptors / Indicators Aching    Pain Type Chronic pain    Multiple Pain Sites No             Capillary Blood Glucose: No results found for this or any previous visit (from the past 24 hour(s)).    Social History   Tobacco Use  Smoking Status Former   Packs/day: 2.00   Years: 11.00   Additional pack years: 0.00   Total pack years: 22.00   Types: Cigarettes   Start date: 05/22/1962   Quit date: 05/21/1973   Years since quitting: 48.9  Smokeless Tobacco Never  Tobacco Comments   Quit 25 yrs now     Goals Met:  Proper associated with RPD/PD & O2 Sat Independence with exercise equipment Using PLB without cueing & demonstrates good technique Exercise tolerated well Queuing for purse lip breathing No report of concerns or symptoms today Strength training  completed today  Goals Unmet:  Not Applicable  Comments: Checkout at 1145.   Dr. Kathie Dike is Medical Director for [facility] Pulmonary Rehab.

## 2022-04-24 ENCOUNTER — Encounter (HOSPITAL_COMMUNITY)
Admission: RE | Admit: 2022-04-24 | Discharge: 2022-04-24 | Disposition: A | Discharge status: Home / Self Care | Payer: Medicare HMO | Source: Ambulatory Visit | Attending: Pulmonary Disease | Admitting: Pulmonary Disease

## 2022-04-24 DIAGNOSIS — J849 Interstitial pulmonary disease, unspecified: Secondary | ICD-10-CM

## 2022-04-24 NOTE — Progress Notes (Signed)
Daily Session Note  Patient Details  Name: Alexander Nunez MRN: [id number] Date of Birth: Feb 16, 1945 Referring Provider:   Flowsheet Row PULMONARY REHAB OTHER RESP ORIENTATION from 02/20/2022 in [place]  Referring Provider Dr. Vaughan Browner       Encounter Date: 04/24/2022  Check In:  Session Check In - 04/24/22 1045       Check-In   Supervising physician immediately available to respond to emergencies [facility] MD immediately available    Physician(s) Dr. Harl Bowie    Location [facility]    Staff Present Leana Roe, BS, Exercise Physiologist;Libbie Bartley BSN, RN    Virtual Visit No    Medication changes reported     No    Comments no changes since Cell[employer] stopped on 04/08/22.  Pt stated he may restart Cellcept this weekend    Fall or balance concerns reported    No    Tobacco Cessation No Change    Warm-up and Cool-down Performed as group-led instruction    Resistance Training Performed Yes    VAD Patient? No    PAD/SET Patient? No      Pain Assessment   Currently in Pain? Yes    Pain Score 4     Pain Location Hip    Pain Orientation Left    Pain Descriptors / Indicators Aching    Pain Type Chronic pain    Pain Onset More than a month ago    Pain Frequency Constant    Multiple Pain Sites No             Capillary Blood Glucose: No results found for this or any previous visit (from the past 24 hour(s)).    Social History   Tobacco Use  Smoking Status Former   Packs/day: 2.00   Years: 11.00   Additional pack years: 0.00   Total pack years: 22.00   Types: Cigarettes   Start date: 05/22/1962   Quit date: 05/21/1973   Years since quitting: 48.9  Smokeless Tobacco Never  Tobacco Comments   Quit 25 yrs now     Goals Met:  Proper associated with RPD/PD & O2 Sat Independence with exercise equipment Using PLB without cueing & demonstrates good technique Exercise tolerated well Queuing for purse lip breathing No report  of concerns or symptoms today Strength training completed today  Goals Unmet:  Not Applicable  Comments: check out at 11:45   Dr. Kathie Dike is Medical Director for [facility].

## 2022-04-29 ENCOUNTER — Encounter (HOSPITAL_COMMUNITY)
Admission: RE | Admit: 2022-04-29 | Discharge: 2022-04-29 | Disposition: A | Discharge status: Home / Self Care | Payer: Medicare HMO | Source: Ambulatory Visit | Attending: Pulmonary Disease | Admitting: Pulmonary Disease

## 2022-04-29 VITALS — Wt 271.8 lb

## 2022-04-29 DIAGNOSIS — J849 Interstitial pulmonary disease, unspecified: Secondary | ICD-10-CM | POA: Diagnosis not present

## 2022-04-29 NOTE — Progress Notes (Signed)
Daily Session Note  Patient Details  Name: Alexander Nunez MRN: [id number] Date of Birth: 03-28-1945 Referring Provider:   Flowsheet Row PULMONARY REHAB OTHER RESP ORIENTATION from 02/20/2022 in [place]  Referring Provider Dr. Vaughan Browner       Encounter Date: 04/29/2022  Check In:  Session Check In - 04/29/22 1045       Check-In   Supervising physician immediately available to respond to emergencies Virtual Visit, No Supervising Physician Required.    Physician(s) Dr Domenic Polite    Location [facility]    Staff Present Leana Roe, BS, Exercise Physiologist;Rhyatt Muska Hassell Done, RN, BSN;Dalton Sherrie George, MS, ACSM-CEP    Virtual Visit No    Medication changes reported     No    Comments no changes since Cellcept stopped on 04/08/22.  Pt stated he may restart Cellcept this weekend    Fall or balance concerns reported    No    Tobacco Cessation No Change    Warm-up and Cool-down Performed as group-led instruction    Resistance Training Performed Yes    VAD Patient? No    PAD/SET Patient? No      Pain Assessment   Currently in Pain? Yes    Pain Score 4     Pain Location Hip    Pain Orientation Left    Pain Descriptors / Indicators Aching    Pain Type Chronic pain    Multiple Pain Sites No             Capillary Blood Glucose: No results found for this or any previous visit (from the past 24 hour(s)).    Social History   Tobacco Use  Smoking Status Former   Packs/day: 2.00   Years: 11.00   Additional pack years: 0.00   Total pack years: 22.00   Types: Cigarettes   Start date: 05/22/1962   Quit date: 05/21/1973   Years since quitting: 48.9  Smokeless Tobacco Never  Tobacco Comments   Quit 25 yrs now     Goals Met:  Proper associated with RPD/PD & O2 Sat Independence with exercise equipment Using PLB without cueing & demonstrates good technique Exercise tolerated well Queuing for purse lip breathing No report of concerns  or symptoms today Strength training completed today  Goals Unmet:  Not Applicable  Comments: Checkout at 1145.   Dr. Kathie Dike is Medical Director for [facility].

## 2022-04-30 NOTE — Progress Notes (Signed)
Pulmonary Individual Treatment Plan  Patient Details  Name: Alexander Nunez MRN: [id number] Date of Birth: 14-Oct-1945 Referring Provider:   Flowsheet Row PULMONARY REHAB OTHER RESP ORIENTATION from 02/20/2022 in [facility]  Referring Provider Dr. Vaughan Browner       Initial Encounter Date:  Flowsheet Row PULMONARY REHAB OTHER RESP ORIENTATION from 02/20/2022 in [facility]  Date 02/20/22       Visit Diagnosis: Interstitial lung disease ([place])  Patient's Home Medications on Admission:   Current Outpatient Medications:    albuterol (VENTOLIN HFA) 108 (90 Base) MCG/ACT inhaler, Inhale 1-2 puffs into the lungs every 4 (four) hours as needed for shortness of breath or wheezing., Disp: 6.7 g, Rfl: 5   ALPRAZolam (XANAX) 0.5 MG tablet, Take 0.25-0.5 mg by mouth 3 (three) times daily as needed for sleep., Disp: , Rfl:    Azelastine HCl 137 MCG/SPRAY SOLN, PLACE 2 SPRAYS INTO BOTH NOSTRILS 2 TIMES DAILY AS NEEDED FOR RHINITIS., Disp: 90 mL, Rfl: 1   doxazosin (CARDURA) 4 MG tablet, Take 4 mg by mouth at bedtime.  , Disp: , Rfl:    escitalopram (LEXAPRO) 5 MG tablet, Take 5 mg by mouth daily., Disp: , Rfl:    fluticasone (FLONASE) 50 MCG/ACT nasal spray, Place 2 sprays into both nostrils daily., Disp: 16 g, Rfl: 11   losartan (COZAAR) 25 MG tablet, Take 25 mg by mouth daily., Disp: , Rfl:    metoprolol tartrate (LOPRESSOR) 25 MG tablet, Take 25 mg by mouth 2 (two) times daily., Disp: , Rfl:    mycophenolate (CELLCEPT) 500 MG tablet, Take 1 tablet (500 mg total) by mouth 2 (two) times daily., Disp: 60 tablet, Rfl: 5   Nintedanib (OFEV) 150 MG CAPS, Take 1 capsule (150 mg total) by mouth 2 (two) times daily., Disp: 90 capsule, Rfl: 0   pantoprazole (PROTONIX) 40 MG tablet, Take 40 mg by mouth daily., Disp: , Rfl:    pravastatin (PRAVACHOL) 20 MG tablet, Take 20 mg by mouth every evening. , Disp: , Rfl:   Past Medical History: Past Medical History:   Diagnosis Date   Anxiety    Arthritis    back   Back pain, chronic    Colon polyp    Diverticulitis    GERD (gastroesophageal reflux disease)    Heart palpitations    High cholesterol    Hypertension    Irritable bowel syndrome    Osteoporosis    Psoriasis    Pulmonary fibrosis (HCC)    Sleep apnea    c- pap    Tobacco Use: Social History   Tobacco Use  Smoking Status Former   Packs/day: 2.00   Years: 11.00   Additional pack years: 0.00   Total pack years: 22.00   Types: Cigarettes   Start date: 05/22/1962   Quit date: 05/21/1973   Years since quitting: 48.9  Smokeless Tobacco Never  Tobacco Comments   Quit 25 yrs now     Labs: Review Flowsheet       Latest Ref Rng & Units 03/26/2021  Labs for ITP Cardiac and Pulmonary Rehab  Cholestrol 0 - 200 mg/dL 140   LDL (calc) 0 - 99 mg/dL 83   HDL-C >40 mg/dL 43   Trlycerides <150 mg/dL 71     Capillary Blood Glucose: No results found for: "GLUCAP"   Pulmonary Assessment Scores:  Pulmonary Assessment Scores     Row Name 02/20/22 1253  ADL UCSD   ADL Phase Entry     SOB Score total 45     Rest 0     Walk 2     Stairs 3     Bath 1     Dress 2     Shop 2       CAT Score   CAT Score 16       mMRC Score   mMRC Score 1             UCSD: Self-administered rating of dyspnea associated with activities of daily living (ADLs) 6-point scale (0 = "not at all" to 5 = "maximal or unable to do because of breathlessness")  Scoring Scores range from 0 to 120.  Minimally important difference is 5 units  CAT: CAT can identify the health impairment of COPD patients and is better correlated with disease progression.  CAT has a scoring range of zero to 40. The CAT score is classified into four groups of low (less than 10), medium (10 - 20), high (21-30) and very high (31-40) based on the impact level of disease on health status. A CAT score over 10 suggests significant symptoms.  A worsening CAT score  could be explained by an exacerbation, poor medication adherence, poor inhaler technique, or progression of COPD or comorbid conditions.  CAT MCID is 2 points  mMRC: mMRC (Modified Medical Research Council) Dyspnea Scale is used to assess the degree of baseline functional disability in patients of respiratory disease due to dyspnea. No minimal important difference is established. A decrease in score of 1 point or greater is considered a positive change.   Pulmonary Function Assessment:   Exercise Target Goals: Exercise Program Goal: Individual exercise prescription set using results from initial 6 min walk test and THRR while considering  patient's activity barriers and safety.   Exercise Prescription Goal: Initial exercise prescription builds to 30-45 minutes a day of aerobic activity, 2-3 days per week.  Home exercise guidelines will be given to patient during program as part of exercise prescription that the participant will acknowledge.  Activity Barriers & Risk Stratification:  Activity Barriers & Cardiac Risk Stratification - 02/20/22 1313       Activity Barriers & Cardiac Risk Stratification   Activity Barriers Joint Problems    Cardiac Risk Stratification High             6 Minute Walk:  6 Minute Walk     Row Name 02/20/22 1317         6 Minute Walk   Phase Initial     Distance 1350 feet     Walk Time 6 minutes     # of Rest Breaks 0     MPH 2.55     METS 2.41     RPE 13     Perceived Dyspnea  12     VO2 Peak 8.46     Symptoms No     Resting HR 73 bpm     Resting BP 128/68     Resting Oxygen Saturation  96 %     Exercise Oxygen Saturation  during 6 min walk 91 %     Max Ex. HR 116 bpm     Max Ex. BP 160/70     2 Minute Post BP 130/68       Interval HR   1 Minute HR 73     2 Minute HR 73     3 Minute HR 73  4 Minute HR 80     5 Minute HR 116     6 Minute HR 116     2 Minute Post HR 76     Interval Heart Rate? Yes       Interval Oxygen    Interval Oxygen? Yes     Baseline Oxygen Saturation % 96 %     1 Minute Oxygen Saturation % 95 %     1 Minute Liters of Oxygen 0 L     2 Minute Oxygen Saturation % 92 %     2 Minute Liters of Oxygen 0 L     3 Minute Oxygen Saturation % 91 %     3 Minute Liters of Oxygen 0 L     4 Minute Oxygen Saturation % 93 %     4 Minute Liters of Oxygen 0 L     5 Minute Oxygen Saturation % 93 %     5 Minute Liters of Oxygen 0 L     6 Minute Oxygen Saturation % 91 %     6 Minute Liters of Oxygen 0 L     2 Minute Post Oxygen Saturation % 96 %     2 Minute Post Liters of Oxygen 0 L              Oxygen Initial Assessment:  Oxygen Initial Assessment - 02/20/22 1252       Home Oxygen   Home Oxygen Device None    Sleep Oxygen Prescription CPAP    Liters per minute 0    Home Exercise Oxygen Prescription None    Home Resting Oxygen Prescription None    Compliance with Home Oxygen Use Yes      Initial 6 min Walk   Oxygen Used None      Program Oxygen Prescription   Program Oxygen Prescription None      Intervention   Short Term Goals To learn and understand importance of monitoring SPO2 with pulse oximeter and demonstrate accurate use of the pulse oximeter.;To learn and understand importance of maintaining oxygen saturations>88%;To learn and demonstrate proper pursed lip breathing techniques or other breathing techniques.     Long  Term Goals Verbalizes importance of monitoring SPO2 with pulse oximeter and return demonstration;Maintenance of O2 saturations>88%;Exhibits proper breathing techniques, such as pursed lip breathing or other method taught during program session;Compliance with respiratory medication             Oxygen Re-Evaluation:  Oxygen Re-Evaluation     Row Name 04/01/22 1310 04/29/22 1315           Program Oxygen Prescription   Program Oxygen Prescription None None        Home Oxygen   Home Oxygen Device None None      Sleep Oxygen Prescription CPAP CPAP       Liters per minute 0 0      Home Exercise Oxygen Prescription None None      Home Resting Oxygen Prescription None None      Compliance with Home Oxygen Use Yes Yes        Goals/Expected Outcomes   Short Term Goals To learn and understand importance of monitoring SPO2 with pulse oximeter and demonstrate accurate use of the pulse oximeter.;To learn and understand importance of maintaining oxygen saturations>88%;To learn and demonstrate proper pursed lip breathing techniques or other breathing techniques.  To learn and understand importance of monitoring SPO2 with pulse oximeter and demonstrate accurate use of the  pulse oximeter.;To learn and understand importance of maintaining oxygen saturations>88%;To learn and demonstrate proper pursed lip breathing techniques or other breathing techniques.       Long  Term Goals Verbalizes importance of monitoring SPO2 with pulse oximeter and return demonstration;Maintenance of O2 saturations>88%;Exhibits proper breathing techniques, such as pursed lip breathing or other method taught during program session;Compliance with respiratory medication Verbalizes importance of monitoring SPO2 with pulse oximeter and return demonstration;Maintenance of O2 saturations>88%;Exhibits proper breathing techniques, such as pursed lip breathing or other method taught during program session;Compliance with respiratory medication      Goals/Expected Outcomes compliant compliant               Oxygen Discharge (Final Oxygen Re-Evaluation):  Oxygen Re-Evaluation - 04/29/22 1315       Program Oxygen Prescription   Program Oxygen Prescription None      Home Oxygen   Home Oxygen Device None    Sleep Oxygen Prescription CPAP    Liters per minute 0    Home Exercise Oxygen Prescription None    Home Resting Oxygen Prescription None    Compliance with Home Oxygen Use Yes      Goals/Expected Outcomes   Short Term Goals To learn and understand importance of monitoring SPO2 with  pulse oximeter and demonstrate accurate use of the pulse oximeter.;To learn and understand importance of maintaining oxygen saturations>88%;To learn and demonstrate proper pursed lip breathing techniques or other breathing techniques.     Long  Term Goals Verbalizes importance of monitoring SPO2 with pulse oximeter and return demonstration;Maintenance of O2 saturations>88%;Exhibits proper breathing techniques, such as pursed lip breathing or other method taught during program session;Compliance with respiratory medication    Goals/Expected Outcomes compliant             Initial Exercise Prescription:  Initial Exercise Prescription - 02/20/22 1300       Date of Initial Exercise RX and Referring Provider   Date 02/20/22    Referring Provider Dr. Vaughan Browner    Expected Discharge Date 06/24/22      Treadmill   MPH 1    Grade 0    Minutes 17      NuStep   Level 1    SPM 60    Minutes 22      Prescription Details   Frequency (times per week) 2    Duration Progress to 30 minutes of continuous aerobic without signs/symptoms of physical distress      Intensity   THRR 40-80% of Max Heartrate 58-116    Ratings of Perceived Exertion 11-13    Perceived Dyspnea 0-4      Resistance Training   Training Prescription Yes    Weight 4    Reps 10-15             Perform Capillary Blood Glucose checks as needed.  Exercise Prescription Changes:   Exercise Prescription Changes     Row Name 03/04/22 1100 03/13/22 1200 03/25/22 1200 04/01/22 1300 04/15/22 1200     Response to Exercise   Blood Pressure (Admit) 130/70 148/68 -- 126/78 142/64   Blood Pressure (Exercise) 142/80 148/70 -- 148/78 150/72   Blood Pressure (Exit) 136/72 130/74 -- 140/70 126/76   Heart Rate (Admit) 60 bpm 69 bpm -- 64 bpm 64 bpm   Heart Rate (Exercise) 77 bpm 67 bpm -- 76 bpm 67 bpm   Heart Rate (Exit) 67 bpm 62 bpm -- 69 bpm 67 bpm   Oxygen Saturation (Admit) 97 % 91 % --  95 % 94 %   Oxygen Saturation  (Exercise) 96 % 95 % -- 94 % 95 %   Oxygen Saturation (Exit) 96 % 95 % -- 96 % 95 %   Rating of Perceived Exertion (Exercise) 12 12 -- 12 12   Perceived Dyspnea (Exercise) 12 12 -- 12 12   Duration Continue with 30 min of aerobic exercise without signs/symptoms of physical distress. Continue with 30 min of aerobic exercise without signs/symptoms of physical distress. -- Continue with 30 min of aerobic exercise without signs/symptoms of physical distress. Continue with 30 min of aerobic exercise without signs/symptoms of physical distress.   Intensity THRR unchanged THRR unchanged -- THRR unchanged THRR unchanged     Progression   Progression Continue to progress workloads to maintain intensity without signs/symptoms of physical distress. Continue to progress workloads to maintain intensity without signs/symptoms of physical distress. -- Continue to progress workloads to maintain intensity without signs/symptoms of physical distress. Continue to progress workloads to maintain intensity without signs/symptoms of physical distress.     Resistance Training   Training Prescription Yes Yes -- Yes Yes   Weight 5 5 -- 5 5   Reps 10-15 10-15 -- 10-15 10-15   Time 10 Minutes 10 Minutes -- 10 Minutes 10 Minutes     NuStep   Level 2 2 -- 2 3   SPM 109 110 -- 106 89   Minutes 39 39 -- 39 39   METs 2.1 2.1 -- 2 2     Home Exercise Plan   Plans to continue exercise at -- -- Home (comment) -- --   Frequency -- -- Add 3 additional days to program exercise sessions. -- --   Initial Home Exercises Provided -- -- 03/25/22 -- --    [place] Name 04/29/22 1300             Response to Exercise   Blood Pressure (Admit) 130/60       Blood Pressure (Exercise) 146/76       Blood Pressure (Exit) 130/68       Heart Rate (Admit) 61 bpm       Heart Rate (Exercise) 69 bpm       Heart Rate (Exit) 66 bpm       Oxygen Saturation (Admit) 96 %       Oxygen Saturation (Exercise) 94 %       Oxygen Saturation (Exit) 95  %       Rating of Perceived Exertion (Exercise) 12       Perceived Dyspnea (Exercise) 12       Duration Continue with 30 min of aerobic exercise without signs/symptoms of physical distress.       Intensity THRR unchanged         Progression   Progression Continue to progress workloads to maintain intensity without signs/symptoms of physical distress.         Resistance Training   Training Prescription Yes       Weight 5       Reps 10-15       Time 10 Minutes         NuStep   Level 3       SPM 100       Minutes 39       METs 2.1                Exercise Comments:   Exercise Comments     Row Name 03/25/22 1253  Exercise Comments home exercise reviewed                Exercise Goals and Review:   Exercise Goals     Row Name 02/20/22 1319 03/04/22 1149 04/01/22 1305 04/29/22 1310       Exercise Goals   Increase Physical Activity Yes Yes Yes Yes    Intervention Provide advice, education, support and counseling about physical activity/exercise needs.;Develop an individualized exercise prescription for aerobic and resistive training based on initial evaluation findings, risk stratification, comorbidities and participant's personal goals. Provide advice, education, support and counseling about physical activity/exercise needs.;Develop an individualized exercise prescription for aerobic and resistive training based on initial evaluation findings, risk stratification, comorbidities and participant's personal goals. Provide advice, education, support and counseling about physical activity/exercise needs.;Develop an individualized exercise prescription for aerobic and resistive training based on initial evaluation findings, risk stratification, comorbidities and participant's personal goals. Provide advice, education, support and counseling about physical activity/exercise needs.;Develop an individualized exercise prescription for aerobic and resistive training based  on initial evaluation findings, risk stratification, comorbidities and participant's personal goals.    Expected Outcomes Short Term: Attend rehab on a regular basis to increase amount of physical activity.;Long Term: Add in home exercise to make exercise part of routine and to increase amount of physical activity.;Long Term: Exercising regularly at least 3-5 days a week. Short Term: Attend rehab on a regular basis to increase amount of physical activity.;Long Term: Add in home exercise to make exercise part of routine and to increase amount of physical activity.;Long Term: Exercising regularly at least 3-5 days a week. Short Term: Attend rehab on a regular basis to increase amount of physical activity.;Long Term: Add in home exercise to make exercise part of routine and to increase amount of physical activity.;Long Term: Exercising regularly at least 3-5 days a week. Short Term: Attend rehab on a regular basis to increase amount of physical activity.;Long Term: Add in home exercise to make exercise part of routine and to increase amount of physical activity.;Long Term: Exercising regularly at least 3-5 days a week.    Increase Strength and Stamina Yes Yes Yes Yes    Intervention Provide advice, education, support and counseling about physical activity/exercise needs.;Develop an individualized exercise prescription for aerobic and resistive training based on initial evaluation findings, risk stratification, comorbidities and participant's personal goals. Provide advice, education, support and counseling about physical activity/exercise needs.;Develop an individualized exercise prescription for aerobic and resistive training based on initial evaluation findings, risk stratification, comorbidities and participant's personal goals. Provide advice, education, support and counseling about physical activity/exercise needs.;Develop an individualized exercise prescription for aerobic and resistive training based on  initial evaluation findings, risk stratification, comorbidities and participant's personal goals. Provide advice, education, support and counseling about physical activity/exercise needs.;Develop an individualized exercise prescription for aerobic and resistive training based on initial evaluation findings, risk stratification, comorbidities and participant's personal goals.    Expected Outcomes Short Term: Increase workloads from initial exercise prescription for resistance, speed, and METs.;Short Term: Perform resistance training exercises routinely during rehab and add in resistance training at home;Long Term: Improve cardiorespiratory fitness, muscular endurance and strength as measured by increased METs and functional capacity (6MWT) Short Term: Increase workloads from initial exercise prescription for resistance, speed, and METs.;Short Term: Perform resistance training exercises routinely during rehab and add in resistance training at home;Long Term: Improve cardiorespiratory fitness, muscular endurance and strength as measured by increased METs and functional capacity (6MWT) Short Term: Increase workloads from initial exercise prescription  for resistance, speed, and METs.;Short Term: Perform resistance training exercises routinely during rehab and add in resistance training at home;Long Term: Improve cardiorespiratory fitness, muscular endurance and strength as measured by increased METs and functional capacity (6MWT) Short Term: Increase workloads from initial exercise prescription for resistance, speed, and METs.;Short Term: Perform resistance training exercises routinely during rehab and add in resistance training at home;Long Term: Improve cardiorespiratory fitness, muscular endurance and strength as measured by increased METs and functional capacity (6MWT)    Able to understand and use rate of perceived exertion (RPE) scale Yes Yes Yes Yes    Intervention Provide education and explanation on how to  use RPE scale Provide education and explanation on how to use RPE scale Provide education and explanation on how to use RPE scale Provide education and explanation on how to use RPE scale    Expected Outcomes Short Term: Able to use RPE daily in rehab to express subjective intensity level;Long Term:  Able to use RPE to guide intensity level when exercising independently Short Term: Able to use RPE daily in rehab to express subjective intensity level;Long Term:  Able to use RPE to guide intensity level when exercising independently Short Term: Able to use RPE daily in rehab to express subjective intensity level;Long Term:  Able to use RPE to guide intensity level when exercising independently Short Term: Able to use RPE daily in rehab to express subjective intensity level;Long Term:  Able to use RPE to guide intensity level when exercising independently    Able to understand and use Dyspnea scale Yes Yes Yes Yes    Intervention Provide education and explanation on how to use Dyspnea scale Provide education and explanation on how to use Dyspnea scale Provide education and explanation on how to use Dyspnea scale Provide education and explanation on how to use Dyspnea scale    Expected Outcomes Short Term: Able to use Dyspnea scale daily in rehab to express subjective sense of shortness of breath during exertion;Long Term: Able to use Dyspnea scale to guide intensity level when exercising independently Short Term: Able to use Dyspnea scale daily in rehab to express subjective sense of shortness of breath during exertion;Long Term: Able to use Dyspnea scale to guide intensity level when exercising independently Short Term: Able to use Dyspnea scale daily in rehab to express subjective sense of shortness of breath during exertion;Long Term: Able to use Dyspnea scale to guide intensity level when exercising independently Short Term: Able to use Dyspnea scale daily in rehab to express subjective sense of shortness of  breath during exertion;Long Term: Able to use Dyspnea scale to guide intensity level when exercising independently    Knowledge and understanding of Target Heart Rate Range (THRR) Yes Yes Yes Yes    Intervention Provide education and explanation of THRR including how the numbers were predicted and where they are located for reference Provide education and explanation of THRR including how the numbers were predicted and where they are located for reference Provide education and explanation of THRR including how the numbers were predicted and where they are located for reference Provide education and explanation of THRR including how the numbers were predicted and where they are located for reference    Expected Outcomes Short Term: Able to state/look up THRR;Short Term: Able to use daily as guideline for intensity in rehab;Long Term: Able to use THRR to govern intensity when exercising independently Short Term: Able to state/look up THRR;Short Term: Able to use daily as guideline for intensity  in rehab;Long Term: Able to use THRR to govern intensity when exercising independently Short Term: Able to state/look up THRR;Short Term: Able to use daily as guideline for intensity in rehab;Long Term: Able to use THRR to govern intensity when exercising independently Short Term: Able to state/look up THRR;Short Term: Able to use daily as guideline for intensity in rehab;Long Term: Able to use THRR to govern intensity when exercising independently    Understanding of Exercise Prescription Yes Yes Yes Yes    Intervention Provide education, explanation, and written materials on patient's individual exercise prescription Provide education, explanation, and written materials on patient's individual exercise prescription Provide education, explanation, and written materials on patient's individual exercise prescription Provide education, explanation, and written materials on patient's individual exercise prescription     Expected Outcomes Short Term: Able to explain program exercise prescription;Long Term: Able to explain home exercise prescription to exercise independently Short Term: Able to explain program exercise prescription;Long Term: Able to explain home exercise prescription to exercise independently Short Term: Able to explain program exercise prescription;Long Term: Able to explain home exercise prescription to exercise independently Short Term: Able to explain program exercise prescription;Long Term: Able to explain home exercise prescription to exercise independently             Exercise Goals Re-Evaluation :  Exercise Goals Re-Evaluation     Row Name 03/04/22 1150 04/01/22 1305 04/29/22 1310         Exercise Goal Re-Evaluation   Exercise Goals Review Increase Physical Activity;Increase Strength and Stamina;Able to understand and use rate of perceived exertion (RPE) scale;Able to understand and use Dyspnea scale;Knowledge and understanding of Target Heart Rate Range (THRR);Understanding of Exercise Prescription Increase Physical Activity;Increase Strength and Stamina;Able to understand and use rate of perceived exertion (RPE) scale;Able to understand and use Dyspnea scale;Knowledge and understanding of Target Heart Rate Range (THRR);Understanding of Exercise Prescription Increase Physical Activity;Increase Strength and Stamina;Able to understand and use rate of perceived exertion (RPE) scale;Able to understand and use Dyspnea scale;Knowledge and understanding of Target Heart Rate Range (THRR);Understanding of Exercise Prescription     Comments Pt has completed 3 sessions of PR. He has L hip bursitis that bothers him when exercising. He was not able to use the treadmill due to the impact on his hip causing the bursitis pain to worsen. He is currently using the stepper both times and stated that his hip does not bother him when exercising. He has increased his  workload already and is currently exercising  at 2.1 METs on the stepper. Will continue to monitor and progress as able. Pt has completed 9 sessions of PR. He is tolerating exercise well on the stepper. He is still not able to walk on the treadmill due to his L hip bursitis. He recived a shot earlier this month and stated that it only helped his hip a little. He is currently exercising at 2.1 METs on the stepper. Will continue to monitor and progress as able. Pt has completed 15 sessions of PR. He is tolerating exercise well on the stepper and it is not aggervating his L hip bursitis. He is currently exercising at 2.1 METs on the stepper. Will continue to monitor and progress as able.     Expected Outcomes Through exercise at rehab and home, the patient will meet their stated goals. Through exercise at rehab and home, the patient will meet their stated goals. Through exercise at rehab and home, the patient will meet their stated goals.  Discharge Exercise Prescription (Final Exercise Prescription Changes):  Exercise Prescription Changes - 04/29/22 1300       Response to Exercise   Blood Pressure (Admit) 130/60    Blood Pressure (Exercise) 146/76    Blood Pressure (Exit) 130/68    Heart Rate (Admit) 61 bpm    Heart Rate (Exercise) 69 bpm    Heart Rate (Exit) 66 bpm    Oxygen Saturation (Admit) 96 %    Oxygen Saturation (Exercise) 94 %    Oxygen Saturation (Exit) 95 %    Rating of Perceived Exertion (Exercise) 12    Perceived Dyspnea (Exercise) 12    Duration Continue with 30 min of aerobic exercise without signs/symptoms of physical distress.    Intensity THRR unchanged      Progression   Progression Continue to progress workloads to maintain intensity without signs/symptoms of physical distress.      Resistance Training   Training Prescription Yes    Weight 5    Reps 10-15    Time 10 Minutes      NuStep   Level 3    SPM 100    Minutes 39    METs 2.1             Nutrition:  Target Goals: Understanding  of nutrition guidelines, daily intake of sodium 1500mg , cholesterol 200mg , calories 30% from fat and 7% or less from saturated fats, daily to have 5 or more servings of fruits and vegetables.  Biometrics:  Pre Biometrics - 02/20/22 1320       Pre Biometrics   Height 5\' 10"  (1.778 m)    Weight 126.6 kg    Waist Circumference 48.5 inches    Hip Circumference 50 inches    Waist to Hip Ratio 0.97 %    BMI (Calculated) 40.05    Triceps Skinfold 20 mm    % Body Fat 37.1 %    Grip Strength 44.3 kg    Flexibility 0 in    Single Leg Stand 6.68 seconds              Nutrition Therapy Plan and Nutrition Goals:  Nutrition Therapy & Goals - 02/26/22 1346       Personal Nutrition Goals   Comments Patient's diet assessment score was 50. We offer 2 educational sessions on heart heatlhy nutrition with handouts and assistance with RD referral if patient is interested.      Intervention Plan   Expected Outcomes Short Term Goal: Understand basic principles of dietary content, such as calories, fat, sodium, cholesterol and nutrients.             Nutrition Assessments:  Nutrition Assessments - 02/20/22 1256       MEDFICTS Scores   Pre Score 50            MEDIFICTS Score Key: ?70 Need to make dietary changes  40-70 Heart Healthy Diet ? 40 Therapeutic Level Cholesterol Diet   Picture Your Plate Scores: [id number] Unhealthy dietary pattern with much room for improvement. 41-50 Dietary pattern unlikely to meet recommendations for good health and room for improvement. 51-60 More healthful dietary pattern, with some room for improvement.  >60 Healthy dietary pattern, although there may be some specific behaviors that could be improved.    Nutrition Goals Re-Evaluation:   Nutrition Goals Discharge (Final Nutrition Goals Re-Evaluation):   Psychosocial: Target Goals: Acknowledge presence or absence of significant depression and/or stress, maximize coping skills, provide positive  support system. Participant is able to  verbalize types and ability to use techniques and skills needed for reducing stress and depression.  Initial Review & Psychosocial Screening:  Initial Psych Review & Screening - 02/20/22 1314       Initial Review   Current issues with Current Stress Concerns;Current Sleep Concerns;Current Anxiety/Panic    Source of Stress Concerns Chronic Illness    Comments His ILD is progressing, so this does stress him out and cause some anxiety      Family Dynamics   Good Support System? Yes    Comments His support system includes his wife and his two sons.      Barriers   Psychosocial barriers to participate in program There are no identifiable barriers or psychosocial needs.      Screening Interventions   Interventions Encouraged to exercise    Expected Outcomes Short Term goal: Identification and review with participant of any Quality of Life or Depression concerns found by scoring the questionnaire.;Long Term goal: The participant improves quality of Life and PHQ9 Scores as seen by post scores and/or verbalization of changes;Long Term Goal: Stressors or current issues are controlled or eliminated.             Quality of Life Scores:  Quality of Life - 02/20/22 1320       Quality of Life   Select Quality of Life      Quality of Life Scores   Health/Function Pre 20.16 %    Socioeconomic Pre 23.57 %    Psych/Spiritual Pre 22.43 %    Family Pre 28.8 %    GLOBAL Pre 22.53 %            Scores of 19 and below usually indicate a poorer quality of life in these areas.  A difference of  2-3 points is a clinically meaningful difference.  A difference of 2-3 points in the total score of the Quality of Life Index has been associated with significant improvement in overall quality of life, self-image, physical symptoms, and general health in studies assessing change in quality of life.   PHQ-9: Review Flowsheet       02/20/2022  Depression screen  PHQ 2/9  Decreased Interest 0  Down, Depressed, Hopeless 0  PHQ - 2 Score 0  Altered sleeping 0  Tired, decreased energy 1  Change in appetite 0  Feeling bad or failure about yourself  0  Trouble concentrating 0  Moving slowly or fidgety/restless 0  Suicidal thoughts 0  PHQ-9 Score 1  Difficult doing work/chores Not difficult at all   Interpretation of Total Score  Total Score Depression Severity:  1-4 = Minimal depression, 5-9 = Mild depression, 10-14 = Moderate depression, 15-19 = Moderately severe depression, 20-27 = Severe depression   Psychosocial Evaluation and Intervention:  Psychosocial Evaluation - 02/20/22 1339       Psychosocial Evaluation & Interventions   Interventions Encouraged to exercise with the program and follow exercise prescription    Comments Pt has no barriers to participate in PR. He does anxiety, sleep, and stress, but all of which are well controlled. He has no other identifiable psychosocial issues. He takes xanax for his sleep. He is prescribed 0.5 mg tid, but he reports that he only takes half of a tablet a few nights per week. His anxiety and stress stem from his ILD. He was told at his last visit with Dr. Vaughan Browner that his ILD is progressing, so this does cause him stress and anxiety. Dr. Gerarda Fraction, his PCP, prescribed  him Lexapro for the anxiety, but he reports that he is not taking it. He has been taking ofev, an antifibrotic medication, for his ILD, and this is giving him hope for his future. He is also mentions how lucky he is to not be experiencing any side effects from ofev. He scored a 1 on his PHQ-9, and this is due to his lack of energy. He reports that he has a good support system from his wife and two sons. His goals while in the program are to decrease his SOB with exertion and to lose weight. He is eager to start the program.    Expected Outcomes Pt's stress, anxiety, and sleep will continue to be managed, and he will have no other identifiable  psychosocial issues.    Continue Psychosocial Services  No Follow up required             Psychosocial Re-Evaluation:  Psychosocial Re-Evaluation     [facility] Name 02/26/22 1338 03/27/22 0748 04/23/22 0836         Psychosocial Re-Evaluation   Current issues with Current Anxiety/Panic;Current Stress Concerns;Current Sleep Concerns Current Anxiety/Panic;Current Stress Concerns;Current Sleep Concerns Current Anxiety/Panic;Current Stress Concerns;Current Sleep Concerns     Comments Patient is new to the program. He has completed 1 session. His sleep and anxiety continues to be managed with xanax. He continues to have no psychosocial barriers identified. We will continue monitor his progress. Patient has completed  7 session. His sleep and anxiety continues to be managed with Alprazolam. He seems to enjoy the sessions and demonstrates an interest in improving his health. He continues to have no psychosocial barriers identified. We will continue monitor his progress. Patient has completed 13 sessions.  His sleep and anxiety continue to be managed with Alprazolam.  He seems to enjoy the sessions and continues to demonstrate an interest in improving his health.  He continues to have no psychosocial barriers identified.  We will continue to monitor his progress.     Expected Outcomes Patient will continue to have no psychosocial barriers identified. Patient will continue to have no psychosocial barriers identified. Patient will continue to have no psychosocial barriers identified.     Interventions Relaxation education;Stress management education;Encouraged to attend Pulmonary Rehabilitation for the exercise Relaxation education;Stress management education;Encouraged to attend Pulmonary Rehabilitation for the exercise Relaxation education;Stress management education;Encouraged to attend Pulmonary Rehabilitation for the exercise     Continue Psychosocial Services  No Follow up required No Follow up required No  Follow up required       Initial Review   Source of Stress Concerns Chronic Illness Chronic Illness Chronic Illness     Comments His ILD is progressing, so this does stress him out and cause some anxiety His ILD is progressing, so this does stress him out and cause some anxiety His ILD is progressing, so this does stress him out and cause some anxiety              Psychosocial Discharge (Final Psychosocial Re-Evaluation):  Psychosocial Re-Evaluation - 04/23/22 0836       Psychosocial Re-Evaluation   Current issues with Current Anxiety/Panic;Current Stress Concerns;Current Sleep Concerns    Comments Patient has completed 13 sessions.  His sleep and anxiety continue to be managed with Alprazolam.  He seems to enjoy the sessions and continues to demonstrate an interest in improving his health.  He continues to have no psychosocial barriers identified.  We will continue to monitor his progress.    Expected Outcomes Patient  will continue to have no psychosocial barriers identified.    Interventions Relaxation education;Stress management education;Encouraged to attend Pulmonary Rehabilitation for the exercise    Continue Psychosocial Services  No Follow up required      Initial Review   Source of Stress Concerns Chronic Illness    Comments His ILD is progressing, so this does stress him out and cause some anxiety              Education: Education Goals: Education classes will be provided on a weekly basis, covering required topics. Participant will state understanding/return demonstration of topics presented.  Learning Barriers/Preferences:  Learning Barriers/Preferences - 02/20/22 1319       Learning Barriers/Preferences   Learning Barriers None    Learning Preferences Written Material             Education Topics: How Lungs Work and Diseases: - Discuss the anatomy of the lungs and diseases that can affect the lungs, such as COPD.   Exercise: -Discuss the  importance of exercise, FITT principles of exercise, normal and abnormal responses to exercise, and how to exercise safely.   Environmental Irritants: -Discuss types of environmental irritants and how to limit exposure to environmental irritants.   Meds/Inhalers and oxygen: - Discuss respiratory medications, definition of an inhaler and oxygen, and the proper way to use an inhaler and oxygen.   Energy Saving Techniques: - Discuss methods to conserve energy and decrease shortness of breath when performing activities of daily living.  Flowsheet Row PULMONARY REHAB OTHER RESPIRATORY from 04/24/2022 in [place]  Date 02/27/22  Educator HB  Instruction Review Code 1- Verbalizes Understanding       Bronchial Hygiene / Breathing Techniques: - Discuss breathing mechanics, pursed-lip breathing technique,  proper posture, effective ways to clear airways, and other functional breathing techniques   Cleaning Equipment: - Provides group verbal and written instruction about the health risks of elevated stress, cause of high stress, and healthy ways to reduce stress. Flowsheet Row PULMONARY REHAB OTHER RESPIRATORY from 04/24/2022 in [place]  Date 03/13/22  Educator HB  Instruction Review Code 1- Verbalizes Understanding       Nutrition I: Fats: - Discuss the types of cholesterol, what cholesterol does to the body, and how cholesterol levels can be controlled. Flowsheet Row PULMONARY REHAB OTHER RESPIRATORY from 04/24/2022 in [place]  Date 03/20/22  Educator DF  Instruction Review Code 1- Verbalizes Understanding       Nutrition II: Labels: -Discuss the different components of food labels and how to read food labels. Flowsheet Row PULMONARY REHAB OTHER RESPIRATORY from 04/24/2022 in [facility]  Date 03/27/22  Educator DF  Instruction Review Code 2- Demonstrated Understanding        Respiratory Infections: - Discuss the signs and symptoms of respiratory infections, ways to prevent respiratory infections, and the importance of seeking medical treatment when having a respiratory infection. Flowsheet Row PULMONARY REHAB OTHER RESPIRATORY from 04/24/2022 in [facility]  Date 04/10/22  Educator HB  Instruction Review Code 1- Verbalizes Understanding       Stress I: Signs and Symptoms: - Discuss the causes of stress, how stress may lead to anxiety and depression, and ways to limit stress.   Stress II: Relaxation: -Discuss relaxation techniques to limit stress. Flowsheet Row PULMONARY REHAB OTHER RESPIRATORY from 04/24/2022 in [facility]  Date 04/24/22  Educator handout       Oxygen for Home/Travel: -  Discuss how to prepare for travel when on oxygen and proper ways to transport and store oxygen to ensure safety.   Knowledge Questionnaire Score:  Knowledge Questionnaire Score - 02/20/22 1256       Knowledge Questionnaire Score   Pre Score 17/18             Core Components/Risk Factors/Patient Goals at Admission:  Personal Goals and Risk Factors at Admission - 02/20/22 1322       Core Components/Risk Factors/Patient Goals on Admission    Weight Management Yes;Obesity;Weight Loss    Intervention Obesity: Provide education and appropriate resources to help participant work on and attain dietary goals.;Weight Management/Obesity: Establish reasonable short term and long term weight goals.;Weight Management: Provide education and appropriate resources to help participant work on and attain dietary goals.;Weight Management: Develop a combined nutrition and exercise program designed to reach desired caloric intake, while maintaining appropriate intake of nutrient and fiber, sodium and fats, and appropriate energy expenditure required for the weight goal.    Expected Outcomes Short Term: Continue to assess and modify  interventions until short term weight is achieved;Long Term: Adherence to nutrition and physical activity/exercise program aimed toward attainment of established weight goal;Weight Maintenance: Understanding of the daily nutrition guidelines, which includes 25-35% calories from fat, 7% or less cal from saturated fats, less than 200mg  cholesterol, less than 1.5gm of sodium, & 5 or more servings of fruits and vegetables daily;Weight Loss: Understanding of general recommendations for a balanced deficit meal plan, which promotes 1-2 lb weight loss per week and includes a negative energy balance of [id number] kcal/d;Understanding recommendations for meals to include 15-35% energy as protein, 25-35% energy from fat, 35-60% energy from carbohydrates, less than 200mg  of dietary cholesterol, 20-35 gm of total fiber daily;Understanding of distribution of calorie intake throughout the day with the consumption of 4-5 meals/snacks    Improve shortness of breath with ADL's Yes    Intervention Provide education, individualized exercise plan and daily activity instruction to help decrease symptoms of SOB with activities of daily living.    Expected Outcomes Short Term: Improve cardiorespiratory fitness to achieve a reduction of symptoms when performing ADLs;Long Term: Be able to perform more ADLs without symptoms or delay the onset of symptoms             Core Components/Risk Factors/Patient Goals Review:   Goals and Risk Factor Review     Row Name 02/26/22 1343 03/27/22 0750 04/23/22 0838         Core Components/Risk Factors/Patient Goals Review   Personal Goals Review Weight Management/Obesity;Other;Improve shortness of breath with ADL's Weight Management/Obesity;Other;Improve shortness of breath with ADL's Weight Management/Obesity;Other;Improve shortness of breath with ADL's     Review Patient was referred to PR with ILD. He has completed 1 session. His weight is 126.6 KG. He exercises on RA with O2 sats at  92-95%. His persoanl goals are to decrease his SOB with exertion and lose weight. We will continue to monitor his progress as he works towards meeting these goal. Patient has completed 7 session. His weight is 123.4 KG down 3.2 KG from last 30 day review. He continues to exercise on RA with O2 sats at 95-96% which has improved. His persoanl goals for the program are to decrease his SOB with exertion and lose weight. We will continue to monitor his progress as he works towards meeting these goal. Patient has completed 13 sessions.  His weight is 123.8 kg, down from his initial weight of 126.6 kg.  He continues to exercise on RA with 02 sats from 92-96%.  His personal goals for the program are to decrease his SOB with exertion and lose weight.  We will continue to monitor his progress as he works toward meeting these goals.     Expected Outcomes Patient will complete the program meeting both perosnal and program goals. Patient will complete the program meeting both perosnal and program goals. Patient will complete the program meeting both perosnal and program goals.              Core Components/Risk Factors/Patient Goals at Discharge (Final Review):   Goals and Risk Factor Review - 04/23/22 0838       Core Components/Risk Factors/Patient Goals Review   Personal Goals Review Weight Management/Obesity;Other;Improve shortness of breath with ADL's    Review Patient has completed 13 sessions.  His weight is 123.8 kg, down from his initial weight of 126.6 kg.  He continues to exercise on RA with 02 sats from 92-96%.  His personal goals for the program are to decrease his SOB with exertion and lose weight.  We will continue to monitor his progress as he works toward meeting these goals.    Expected Outcomes Patient will complete the program meeting both perosnal and program goals.             ITP Comments:   Comments: Marland KitchenMarland KitchenITP REVIEW Pt is making expected progress toward pulmonary rehab goals after  completing 16 sessions. Recommend continued exercise, life style modification, education, and utilization of breathing techniques to increase stamina and strength and decrease shortness of breath with exertion.

## 2022-05-01 ENCOUNTER — Encounter (HOSPITAL_COMMUNITY): Payer: Medicare HMO

## 2022-05-01 ENCOUNTER — Telehealth: Payer: Self-pay | Admitting: Pulmonary Disease

## 2022-05-01 DIAGNOSIS — Z5181 Encounter for therapeutic drug level monitoring: Secondary | ICD-10-CM

## 2022-05-01 NOTE — Telephone Encounter (Signed)
Spoke with patient, he advises he is experiencing a reaction from the cellcept. He started using the cellcept again Saturday after a break due to the last reaction he had. States he has a rash under his arms and a lot of gas.  Patient wants to know if he can get a prescription to treat the rash and help with stomach cramps.  Dr. Chase Caller please advise?   Pharmacy [facility] in [place]

## 2022-05-01 NOTE — Telephone Encounter (Signed)
Agree with holding CellCept.  Please make a sooner video visit in person visit with me to review.

## 2022-05-01 NOTE — Telephone Encounter (Signed)
Called and spoke with patient. He verbalized understanding. I was able to move up his appt to 4/24 with Dr. Vaughan Browner. While on the phone, he mentioned that wanted to have his labs done at [facility]. I advised him I would place the orders for them to be done at [facility]. He verbalized understanding.   Nothing further needed at time of call.

## 2022-05-01 NOTE — Telephone Encounter (Signed)
Praveen: your pateht are you able to advise 05/01/2022?  If not, triage he has to hold his cellcept til Dr Vaughan Browner can advise

## 2022-05-01 NOTE — Telephone Encounter (Signed)
April 26 is fine for follow-up.

## 2022-05-01 NOTE — Telephone Encounter (Signed)
Dr. Vaughan Browner you dont have a opening until April 26th. Is patient okay to wait till then or should I get him scheduled with a APP

## 2022-05-01 NOTE — Telephone Encounter (Signed)
Pt. Having allergic reaction to medication and needs medical advise

## 2022-05-04 DIAGNOSIS — I10 Essential (primary) hypertension: Secondary | ICD-10-CM | POA: Diagnosis not present

## 2022-05-04 DIAGNOSIS — E782 Mixed hyperlipidemia: Secondary | ICD-10-CM | POA: Diagnosis not present

## 2022-05-06 ENCOUNTER — Encounter (HOSPITAL_COMMUNITY)
Admission: RE | Admit: 2022-05-06 | Discharge: 2022-05-06 | Disposition: A | Discharge status: Home / Self Care | Payer: Medicare HMO | Source: Ambulatory Visit | Attending: Pulmonary Disease | Admitting: Pulmonary Disease

## 2022-05-06 DIAGNOSIS — Z5181 Encounter for therapeutic drug level monitoring: Secondary | ICD-10-CM | POA: Diagnosis not present

## 2022-05-06 DIAGNOSIS — J849 Interstitial pulmonary disease, unspecified: Secondary | ICD-10-CM | POA: Diagnosis not present

## 2022-05-06 NOTE — Progress Notes (Signed)
Daily Session Note  Patient Details  Name: ROBERTJAMES EISEL MRN: [id number] Date of Birth: [birth year] Referring Provider:   Flowsheet Row PULMONARY REHAB OTHER RESP ORIENTATION from 02/20/2022 in [place]  Referring Provider Dr. Vaughan Browner       Encounter Date: 05/06/2022  Check In:  Session Check In - 05/06/22 1045       Check-In   Supervising physician immediately available to respond to emergencies [facility] MD immediately available    Physician(s) Dr Dellia Cloud    Location [facility]    Staff Present Leana Roe, BS, Exercise Physiologist;Annisha Baar Hassell Done, RN, BSN;Dalton Fletcher MHA, MS, ACSM-CEP    Virtual Visit No    Medication changes reported     No    Fall or balance concerns reported    No    Tobacco Cessation No Change    Warm-up and Cool-down Performed as group-led instruction    Resistance Training Performed Yes    VAD Patient? No    PAD/SET Patient? No      Pain Assessment   Currently in Pain? No/denies    Pain Score 0-No pain    Multiple Pain Sites No             Capillary Blood Glucose: No results found for this or any previous visit (from the past 24 hour(s)).    Social History   Tobacco Use  Smoking Status Former   Packs/day: 2.00   Years: 11.00   Additional pack years: 0.00   Total pack years: 22.00   Types: Cigarettes   Start date: 05/22/1962   Quit date: 05/21/1973   Years since quitting: 48.9  Smokeless Tobacco Never  Tobacco Comments   Quit 25 yrs now     Goals Met:  Proper associated with RPD/PD & O2 Sat Independence with exercise equipment Using PLB without cueing & demonstrates good technique Exercise tolerated well Queuing for purse lip breathing No report of concerns or symptoms today Strength training completed today  Goals Unmet:  Not Applicable  Comments: Checkout at 1145.   Dr. Kathie Dike is Medical Director for [facility].

## 2022-05-08 ENCOUNTER — Encounter (HOSPITAL_COMMUNITY)
Admission: RE | Admit: 2022-05-08 | Discharge: 2022-05-08 | Disposition: A | Discharge status: Home / Self Care | Payer: Medicare HMO | Source: Ambulatory Visit | Attending: Pulmonary Disease | Admitting: Pulmonary Disease

## 2022-05-08 DIAGNOSIS — Z5181 Encounter for therapeutic drug level monitoring: Secondary | ICD-10-CM | POA: Diagnosis not present

## 2022-05-08 DIAGNOSIS — J849 Interstitial pulmonary disease, unspecified: Secondary | ICD-10-CM | POA: Diagnosis not present

## 2022-05-08 NOTE — Progress Notes (Signed)
Daily Session Note  Patient Details  Name: [place] Nunez MRN: [id number] Date of Birth: 08/03/1945 Referring Provider:   Flowsheet Row PULMONARY REHAB OTHER RESP ORIENTATION from 02/20/2022 in [facility]  Referring Provider Dr. Vaughan Browner       Encounter Date: 05/08/2022  Check In:  Session Check In - 05/08/22 1045       Check-In   Supervising physician immediately available to respond to emergencies [facility] MD immediately available    Physician(s) Dr Dellia Cloud    Location [facility]    Staff Present Leana Roe, BS, Exercise Physiologist;Dalton Sherrie George, MS, ACSM-CEP;Melven Sartorius BSN, RN    Virtual Visit No    Medication changes reported     No    Comments per pt, he is still off Cellcept until he sees his physician in May.    Fall or balance concerns reported    No    Tobacco Cessation No Change    Warm-up and Cool-down Performed as group-led instruction    Resistance Training Performed Yes    VAD Patient? No    PAD/SET Patient? No      Pain Assessment   Currently in Pain? No/denies    Pain Score 0-No pain    Multiple Pain Sites No             Capillary Blood Glucose: No results found for this or any previous visit (from the past 24 hour(s)).    Social History   Tobacco Use  Smoking Status Former   Packs/day: 2.00   Years: 11.00   Additional pack years: 0.00   Total pack years: 22.00   Types: Cigarettes   Start date: 05/22/1962   Quit date: 05/21/1973   Years since quitting: 48.9  Smokeless Tobacco Never  Tobacco Comments   Quit 25 yrs now     Goals Met:  Proper associated with RPD/PD & O2 Sat Independence with exercise equipment Using PLB without cueing & demonstrates good technique Exercise tolerated well Queuing for purse lip breathing No report of concerns or symptoms today Strength training completed today  Goals Unmet:  Not Applicable  Comments: check out at 11:45   Dr. Kathie Dike is  Medical Director for [facility] Pulmonary Re[facility].

## 2022-05-13 ENCOUNTER — Other Ambulatory Visit (HOSPITAL_COMMUNITY)
Admission: RE | Admit: 2022-05-13 | Discharge: 2022-05-13 | Disposition: A | Discharge status: Home / Self Care | Payer: Medicare HMO | Source: Ambulatory Visit | Attending: Pulmonary Disease | Admitting: Pulmonary Disease

## 2022-05-13 ENCOUNTER — Encounter (HOSPITAL_COMMUNITY)
Admission: RE | Admit: 2022-05-13 | Discharge: 2022-05-13 | Disposition: A | Discharge status: Home / Self Care | Payer: Medicare HMO | Source: Ambulatory Visit | Attending: Pulmonary Disease | Admitting: Pulmonary Disease

## 2022-05-13 VITALS — Wt 275.6 lb

## 2022-05-13 DIAGNOSIS — Z5181 Encounter for therapeutic drug level monitoring: Secondary | ICD-10-CM | POA: Insufficient documentation

## 2022-05-13 DIAGNOSIS — J849 Interstitial pulmonary disease, unspecified: Secondary | ICD-10-CM

## 2022-05-13 LAB — COMPREHENSIVE METABOLIC PANEL
ALT: 25 U/L (ref 0–44)
AST: 24 U/L (ref 15–41)
Albumin: 3.7 g/dL (ref 3.5–5.0)
Alkaline Phosphatase: 67 U/L (ref 38–126)
Anion gap: 6 (ref 5–15)
BUN: 16 mg/dL (ref 8–23)
CO2: 27 mmol/L (ref 22–32)
Calcium: 8.9 mg/dL (ref 8.9–10.3)
Chloride: 104 mmol/L (ref 98–111)
Creatinine, Ser: 1.13 mg/dL (ref 0.61–1.24)
GFR, Estimated: >60 mL/min (ref >60)
Glucose, Bld: 84 mg/dL (ref 70–99)
Potassium: 4.1 mmol/L (ref 3.5–5.1)
Sodium: 137 mmol/L (ref 135–145)
Total Bilirubin: 1.1 mg/dL (ref 0.3–1.2)
Total Protein: 6.9 g/dL (ref 6.5–8.1)

## 2022-05-13 LAB — CBC WITH DIFFERENTIAL/PLATELET
Abs Immature Granulocytes: 0.05 10*3/uL (ref 0.00–0.07)
Basophils Absolute: 0.1 10*3/uL (ref 0.0–0.1)
Basophils Relative: 1 %
Eosinophils Absolute: 0.5 10*3/uL (ref 0.0–0.5)
Eosinophils Relative: 5 %
HCT: 39.1 % (ref 39.0–52.0)
Hemoglobin: 13.4 g/dL (ref 13.0–17.0)
Immature Granulocytes: 1 %
Lymphocytes Relative: 28 %
Lymphs Abs: 2.9 10*3/uL (ref 0.7–4.0)
MCH: 32.2 pg (ref 26.0–34.0)
MCHC: 34.3 g/dL (ref 30.0–36.0)
MCV: 94 fL (ref 80.0–100.0)
Monocytes Absolute: 1 10*3/uL (ref 0.1–1.0)
Monocytes Relative: 10 %
Neutro Abs: 5.6 10*3/uL (ref 1.7–7.7)
Neutrophils Relative %: 55 %
Platelets: 256 10*3/uL (ref 150–400)
RBC: 4.16 MIL/uL — ABNORMAL LOW (ref 4.22–5.81)
RDW: 14.9 % (ref 11.5–15.5)
WBC: 10.1 10*3/uL (ref 4.0–10.5)
nRBC: 0 % (ref 0.0–0.2)

## 2022-05-13 NOTE — Progress Notes (Signed)
Daily Session Note  Patient Details  Name: [place] Nunez MRN: [id number] Date of Birth: Oct 01, 1945 Referring Provider:   Flowsheet Row PULMONARY REHAB OTHER RESP ORIENTATION from 02/20/2022 in [facility] CARDIAC REHABILITATION  Referring Provider Dr. Isaiah Serge       Encounter Date: 05/13/2022  Check In:  Session Check In - 05/13/22 1045       Check-In   Supervising physician immediately available to respond to emergencies [facility] MD immediately available    Physician(s) Dr Wyline Mood    Location [facility]    Staff Present Ross Ludwig, BS, Exercise Physiologist;Tyeisha Dinan Daphine Deutscher, RN, BSN    Virtual Visit No    Medication changes reported     No    Fall or balance concerns reported    No    Tobacco Cessation No Change    Warm-up and Cool-down Performed as group-led instruction    Resistance Training Performed Yes    VAD Patient? No    PAD/SET Patient? No      Pain Assessment   Currently in Pain? No/denies    Pain Score 0-No pain    Multiple Pain Sites No             Capillary Blood Glucose: No results found for this or any previous visit (from the past 24 hour(s)).    Social History   Tobacco Use  Smoking Status Former   Packs/day: 2.00   Years: 11.00   Additional pack years: 0.00   Total pack years: 22.00   Types: Cigarettes   Start date: 05/22/1962   Quit date: 05/21/1973   Years since quitting: 49.0  Smokeless Tobacco Never  Tobacco Comments   Quit 25 yrs now     Goals Met:  Proper associated with RPD/PD & O2 Sat Independence with exercise equipment Using PLB without cueing & demonstrates good technique Exercise tolerated well Queuing for purse lip breathing No report of concerns or symptoms today Strength training completed today  Goals Unmet:  Not Applicable  Comments: checkout at 1145.   Dr. Erick Blinks is Medical Director for [facility] Pulmonary Rehab.

## 2022-05-15 ENCOUNTER — Encounter (HOSPITAL_COMMUNITY)
Admission: RE | Admit: 2022-05-15 | Discharge: 2022-05-15 | Disposition: A | Discharge status: Home / Self Care | Payer: Medicare HMO | Source: Ambulatory Visit | Attending: Pulmonary Disease | Admitting: Pulmonary Disease

## 2022-05-15 DIAGNOSIS — Z5181 Encounter for therapeutic drug level monitoring: Secondary | ICD-10-CM

## 2022-05-15 DIAGNOSIS — J849 Interstitial pulmonary disease, unspecified: Secondary | ICD-10-CM | POA: Diagnosis not present

## 2022-05-15 NOTE — Progress Notes (Signed)
Daily Session Note  Patient Details  Name: [place] Nunez MRN: [id number] Date of Birth: 01/15/1946 Referring Provider:   Flowsheet Row PULMONARY REHAB OTHER RESP ORIENTATION from 02/20/2022 in [facility] CARDIAC REHABILITATION  Referring Provider Dr. Isaiah Serge       Encounter Date: 05/15/2022  Check In:  Session Check In - 05/15/22 1045       Check-In   Supervising physician immediately available to respond to emergencies [facility] MD immediately available    Physician(s) Dr Wyline Mood    Location [facility]    Staff Present Ross Ludwig, BS, Exercise Physiologist;Timmy Bubeck Daphine Deutscher, RN, Neal Dy, RN, BSN    Virtual Visit No    Medication changes reported     No    Fall or balance concerns reported    No    Tobacco Cessation No Change    Warm-up and Cool-down Performed as group-led instruction    Resistance Training Performed Yes      Pain Assessment   Currently in Pain? Yes    Pain Score 8     Pain Location Hip   Pain increased, cleaned carpet yesterday   Pain Orientation Left    Pain Descriptors / Indicators Aching    Pain Type Chronic pain    Multiple Pain Sites No             Capillary Blood Glucose: No results found for this or any previous visit (from the past 24 hour(s)).    Social History   Tobacco Use  Smoking Status Former   Packs/day: 2.00   Years: 11.00   Additional pack years: 0.00   Total pack years: 22.00   Types: Cigarettes   Start date: 05/22/1962   Quit date: 05/21/1973   Years since quitting: 49.0  Smokeless Tobacco Never  Tobacco Comments   Quit 25 yrs now     Goals Met:  Proper associated with RPD/PD & O2 Sat Independence with exercise equipment Using PLB without cueing & demonstrates good technique Exercise tolerated well Queuing for purse lip breathing No report of concerns or symptoms today Strength training completed today  Goals Unmet:  Not Applicable  Comments: Checkout at 1145.   Dr. Erick Blinks  is Medical Director for [facility] Pulmonary Rehab.

## 2022-05-20 ENCOUNTER — Encounter (HOSPITAL_COMMUNITY): Payer: Medicare HMO

## 2022-05-20 DIAGNOSIS — L218 Other seborrheic dermatitis: Secondary | ICD-10-CM | POA: Diagnosis not present

## 2022-05-20 DIAGNOSIS — L2089 Other atopic dermatitis: Secondary | ICD-10-CM | POA: Diagnosis not present

## 2022-05-22 ENCOUNTER — Encounter (HOSPITAL_COMMUNITY): Payer: Medicare HMO

## 2022-05-23 DIAGNOSIS — I10 Essential (primary) hypertension: Secondary | ICD-10-CM | POA: Diagnosis not present

## 2022-05-23 DIAGNOSIS — G4733 Obstructive sleep apnea (adult) (pediatric): Secondary | ICD-10-CM | POA: Diagnosis not present

## 2022-05-23 DIAGNOSIS — R69 Illness, unspecified: Secondary | ICD-10-CM | POA: Diagnosis not present

## 2022-05-27 ENCOUNTER — Encounter (HOSPITAL_COMMUNITY)
Admission: RE | Admit: 2022-05-27 | Discharge: 2022-05-27 | Disposition: A | Discharge status: Home / Self Care | Payer: Medicare HMO | Source: Ambulatory Visit | Attending: Pulmonary Disease | Admitting: Pulmonary Disease

## 2022-05-27 VITALS — Wt 271.2 lb

## 2022-05-27 DIAGNOSIS — Z5181 Encounter for therapeutic drug level monitoring: Secondary | ICD-10-CM

## 2022-05-27 DIAGNOSIS — J849 Interstitial pulmonary disease, unspecified: Secondary | ICD-10-CM

## 2022-05-27 NOTE — Progress Notes (Signed)
Daily Session Note  Patient Details  Name: [place] Nunez MRN: [id number] Date of Birth: 17-Nov-1945 Referring Provider:   Flowsheet Row PULMONARY REHAB OTHER RESP ORIENTATION from 02/20/2022 in [facility] CARDIAC REHABILITATION  Referring Provider Dr. Isaiah Serge       Encounter Date: 05/27/2022  Check In:  Session Check In - 05/27/22 1045       Check-In   Supervising physician immediately available to respond to emergencies [facility] MD immediately available    Physician(s) Dr Jenene Slicker    Location [facility]    Staff Present Ritchard Paragas Daphine Deutscher, RN, BSN;Other   Kary Kos, EP   Virtual Visit No    Fall or balance concerns reported    No    Tobacco Cessation No Change    Warm-up and Cool-down Performed as group-led instruction    Resistance Training Performed Yes      Pain Assessment   Currently in Pain? No/denies    Pain Score 0-No pain    Multiple Pain Sites No             Capillary Blood Glucose: No results found for this or any previous visit (from the past 24 hour(s)).    Social History   Tobacco Use  Smoking Status Former   Packs/day: 2.00   Years: 11.00   Additional pack years: 0.00   Total pack years: 22.00   Types: Cigarettes   Start date: 05/22/1962   Quit date: 05/21/1973   Years since quitting: 49.0  Smokeless Tobacco Never  Tobacco Comments   Quit 25 yrs now     Goals Met:  Proper associated with RPD/PD & O2 Sat Independence with exercise equipment Using PLB without cueing & demonstrates good technique Exercise tolerated well Queuing for purse lip breathing No report of concerns or symptoms today Strength training completed today  Goals Unmet:  Not Applicable  Comments: Checkout at 1145.   Dr. Erick Blinks is Medical Director for [facility] Pulmonary Rehab.

## 2022-05-28 ENCOUNTER — Encounter: Payer: Self-pay | Admitting: Pulmonary Disease

## 2022-05-28 ENCOUNTER — Ambulatory Visit: Payer: Medicare HMO | Admitting: Pulmonary Disease

## 2022-05-28 VITALS — BP 136/80 | HR 61 | Temp 98.1°F | Ht 71.0 in | Wt 272.0 lb

## 2022-05-28 DIAGNOSIS — J849 Interstitial pulmonary disease, unspecified: Secondary | ICD-10-CM | POA: Diagnosis not present

## 2022-05-28 DIAGNOSIS — Z5181 Encounter for therapeutic drug level monitoring: Secondary | ICD-10-CM

## 2022-05-28 DIAGNOSIS — G4733 Obstructive sleep apnea (adult) (pediatric): Secondary | ICD-10-CM | POA: Diagnosis not present

## 2022-05-28 MED ORDER — MYCOPHENOLATE SODIUM 360 MG PO TBEC: 360.0000 mg | DELAYED_RELEASE_TABLET | Freq: Two times a day (BID) | ORAL | 0 refills | Status: AC

## 2022-05-28 NOTE — Patient Instructions (Signed)
Stop taking the CellCept.  Will start you on an alternate medication called Mycorific twice daily Return to clinic in 2 months with either video visit or in person visit Will get you scheduled for high-res CT and PFTs in 6 months

## 2022-05-28 NOTE — Progress Notes (Signed)
[place] Nunez    [id number]    [birth year]  Primary Care Physician:Fusco, Lyman Bishop, MD  Referring Physician: Elfredia Nevins, MD [address] [place],  [place] [address]  Chief complaint: Follow-up for ILD, pulmonary fibrosis  HPI: 77 y.o. with history of hypertension, hyperlipidemia, sleep apnea, psoriasis  2020 Had an incidental finding of interstitial lung disease on CT abdomen which was done for diverticulitis He has seen Dr. Shaune Pollack at [facility] who did a high resolution CT with findings of alternate diagnosis, possible HP.  He had HP panel on 12/28/2018 which is negative. History notable for psoriasis with skin involvement.  He was on methotrexate for a couple of years and stopped as his skin rash improved.  Last dose of methotrexate was several years ago.  2021 Had COVID-19 infection December 2021, referred for monoclonal antibody  2023 Discussed at multidisciplinary conference on 10/15/2021 CTs show progressive interstitial lung disease and alternate pattern. Differential diagnoses include chronic HP versus NSIP.  Recommend a biopsy and would favor surgical lung biopsy as transbronchial and BAL may not give a definitive diagnosis. MDC see also recommends empiric treatment with antifibrotic's +/- anti-inflammatory if pathology is suggestive.  Pets: Has a dog.  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  His hobbies include gardening but no significant exposure to mold,No hot tub, Jacuzzi, no down pillows or comforters.  He uses talcum powder every day.  May have had Freon leak from his HVAC system. Likes to play golf.   Smoking history: 30-pack-year smoker.  Quit in 1980 Travel history: No significant travel history Relevant family history: No significant family history  of lung disease.  Interval history: [place] Nune[place] in first week of Jan 2023.  He is tolerating it well so far with no issue Has started pulmonary rehab and is doing well States that breathing is better.  Started on CellCept in February 2024.  He developed rash on his skin immediately after.  He was treated with antifungal cream and discontinuation of CellCept with improvement in symptoms.  The symptoms recurred after he rechallenge himself with CellCept and is now completely off the medication.  Outpatient Encounter Medications as of 05/28/2022  Medication Sig   albuterol (VENTOLIN HFA) 108 (90 Base) MCG/ACT inhaler Inhale 1-2 puffs into the lungs every 4 (four) hours as needed for shortness of breath or wheezing.   ALPRAZolam (XANAX) 0.5 MG tablet Take 0.25-0.5 mg by mouth 3 (three) times daily as needed for sleep.   Azelastine HCl 137 MCG/SPRAY SOLN PLACE 2 SPRAYS INTO BOTH NOSTRILS 2 TIMES DAILY AS NEEDED FOR RHINITIS.   doxazosin (CARDURA) 4 MG tablet Take 4 mg by mouth at bedtime.     fluticasone (FLONASE) 50 MCG/ACT nasal spray Place 2 sprays into both nostrils daily.   losartan (COZAAR) 25 MG tablet Take 25 mg by mouth daily.   metoprolol tartrate (LOPRESSOR) 25 MG tablet Take 25 mg by mouth 2 (two) times daily.   mycophenolate (CELLCEPT) 500 MG tablet Take 1 tablet (500 mg total) by mouth 2 (two) times daily.   Nintedanib (OFEV) 150 MG CAPS Take 1 capsule (150 mg total) by mouth 2 (two) times daily.   pantoprazole (PROTONIX) 40 MG tablet Take 40 mg by mouth daily.   pravastatin (PRAVACHOL) 20 MG tablet Take 20 mg by  mouth every evening.    [DISCONTINUED] escitalopram (LEXAPRO) 5 MG tablet Take 5 mg by mouth daily.   No facility-administered encounter medications on file as of 05/28/2022.   Physical Exam: Blood pressure 136/80, pulse 61, temperature 98.1 F (36.7 C), temperature source Oral, height 5\' 11"  (1.803 m), weight 272 lb (123.4 kg), SpO2 96 %. Gen:      No acute  distress HEENT:  EOMI, sclera anicteric Neck:     No masses; no thyromegaly Lungs:    Clear to auscultation bilaterally; normal respiratory effort CV:         Regular rate and rhythm; no murmurs Abd:      + bowel sounds; soft, non-tender; no palpable masses, no distension Ext:    No edema; adequate peripheral perfusion Skin:      Warm and dry; no rash Neuro: alert and oriented x 3 Psych: normal mood and affect   Data Reviewed: Imaging: High-resolution CT 12/24/2018-mild emphysema, traction bronchiectasis, patchy groundglass and reticulation.  More prominent in the upper lobes with no basal gradient.  Air-trapping present. High-res CT 09/19/2019-stable to minimally changed interstitial lung disease in alternate pattern  CT 03/25/2021-interstitial lung disease with superimposed groundglass opacities in the right upper and left lower lobe.  High-resolution CT 09/12/2021-upper lobe predominant fibrotic interstitial lung disease and alternate pattern. I have reviewed the images personally.  PFTs: 01/06/2019 FVC 3 [66%], FEV1 2.61 [78%], F/F 87, TLC 4.33 [59%], DLCO 22.56 [85%] Severe restriction with no obstruction.  Diffusion capacity is normal  10/03/2019 FVC 2.90 [64%], FEV1 2.47 [94%], DLCO 21.35 [81%] Moderate restriction  08/29/2020 FVC 2.14 [1%], FEV1 2.42 [74%], TLC 4.55 [62%], DLCO 19.75 [75%] Moderate restriction, mild diffusion defect  01/08/2022 FVC 2.29 [52%], FEV1 2.05 [64%], F/F89, TLC 3.70 [51%], DLCO 14.84 [57%]   Labs: Hypersensitivity panel 12/28/2018- Negative CTD profile 02/10/2019- Negative  Sleep: Home sleep study 08/03/2021 Moderate OSA with AHI 28.6, low O2 sat of 81%.  Assessment:  Interstitial lung disease CT scan reviewed with alternate pattern of pulmonary fibrosis with upper lobe predominance.  This could be hypersensitivity pneumonitis though he does not have any significant exposures  Lab work including CTD serologies, hypersensitivity panel is  negative.  Has remote exposure to asbestos but CT scan is not typical of asbestosis.  His CT may show slight progression.  We had multiple discussions including today for further work-up in detail with patient, including wait and watch with monitoring, bronchoscope with BAL, transbronchial biopsies or surgical lung biopsy.  He is very reluctant to undergo an invasive procedure. Per multidisciplinary conference recommendations for biopsy, treatment with antifibrotic's +/- anti-inflammatory.  After long discussion with patient and family we have decided to start treatment with antifibrotic's.   He is tolerating Ofev well so far.  CellCept tried earlier this year but developed a rash.  Will try Mycorific to see if this is better tolerated.  If he continues to have symptoms then we may need to try azathioprine Check baseline labs including CBC, TPMT in case he needs azathioprine and metabolic panel for liver monitoring.  Continue pulmonary rehab  OSA He has diagnosis of OSA 30 years ago.  On auto CPAP. CPAP download reviewed with good compliance and response with residual AHI of 2.2  Obesity He has weight gain recently which is suspect is contributing to his restriction and dyspnea Advised weight loss with diet and exercise  Plan/Recommendations: - Check  labs for monitoring - Continue Ofev start by Mycorific - Pulmonary rehab  Xara Paulding  Adeleigh Barletta MD [facility] 05/28/2022, 1:46 PM  CC: Elfredia Nevins, MD

## 2022-05-29 ENCOUNTER — Encounter (HOSPITAL_COMMUNITY)
Admission: RE | Admit: 2022-05-29 | Discharge: 2022-05-29 | Disposition: A | Discharge status: Home / Self Care | Payer: Medicare HMO | Source: Ambulatory Visit | Attending: Pulmonary Disease | Admitting: Pulmonary Disease

## 2022-05-29 DIAGNOSIS — J849 Interstitial pulmonary disease, unspecified: Secondary | ICD-10-CM | POA: Diagnosis not present

## 2022-05-29 DIAGNOSIS — Z5181 Encounter for therapeutic drug level monitoring: Secondary | ICD-10-CM | POA: Diagnosis not present

## 2022-05-29 NOTE — Progress Notes (Signed)
Daily Session Note  Patient Details  Name: [place] Nunez MRN: [id number] Date of Birth: 08-Mar-1945 Referring Provider:   Flowsheet Row PULMONARY REHAB OTHER RESP ORIENTATION from 02/20/2022 in [facility] CARDIAC REHABILITATION  Referring Provider Dr. Isaiah Serge       Encounter Date: 05/29/2022  Check In:  Session Check In - 05/29/22 1045       Check-In   Supervising physician immediately available to respond to emergencies [facility] MD immediately available    Physician(s) Dr Jenene Slicker    Location [facility]    Staff Present Ross Ludwig, BS, Exercise Physiologist;Other    Virtual Visit No    Medication changes reported     No    Fall or balance concerns reported    No    Tobacco Cessation No Change    Warm-up and Cool-down Performed as group-led instruction    Resistance Training Performed Yes    VAD Patient? No    PAD/SET Patient? No      Pain Assessment   Currently in Pain? No/denies    Pain Score 0-No pain    Multiple Pain Sites No             Capillary Blood Glucose: No results found for this or any previous visit (from the past 24 hour(s)).    Social History   Tobacco Use  Smoking Status Former   Packs/day: 2.00   Years: 11.00   Additional pack years: 0.00   Total pack years: 22.00   Types: Cigarettes   Start date: 05/22/1962   Quit date: 05/21/1973   Years since quitting: 49.0  Smokeless Tobacco Never  Tobacco Comments   Quit 25 yrs now     Goals Met:  Independence with exercise equipment Exercise tolerated well Queuing for purse lip breathing No report of concerns or symptoms today Strength training completed today  Goals Unmet:  Not Applicable  Comments: checlk out 1145   Dr. Erick Blinks is Medical Director for [facility] Pulmonary Rehab.

## 2022-05-29 NOTE — Progress Notes (Signed)
Pulmonary Individual Treatment Plan  Patient Details  Name: Alexander Nunez MRN: [id number] Date of Birth: [birth year] Referring Provider:   Flowsheet Row PULMONARY REHAB OTHER RESP ORIENTATION from 02/20/2022 in [facility] CARDIAC REHABILITATION  Referring Provider Dr. Isaiah Serge       Initial Encounter Date:  Flowsheet Row PULMONARY REHAB OTHER RESP ORIENTATION from 02/20/2022 in [facility] CARDIAC REHABILITATION  Date 02/20/22       Visit Diagnosis: Interstitial lung disease  Therapeutic drug monitoring  Patient's Home Medications on Admission:   Current Outpatient Medications:    albuterol (VENTOLIN HFA) 108 (90 Base) MCG/ACT inhaler, Inhale 1-2 puffs into the lungs every 4 (four) hours as needed for shortness of breath or wheezing., Disp: 6.7 g, Rfl: 5   ALPRAZolam (XANAX) 0.5 MG tablet, Take 0.25-0.5 mg by mouth 3 (three) times daily as needed for sleep., Disp: , Rfl:    Azelastine HCl 137 MCG/SPRAY SOLN, PLACE 2 SPRAYS INTO BOTH NOSTRILS 2 TIMES DAILY AS NEEDED FOR RHINITIS., Disp: 90 mL, Rfl: 1   doxazosin (CARDURA) 4 MG tablet, Take 4 mg by mouth at bedtime.  , Disp: , Rfl:    fluticasone (FLONASE) 50 MCG/ACT nasal spray, Place 2 sprays into both nostrils daily., Disp: 16 g, Rfl: 11   losartan (COZAAR) 25 MG tablet, Take 25 mg by mouth daily., Disp: , Rfl:    metoprolol tartrate (LOPRESSOR) 25 MG tablet, Take 25 mg by mouth 2 (two) times daily., Disp: , Rfl:    mycophenolate (MYFORTIC) 360 MG TBEC EC tablet, Take 1 tablet (360 mg total) by mouth 2 (two) times daily., Disp: 60 tablet, Rfl: 0   Nintedanib (OFEV) 150 MG CAPS, Take 1 capsule (150 mg total) by mouth 2 (two) times daily., Disp: 90 capsule, Rfl: 0   pantoprazole (PROTONIX) 40 MG tablet, Take 40 mg by mouth daily., Disp: , Rfl:    pravastatin (PRAVACHOL) 20 MG tablet, Take 20 mg by mouth every evening. , Disp: , Rfl:   Past Medical History: Past Medical History:  Diagnosis Date   Anxiety    Arthritis    back    Back pain, chronic    Colon polyp    Diverticulitis    GERD (gastroesophageal reflux disease)    Heart palpitations    High cholesterol    Hypertension    Irritable bowel syndrome    Osteoporosis    Psoriasis    Pulmonary fibrosis    Sleep apnea    c- pap    Tobacco Use: Social History   Tobacco Use  Smoking Status Former   Packs/day: 2.00   Years: 11.00   Additional pack years: 0.00   Total pack years: 22.00   Types: Cigarettes   Start date: 05/22/1962   Quit date: 05/21/1973   Years since quitting: 49.0  Smokeless Tobacco Never  Tobacco Comments   Quit 25 yrs now     Labs: Review Flowsheet       Latest Ref Rng & Units 03/26/2021  Labs for ITP Cardiac and Pulmonary Rehab  Cholestrol 0 - 200 mg/dL 409   LDL (calc) 0 - 99 mg/dL 83   HDL-C >81 mg/dL 43   Trlycerides <191 mg/dL 71     Capillary Blood Glucose: No results found for: "GLUCAP"   Pulmonary Assessment Scores:  Pulmonary Assessment Scores     Row Name 02/20/22 1253         ADL UCSD   ADL Phase Entry     SOB Score  total 45     Rest 0     Walk 2     Stairs 3     Bath 1     Dress 2     Shop 2       CAT Score   CAT Score 16       mMRC Score   mMRC Score 1             UCSD: Self-administered rating of dyspnea associated with activities of daily living (ADLs) 6-point scale (0 = "not at all" to 5 = "maximal or unable to do because of breathlessness")  Scoring Scores range from 0 to 120.  Minimally important difference is 5 units  CAT: CAT can identify the health impairment of COPD patients and is better correlated with disease progression.  CAT has a scoring range of zero to 40. The CAT score is classified into four groups of low (less than 10), medium (10 - 20), high (21-30) and very high (31-40) based on the impact level of disease on health status. A CAT score over 10 suggests significant symptoms.  A worsening CAT score could be explained by an exacerbation, poor medication  adherence, poor inhaler technique, or progression of COPD or comorbid conditions.  CAT MCID is 2 points  mMRC: mMRC (Modified Medical Research Council) Dyspnea Scale is used to assess the degree of baseline functional disability in patients of respiratory disease due to dyspnea. No minimal important difference is established. A decrease in score of 1 point or greater is considered a positive change.   Pulmonary Function Assessment:   Exercise Target Goals: Exercise Program Goal: Individual exercise prescription set using results from initial 6 min walk test and THRR while considering  patient's activity barriers and safety.   Exercise Prescription Goal: Initial exercise prescription builds to 30-45 minutes a day of aerobic activity, 2-3 days per week.  Home exercise guidelines will be given to patient during program as part of exercise prescription that the participant will acknowledge.  Activity Barriers & Risk Stratification:  Activity Barriers & Cardiac Risk Stratification - 02/20/22 1313       Activity Barriers & Cardiac Risk Stratification   Activity Barriers Joint Problems    Cardiac Risk Stratification High             6 Minute Walk:  6 Minute Walk     Row Name 02/20/22 1317         6 Minute Walk   Phase Initial     Distance 1350 feet     Walk Time 6 minutes     # of Rest Breaks 0     MPH 2.55     METS 2.41     RPE 13     Perceived Dyspnea  12     VO2 Peak 8.46     Symptoms No     Resting HR 73 bpm     Resting BP 128/68     Resting Oxygen Saturation  96 %     Exercise Oxygen Saturation  during 6 min walk 91 %     Max Ex. HR 116 bpm     Max Ex. BP 160/70     2 Minute Post BP 130/68       Interval HR   1 Minute HR 73     2 Minute HR 73     3 Minute HR 73     4 Minute HR 80     5 Minute  HR 116     6 Minute HR 116     2 Minute Post HR 76     Interval Heart Rate? Yes       Interval Oxygen   Interval Oxygen? Yes     Baseline Oxygen Saturation % 96  %     1 Minute Oxygen Saturation % 95 %     1 Minute Liters of Oxygen 0 L     2 Minute Oxygen Saturation % 92 %     2 Minute Liters of Oxygen 0 L     3 Minute Oxygen Saturation % 91 %     3 Minute Liters of Oxygen 0 L     4 Minute Oxygen Saturation % 93 %     4 Minute Liters of Oxygen 0 L     5 Minute Oxygen Saturation % 93 %     5 Minute Liters of Oxygen 0 L     6 Minute Oxygen Saturation % 91 %     6 Minute Liters of Oxygen 0 L     2 Minute Post Oxygen Saturation % 96 %     2 Minute Post Liters of Oxygen 0 L              Oxygen Initial Assessment:  Oxygen Initial Assessment - 02/20/22 1252       Home Oxygen   Home Oxygen Device None    Sleep Oxygen Prescription CPAP    Liters per minute 0    Home Exercise Oxygen Prescription None    Home Resting Oxygen Prescription None    Compliance with Home Oxygen Use Yes      Initial 6 min Walk   Oxygen Used None      Program Oxygen Prescription   Program Oxygen Prescription None      Intervention   Short Term Goals To learn and understand importance of monitoring SPO2 with pulse oximeter and demonstrate accurate use of the pulse oximeter.;To learn and understand importance of maintaining oxygen saturations>88%;To learn and demonstrate proper pursed lip breathing techniques or other breathing techniques.     Long  Term Goals Verbalizes importance of monitoring SPO2 with pulse oximeter and return demonstration;Maintenance of O2 saturations>88%;Exhibits proper breathing techniques, such as pursed lip breathing or other method taught during program session;Compliance with respiratory medication             Oxygen Re-Evaluation:  Oxygen Re-Evaluation     Row Name 04/01/22 1310 04/29/22 1315 05/28/22 1231         Program Oxygen Prescription   Program Oxygen Prescription None None None       Home Oxygen   Home Oxygen Device None None None     Sleep Oxygen Prescription CPAP CPAP CPAP     Liters per minute 0 0 0     Home  Exercise Oxygen Prescription None None None     Home Resting Oxygen Prescription None None None     Compliance with Home Oxygen Use Yes Yes Yes       Goals/Expected Outcomes   Short Term Goals To learn and understand importance of monitoring SPO2 with pulse oximeter and demonstrate accurate use of the pulse oximeter.;To learn and understand importance of maintaining oxygen saturations>88%;To learn and demonstrate proper pursed lip breathing techniques or other breathing techniques.  To learn and understand importance of monitoring SPO2 with pulse oximeter and demonstrate accurate use of the pulse oximeter.;To learn and understand importance of maintaining oxygen saturations>88%;To  learn and demonstrate proper pursed lip breathing techniques or other breathing techniques.  To learn and understand importance of monitoring SPO2 with pulse oximeter and demonstrate accurate use of the pulse oximeter.;To learn and understand importance of maintaining oxygen saturations>88%;To learn and demonstrate proper pursed lip breathing techniques or other breathing techniques.      Long  Term Goals Verbalizes importance of monitoring SPO2 with pulse oximeter and return demonstration;Maintenance of O2 saturations>88%;Exhibits proper breathing techniques, such as pursed lip breathing or other method taught during program session;Compliance with respiratory medication Verbalizes importance of monitoring SPO2 with pulse oximeter and return demonstration;Maintenance of O2 saturations>88%;Exhibits proper breathing techniques, such as pursed lip breathing or other method taught during program session;Compliance with respiratory medication Verbalizes importance of monitoring SPO2 with pulse oximeter and return demonstration;Maintenance of O2 saturations>88%;Exhibits proper breathing techniques, such as pursed lip breathing or other method taught during program session;Compliance with respiratory medication     Goals/Expected Outcomes  compliant compliant compliant              Oxygen Discharge (Final Oxygen Re-Evaluation):  Oxygen Re-Evaluation - 05/28/22 1231       Program Oxygen Prescription   Program Oxygen Prescription None      Home Oxygen   Home Oxygen Device None    Sleep Oxygen Prescription CPAP    Liters per minute 0    Home Exercise Oxygen Prescription None    Home Resting Oxygen Prescription None    Compliance with Home Oxygen Use Yes      Goals/Expected Outcomes   Short Term Goals To learn and understand importance of monitoring SPO2 with pulse oximeter and demonstrate accurate use of the pulse oximeter.;To learn and understand importance of maintaining oxygen saturations>88%;To learn and demonstrate proper pursed lip breathing techniques or other breathing techniques.     Long  Term Goals Verbalizes importance of monitoring SPO2 with pulse oximeter and return demonstration;Maintenance of O2 saturations>88%;Exhibits proper breathing techniques, such as pursed lip breathing or other method taught during program session;Compliance with respiratory medication    Goals/Expected Outcomes compliant             Initial Exercise Prescription:  Initial Exercise Prescription - 02/20/22 1300       Date of Initial Exercise RX and Referring Provider   Date 02/20/22    Referring Provider Dr. Isaiah Serge    Expected Discharge Date 06/24/22      Treadmill   MPH 1    Grade 0    Minutes 17      NuStep   Level 1    SPM 60    Minutes 22      Prescription Details   Frequency (times per week) 2    Duration Progress to 30 minutes of continuous aerobic without signs/symptoms of physical distress      Intensity   THRR 40-80% of Max Heartrate 58-116    Ratings of Perceived Exertion 11-13    Perceived Dyspnea 0-4      Resistance Training   Training Prescription Yes    Weight 4    Reps 10-15             Perform Capillary Blood Glucose checks as needed.  Exercise Prescription Changes:    Exercise Prescription Changes     Row Name 03/04/22 1100 03/13/22 1200 03/25/22 1200 04/01/22 1300 04/15/22 1200     Response to Exercise   Blood Pressure (Admit) 130/70 148/68 -- 126/78 142/64   Blood Pressure (Exercise) 142/80 148/70 -- 148/78  150/72   Blood Pressure (Exit) 136/72 130/74 -- 140/70 126/76   Heart Rate (Admit) 60 bpm 69 bpm -- 64 bpm 64 bpm   Heart Rate (Exercise) 77 bpm 67 bpm -- 76 bpm 67 bpm   Heart Rate (Exit) 67 bpm 62 bpm -- 69 bpm 67 bpm   Oxygen Saturation (Admit) 97 % 91 % -- 95 % 94 %   Oxygen Saturation (Exercise) 96 % 95 % -- 94 % 95 %   Oxygen Saturation (Exit) 96 % 95 % -- 96 % 95 %   Rating of Perceived Exertion (Exercise) 12 12 -- 12 12   Perceived Dyspnea (Exercise) 12 12 -- 12 12   Duration Continue with 30 min of aerobic exercise without signs/symptoms of physical distress. Continue with 30 min of aerobic exercise without signs/symptoms of physical distress. -- Continue with 30 min of aerobic exercise without signs/symptoms of physical distress. Continue with 30 min of aerobic exercise without signs/symptoms of physical distress.   Intensity THRR unchanged THRR unchanged -- THRR unchanged THRR unchanged     Progression   Progression Continue to progress workloads to maintain intensity without signs/symptoms of physical distress. Continue to progress workloads to maintain intensity without signs/symptoms of physical distress. -- Continue to progress workloads to maintain intensity without signs/symptoms of physical distress. Continue to progress workloads to maintain intensity without signs/symptoms of physical distress.     Resistance Training   Training Prescription Yes Yes -- Yes Yes   Weight 5 5 -- 5 5   Reps 10-15 10-15 -- 10-15 10-15   Time 10 Minutes 10 Minutes -- 10 Minutes 10 Minutes     NuStep   Level 2 2 -- 2 3   SPM 109 110 -- 106 89   Minutes 39 39 -- 39 39   METs 2.1 2.1 -- 2 2     Home Exercise Plan   Plans to continue exercise at  -- -- Home (comment) -- --   Frequency -- -- Add 3 additional days to program exercise sessions. -- --   Initial Home Exercises Provided -- -- 03/25/22 -- --    Row Name 04/29/22 1300 05/13/22 1400 05/27/22 1227         Response to Exercise   Blood Pressure (Admit) 130/60 134/74 138/70     Blood Pressure (Exercise) 146/76 146/78 140/60     Blood Pressure (Exit) 130/68 142/80 126/62     Heart Rate (Admit) 61 bpm 62 bpm 63 bpm     Heart Rate (Exercise) 69 bpm 64 bpm 75 bpm     Heart Rate (Exit) 66 bpm 61 bpm 65 bpm     Oxygen Saturation (Admit) 96 % 95 % 97 %     Oxygen Saturation (Exercise) 94 % 94 % 96 %     Oxygen Saturation (Exit) 95 % 95 % 96 %     Rating of Perceived Exertion (Exercise) 12 12 12      Perceived Dyspnea (Exercise) 12 12 12      Duration Continue with 30 min of aerobic exercise without signs/symptoms of physical distress. Continue with 30 min of aerobic exercise without signs/symptoms of physical distress. Continue with 30 min of aerobic exercise without signs/symptoms of physical distress.     Intensity THRR unchanged THRR unchanged THRR unchanged       Progression   Progression Continue to progress workloads to maintain intensity without signs/symptoms of physical distress. Continue to progress workloads to maintain intensity without signs/symptoms of physical  distress. Continue to progress workloads to maintain intensity without signs/symptoms of physical distress.       Resistance Training   Training Prescription Yes Yes Yes     Weight 5 5 5      Reps 10-15 10-15 10-15     Time 10 Minutes 10 Minutes 10 Minutes       NuStep   Level 3 2 2      SPM 100 108 105     Minutes 39 39 39     METs 2.1 2.1 2.1              Exercise Comments:   Exercise Comments     Row Name 03/25/22 1253           Exercise Comments home exercise reviewed                Exercise Goals and Review:   Exercise Goals     Row Name 02/20/22 1319 03/04/22 1149 04/01/22  1305 04/29/22 1310 05/28/22 1228     Exercise Goals   Increase Physical Activity Yes Yes Yes Yes Yes   Intervention Provide advice, education, support and counseling about physical activity/exercise needs.;Develop an individualized exercise prescription for aerobic and resistive training based on initial evaluation findings, risk stratification, comorbidities and participant's personal goals. Provide advice, education, support and counseling about physical activity/exercise needs.;Develop an individualized exercise prescription for aerobic and resistive training based on initial evaluation findings, risk stratification, comorbidities and participant's personal goals. Provide advice, education, support and counseling about physical activity/exercise needs.;Develop an individualized exercise prescription for aerobic and resistive training based on initial evaluation findings, risk stratification, comorbidities and participant's personal goals. Provide advice, education, support and counseling about physical activity/exercise needs.;Develop an individualized exercise prescription for aerobic and resistive training based on initial evaluation findings, risk stratification, comorbidities and participant's personal goals. Provide advice, education, support and counseling about physical activity/exercise needs.;Develop an individualized exercise prescription for aerobic and resistive training based on initial evaluation findings, risk stratification, comorbidities and participant's personal goals.   Expected Outcomes Short Term: Attend rehab on a regular basis to increase amount of physical activity.;Long Term: Add in home exercise to make exercise part of routine and to increase amount of physical activity.;Long Term: Exercising regularly at least 3-5 days a week. Short Term: Attend rehab on a regular basis to increase amount of physical activity.;Long Term: Add in home exercise to make exercise part of routine and  to increase amount of physical activity.;Long Term: Exercising regularly at least 3-5 days a week. Short Term: Attend rehab on a regular basis to increase amount of physical activity.;Long Term: Add in home exercise to make exercise part of routine and to increase amount of physical activity.;Long Term: Exercising regularly at least 3-5 days a week. Short Term: Attend rehab on a regular basis to increase amount of physical activity.;Long Term: Add in home exercise to make exercise part of routine and to increase amount of physical activity.;Long Term: Exercising regularly at least 3-5 days a week. Short Term: Attend rehab on a regular basis to increase amount of physical activity.;Long Term: Add in home exercise to make exercise part of routine and to increase amount of physical activity.;Long Term: Exercising regularly at least 3-5 days a week.   Increase Strength and Stamina Yes Yes Yes Yes Yes   Intervention Provide advice, education, support and counseling about physical activity/exercise needs.;Develop an individualized exercise prescription for aerobic and resistive training based on initial evaluation findings, risk stratification, comorbidities and  participant's personal goals. Provide advice, education, support and counseling about physical activity/exercise needs.;Develop an individualized exercise prescription for aerobic and resistive training based on initial evaluation findings, risk stratification, comorbidities and participant's personal goals. Provide advice, education, support and counseling about physical activity/exercise needs.;Develop an individualized exercise prescription for aerobic and resistive training based on initial evaluation findings, risk stratification, comorbidities and participant's personal goals. Provide advice, education, support and counseling about physical activity/exercise needs.;Develop an individualized exercise prescription for aerobic and resistive training based on  initial evaluation findings, risk stratification, comorbidities and participant's personal goals. Provide advice, education, support and counseling about physical activity/exercise needs.;Develop an individualized exercise prescription for aerobic and resistive training based on initial evaluation findings, risk stratification, comorbidities and participant's personal goals.   Expected Outcomes Short Term: Increase workloads from initial exercise prescription for resistance, speed, and METs.;Short Term: Perform resistance training exercises routinely during rehab and add in resistance training at home;Long Term: Improve cardiorespiratory fitness, muscular endurance and strength as measured by increased METs and functional capacity ( ) Short Term: Increase workloads from initial exercise prescription for resistance, speed, and METs.;Short Term: Perform resistance training exercises routinely during rehab and add in resistance training at home;Long Term: Improve cardiorespiratory fitness, muscular endurance and strength as measured by increased METs and functional capacity ( ) Short Term: Increase workloads from initial exercise prescription for resistance, speed, and METs.;Short Term: Perform resistance training exercises routinely during rehab and add in resistance training at home;Long Term: Improve cardiorespiratory fitness, muscular endurance and strength as measured by increased METs and functional capacity ( ) Short Term: Increase workloads from initial exercise prescription for resistance, speed, and METs.;Short Term: Perform resistance training exercises routinely during rehab and add in resistance training at home;Long Term: Improve cardiorespiratory fitness, muscular endurance and strength as measured by increased METs and functional capacity ( ) Short Term: Increase workloads from initial exercise prescription for resistance, speed, and METs.;Short Term: Perform resistance training exercises  routinely during rehab and add in resistance training at home;Long Term: Improve cardiorespiratory fitness, muscular endurance and strength as measured by increased METs and functional capacity ( )   Able to understand and use rate of perceived exertion (RPE) scale Yes Yes Yes Yes Yes   Intervention Provide education and explanation on how to use RPE scale Provide education and explanation on how to use RPE scale Provide education and explanation on how to use RPE scale Provide education and explanation on how to use RPE scale Provide education and explanation on how to use RPE scale   Expected Outcomes Short Term: Able to use RPE daily in rehab to express subjective intensity level;Long Term:  Able to use RPE to guide intensity level when exercising independently Short Term: Able to use RPE daily in rehab to express subjective intensity level;Long Term:  Able to use RPE to guide intensity level when exercising independently Short Term: Able to use RPE daily in rehab to express subjective intensity level;Long Term:  Able to use RPE to guide intensity level when exercising independently Short Term: Able to use RPE daily in rehab to express subjective intensity level;Long Term:  Able to use RPE to guide intensity level when exercising independently Short Term: Able to use RPE daily in rehab to express subjective intensity level;Long Term:  Able to use RPE to guide intensity level when exercising independently   Able to understand and use Dyspnea scale Yes Yes Yes Yes Yes   Intervention Provide education and explanation on how to use Dyspnea scale Provide education and explanation on  how to use Dyspnea scale Provide education and explanation on how to use Dyspnea scale Provide education and explanation on how to use Dyspnea scale Provide education and explanation on how to use Dyspnea scale   Expected Outcomes Short Term: Able to use Dyspnea scale daily in rehab to express subjective sense of shortness of  breath during exertion;Long Term: Able to use Dyspnea scale to guide intensity level when exercising independently Short Term: Able to use Dyspnea scale daily in rehab to express subjective sense of shortness of breath during exertion;Long Term: Able to use Dyspnea scale to guide intensity level when exercising independently Short Term: Able to use Dyspnea scale daily in rehab to express subjective sense of shortness of breath during exertion;Long Term: Able to use Dyspnea scale to guide intensity level when exercising independently Short Term: Able to use Dyspnea scale daily in rehab to express subjective sense of shortness of breath during exertion;Long Term: Able to use Dyspnea scale to guide intensity level when exercising independently Short Term: Able to use Dyspnea scale daily in rehab to express subjective sense of shortness of breath during exertion;Long Term: Able to use Dyspnea scale to guide intensity level when exercising independently   Knowledge and understanding of Target Heart Rate Range (THRR) Yes Yes Yes Yes Yes   Intervention Provide education and explanation of THRR including how the numbers were predicted and where they are located for reference Provide education and explanation of THRR including how the numbers were predicted and where they are located for reference Provide education and explanation of THRR including how the numbers were predicted and where they are located for reference Provide education and explanation of THRR including how the numbers were predicted and where they are located for reference Provide education and explanation of THRR including how the numbers were predicted and where they are located for reference   Expected Outcomes Short Term: Able to state/look up THRR;Short Term: Able to use daily as guideline for intensity in rehab;Long Term: Able to use THRR to govern intensity when exercising independently Short Term: Able to state/look up THRR;Short Term: Able to  use daily as guideline for intensity in rehab;Long Term: Able to use THRR to govern intensity when exercising independently Short Term: Able to state/look up THRR;Short Term: Able to use daily as guideline for intensity in rehab;Long Term: Able to use THRR to govern intensity when exercising independently Short Term: Able to state/look up THRR;Short Term: Able to use daily as guideline for intensity in rehab;Long Term: Able to use THRR to govern intensity when exercising independently Short Term: Able to state/look up THRR;Short Term: Able to use daily as guideline for intensity in rehab;Long Term: Able to use THRR to govern intensity when exercising independently   Understanding of Exercise Prescription Yes Yes Yes Yes Yes   Intervention Provide education, explanation, and written materials on patient's individual exercise prescription Provide education, explanation, and written materials on patient's individual exercise prescription Provide education, explanation, and written materials on patient's individual exercise prescription Provide education, explanation, and written materials on patient's individual exercise prescription Provide education, explanation, and written materials on patient's individual exercise prescription   Expected Outcomes Short Term: Able to explain program exercise prescription;Long Term: Able to explain home exercise prescription to exercise independently Short Term: Able to explain program exercise prescription;Long Term: Able to explain home exercise prescription to exercise independently Short Term: Able to explain program exercise prescription;Long Term: Able to explain home exercise prescription to exercise independently Short Term: Able to  explain program exercise prescription;Long Term: Able to explain home exercise prescription to exercise independently Short Term: Able to explain program exercise prescription;Long Term: Able to explain home exercise prescription to exercise  independently            Exercise Goals Re-Evaluation :  Exercise Goals Re-Evaluation     Row Name 03/04/22 1150 04/01/22 1305 04/29/22 1310 05/28/22 1229       Exercise Goal Re-Evaluation   Exercise Goals Review Increase Physical Activity;Increase Strength and Stamina;Able to understand and use rate of perceived exertion (RPE) scale;Able to understand and use Dyspnea scale;Knowledge and understanding of Target Heart Rate Range (THRR);Understanding of Exercise Prescription Increase Physical Activity;Increase Strength and Stamina;Able to understand and use rate of perceived exertion (RPE) scale;Able to understand and use Dyspnea scale;Knowledge and understanding of Target Heart Rate Range (THRR);Understanding of Exercise Prescription Increase Physical Activity;Increase Strength and Stamina;Able to understand and use rate of perceived exertion (RPE) scale;Able to understand and use Dyspnea scale;Knowledge and understanding of Target Heart Rate Range (THRR);Understanding of Exercise Prescription Increase Physical Activity;Increase Strength and Stamina;Able to understand and use rate of perceived exertion (RPE) scale;Knowledge and understanding of Target Heart Rate Range (THRR);Able to understand and use Dyspnea scale;Able to check pulse independently    Comments Pt has completed 3 sessions of PR. He has L hip bursitis that bothers him when exercising. He was not able to use the treadmill due to the impact on his hip causing the bursitis pain to worsen. He is currently using the stepper both times and stated that his hip does not bother him when exercising. He has increased his  workload already and is currently exercising at 2.1 METs on the stepper. Will continue to monitor and progress as able. Pt has completed 9 sessions of PR. He is tolerating exercise well on the stepper. He is still not able to walk on the treadmill due to his L hip bursitis. He recived a shot earlier this month and stated that it  only helped his hip a little. He is currently exercising at 2.1 METs on the stepper. Will continue to monitor and progress as able. Pt has completed 15 sessions of PR. He is tolerating exercise well on the stepper and it is not aggervating his L hip bursitis. He is currently exercising at 2.1 METs on the stepper. Will continue to monitor and progress as able. Pt has completed 20 sessions of PR. He continues to have L hip pain due to bursitis, he had relief for a week after injection but the pain came back. He is currently exercising at 2.1 METs on the stepper. Will continue to monitor and progress as able.    Expected Outcomes Through exercise at rehab and home, the patient will meet their stated goals. Through exercise at rehab and home, the patient will meet their stated goals. Through exercise at rehab and home, the patient will meet their stated goals. Through exercise at rehab and home, the patient will meet their stated goals.             Discharge Exercise Prescription (Final Exercise Prescription Changes):  Exercise Prescription Changes - 05/27/22 1227       Response to Exercise   Blood Pressure (Admit) 138/70    Blood Pressure (Exercise) 140/60    Blood Pressure (Exit) 126/62    Heart Rate (Admit) 63 bpm    Heart Rate (Exercise) 75 bpm    Heart Rate (Exit) 65 bpm    Oxygen Saturation (Admit) 97 %  Oxygen Saturation (Exercise) 96 %    Oxygen Saturation (Exit) 96 %    Rating of Perceived Exertion (Exercise) 12    Perceived Dyspnea (Exercise) 12    Duration Continue with 30 min of aerobic exercise without signs/symptoms of physical distress.    Intensity THRR unchanged      Progression   Progression Continue to progress workloads to maintain intensity without signs/symptoms of physical distress.      Resistance Training   Training Prescription Yes    Weight 5    Reps 10-15    Time 10 Minutes      NuStep   Level 2    SPM 105    Minutes 39    METs 2.1              Nutrition:  Target Goals: Understanding of nutrition guidelines, daily intake of sodium 1500mg , cholesterol 200mg , calories 30% from fat and 7% or less from saturated fats, daily to have 5 or more servings of fruits and vegetables.  Biometrics:  Pre Biometrics - 02/20/22 1320       Pre Biometrics   Height 5\' 10"  (1.778 m)    Weight 126.6 kg    Waist Circumference 48.5 inches    Hip Circumference 50 inches    Waist to Hip Ratio 0.97 %    BMI (Calculated) 40.05    Triceps Skinfold 20 mm    % Body Fat 37.1 %    Grip Strength 44.3 kg    Flexibility 0 in    Single Leg Stand 6.68 seconds              Nutrition Therapy Plan and Nutrition Goals:  Nutrition Therapy & Goals - 02/26/22 1346       Personal Nutrition Goals   Comments Patient's diet assessment score was 50. We offer 2 educational sessions on heart heatlhy nutrition with handouts and assistance with RD referral if patient is interested.      Intervention Plan   Expected Outcomes Short Term Goal: Understand basic principles of dietary content, such as calories, fat, sodium, cholesterol and nutrients.             Nutrition Assessments:  Nutrition Assessments - 02/20/22 1256       MEDFICTS Scores   Pre Score 50            MEDIFICTS Score Key: ?70 Need to make dietary changes  40-70 Heart Healthy Diet ? 40 Therapeutic Level Cholesterol Diet   Picture Your Plate Scores: <16 Unhealthy dietary pattern with much room for improvement. 41-50 Dietary pattern unlikely to meet recommendations for good health and room for improvement. 51-60 More healthful dietary pattern, with some room for improvement.  >60 Healthy dietary pattern, although there may be some specific behaviors that could be improved.    Nutrition Goals Re-Evaluation:   Nutrition Goals Discharge (Final Nutrition Goals Re-Evaluation):   Psychosocial: Target Goals: Acknowledge presence or absence of significant depression and/or  stress, maximize coping skills, provide positive support system. Participant is able to verbalize types and ability to use techniques and skills needed for reducing stress and depression.  Initial Review & Psychosocial Screening:  Initial Psych Review & Screening - 02/20/22 1314       Initial Review   Current issues with Current Stress Concerns;Current Sleep Concerns;Current Anxiety/Panic    Source of Stress Concerns Chronic Illness    Comments His ILD is progressing, so this does stress him out and cause some anxiety  Family Dynamics   Good Support System? Yes    Comments His support system includes his wife and his two sons.      Barriers   Psychosocial barriers to participate in program There are no identifiable barriers or psychosocial needs.      Screening Interventions   Interventions Encouraged to exercise    Expected Outcomes Short Term goal: Identification and review with participant of any Quality of Life or Depression concerns found by scoring the questionnaire.;Long Term goal: The participant improves quality of Life and PHQ9 Scores as seen by post scores and/or verbalization of changes;Long Term Goal: Stressors or current issues are controlled or eliminated.             Quality of Life Scores:  Quality of Life - 02/20/22 1320       Quality of Life   Select Quality of Life      Quality of Life Scores   Health/Function Pre 20.16 %    Socioeconomic Pre 23.57 %    Psych/Spiritual Pre 22.43 %    Family Pre 28.8 %    GLOBAL Pre 22.53 %            Scores of 19 and below usually indicate a poorer quality of life in these areas.  A difference of  2-3 points is a clinically meaningful difference.  A difference of 2-3 points in the total score of the Quality of Life Index has been associated with significant improvement in overall quality of life, self-image, physical symptoms, and general health in studies assessing change in quality of life.   PHQ-9: Review  Flowsheet       02/20/2022  Depression screen PHQ 2/9  Decreased Interest 0  Down, Depressed, Hopeless 0  PHQ - 2 Score 0  Altered sleeping 0  Tired, decreased energy 1  Change in appetite 0  Feeling bad or failure about yourself  0  Trouble concentrating 0  Moving slowly or fidgety/restless 0  Suicidal thoughts 0  PHQ-9 Score 1  Difficult doing work/chores Not difficult at all   Interpretation of Total Score  Total Score Depression Severity:  1-4 = Minimal depression, 5-9 = Mild depression, 10-14 = Moderate depression, 15-19 = Moderately severe depression, 20-27 = Severe depression   Psychosocial Evaluation and Intervention:  Psychosocial Evaluation - 02/20/22 1339       Psychosocial Evaluation & Interventions   Interventions Encouraged to exercise with the program and follow exercise prescription    Comments Pt has no barriers to participate in PR. He does anxiety, sleep, and stress, but all of which are well controlled. He has no other identifiable psychosocial issues. He takes xanax for his sleep. He is prescribed 0.5 mg tid, but he reports that he only takes half of a tablet a few nights per week. His anxiety and stress stem from his ILD. He was told at his last visit with Dr. Isaiah Serge that his ILD is progressing, so this does cause him stress and anxiety. Dr. Sherwood Gambler, his PCP, prescribed him Lexapro for the anxiety, but he reports that he is not taking it. He has been taking ofev, an antifibrotic medication, for his ILD, and this is giving him hope for his future. He is also mentions how lucky he is to not be experiencing any side effects from ofev. He scored a 1 on his PHQ-9, and this is due to his lack of energy. He reports that he has a good support system from his wife and two  sons. His goals while in the program are to decrease his SOB with exertion and to lose weight. He is eager to start the program.    Expected Outcomes Pt's stress, anxiety, and sleep will continue to be  managed, and he will have no other identifiable psychosocial issues.    Continue Psychosocial Services  No Follow up required             Psychosocial Re-Evaluation:  Psychosocial Re-Evaluation     Row Name 02/26/22 1338 03/27/22 0748 04/23/22 0836 05/21/22 1553       Psychosocial Re-Evaluation   Current issues with Current Anxiety/Panic;Current Stress Concerns;Current Sleep Concerns Current Anxiety/Panic;Current Stress Concerns;Current Sleep Concerns Current Anxiety/Panic;Current Stress Concerns;Current Sleep Concerns Current Anxiety/Panic;Current Stress Concerns;Current Sleep Concerns    Comments Patient is new to the program. He has completed 1 session. His sleep and anxiety continues to be managed with xanax. He continues to have no psychosocial barriers identified. We will continue monitor his progress. Patient has completed  7 session. His sleep and anxiety continues to be managed with Alprazolam. He seems to enjoy the sessions and demonstrates an interest in improving his health. He continues to have no psychosocial barriers identified. We will continue monitor his progress. Patient has completed 13 sessions.  His sleep and anxiety continue to be managed with Alprazolam.  He seems to enjoy the sessions and continues to demonstrate an interest in improving his health.  He continues to have no psychosocial barriers identified.  We will continue to monitor his progress. Pt has completed 19 sessions.  His sleep problems and anxiety continue to be managed with Alprazolam.  He seems to enjoy the sessions and continues to demonstrate an interest in improving his overall health.  He continues to have no identifiable psychosocial barriers.  We will continue to monitor his progress.    Expected Outcomes Patient will continue to have no psychosocial barriers identified. Patient will continue to have no psychosocial barriers identified. Patient will continue to have no psychosocial barriers identified.  Patient will continue to have no identifiable psychosocial barriers, and his sleep problems and anxiety will continue to be managed with Alprazolam.    Interventions Relaxation education;Stress management education;Encouraged to attend Pulmonary Rehabilitation for the exercise Relaxation education;Stress management education;Encouraged to attend Pulmonary Rehabilitation for the exercise Relaxation education;Stress management education;Encouraged to attend Pulmonary Rehabilitation for the exercise Stress management education;Relaxation education;Encouraged to attend Pulmonary Rehabilitation for the exercise    Continue Psychosocial Services  No Follow up required No Follow up required No Follow up required No Follow up required      Initial Review   Source of Stress Concerns Chronic Illness Chronic Illness Chronic Illness Chronic Illness    Comments His ILD is progressing, so this does stress him out and cause some anxiety His ILD is progressing, so this does stress him out and cause some anxiety His ILD is progressing, so this does stress him out and cause some anxiety --             Psychosocial Discharge (Final Psychosocial Re-Evaluation):  Psychosocial Re-Evaluation - 05/21/22 1553       Psychosocial Re-Evaluation   Current issues with Current Anxiety/Panic;Current Stress Concerns;Current Sleep Concerns    Comments Pt has completed 19 sessions.  His sleep problems and anxiety continue to be managed with Alprazolam.  He seems to enjoy the sessions and continues to demonstrate an interest in improving his overall health.  He continues to have no identifiable psychosocial barriers.  We  will continue to monitor his progress.    Expected Outcomes Patient will continue to have no identifiable psychosocial barriers, and his sleep problems and anxiety will continue to be managed with Alprazolam.    Interventions Stress management education;Relaxation education;Encouraged to attend Pulmonary  Rehabilitation for the exercise    Continue Psychosocial Services  No Follow up required      Initial Review   Source of Stress Concerns Chronic Illness              Education: Education Goals: Education classes will be provided on a weekly basis, covering required topics. Participant will state understanding/return demonstration of topics presented.  Learning Barriers/Preferences:  Learning Barriers/Preferences - 02/20/22 1319       Learning Barriers/Preferences   Learning Barriers None    Learning Preferences Written Material             Education Topics: How Lungs Work and Diseases: - Discuss the anatomy of the lungs and diseases that can affect the lungs, such as COPD. Flowsheet Row PULMONARY REHAB OTHER RESPIRATORY from 05/15/2022 in [place] [facility]  Date 05/08/22  Educator handout       Exercise: -Discuss the importance of exercise, FITT principles of exercise, normal and abnormal responses to exercise, and how to exercise safely.   Environmental Irritants: -Discuss types of environmental irritants and how to limit exposure to environmental irritants. Flowsheet Row PULMONARY REHAB OTHER RESPIRATORY from 05/15/2022 in [place] [facility]  Date 05/15/22  Educator handout       Meds/Inhalers and oxygen: - Discuss respiratory medications, definition of an inhaler and oxygen, and the proper way to use an inhaler and oxygen.   Energy Saving Techniques: - Discuss methods to conserve energy and decrease shortness of breath when performing activities of daily living.  Flowsheet Row PULMONARY REHAB OTHER RESPIRATORY from 05/15/2022 in [place] [facility]  Date 02/27/22  Educator HB  Instruction Review Code 1- Verbalizes Understanding       Bronchial Hygiene / Breathing Techniques: - Discuss breathing mechanics, pursed-lip breathing technique,  proper posture, effective ways to clear airways, and other  functional breathing techniques   Cleaning Equipment: - Provides group verbal and written instruction about the health risks of elevated stress, cause of high stress, and healthy ways to reduce stress. Flowsheet Row PULMONARY REHAB OTHER RESPIRATORY from 05/15/2022 in [place] [facility]  Date 03/13/22  Educator HB  Instruction Review Code 1- Verbalizes Understanding       Nutrition I: Fats: - Discuss the types of cholesterol, what cholesterol does to the body, and how cholesterol levels can be controlled. Flowsheet Row PULMONARY REHAB OTHER RESPIRATORY from 05/15/2022 in [place] [facility]  Date 03/20/22  Educator DF  Instruction Review Code 1- Verbalizes Understanding       Nutrition II: Labels: -Discuss the different components of food labels and how to read food labels. Flowsheet Row PULMONARY REHAB OTHER RESPIRATORY from 05/15/2022 in [place] [facility]  Date 03/27/22  Educator DF  Instruction Review Code 2- Demonstrated Understanding       Respiratory Infections: - Discuss the signs and symptoms of respiratory infections, ways to prevent respiratory infections, and the importance of seeking medical treatment when having a respiratory infection. Flowsheet Row PULMONARY REHAB OTHER RESPIRATORY from 05/15/2022 in [place] [facility]  Date 04/10/22  Educator HB  Instruction Review Code 1- Verbalizes Understanding       Stress I: Signs and Symptoms: - Discuss the causes of  stress, how stress may lead to anxiety and depression, and ways to limit stress.   Stress II: Relaxation: -Discuss relaxation techniques to limit stress. Flowsheet Row PULMONARY REHAB OTHER RESPIRATORY from 05/15/2022 in [place] [facility]  Date 04/24/22  Educator handout       Oxygen for Home/Travel: - Discuss how to prepare for travel when on oxygen and proper ways to transport and store oxygen to ensure  safety.   Knowledge Questionnaire Score:  Knowledge Questionnaire Score - 02/20/22 1256       Knowledge Questionnaire Score   Pre Score 17/18             Core Components/Risk Factors/Patient Goals at Admission:  Personal Goals and Risk Factors at Admission - 02/20/22 1322       Core Components/Risk Factors/Patient Goals on Admission    Weight Management Yes;Obesity;Weight Loss    Intervention Obesity: Provide education and appropriate resources to help participant work on and attain dietary goals.;Weight Management/Obesity: Establish reasonable short term and long term weight goals.;Weight Management: Provide education and appropriate resources to help participant work on and attain dietary goals.;Weight Management: Develop a combined nutrition and exercise program designed to reach desired caloric intake, while maintaining appropriate intake of nutrient and fiber, sodium and fats, and appropriate energy expenditure required for the weight goal.    Expected Outcomes Short Term: Continue to assess and modify interventions until short term weight is achieved;Long Term: Adherence to nutrition and physical activity/exercise program aimed toward attainment of established weight goal;Weight Maintenance: Understanding of the daily nutrition guidelines, which includes 25-35% calories from fat, 7% or less cal from saturated fats, less than 200mg  cholesterol, less than 1.5gm of sodium, & 5 or more servings of fruits and vegetables daily;Weight Loss: Understanding of general recommendations for a balanced deficit meal plan, which promotes 1-2 lb weight loss per week and includes a negative energy balance of [phone] kcal/d;Understanding recommendations for meals to include 15-35% energy as protein, 25-35% energy from fat, 35-60% energy from carbohydrates, less than 200mg  of dietary cholesterol, 20-35 gm of total fiber daily;Understanding of distribution of calorie intake throughout the day with the  consumption of 4-5 meals/snacks    Improve shortness of breath with ADL's Yes    Intervention Provide education, individualized exercise plan and daily activity instruction to help decrease symptoms of SOB with activities of daily living.    Expected Outcomes Short Term: Improve cardiorespiratory fitness to achieve a reduction of symptoms when performing ADLs;Long Term: Be able to perform more ADLs without symptoms or delay the onset of symptoms             Core Components/Risk Factors/Patient Goals Review:   Goals and Risk Factor Review     Row Name 02/26/22 1343 03/27/22 0750 04/23/22 0838 05/21/22 1555       Core Components/Risk Factors/Patient Goals Review   Personal Goals Review Weight Management/Obesity;Other;Improve shortness of breath with ADL's Weight Management/Obesity;Other;Improve shortness of breath with ADL's Weight Management/Obesity;Other;Improve shortness of breath with ADL's Weight Management/Obesity;Other;Improve shortness of breath with ADL's    Review Patient was referred to PR with ILD. He has completed 1 session. His weight is 126.6 KG. He exercises on RA with O2 sats at 92-95%. His persoanl goals are to decrease his SOB with exertion and lose weight. We will continue to monitor his progress as he works towards meeting these goal. Patient has completed 7 session. His weight is 123.4 KG down 3.2 KG from last 30 day review. He continues to  exercise on RA with O2 sats at 95-96% which has improved. His persoanl goals for the program are to decrease his SOB with exertion and lose weight. We will continue to monitor his progress as he works towards meeting these goal. Patient has completed 13 sessions.  His weight is 123.8 kg, down from his initial weight of 126.6 kg.  He continues to exercise on RA with 02 sats from 92-96%.  His personal goals for the program are to decrease his SOB with exertion and lose weight.  We will continue to monitor his progress as he works toward  meeting these goals. Pt has completed 19 sessions.  His current weight is 125 kg.  He continues to exercise on RA with 02 sats ranging from 92-96%.  His blood pressure has been elevated at rest intermittently, with his systolic BP ranging from the 130-140s.  During exercise his BP has also been elevated intermittently with his highest blood pressure at 146/78.  He continues to have chronic pain in his left hip, but he is able to exercise on the Nust[place].  His goals for the program are to decrease his shortness of breath with activity and lose weight.  We will continue to monitor his progress as he works toward meeting these goals.    Expected Outcomes Patient will complete the program meeting both perosnal and program goals. Patient will complete the program meeting both perosnal and program goals. Patient will complete the program meeting both perosnal and program goals. Patient will complete the program meeting both personal and program goals.             Core Components/Risk Factors/Patient Goals at Discharge (Final Review):   Goals and Risk Factor Review - 05/21/22 1555       Core Components/Risk Factors/Patient Goals Review   Personal Goals Review Weight Management/Obesity;Other;Improve shortness of breath with ADL's    Review Pt has completed 19 sessions.  His current weight is 125 kg.  He continues to exercise on RA with 02 sats ranging from 92-96%.  His blood pressure has been elevated at rest intermittently, with his systolic BP ranging from the 130-140s.  During exercise his BP has also been elevated intermittently with his highest blood pressure at 146/78.  He continues to have chronic pain in his left hip, but he is able to exercise on the Nustep.  His goals for the program are to decrease his shortness of breath with activity and lose weight.  We will continue to monitor his progress as he works toward meeting these goals.    Expected Outcomes Patient will complete the program meeting both  personal and program goals.             ITP Comments:   Comments: ITP REVIEW Pt is making expected progress toward pulmonary rehab goals after completing 21 sessions. Recommend continued exercise, life style modification, education, and utilization of breathing techniques to increase stamina and strength and decrease shortness of breath with exertion.

## 2022-06-03 ENCOUNTER — Encounter (HOSPITAL_COMMUNITY)
Admission: RE | Admit: 2022-06-03 | Discharge: 2022-06-03 | Disposition: A | Discharge status: Home / Self Care | Payer: Medicare HMO | Source: Ambulatory Visit | Attending: Pulmonary Disease | Admitting: Pulmonary Disease

## 2022-06-03 DIAGNOSIS — I129 Hypertensive chronic kidney disease with stage 1 through stage 4 chronic kidney disease, or unspecified chronic kidney disease: Secondary | ICD-10-CM | POA: Diagnosis not present

## 2022-06-03 DIAGNOSIS — L409 Psoriasis, unspecified: Secondary | ICD-10-CM | POA: Diagnosis not present

## 2022-06-03 DIAGNOSIS — Z5181 Encounter for therapeutic drug level monitoring: Secondary | ICD-10-CM

## 2022-06-03 DIAGNOSIS — E785 Hyperlipidemia, unspecified: Secondary | ICD-10-CM | POA: Diagnosis not present

## 2022-06-03 DIAGNOSIS — F419 Anxiety disorder, unspecified: Secondary | ICD-10-CM | POA: Diagnosis not present

## 2022-06-03 DIAGNOSIS — Z008 Encounter for other general examination: Secondary | ICD-10-CM | POA: Diagnosis not present

## 2022-06-03 DIAGNOSIS — N529 Male erectile dysfunction, unspecified: Secondary | ICD-10-CM | POA: Diagnosis not present

## 2022-06-03 DIAGNOSIS — M1990 Unspecified osteoarthritis, unspecified site: Secondary | ICD-10-CM | POA: Diagnosis not present

## 2022-06-03 DIAGNOSIS — K219 Gastro-esophageal reflux disease without esophagitis: Secondary | ICD-10-CM | POA: Diagnosis not present

## 2022-06-03 DIAGNOSIS — I2510 Atherosclerotic heart disease of native coronary artery without angina pectoris: Secondary | ICD-10-CM | POA: Diagnosis not present

## 2022-06-03 DIAGNOSIS — G4700 Insomnia, unspecified: Secondary | ICD-10-CM | POA: Diagnosis not present

## 2022-06-03 DIAGNOSIS — J849 Interstitial pulmonary disease, unspecified: Secondary | ICD-10-CM | POA: Diagnosis not present

## 2022-06-03 DIAGNOSIS — I499 Cardiac arrhythmia, unspecified: Secondary | ICD-10-CM | POA: Diagnosis not present

## 2022-06-03 DIAGNOSIS — G4730 Sleep apnea, unspecified: Secondary | ICD-10-CM | POA: Diagnosis not present

## 2022-06-03 NOTE — Progress Notes (Signed)
Daily Session Note  Patient Details  Name: SHREYAS PIATKOWSKI MRN: [id number] Date of Birth: 23-Sep-1945 Referring Provider:   Flowsheet Row PULMONARY REHAB OTHER RESP ORIENTATION from 02/20/2022 in [facility] CARDIAC REHABILITATION  Referring Provider Dr. Isaiah Serge       Encounter Date: 06/03/2022  Check In:  Session Check In - 06/03/22 1045       Check-In   Supervising physician immediately available to respond to emergencies [facility] MD immediately available    Physician(s) Dr Wyline Mood    Location [facility]    Staff Present Erskine Speed, RN;Fynn Adel Daphine Deutscher, RN, BSN;Other   Kary Kos, EP   Virtual Visit No    Medication changes reported     No    Fall or balance concerns reported    No    Tobacco Cessation No Change    Warm-up and Cool-down Performed as group-led instruction    Resistance Training Performed Yes      Pain Assessment   Currently in Pain? No/denies    Pain Score 0-No pain    Multiple Pain Sites No             Capillary Blood Glucose: No results found for this or any previous visit (from the past 24 hour(s)).    Social History   Tobacco Use  Smoking Status Former   Packs/day: 2.00   Years: 11.00   Additional pack years: 0.00   Total pack years: 22.00   Types: Cigarettes   Start date: 05/22/1962   Quit date: 05/21/1973   Years since quitting: 49.0  Smokeless Tobacco Never  Tobacco Comments   Quit 25 yrs now     Goals Met:  Proper associated with RPD/PD & O2 Sat Independence with exercise equipment Using PLB without cueing & demonstrates good technique Exercise tolerated well Queuing for purse lip breathing No report of concerns or symptoms today Strength training completed today  Goals Unmet:  Not Applicable  Comments: Checkout at 1145.   Dr. Erick Blinks is Medical Director for [facility] Pulmonary Rehab.

## 2022-06-04 ENCOUNTER — Ambulatory Visit: Payer: Medicare HMO | Admitting: Pulmonary Disease

## 2022-06-05 ENCOUNTER — Encounter (HOSPITAL_COMMUNITY): Payer: Medicare HMO

## 2022-06-10 ENCOUNTER — Encounter (HOSPITAL_COMMUNITY)
Admission: RE | Admit: 2022-06-10 | Discharge: 2022-06-10 | Disposition: A | Discharge status: Home / Self Care | Payer: Medicare HMO | Source: Ambulatory Visit | Attending: Pulmonary Disease | Admitting: Pulmonary Disease

## 2022-06-10 VITALS — Wt 273.8 lb

## 2022-06-10 DIAGNOSIS — J849 Interstitial pulmonary disease, unspecified: Secondary | ICD-10-CM

## 2022-06-10 DIAGNOSIS — Z5181 Encounter for therapeutic drug level monitoring: Secondary | ICD-10-CM | POA: Insufficient documentation

## 2022-06-10 NOTE — Progress Notes (Signed)
Daily Session Note  Patient Details  Name: Alexander Nunez MRN: [id number] Date of Birth: [birth year] Referring Provider:   Flowsheet Row PULMONARY REHAB OTHER RESP ORIENTATION from 02/20/2022 in [facility] CARDIAC REHABILITATION  Referring Provider Dr. Isaiah Serge       Encounter Date: 06/10/2022  Check In:  Session Check In - 06/10/22 1045       Check-In   Supervising physician immediately available to respond to emergencies [facility] MD immediately available    Physician(s) Dr Wyline Mood    Location [facility]    Staff Present Ross Ludwig, BS, Exercise Physiologist;Phyllis Billingsley, RN;Janis Sol Daphine Deutscher, RN, BSN    Virtual Visit No    Medication changes reported     No    Fall or balance concerns reported    No    Tobacco Cessation No Change    Warm-up and Cool-down Performed as group-led instruction    Resistance Training Performed Yes      Pain Assessment   Currently in Pain? Yes    Pain Score 7     Pain Location Hip    Pain Orientation Left    Pain Descriptors / Indicators Aching    Pain Type Chronic pain    Multiple Pain Sites No             Capillary Blood Glucose: No results found for this or any previous visit (from the past 24 hour(s)).    Social History   Tobacco Use  Smoking Status Former   Packs/day: 2.00   Years: 11.00   Additional pack years: 0.00   Total pack years: 22.00   Types: Cigarettes   Start date: 05/22/1962   Quit date: 05/21/1973   Years since quitting: 49.0  Smokeless Tobacco Never  Tobacco Comments   Quit 25 yrs now     Goals Met:  Proper associated with RPD/PD & O2 Sat Independence with exercise equipment Using PLB without cueing & demonstrates good technique Exercise tolerated well Queuing for purse lip breathing No report of concerns or symptoms today Strength training completed today  Goals Unmet:  Not Applicable  Comments: Checkout at 1145.   Dr. Erick Blinks is Medical Director for [facility]  Pulmonary Rehab.

## 2022-06-12 ENCOUNTER — Encounter (HOSPITAL_COMMUNITY)
Admission: RE | Admit: 2022-06-12 | Discharge: 2022-06-12 | Disposition: A | Discharge status: Home / Self Care | Payer: Medicare HMO | Source: Ambulatory Visit | Attending: Pulmonary Disease | Admitting: Pulmonary Disease

## 2022-06-12 DIAGNOSIS — J849 Interstitial pulmonary disease, unspecified: Secondary | ICD-10-CM

## 2022-06-12 DIAGNOSIS — Z5181 Encounter for therapeutic drug level monitoring: Secondary | ICD-10-CM | POA: Diagnosis not present

## 2022-06-12 NOTE — Progress Notes (Addendum)
Daily Session Note  Patient Details  Name: Alexander Nunez MRN: [id number] Date of Birth: February 24, 1945 Referring Provider:   Flowsheet Row PULMONARY REHAB OTHER RESP ORIENTATION from 02/20/2022 in [facility] CARDIAC REHABILITATION  Referring Provider Dr. Isaiah Serge       Encounter Date: 06/12/2022  Check In:  Session Check In - 06/12/22 1048       Check-In   Supervising physician immediately available to respond to emergencies [facility] MD immediately available    Physician(s) Dr. Diona Browner    Location [facility]    Staff Present Ross Ludwig, BS, Exercise Physiologist;Abcde Oneil BSN, RN;Debra Laural Benes, RN, BSN    Virtual Visit No    Medication changes reported     No    Fall or balance concerns reported    No    Tobacco Cessation No Change    Warm-up and Cool-down Performed as group-led instruction    Resistance Training Performed Yes    VAD Patient? No    PAD/SET Patient? No      Pain Assessment   Currently in Pain? Yes    Pain Score 5     Pain Location Hip    Pain Orientation Right    Pain Descriptors / Indicators Aching    Pain Type Chronic pain    Pain Onset More than a month ago    Pain Frequency Constant    Multiple Pain Sites No             Capillary Blood Glucose: No results found for this or any previous visit (from the past 24 hour(s)).    Social History   Tobacco Use  Smoking Status Former   Packs/day: 2.00   Years: 11.00   Additional pack years: 0.00   Total pack years: 22.00   Types: Cigarettes   Start date: 05/22/1962   Quit date: 05/21/1973   Years since quitting: 49.0  Smokeless Tobacco Never  Tobacco Comments   Quit 25 yrs now     Goals Met:  Proper associated with RPD/PD & O2 Sat Independence with exercise equipment Using PLB without cueing & demonstrates good technique Exercise tolerated well Queuing for purse lip breathing No report of concerns or symptoms today Strength training completed today  Goals Unmet:   Not Applicable  Comments: check out at 11:45  Dr. Erick Blinks is Medical Director for [facility] Pulmonary Rehab.

## 2022-06-12 NOTE — Progress Notes (Signed)
Duplicate entry

## 2022-06-17 ENCOUNTER — Encounter (HOSPITAL_COMMUNITY): Payer: Medicare HMO

## 2022-06-19 ENCOUNTER — Encounter (HOSPITAL_COMMUNITY)
Admission: RE | Admit: 2022-06-19 | Discharge: 2022-06-19 | Disposition: A | Discharge status: Home / Self Care | Payer: Medicare HMO | Source: Ambulatory Visit | Attending: Pulmonary Disease | Admitting: Pulmonary Disease

## 2022-06-19 DIAGNOSIS — J849 Interstitial pulmonary disease, unspecified: Secondary | ICD-10-CM | POA: Diagnosis not present

## 2022-06-19 DIAGNOSIS — Z5181 Encounter for therapeutic drug level monitoring: Secondary | ICD-10-CM | POA: Diagnosis not present

## 2022-06-19 NOTE — Progress Notes (Signed)
Daily Session Note  Patient Details  Name: Alexander Nunez MRN: [id number] Date of Birth: 06/15/1945 Referring Provider:   Flowsheet Row PULMONARY REHAB OTHER RESP ORIENTATION from 02/20/2022 in [facility] CARDIAC REHABILITATION  Referring Provider Dr. Isaiah Serge       Encounter Date: 06/19/2022  Check In:  Session Check In - 06/19/22 1041       Check-In   Physician(s) Dr. Jenene Slicker    Location [facility]    Staff Present Ross Ludwig, BS, Exercise Physiologist;Naelani Lafrance BSN, RN    Virtual Visit No    Medication changes reported     Yes    Comments Pt started Mycophenolate 500 mg BID on 06/16/22    Fall or balance concerns reported    No    Tobacco Cessation No Change    Warm-up and Cool-down Performed as group-led instruction    Resistance Training Performed Yes    VAD Patient? No    PAD/SET Patient? No      Pain Assessment   Currently in Pain? Yes    Pain Score 4     Pain Location Hip    Pain Orientation Right    Pain Descriptors / Indicators Aching    Pain Type Chronic pain    Pain Onset More than a month ago    Pain Frequency Constant    Multiple Pain Sites No             Capillary Blood Glucose: No results found for this or any previous visit (from the past 24 hour(s)).    Social History   Tobacco Use  Smoking Status Former   Packs/day: 2.00   Years: 11.00   Additional pack years: 0.00   Total pack years: 22.00   Types: Cigarettes   Start date: 05/22/1962   Quit date: 05/21/1973   Years since quitting: 49.1  Smokeless Tobacco Never  Tobacco Comments   Quit 25 yrs now     Goals Met:  Proper associated with RPD/PD & O2 Sat Independence with exercise equipment Using PLB without cueing & demonstrates good technique Exercise tolerated well Queuing for purse lip breathing No report of concerns or symptoms today Strength training completed today  Goals Unmet:  Not Applicable  Comments: check out at 11:45   Dr.  Erick Blinks is Medical Director for [facility] Pulmonary Rehab.

## 2022-06-22 DIAGNOSIS — G4733 Obstructive sleep apnea (adult) (pediatric): Secondary | ICD-10-CM | POA: Diagnosis not present

## 2022-06-22 DIAGNOSIS — I10 Essential (primary) hypertension: Secondary | ICD-10-CM | POA: Diagnosis not present

## 2022-06-22 DIAGNOSIS — F419 Anxiety disorder, unspecified: Secondary | ICD-10-CM | POA: Diagnosis not present

## 2022-06-24 ENCOUNTER — Encounter (HOSPITAL_COMMUNITY)
Admission: RE | Admit: 2022-06-24 | Discharge: 2022-06-24 | Disposition: A | Discharge status: Home / Self Care | Payer: Medicare HMO | Source: Ambulatory Visit | Attending: Pulmonary Disease | Admitting: Pulmonary Disease

## 2022-06-24 VITALS — Ht 70.0 in | Wt 270.5 lb

## 2022-06-24 DIAGNOSIS — J849 Interstitial pulmonary disease, unspecified: Secondary | ICD-10-CM | POA: Diagnosis not present

## 2022-06-24 DIAGNOSIS — Z5181 Encounter for therapeutic drug level monitoring: Secondary | ICD-10-CM

## 2022-06-24 NOTE — Progress Notes (Signed)
Daily Session Note  Patient Details  Name: Alexander Nunez MRN: [id number] Date of Birth: 11/05/1945 Referring Provider:   Flowsheet Row PULMONARY REHAB OTHER RESP ORIENTATION from 02/20/2022 in [facility] CARDIAC REHABILITATION  Referring Provider Dr. Isaiah Serge       Encounter Date: 06/24/2022  Check In:  Session Check In - 06/24/22 1045       Check-In   Supervising physician immediately available to respond to emergencies [facility] MD immediately available    Physician(s) Dr Tenny Craw    Location [facility]    Staff Present Ross Ludwig, BS, Exercise Physiologist;Deeann Servidio Daphine Deutscher, RN, BSN    Virtual Visit No    Medication changes reported     No    Fall or balance concerns reported    No    Tobacco Cessation No Change    Warm-up and Cool-down Performed as group-led instruction    Resistance Training Performed Yes      Pain Assessment   Currently in Pain? No/denies    Pain Score 0-No pain             Capillary Blood Glucose: No results found for this or any previous visit (from the past 24 hour(s)).    Social History   Tobacco Use  Smoking Status Former   Packs/day: 2.00   Years: 11.00   Additional pack years: 0.00   Total pack years: 22.00   Types: Cigarettes   Start date: 05/22/1962   Quit date: 05/21/1973   Years since quitting: 49.1  Smokeless Tobacco Never  Tobacco Comments   Quit 25 yrs now     Goals Met:  Proper associated with RPD/PD & O2 Sat Independence with exercise equipment Using PLB without cueing & demonstrates good technique Exercise tolerated well Queuing for purse lip breathing No report of concerns or symptoms today Strength training completed today  Goals Unmet:  Not Applicable  Comments: Checkout at 1145.   Dr. Erick Blinks is Medical Director for [facility] Pulmonary Rehab.

## 2022-06-26 ENCOUNTER — Encounter (HOSPITAL_COMMUNITY): Payer: Medicare HMO

## 2022-06-27 NOTE — Progress Notes (Signed)
Discharge Progress Report  Patient Details  Name: Alexander Nunez MRN: [id number] Date of Birth: [birth year] Referring Provider:   Flowsheet Row PULMONARY REHAB OTHER RESP ORIENTATION from 02/20/2022 in [facility] CARDIAC REHABILITATION  Referring Provider Dr. Isaiah Serge        Number of Visits: 27  Reason for Discharge:  Patient reached a stable level of exercise. Patient independent in their exercise. Patient has met program and personal goals.  Smoking History:  Social History   Tobacco Use  Smoking Status Former   Packs/day: 2.00   Years: 11.00   Additional pack years: 0.00   Total pack years: 22.00   Types: Cigarettes   Start date: 05/22/1962   Quit date: 05/21/1973   Years since quitting: 49.1  Smokeless Tobacco Never  Tobacco Comments   Quit 25 yrs now     Diagnosis:  Interstitial lung disease (HCC)  Therapeutic drug monitoring  ADL UCSD:  Pulmonary Assessment Scores     Row Name 02/20/22 1253 06/26/22 1533       ADL UCSD   ADL Phase Entry Exit    SOB Score total 45 47    Rest 0 0    Walk 2 2    Stairs 3 3    Bath 1 2    Dress 2 2    Shop 2 16      CAT Score   CAT Score 16 16      mMRC Score   mMRC Score 1 1             Initial Exercise Prescription:  Initial Exercise Prescription - 02/20/22 1300       Date of Initial Exercise RX and Referring Provider   Date 02/20/22    Referring Provider Dr. Isaiah Serge    Expected Discharge Date 06/24/22      Treadmill   MPH 1    Grade 0    Minutes 17      NuStep   Level 1    SPM 60    Minutes 22      Prescription Details   Frequency (times per week) 2    Duration Progress to 30 minutes of continuous aerobic without signs/symptoms of physical distress      Intensity   THRR 40-80% of Max Heartrate 58-116    Ratings of Perceived Exertion 11-13    Perceived Dyspnea 0-4      Resistance Training   Training Prescription Yes    Weight 4    Reps 10-15             Discharge Exercise  Prescription (Final Exercise Prescription Changes):  Exercise Prescription Changes - 06/10/22 1100       Response to Exercise   Blood Pressure (Admit) 116/74    Blood Pressure (Exercise) 146/68    Blood Pressure (Exit) 132/72    Heart Rate (Admit) 61 bpm    Heart Rate (Exercise) 70 bpm    Heart Rate (Exit) 57 bpm    Oxygen Saturation (Admit) 95 %    Oxygen Saturation (Exercise) 95 %    Oxygen Saturation (Exit) 96 %    Rating of Perceived Exertion (Exercise) 12    Perceived Dyspnea (Exercise) 12    Duration Continue with 30 min of aerobic exercise without signs/symptoms of physical distress.    Intensity THRR unchanged      Progression   Progression Continue to progress workloads to maintain intensity without signs/symptoms of physical distress.      Resistance  Training   Training Prescription Yes    Weight 5    Reps 10-15    Time 10 Minutes      NuStep   Level 2    SPM 97    Minutes 39    METs 2.9             Functional Capacity:  6 Minute Walk     Row Name 02/20/22 1317 06/24/22 1437       6 Minute Walk   Phase Initial Discharge    Distance 1350 feet 1400 feet    Walk Time 6 minutes 6 minutes    # of Rest Breaks 0 0    MPH 2.55 2.65    METS 2.41 2.28    RPE 13 12    Perceived Dyspnea  12 12    VO2 Peak 8.46 7.98    Symptoms No No    Resting HR 73 bpm 61 bpm    Resting BP 128/68 130/62    Resting Oxygen Saturation  96 % 93 %    Exercise Oxygen Saturation  during 6 min walk 91 % 90 %    Max Ex. HR 116 bpm 94 bpm    Max Ex. BP 160/70 160/62    2 Minute Post BP 130/68 136/62      Interval HR   1 Minute HR 73 66    2 Minute HR 73 66    3 Minute HR 73 79    4 Minute HR 80 94    5 Minute HR 116 91    6 Minute HR 116 81    2 Minute Post HR 76 59    Interval Heart Rate? Yes Yes      Interval Oxygen   Interval Oxygen? Yes Yes    Baseline Oxygen Saturation % 96 % 93 %    1 Minute Oxygen Saturation % 95 % 93 %    1 Minute Liters of Oxygen 0 L 0 L     2 Minute Oxygen Saturation % 92 % 90 %    2 Minute Liters of Oxygen 0 L 0 L    3 Minute Oxygen Saturation % 91 % 91 %    3 Minute Liters of Oxygen 0 L 0 L    4 Minute Oxygen Saturation % 93 % 91 %    4 Minute Liters of Oxygen 0 L 0 L    5 Minute Oxygen Saturation % 93 % 90 %    5 Minute Liters of Oxygen 0 L 0 L    6 Minute Oxygen Saturation % 91 % 90 %    6 Minute Liters of Oxygen 0 L 0 L    2 Minute Post Oxygen Saturation % 96 % 96 %    2 Minute Post Liters of Oxygen 0 L 0 L             Psychological, QOL, Others - Outcomes: PHQ 2/9:    06/26/2022    3:34 PM 02/20/2022    1:12 PM  Depression screen PHQ 2/9  Decreased Interest 0 0  Down, Depressed, Hopeless 0 0  PHQ - 2 Score 0 0  Altered sleeping 0 0  Tired, decreased energy 1 1  Change in appetite  0  Feeling bad or failure about yourself  0 0  Trouble concentrating 0 0  Moving slowly or fidgety/restless 0 0  Suicidal thoughts 0 0  PHQ-9 Score 1 1  Difficult doing work/chores Not  difficult at all Not difficult at all    Quality of Life:  Quality of Life - 06/24/22 1440       Quality of Life   Select Quality of Life      Quality of Life Scores   Health/Function Pre 20.16 %    Health/Function Post 19.63 %    Health/Function % Change -2.63 %    Socioeconomic Pre 23.57 %    Socioeconomic Post 26.92 %    Socioeconomic % Change  14.21 %    Psych/Spiritual Pre 22.43 %    Psych/Spiritual Post 23.79 %    Psych/Spiritual % Change 6.06 %    Family Pre 28.8 %    Family Post 28.8 %    Family % Change 0 %    GLOBAL Pre 22.53 %    GLOBAL Post 23.12 %    GLOBAL % Change 2.62 %             Personal Goals: Goals established at orientation with interventions provided to work toward goal.  Personal Goals and Risk Factors at Admission - 02/20/22 1322       Core Components/Risk Factors/Patient Goals on Admission    Weight Management Yes;Obesity;Weight Loss    Intervention Obesity: Provide education and  appropriate resources to help participant work on and attain dietary goals.;Weight Management/Obesity: Establish reasonable short term and long term weight goals.;Weight Management: Provide education and appropriate resources to help participant work on and attain dietary goals.;Weight Management: Develop a combined nutrition and exercise program designed to reach desired caloric intake, while maintaining appropriate intake of nutrient and fiber, sodium and fats, and appropriate energy expenditure required for the weight goal.    Expected Outcomes Short Term: Continue to assess and modify interventions until short term weight is achieved;Long Term: Adherence to nutrition and physical activity/exercise program aimed toward attainment of established weight goal;Weight Maintenance: Understanding of the daily nutrition guidelines, which includes 25-35% calories from fat, 7% or less cal from saturated fats, less than 200mg  cholesterol, less than 1.5gm of sodium, & 5 or more servings of fruits and vegetables daily;Weight Loss: Understanding of general recommendations for a balanced deficit meal plan, which promotes 1-2 lb weight loss per week and includes a negative energy balance of [phone] kcal/d;Understanding recommendations for meals to include 15-35% energy as protein, 25-35% energy from fat, 35-60% energy from carbohydrates, less than 200mg  of dietary cholesterol, 20-35 gm of total fiber daily;Understanding of distribution of calorie intake throughout the day with the consumption of 4-5 meals/snacks    Improve shortness of breath with ADL's Yes    Intervention Provide education, individualized exercise plan and daily activity instruction to help decrease symptoms of SOB with activities of daily living.    Expected Outcomes Short Term: Improve cardiorespiratory fitness to achieve a reduction of symptoms when performing ADLs;Long Term: Be able to perform more ADLs without symptoms or delay the onset of symptoms               Personal Goals Discharge:  Goals and Risk Factor Review     Row Name 02/26/22 1343 03/27/22 0750 04/23/22 0838 05/21/22 1555 06/27/22 0744     Core Components/Risk Factors/Patient Goals Review   Personal Goals Review Weight Management/Obesity;Other;Improve shortness of breath with ADL's Weight Management/Obesity;Other;Improve shortness of breath with ADL's Weight Management/Obesity;Other;Improve shortness of breath with ADL's Weight Management/Obesity;Other;Improve shortness of breath with ADL's Weight Management/Obesity;Other;Improve shortness of breath with ADL's   Review Patient was referred to PR with ILD. He has  completed 1 session. His weight is 126.6 KG. He exercises on RA with O2 sats at 92-95%. His persoanl goals are to decrease his SOB with exertion and lose weight. We will continue to monitor his progress as he works towards meeting these goal. Patient has completed 7 session. His weight is 123.4 KG down 3.2 KG from last 30 day review. He continues to exercise on RA with O2 sats at 95-96% which has improved. His persoanl goals for the program are to decrease his SOB with exertion and lose weight. We will continue to monitor his progress as he works towards meeting these goal. Patient has completed 13 sessions.  His weight is 123.8 kg, down from his initial weight of 126.6 kg.  He continues to exercise on RA with 02 sats from 92-96%.  His personal goals for the program are to decrease his SOB with exertion and lose weight.  We will continue to monitor his progress as he works toward meeting these goals. Pt has completed 19 sessions.  His current weight is 125 kg.  He continues to exercise on RA with 02 sats ranging from 92-96%.  His blood pressure has been elevated at rest intermittently, with his systolic BP ranging from the 130-140s.  During exercise his BP has also been elevated intermittently with his highest blood pressure at 146/78.  He continues to have chronic pain in his  left hip, but he is able to exercise on the Nustep.  His goals for the program are to decrease his shortness of breath with activity and lose weight.  We will continue to monitor his progress as he works toward meeting these goals. Pt graduated from PR after 27 sessions. He lost 4 lbs while he was in the program. His HR and SpO2 were WNLs during his time here. He did continuously have an elevated systolic blood pressure at rest. However, his SBP did not increase much with exercise, only getting into the high 140s. His progress in the program was limted by his left hip pain. He is not able to do the treadmill due to this, and he was only able to do the stepper. He does report decreases in his SOB.   Expected Outcomes Patient will complete the program meeting both perosnal and program goals. Patient will complete the program meeting both perosnal and program goals. Patient will complete the program meeting both perosnal and program goals. Patient will complete the program meeting both personal and program goals. Pt will continue to work towards their goals post discharge.            Exercise Goals and Review:  Exercise Goals     Row Name 02/20/22 1319 03/04/22 1149 04/01/22 1305 04/29/22 1310 05/28/22 1228     Exercise Goals   Increase Physical Activity Yes Yes Yes Yes Yes   Intervention Provide advice, education, support and counseling about physical activity/exercise needs.;Develop an individualized exercise prescription for aerobic and resistive training based on initial evaluation findings, risk stratification, comorbidities and participant's personal goals. Provide advice, education, support and counseling about physical activity/exercise needs.;Develop an individualized exercise prescription for aerobic and resistive training based on initial evaluation findings, risk stratification, comorbidities and participant's personal goals. Provide advice, education, support and counseling about physical  activity/exercise needs.;Develop an individualized exercise prescription for aerobic and resistive training based on initial evaluation findings, risk stratification, comorbidities and participant's personal goals. Provide advice, education, support and counseling about physical activity/exercise needs.;Develop an individualized exercise prescription for aerobic and resistive training based  on initial evaluation findings, risk stratification, comorbidities and participant's personal goals. Provide advice, education, support and counseling about physical activity/exercise needs.;Develop an individualized exercise prescription for aerobic and resistive training based on initial evaluation findings, risk stratification, comorbidities and participant's personal goals.   Expected Outcomes Short Term: Attend rehab on a regular basis to increase amount of physical activity.;Long Term: Add in home exercise to make exercise part of routine and to increase amount of physical activity.;Long Term: Exercising regularly at least 3-5 days a week. Short Term: Attend rehab on a regular basis to increase amount of physical activity.;Long Term: Add in home exercise to make exercise part of routine and to increase amount of physical activity.;Long Term: Exercising regularly at least 3-5 days a week. Short Term: Attend rehab on a regular basis to increase amount of physical activity.;Long Term: Add in home exercise to make exercise part of routine and to increase amount of physical activity.;Long Term: Exercising regularly at least 3-5 days a week. Short Term: Attend rehab on a regular basis to increase amount of physical activity.;Long Term: Add in home exercise to make exercise part of routine and to increase amount of physical activity.;Long Term: Exercising regularly at least 3-5 days a week. Short Term: Attend rehab on a regular basis to increase amount of physical activity.;Long Term: Add in home exercise to make exercise part of  routine and to increase amount of physical activity.;Long Term: Exercising regularly at least 3-5 days a week.   Increase Strength and Stamina Yes Yes Yes Yes Yes   Intervention Provide advice, education, support and counseling about physical activity/exercise needs.;Develop an individualized exercise prescription for aerobic and resistive training based on initial evaluation findings, risk stratification, comorbidities and participant's personal goals. Provide advice, education, support and counseling about physical activity/exercise needs.;Develop an individualized exercise prescription for aerobic and resistive training based on initial evaluation findings, risk stratification, comorbidities and participant's personal goals. Provide advice, education, support and counseling about physical activity/exercise needs.;Develop an individualized exercise prescription for aerobic and resistive training based on initial evaluation findings, risk stratification, comorbidities and participant's personal goals. Provide advice, education, support and counseling about physical activity/exercise needs.;Develop an individualized exercise prescription for aerobic and resistive training based on initial evaluation findings, risk stratification, comorbidities and participant's personal goals. Provide advice, education, support and counseling about physical activity/exercise needs.;Develop an individualized exercise prescription for aerobic and resistive training based on initial evaluation findings, risk stratification, comorbidities and participant's personal goals.   Expected Outcomes Short Term: Increase workloads from initial exercise prescription for resistance, speed, and METs.;Short Term: Perform resistance training exercises routinely during rehab and add in resistance training at home;Long Term: Improve cardiorespiratory fitness, muscular endurance and strength as measured by increased METs and functional capacity  ( ) Short Term: Increase workloads from initial exercise prescription for resistance, speed, and METs.;Short Term: Perform resistance training exercises routinely during rehab and add in resistance training at home;Long Term: Improve cardiorespiratory fitness, muscular endurance and strength as measured by increased METs and functional capacity ( ) Short Term: Increase workloads from initial exercise prescription for resistance, speed, and METs.;Short Term: Perform resistance training exercises routinely during rehab and add in resistance training at home;Long Term: Improve cardiorespiratory fitness, muscular endurance and strength as measured by increased METs and functional capacity ( ) Short Term: Increase workloads from initial exercise prescription for resistance, speed, and METs.;Short Term: Perform resistance training exercises routinely during rehab and add in resistance training at home;Long Term: Improve cardiorespiratory fitness, muscular endurance and strength as measured by  increased METs and functional capacity ( ) Short Term: Increase workloads from initial exercise prescription for resistance, speed, and METs.;Short Term: Perform resistance training exercises routinely during rehab and add in resistance training at home;Long Term: Improve cardiorespiratory fitness, muscular endurance and strength as measured by increased METs and functional capacity ( )   Able to understand and use rate of perceived exertion (RPE) scale Yes Yes Yes Yes Yes   Intervention Provide education and explanation on how to use RPE scale Provide education and explanation on how to use RPE scale Provide education and explanation on how to use RPE scale Provide education and explanation on how to use RPE scale Provide education and explanation on how to use RPE scale   Expected Outcomes Short Term: Able to use RPE daily in rehab to express subjective intensity level;Long Term:  Able to use RPE to guide  intensity level when exercising independently Short Term: Able to use RPE daily in rehab to express subjective intensity level;Long Term:  Able to use RPE to guide intensity level when exercising independently Short Term: Able to use RPE daily in rehab to express subjective intensity level;Long Term:  Able to use RPE to guide intensity level when exercising independently Short Term: Able to use RPE daily in rehab to express subjective intensity level;Long Term:  Able to use RPE to guide intensity level when exercising independently Short Term: Able to use RPE daily in rehab to express subjective intensity level;Long Term:  Able to use RPE to guide intensity level when exercising independently   Able to understand and use Dyspnea scale Yes Yes Yes Yes Yes   Intervention Provide education and explanation on how to use Dyspnea scale Provide education and explanation on how to use Dyspnea scale Provide education and explanation on how to use Dyspnea scale Provide education and explanation on how to use Dyspnea scale Provide education and explanation on how to use Dyspnea scale   Expected Outcomes Short Term: Able to use Dyspnea scale daily in rehab to express subjective sense of shortness of breath during exertion;Long Term: Able to use Dyspnea scale to guide intensity level when exercising independently Short Term: Able to use Dyspnea scale daily in rehab to express subjective sense of shortness of breath during exertion;Long Term: Able to use Dyspnea scale to guide intensity level when exercising independently Short Term: Able to use Dyspnea scale daily in rehab to express subjective sense of shortness of breath during exertion;Long Term: Able to use Dyspnea scale to guide intensity level when exercising independently Short Term: Able to use Dyspnea scale daily in rehab to express subjective sense of shortness of breath during exertion;Long Term: Able to use Dyspnea scale to guide intensity level when exercising  independently Short Term: Able to use Dyspnea scale daily in rehab to express subjective sense of shortness of breath during exertion;Long Term: Able to use Dyspnea scale to guide intensity level when exercising independently   Knowledge and understanding of Target Heart Rate Range (THRR) Yes Yes Yes Yes Yes   Intervention Provide education and explanation of THRR including how the numbers were predicted and where they are located for reference Provide education and explanation of THRR including how the numbers were predicted and where they are located for reference Provide education and explanation of THRR including how the numbers were predicted and where they are located for reference Provide education and explanation of THRR including how the numbers were predicted and where they are located for reference Provide education and explanation of THRR  including how the numbers were predicted and where they are located for reference   Expected Outcomes Short Term: Able to state/look up THRR;Short Term: Able to use daily as guideline for intensity in rehab;Long Term: Able to use THRR to govern intensity when exercising independently Short Term: Able to state/look up THRR;Short Term: Able to use daily as guideline for intensity in rehab;Long Term: Able to use THRR to govern intensity when exercising independently Short Term: Able to state/look up THRR;Short Term: Able to use daily as guideline for intensity in rehab;Long Term: Able to use THRR to govern intensity when exercising independently Short Term: Able to state/look up THRR;Short Term: Able to use daily as guideline for intensity in rehab;Long Term: Able to use THRR to govern intensity when exercising independently Short Term: Able to state/look up THRR;Short Term: Able to use daily as guideline for intensity in rehab;Long Term: Able to use THRR to govern intensity when exercising independently   Understanding of Exercise Prescription Yes Yes Yes Yes Yes    Intervention Provide education, explanation, and written materials on patient's individual exercise prescription Provide education, explanation, and written materials on patient's individual exercise prescription Provide education, explanation, and written materials on patient's individual exercise prescription Provide education, explanation, and written materials on patient's individual exercise prescription Provide education, explanation, and written materials on patient's individual exercise prescription   Expected Outcomes Short Term: Able to explain program exercise prescription;Long Term: Able to explain home exercise prescription to exercise independently Short Term: Able to explain program exercise prescription;Long Term: Able to explain home exercise prescription to exercise independently Short Term: Able to explain program exercise prescription;Long Term: Able to explain home exercise prescription to exercise independently Short Term: Able to explain program exercise prescription;Long Term: Able to explain home exercise prescription to exercise independently Short Term: Able to explain program exercise prescription;Long Term: Able to explain home exercise prescription to exercise independently            Exercise Goals Re-Evaluation:  Exercise Goals Re-Evaluation     Row Name 03/04/22 1150 04/01/22 1305 04/29/22 1310 05/28/22 1229       Exercise Goal Re-Evaluation   Exercise Goals Review Increase Physical Activity;Increase Strength and Stamina;Able to understand and use rate of perceived exertion (RPE) scale;Able to understand and use Dyspnea scale;Knowledge and understanding of Target Heart Rate Range (THRR);Understanding of Exercise Prescription Increase Physical Activity;Increase Strength and Stamina;Able to understand and use rate of perceived exertion (RPE) scale;Able to understand and use Dyspnea scale;Knowledge and understanding of Target Heart Rate Range (THRR);Understanding of  Exercise Prescription Increase Physical Activity;Increase Strength and Stamina;Able to understand and use rate of perceived exertion (RPE) scale;Able to understand and use Dyspnea scale;Knowledge and understanding of Target Heart Rate Range (THRR);Understanding of Exercise Prescription Increase Physical Activity;Increase Strength and Stamina;Able to understand and use rate of perceived exertion (RPE) scale;Knowledge and understanding of Target Heart Rate Range (THRR);Able to understand and use Dyspnea scale;Able to check pulse independently    Comments Pt has completed 3 sessions of PR. He has L hip bursitis that bothers him when exercising. He was not able to use the treadmill due to the impact on his hip causing the bursitis pain to worsen. He is currently using the stepper both times and stated that his hip does not bother him when exercising. He has increased his  workload already and is currently exercising at 2.1 METs on the stepper. Will continue to monitor and progress as able. Pt has completed 9 sessions of PR. He  is tolerating exercise well on the stepper. He is still not able to walk on the treadmill due to his L hip bursitis. He recived a shot earlier this month and stated that it only helped his hip a little. He is currently exercising at 2.1 METs on the stepper. Will continue to monitor and progress as able. Pt has completed 15 sessions of PR. He is tolerating exercise well on the stepper and it is not aggervating his L hip bursitis. He is currently exercising at 2.1 METs on the stepper. Will continue to monitor and progress as able. Pt has completed 20 sessions of PR. He continues to have L hip pain due to bursitis, he had relief for a week after injection but the pain came back. He is currently exercising at 2.1 METs on the stepper. Will continue to monitor and progress as able.    Expected Outcomes Through exercise at rehab and home, the patient will meet their stated goals. Through exercise at  rehab and home, the patient will meet their stated goals. Through exercise at rehab and home, the patient will meet their stated goals. Through exercise at rehab and home, the patient will meet their stated goals.             Nutrition & Weight - Outcomes:  Pre Biometrics - 02/20/22 1320       Pre Biometrics   Height 5\' 10"  (1.778 m)    Weight 279 lb 1.6 oz (126.6 kg)    Waist Circumference 48.5 inches    Hip Circumference 50 inches    Waist to Hip Ratio 0.97 %    BMI (Calculated) 40.05    Triceps Skinfold 20 mm    % Body Fat 37.1 %    Grip Strength 44.3 kg    Flexibility 0 in    Single Leg Stand 6.68 seconds             Post Biometrics - 06/24/22 1439        Post  Biometrics   Height 5\' 10"  (1.778 m)    Weight 270 lb 8.1 oz (122.7 kg)    Waist Circumference 48 inches    Hip Circumference 48.5 inches    Waist to Hip Ratio 0.99 %    BMI (Calculated) 38.81    Triceps Skinfold 18 mm    % Body Fat 36.2 %    Grip Strength 43.2 kg    Flexibility 0 in    Single Leg Stand 22.56 seconds             Nutrition:  Nutrition Therapy & Goals - 06/27/22 0740       Nutrition Therapy   RD appointment deferred Yes      Personal Nutrition Goals   Comments Pt graduated from the program after 27 sessions. He deferred a dietary referral but was given 2 educational packets regarding diet. He did lose 4 lbs while in the program.      Intervention Plan   Intervention Nutrition handout(s) given to patient.    Expected Outcomes Short Term Goal: Understand basic principles of dietary content, such as calories, fat, sodium, cholesterol and nutrients.             Nutrition Discharge:  Nutrition Assessments - 06/26/22 1536       MEDFICTS Scores   Pre Score 50    Post Score 54    Score Difference 4             Education Questionnaire  Score:  Knowledge Questionnaire Score - 06/26/22 1531       Knowledge Questionnaire Score   Pre Score 17/18    Post Score  15/18             Goals reviewed with patient; copy given to patient. Pt graduated from PR after 27 sessions. He was able to improve his walk test distance by 3.7%, and his MET level at discharge was 2.2. 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

## 2022-07-14 ENCOUNTER — Other Ambulatory Visit: Payer: Self-pay | Admitting: Student

## 2022-07-17 DIAGNOSIS — Z6837 Body mass index (BMI) 37.0-37.9, adult: Secondary | ICD-10-CM | POA: Diagnosis not present

## 2022-07-17 DIAGNOSIS — J849 Interstitial pulmonary disease, unspecified: Secondary | ICD-10-CM | POA: Diagnosis not present

## 2022-07-17 DIAGNOSIS — J449 Chronic obstructive pulmonary disease, unspecified: Secondary | ICD-10-CM | POA: Diagnosis not present

## 2022-07-23 DIAGNOSIS — F419 Anxiety disorder, unspecified: Secondary | ICD-10-CM | POA: Diagnosis not present

## 2022-07-23 DIAGNOSIS — G4733 Obstructive sleep apnea (adult) (pediatric): Secondary | ICD-10-CM | POA: Diagnosis not present

## 2022-07-23 DIAGNOSIS — I10 Essential (primary) hypertension: Secondary | ICD-10-CM | POA: Diagnosis not present

## 2022-07-30 ENCOUNTER — Telehealth: Payer: Self-pay | Admitting: Pulmonary Disease

## 2022-07-30 NOTE — Telephone Encounter (Signed)
PT taking Ofev. States he has very bad diarrhea. He wonders if this is typical of this medication. Please call to advise @ [phone]

## 2022-07-30 NOTE — Telephone Encounter (Signed)
Returned call to patient regarding new symptoms of diarrhea. He went on vacation last week and diarrhea persisted but has diarrhea before leaving for vacation too. Denies any changes to diet or hydration status recently. Reports about 25-lb weight loss since starting Ofev. He states he has tolerated Ofev really well thus far and is unsure how diarrhea was triggered. He has been taking Imodium which has only minimally eased the loose stools.  Of note, he has not restarted the Myfortic so has been off of this as well. Diarrhea likely not attributable to this.  Plan: - Hold Ofev x 2 weeks and restart 150mg  twice daily if diarrhea resolved. If recurrent diarrhea, will likely need to decrease Ofev to 100mg  twice daily. - Restart Myfortic thereafter once Ofev is tolerated.  Chesley Mires, PharmD, MPH, BCPS, CPP Clinical Pharmacist (Rheumatology and Pulmonology)

## 2022-08-04 ENCOUNTER — Ambulatory Visit: Payer: Medicare HMO | Admitting: Medical

## 2022-08-05 DIAGNOSIS — L309 Dermatitis, unspecified: Secondary | ICD-10-CM | POA: Diagnosis not present

## 2022-08-09 ENCOUNTER — Ambulatory Visit
Admission: EM | Admit: 2022-08-09 | Discharge: 2022-08-09 | Disposition: A | Discharge status: Home / Self Care | Payer: Medicare HMO

## 2022-08-09 DIAGNOSIS — B029 Zoster without complications: Secondary | ICD-10-CM

## 2022-08-09 MED ORDER — GABAPENTIN 100 MG PO CAPS: 100.0000 mg | ORAL_CAPSULE | Freq: Three times a day (TID) | ORAL | 0 refills | Status: DC

## 2022-08-09 MED ORDER — VALACYCLOVIR HCL 1 G PO TABS: 1000.0000 mg | ORAL_TABLET | Freq: Three times a day (TID) | ORAL | 0 refills | Status: AC

## 2022-08-09 MED ORDER — PREDNISONE 20 MG PO TABS: 40.0000 mg | ORAL_TABLET | Freq: Every day | ORAL | 0 refills | Status: AC

## 2022-08-09 NOTE — ED Provider Notes (Addendum)
[facility]    CSN: [id number] Arrival date & time: 08/09/22  1417      History   Chief Complaint No chief complaint on file.   HPI Alexander Nunez is a 77 y.o. male.   The history is provided by the patient.   The patient presents for complaints of rash has been present for the past 4 to 5 days.  Patient states the rash is under the right breast and extends under the right arm.  He states that he also has a rash to the right upper portion of his arm.  He states that the rash feels like a "burning and stabbing" pain.  He states on the right side of his mid back, the area feels "tingling".  Patient denies fever, chills, exposure to new soaps, medications, lotions, detergents, or foods.  Patient reports he has been using triamcinolone cream and alclometasone with minimal relief of his symptoms.  Patient reports that he did have chickenpox as a child.  He has not been vaccinated for shingles.  Past Medical History:  Diagnosis Date   Anxiety    Arthritis    back   Back pain, chronic    Colon polyp    Diverticulitis    GERD (gastroesophageal reflux disease)    Heart palpitations    High cholesterol    Hypertension    Irritable bowel syndrome    Osteoporosis    Psoriasis    Pulmonary fibrosis (HCC)    Sleep apnea    c- pap    Patient Active Problem List   Diagnosis Date Noted   Lower respiratory tract infection 05/10/2021   Acute rhinosinusitis 05/10/2021   Thyroid mass 04/05/2021   Palpitations 04/05/2021   CAP (community acquired pneumonia) 03/26/2021   Chest pain 03/25/2021   ILD (interstitial lung disease) (HCC) 05/18/2019   Shortness of breath 05/18/2019   Allergic rhinitis 05/18/2019   Atypical chest pain 05/04/2014   Sleep apnea 05/04/2014   Abdominal bloating 07/22/2013   Spermatocele 05/02/2013   Chronic prostatitis 01/31/2013   Pain in testicle 01/31/2013   Weakness of left leg 08/12/2012   Generalized abdominal pain 09/02/2011   Lumbar canal  stenosis 05/28/2011   Degenerative arthritis of lumbar spine 05/28/2011   SPL (spondylolisthesis) 05/28/2011   PERSONAL HX COLONIC POLYPS 04/19/2009   IRRITABLE BOWEL SYNDROME 04/06/2008   ABDOMINAL BLOATING 03/07/2008   CHANGE IN BOWELS 03/07/2008   ABDOMINAL PAIN-MULTIPLE SITES 03/07/2008   Essential hypertension 03/06/2008   GERD 03/06/2008   GASTRITIS 03/06/2008   CHRONIC RESPIRATORY DISEASE ARISE PERINTL PERIOD 03/06/2008   Hyperlipidemia 02/06/2003    Past Surgical History:  Procedure Laterality Date   BACK SURGERY  2015   CHOLECYSTECTOMY N/A 10/12/2015   Procedure: LAPAROSCOPIC CHOLECYSTECTOMY;  Surgeon: Franky Macho, MD;  Location: [facility];  Service: General;  Laterality: N/A;   COLONOSCOPY     FOOT FRACTURE SURGERY     left foot   KNEE ARTHROSCOPY     left   TONSILLECTOMY     UPPER GASTROINTESTINAL ENDOSCOPY         Home Medications    Prior to Admission medications   Medication Sig Start Date End Date Taking? Authorizing Provider  alclomethasone (ACLOVATE) 0.05 % cream Apply 1 Application topically 2 (two) times daily. 05/20/22  Yes [provider]  gabapentin (NEURONTIN) 100 MG capsule Take 1 capsule (100 mg total) by mouth 3 (three) times daily. 08/09/22  Yes Emmanuel Gruenhagen-Warren, Sadie Haber, NP  predniSONE (DELTASONE) 20  MG tablet Take 2 tablets (40 mg total) by mouth daily with breakfast for 5 days. 08/09/22 08/14/22 Yes Zamir Staples-Warren, Sadie Haber, NP  valACYclovir (VALTREX) 1000 MG tablet Take 1 tablet (1,000 mg total) by mouth 3 (three) times daily for 7 days. 08/09/22 08/16/22 Yes Khaila Velarde-Warren, Sadie Haber, NP  albuterol (VENTOLIN HFA) 108 (90 Base) MCG/ACT inhaler Inhale 1-2 puffs into the lungs every 4 (four) hours as needed for shortness of breath or wheezing. 11/14/21   Lupita Leash, MD  ALPRAZolam Prudy Feeler) 0.5 MG tablet Take 0.25-0.5 mg by mouth 3 (three) times daily as needed for sleep.    [provider]  Azelastine HCl 137 MCG/SPRAY SOLN PLACE 2  SPRAYS INTO BOTH NOSTRILS 2 TIMES DAILY AS NEEDED FOR RHINITIS. 02/04/22   Allison Quarry, Ruby Cola, NP  doxazosin (CARDURA) 4 MG tablet Take 4 mg by mouth at bedtime.      [provider]  fluticasone (FLONASE) 50 MCG/ACT nasal spray SPRAY 2 SPRAYS INTO EACH NOSTRIL EVERY DAY 07/14/22   [name], Praveen, MD  losartan (COZAAR) 25 MG tablet Take 25 mg by mouth daily. 08/28/21   [provider]  metoprolol tartrate (LOPRESSOR) 25 MG tablet Take 25 mg by mouth 2 (two) times daily.    [provider]  Nintedanib (OFEV) 150 MG CAPS Take 1 capsule (150 mg total) by mouth 2 (two) times daily. 01/28/22   [name], Colbert Coyer, MD  pantoprazole (PROTONIX) 40 MG tablet Take 40 mg by mouth daily.    [provider]  pravastatin (PRAVACHOL) 20 MG tablet Take 20 mg by mouth every evening.     [provider]    Family History Family History  Problem Relation Age of Onset   Arrhythmia Mother        Atrial fib   Arrhythmia Sister        Atrial fib   Stroke Sister    Breast cancer Sister    Bladder Cancer Father            Colon cancer Neg Hx    Stomach cancer Neg Hx    Esophageal cancer Neg Hx    Pancreatic cancer Neg Hx    Prostate cancer Neg Hx    Rectal cancer Neg Hx     Social History Social History   Tobacco Use   Smoking status: Former    Packs/day: 2.00    Years: 11.00    Additional pack years: 0.00    Total pack years: 22.00    Types: Cigarettes    Start date: 05/22/1962    Quit date: 05/21/1973    Years since quitting: 49.2   Smokeless tobacco: Never   Tobacco comments:    Quit 25 yrs now   Vaping Use   Vaping Use: Never used  Substance Use Topics   Alcohol use: Yes    Alcohol/week: 14.0 standard drinks of alcohol    Types: 14 Shots of liquor per week    Comment: social   Drug use: No     Allergies   Cephalexin and Contrast media [iodinated contrast media]   Review of Systems Review of Systems Per HPI  Physical Exam Triage Vital  Signs ED Triage Vitals  Enc Vitals Group     BP 08/09/22 1420 132/68     Pulse Rate 08/09/22 1420 70     Resp 08/09/22 1420 14     Temp 08/09/22 1420 97.9 F (36.6 C)     Temp Source 08/09/22 1420 Oral  SpO2 08/09/22 1420 93 %     Weight --      Height --      Head Circumference --      Peak Flow --      Pain Score 08/09/22 1424 6     Pain Loc --      Pain Edu? --      Excl. in GC? --    No data found.  Updated Vital Signs BP 132/68 (BP Location: Right Arm)   Pulse 70   Temp 97.9 F (36.6 C) (Oral)   Resp 14   SpO2 93%   Visual Acuity Right Eye Distance:   Left Eye Distance:   Bilateral Distance:    Right Eye Near:   Left Eye Near:    Bilateral Near:     Physical Exam Vitals and nursing note reviewed.  Constitutional:      General: He is not in acute distress.    Appearance: Normal appearance.  Eyes:     Extraocular Movements: Extraocular movements intact.     Pupils: Pupils are equal, round, and reactive to light.  Pulmonary:     Effort: Pulmonary effort is normal.  Musculoskeletal:     Cervical back: Normal range of motion.  Skin:    General: Skin is warm and dry.     Findings: Rash present.     Comments: Herpetiform erythematous rash that appears as a band noted under the right breast and extends to the right side.  Erythematous patch noted to the medial aspect of the right upper arm.  There is no blistering, oozing, or drainage present.  Neurological:     General: No focal deficit present.     Mental Status: He is alert and oriented to person, place, and time.  Psychiatric:        Mood and Affect: Mood normal.        Behavior: Behavior normal.      UC Treatments / Results  Labs (all labs ordered are listed, but only abnormal results are displayed) Labs Reviewed - No data to display  EKG   Radiology No results found.  Procedures Procedures (including critical care time)  Medications Ordered in UC Medications - No data to  display  Initial Impression / Assessment and Plan / UC Course  I have reviewed the triage vital signs and the nursing notes.  Pertinent labs & imaging results that were available during my care of the patient were reviewed by me and considered in my medical decision making (see chart for details).  The patient is well-appearing, he is in no acute distress, vital signs are stable.  Rash appears to be consistent with shingles.  Drug interaction checker used to ensure that valacyclovir is safe to take with O FEV, no contraindications were found.  Valacyclovir 1 g  TID for the next 7 days was prescribed for the rash, prednisone 40 mg for the next 5 days was prescribed to help with itching and inflammation, and gabapentin 100 mg 3 times daily was prescribed to help with nerve pain.  Supportive care recommendations were provided and discussed with the patient to include keeping the area covered, applying cool cloths for itching or discomfort, and avoiding rubbing or scratching the areas while symptoms persist.  Patient was advised to contact his pulmonologist as he has an upcoming appointment to ensure it is safe for him to attend this appointment.  Patient was advised if symptoms do not improve with this treatment, would like for  him to follow-up with his primary care physician for further evaluation.  Patient also advised to follow-up with his primary care physician to schedule the shingles vaccine once his symptoms resolved.  Patient is in agreement with this plan of care and verbalizes understanding.  All questions were answered.  Patient stable for discharge.   Final Clinical Impressions(s) / UC Diagnoses   Final diagnoses:  Herpes zoster without complication     Discharge Instructions      Take medication as prescribed. May take over-the-counter Tylenol as needed for pain, fever, or general discomfort. Increase fluids and allow for plenty of rest. Avoid scratching or rubbing the areas while  symptoms persist. Apply cool cloth to the area to help with pain, itching, or general discomfort. Avoid hot baths or showers, recommend taking lukewarm baths while symptoms are present. If the areas begin to blister and issues, make sure they are covered with a loose dressing or bandage. As discussed, please follow-up with your pulmonologist office to let them know of your diagnosis regarding your appointment. If symptoms fail to improve with this treatment, please follow-up with your primary care physician for further evaluation. Follow-up as needed.     ED Prescriptions     Medication Sig Dispense Auth. Provider   valACYclovir (VALTREX) 1000 MG tablet Take 1 tablet (1,000 mg total) by mouth 3 (three) times daily for 7 days. 21 tablet Hiral Lukasiewicz-Warren, Sadie Haber, NP   gabapentin (NEURONTIN) 100 MG capsule Take 1 capsule (100 mg total) by mouth 3 (three) times daily. 10 capsule Vashaun Osmon-Warren, Sadie Haber, NP   predniSONE (DELTASONE) 20 MG tablet Take 2 tablets (40 mg total) by mouth daily with breakfast for 5 days. 10 tablet Thatiana Renbarger-Warren, Sadie Haber, NP      PDMP not reviewed this encounter.   Abran Cantor, NP 08/09/22 1450    Emberly Tomasso-Warren, Sadie Haber, NP 08/09/22 1451

## 2022-08-09 NOTE — ED Triage Notes (Signed)
Pt c/o rash under the right breast down the right arm, x 4 days. Rash is causing burning and itching   Pt has been using both triamcinolone 0.1$ cream and Alclometasone  Dipro cream 0.05%. has not really helped with rash sx's.

## 2022-08-09 NOTE — Discharge Instructions (Signed)
Take medication as prescribed. May take over-the-counter Tylenol as needed for pain, fever, or general discomfort. Increase fluids and allow for plenty of rest. Avoid scratching or rubbing the areas while symptoms persist. Apply cool cloth to the area to help with pain, itching, or general discomfort. Avoid hot baths or showers, recommend taking lukewarm baths while symptoms are present. If the areas begin to blister and issues, make sure they are covered with a loose dressing or bandage. As discussed, please follow-up with your pulmonologist office to let them know of your diagnosis regarding your appointment. If symptoms fail to improve with this treatment, please follow-up with your primary care physician for further evaluation. Follow-up as needed.

## 2022-08-13 ENCOUNTER — Ambulatory Visit: Payer: Medicare HMO | Admitting: Pulmonary Disease

## 2022-08-13 ENCOUNTER — Encounter: Payer: Self-pay | Admitting: Pulmonary Disease

## 2022-08-13 VITALS — BP 132/68 | HR 55 | Temp 97.9°F | Ht 71.0 in | Wt 272.0 lb

## 2022-08-13 DIAGNOSIS — G4733 Obstructive sleep apnea (adult) (pediatric): Secondary | ICD-10-CM | POA: Diagnosis not present

## 2022-08-13 DIAGNOSIS — Z5181 Encounter for therapeutic drug level monitoring: Secondary | ICD-10-CM | POA: Diagnosis not present

## 2022-08-13 DIAGNOSIS — J849 Interstitial pulmonary disease, unspecified: Secondary | ICD-10-CM

## 2022-08-13 LAB — COMPREHENSIVE METABOLIC PANEL
ALT: 15 U/L (ref 0–53)
AST: 16 U/L (ref 0–37)
Albumin: 3.6 g/dL (ref 3.5–5.2)
Alkaline Phosphatase: 54 U/L (ref 39–117)
BUN: 14 mg/dL (ref 6–23)
CO2: 29 mEq/L (ref 19–32)
Calcium: 8.6 mg/dL (ref 8.4–10.5)
Chloride: 104 mEq/L (ref 96–112)
Creatinine, Ser: 1.09 mg/dL (ref 0.40–1.50)
GFR: 66.03 mL/min (ref >60.00)
Glucose, Bld: 87 mg/dL (ref 70–99)
Potassium: 3.9 mEq/L (ref 3.5–5.1)
Sodium: 141 mEq/L (ref 135–145)
Total Bilirubin: 0.6 mg/dL (ref 0.2–1.2)
Total Protein: 6.6 g/dL (ref 6.0–8.3)

## 2022-08-13 LAB — CBC WITH DIFFERENTIAL/PLATELET
Basophils Absolute: 0.1 10*3/uL (ref 0.0–0.1)
Basophils Relative: 1.3 % (ref 0.0–3.0)
Eosinophils Absolute: 0.4 10*3/uL (ref 0.0–0.7)
Eosinophils Relative: 4.6 % (ref 0.0–5.0)
HCT: 36.7 % — ABNORMAL LOW (ref 39.0–52.0)
Hemoglobin: 12.4 g/dL — ABNORMAL LOW (ref 13.0–17.0)
Lymphocytes Relative: 25.3 % (ref 12.0–46.0)
Lymphs Abs: 2.4 10*3/uL (ref 0.7–4.0)
MCHC: 33.9 g/dL (ref 30.0–36.0)
MCV: 95 fl (ref 78.0–100.0)
Monocytes Absolute: 1.1 10*3/uL — ABNORMAL HIGH (ref 0.1–1.0)
Monocytes Relative: 12.1 % — ABNORMAL HIGH (ref 3.0–12.0)
Neutro Abs: 5.3 10*3/uL (ref 1.4–7.7)
Neutrophils Relative %: 56.7 % (ref 43.0–77.0)
Platelets: 282 10*3/uL (ref 150.0–400.0)
RBC: 3.86 Mil/uL — ABNORMAL LOW (ref 4.22–5.81)
RDW: 14.4 % (ref 11.5–15.5)
WBC: 9.4 10*3/uL (ref 4.0–10.5)

## 2022-08-13 MED ORDER — AZATHIOPRINE 50 MG PO TABS: 50.0000 mg | ORAL_TABLET | Freq: Every day | ORAL | 5 refills | Status: DC

## 2022-08-13 MED ORDER — IVERMECTIN 3 MG PO TABS: 600.0000 ug/kg | ORAL_TABLET | Freq: Every day | ORAL | 0 refills | Status: AC

## 2022-08-13 NOTE — Progress Notes (Signed)
Alexander Nunez    [id number]    14-Sep-1945  Primary Care Physician:Fusco, Lyman Bishop, MD  Referring Physician: Elfredia Nevins, MD [address] [place],  [place] [address]  Chief complaint: Follow-up for ILD, pulmonary fibrosis  HPI: 77 y.o. with history of hypertension, hyperlipidemia, sleep apnea, psoriasis  2020 Had an incidental finding of interstitial lung disease on CT abdomen which was done for diverticulitis He has seen Dr. Shaune Pollack at [facility] who did a high resolution CT with findings of alternate diagnosis, possible HP.  He had HP panel on 12/28/2018 which is negative. History notable for psoriasis with skin involvement.  He was on methotrexate for a couple of years and stopped as his skin rash improved.  Last dose of methotrexate was several years ago.  2021 Had COVID-19 infection December 2021, referred for monoclonal antibody  2023 Discussed at multidisciplinary conference on 10/15/2021 CTs show progressive interstitial lung disease and alternate pattern. Differential diagnoses include chronic HP versus NSIP.  Recommend a biopsy and would favor surgical lung biopsy as transbronchial and BAL may not give a definitive diagnosis. MDC see also recommends empiric treatment with antifibrotic's +/- anti-inflammatory if pathology is suggestive.  Pets: Has a dog.  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  His hobbies include gardening but no significant exposure to mold,No hot tub, Jacuzzi, no down pillows or comforters.  He uses talcum powder every day.  May have had Freon leak from his HVAC system. Likes to play golf.   Smoking history: 30-pack-year smoker.  Quit in 1980 Travel history: No significant travel history Relevant family history: No significant family history  of lung disease.  Interval history: Clarnce Flock in first week of Jan 2023.  He is tolerating it well so far with no issue Has started pulmonary rehab and is doing well States that breathing is better.  Started on CellCept in February 2024.  He developed rash on his skin immediately after.  He was treated with antifungal cream and discontinuation of CellCept with improvement in symptoms.  The symptoms recurred after he rechallenge himself with CellCept and is now completely off the medication. Started my Mycorific in April 2024 this was also poorly tolerated with weight loss and fatigue and he is off this medication as well Continues on Ofev.  Outpatient Encounter Medications as of 08/13/2022  Medication Sig   albuterol (VENTOLIN HFA) 108 (90 Base) MCG/ACT inhaler Inhale 1-2 puffs into the lungs every 4 (four) hours as needed for shortness of breath or wheezing.   alclomethasone (ACLOVATE) 0.05 % cream Apply 1 Application topically 2 (two) times daily.   ALPRAZolam (XANAX) 0.5 MG tablet Take 0.25-0.5 mg by mouth 3 (three) times daily as needed for sleep.   Azelastine HCl 137 MCG/SPRAY SOLN PLACE 2 SPRAYS INTO BOTH NOSTRILS 2 TIMES DAILY AS NEEDED FOR RHINITIS.   doxazosin (CARDURA) 4 MG tablet Take 4 mg by mouth at bedtime.     fluticasone (FLONASE) 50 MCG/ACT nasal spray SPRAY 2 SPRAYS INTO EACH NOSTRIL EVERY DAY   gabapentin (NEURONTIN) 100 MG capsule Take 1 capsule (100 mg total) by mouth 3 (three) times daily.   losartan (COZAAR) 25 MG tablet Take 25 mg by mouth daily.   metoprolol tartrate (LOPRESSOR) 25 MG tablet Take 25 mg by mouth 2 (two) times daily.  Nintedanib (OFEV) 150 MG CAPS Take 1 capsule (150 mg total) by mouth 2 (two) times daily.   pantoprazole (PROTONIX) 40 MG tablet Take 40 mg by mouth daily.   pravastatin (PRAVACHOL) 20 MG tablet Take 20 mg by mouth every evening.    predniSONE (DELTASONE) 20 MG tablet Take 2 tablets (40 mg total) by mouth daily with breakfast for 5  days.   valACYclovir (VALTREX) 1000 MG tablet Take 1 tablet (1,000 mg total) by mouth 3 (three) times daily for 7 days.   No facility-administered encounter medications on file as of 08/13/2022.   Physical Exam: Blood pressure 136/80, pulse 61, temperature 98.1 F (36.7 C), temperature source Oral, height 5\' 11"  (1.803 m), weight 272 lb (123.4 kg), SpO2 96 %. Gen:      No acute distress HEENT:  EOMI, sclera anicteric Neck:     No masses; no thyromegaly Lungs:    Clear to auscultation bilaterally; normal respiratory effort CV:         Regular rate and rhythm; no murmurs Abd:      + bowel sounds; soft, non-tender; no palpable masses, no distension Ext:    No edema; adequate peripheral perfusion Skin:      Warm and dry; no rash Neuro: alert and oriented x 3 Psych: normal mood and affect   Data Reviewed: Imaging: High-resolution CT 12/24/2018-mild emphysema, traction bronchiectasis, patchy groundglass and reticulation.  More prominent in the upper lobes with no basal gradient.  Air-trapping present. High-res CT 09/19/2019-stable to minimally changed interstitial lung disease in alternate pattern  CT 03/25/2021-interstitial lung disease with superimposed groundglass opacities in the right upper and left lower lobe.  High-resolution CT 09/12/2021-upper lobe predominant fibrotic interstitial lung disease and alternate pattern. I have reviewed the images personally.  PFTs: 01/06/2019 FVC 3 [66%], FEV1 2.61 [78%], F/F 87, TLC 4.33 [59%], DLCO 22.56 [85%] Severe restriction with no obstruction.  Diffusion capacity is normal  10/03/2019 FVC 2.90 [64%], FEV1 2.47 [94%], DLCO 21.35 [81%] Moderate restriction  08/29/2020 FVC 2.14 [1%], FEV1 2.42 [74%], TLC 4.55 [62%], DLCO 19.75 [75%] Moderate restriction, mild diffusion defect  01/08/2022 FVC 2.29 [52%], FEV1 2.05 [64%], F/F89, TLC 3.70 [51%], DLCO 14.84 [57%]   Labs: Hypersensitivity panel 12/28/2018- Negative CTD profile 02/10/2019-  Negative  Sleep: Home sleep study 08/03/2021 Moderate OSA with AHI 28.6, low O2 sat of 81%.  Assessment:  Interstitial lung disease CT scan reviewed with alternate pattern of pulmonary fibrosis with upper lobe predominance.  This could be hypersensitivity pneumonitis though he does not have any significant exposures  Lab work including CTD serologies, hypersensitivity panel is negative.  Has remote exposure to asbestos but CT scan is not typical of asbestosis.  His CT may show slight progression.  We had multiple discussions including today for further work-up in detail with patient, including wait and watch with monitoring, bronchoscope with BAL, transbronchial biopsies or surgical lung biopsy.  He is very reluctant to undergo an invasive procedure. Per multidisciplinary conference recommendations for biopsy, treatment with antifibrotic's +/- anti-inflammatory.  After long discussion with patient and family we have decided to start treatment with antifibrotic's.   He is tolerating Ofev well so far.  CellCept tried earlier this year but developed a rash. Mycorific was poorly tolerated as well. Check baseline labs including CMP, CBC, TPMT for monitoring.  Start azathioprine at 50 mg/day Continue pulmonary rehab  Patient wants to try ivermectin  OSA He has diagnosis of OSA 30 years ago.  On auto CPAP. CPAP download  reviewed with good compliance and response with residual AHI of 2.2 Check night oximetry on CPAP to see if he needs supplemental oxygen  Obesity He has weight gain recently which is suspect is contributing to his restriction and dyspnea Advised weight loss with diet and exercise  Plan/Recommendations: - Check  labs for monitoring - Continue Ofev.  Start azathioprine - Pulmonary rehab. - Overnight oximetry on CPAP - CT and PFTs in 3 months  Chilton Greathouse MD [facility] 08/13/2022, 10:13 AM  CC: Elfredia Nevins, MD

## 2022-08-13 NOTE — Patient Instructions (Signed)
Will check overnight oximetry Check comprehensive metabolic panel, CBC and TPMT levels today Start azathioprine at 50 mcg/day Will do high-resolution CT in October to coincide with the PFTs that already scheduled Return to clinic in October after CT and PFTs

## 2022-08-14 DIAGNOSIS — I10 Essential (primary) hypertension: Secondary | ICD-10-CM | POA: Diagnosis not present

## 2022-08-14 DIAGNOSIS — G4733 Obstructive sleep apnea (adult) (pediatric): Secondary | ICD-10-CM | POA: Diagnosis not present

## 2022-08-15 ENCOUNTER — Other Ambulatory Visit: Payer: Self-pay | Admitting: Pulmonary Disease

## 2022-08-15 ENCOUNTER — Telehealth: Payer: Self-pay

## 2022-08-15 NOTE — Telephone Encounter (Signed)
*  Pulm  Submitted a Prior Authorization request to [facility] for  Ivermectin 3MG  tablets  via CoverMyMeds. Will update once we receive a response.  Key: [id number]

## 2022-08-18 NOTE — Telephone Encounter (Signed)
Full denial letter attached in patients media.

## 2022-08-21 LAB — THIOPURINE METHYLTRANSFERASE (TPMT), RBC: Thiopurine Methyltransferase, RBC: 15 nmol/hr/mL RBC

## 2022-08-27 ENCOUNTER — Encounter: Payer: Self-pay | Admitting: Student

## 2022-08-27 ENCOUNTER — Ambulatory Visit: Payer: Medicare HMO | Attending: Medical | Admitting: Student

## 2022-08-27 VITALS — BP 136/74 | HR 70 | Ht 71.0 in | Wt 263.0 lb

## 2022-08-27 DIAGNOSIS — R002 Palpitations: Secondary | ICD-10-CM

## 2022-08-27 DIAGNOSIS — J849 Interstitial pulmonary disease, unspecified: Secondary | ICD-10-CM

## 2022-08-27 DIAGNOSIS — E785 Hyperlipidemia, unspecified: Secondary | ICD-10-CM

## 2022-08-27 DIAGNOSIS — I10 Essential (primary) hypertension: Secondary | ICD-10-CM | POA: Diagnosis not present

## 2022-08-27 DIAGNOSIS — I2510 Atherosclerotic heart disease of native coronary artery without angina pectoris: Secondary | ICD-10-CM

## 2022-08-27 NOTE — Progress Notes (Signed)
Cardiology Office Note    Date:  08/27/2022  ID:  Alexander Nunez, Alexander Nunez [birth year], MRN [id number] Cardiologist: Charlton Haws, MD    History of Present Illness:    Alexander Nunez is a 77 y.o. male with past medical history of ILD, coronary calcification by CT (low-risk NST in 06/2016), HTN, HLD, OSA and anxiety who presents to the office today for annual follow-up.   He was last examined by Dr. Eden Emms in 07/2021 and reported baseline dyspnea on exertion but no chest pain or palpitations. Prior palpitations had improved with the use of beta-blocker therapy and he was continued on Lopressor 25mg  BID. He did have repeat PFT's in the interim which showed progressive ILD and biopsy was reviewed but he declined. He was started on CellCept and developed a rash with the medication and was switched to Mycorific in 05/2022 and had weight loss and fatigue with this.   In talking with the patient today, he reports his dyspnea has improved since being started on Imuran by Pulmonology. Was able to work outside in the yard and trimmed shrubs yesterday without significant symptoms. He denies any recent chest pain or palpitations. No specific orthopnea, PND or pitting edema. He does use his CPAP on a nightly basis.  Studies Reviewed:   EKG: EKG is ordered today and demonstrates:   EKG Interpretation Date/Time:  Wednesday August 27 2022 14:40:11 EDT Ventricular Rate:  75 PR Interval:  170 QRS Duration:  94 QT Interval:  384 QTC Calculation: 428 R Axis:   47  Text Interpretation: Normal sinus rhythm Incomplete right bundle branch block When compared with ECG of 25-Mar-2021 18:48, No significant change was found Confirmed by Randall An ([id number]) on 08/27/2022 2:42:41 PM        NST: 06/2016 Blood pressure demonstrated a normal response to exercise. The study is normal. No myocardial ischemia or scar. This is a low risk study. Nuclear stress EF: 55%.  Echocardiogram: 03/2021 IMPRESSIONS     1.  Left ventricular ejection fraction, by estimation, is 60 to 65%. The  left ventricle has normal function. The left ventricle has no regional  wall motion abnormalities. There is mild left ventricular hypertrophy.  Left ventricular diastolic parameters  are consistent with Grade I diastolic dysfunction (impaired relaxation).   2. Right ventricular systolic function is normal. The right ventricular  size is normal.   3. The mitral valve was not well visualized. No evidence of mitral valve  regurgitation.   4. The aortic valve was not well visualized. Aortic valve regurgitation  is not visualized.   Comparison(s): Changes from prior study are noted. 08/11/2019: LVEF 55-60%.    Cardiac Monitor: 05/2021 NSR Sinus bradycardia HR mostly in mid 40's at night  No high grade AV block One episode of Idioventricular rhythm 4 beats No correlation with symptoms    Physical Exam:   VS:  BP 136/74   Pulse 70   Ht 5\' 11"  (1.803 m)   Wt 263 lb (119.3 kg)   SpO2 97%   BMI 36.68 kg/m    Wt Readings from Last 3 Encounters:  08/27/22 263 lb (119.3 kg)  08/13/22 272 lb (123.4 kg)  06/24/22 270 lb 8.1 oz (122.7 kg)     GEN: Pleasant male appearing in no acute distress NECK: No JVD; No carotid bruits CARDIAC: RRR, no murmurs, rubs, gallops RESPIRATORY:  Clear to auscultation without rales, wheezing or rhonchi  ABDOMEN: Appears non-distended. No obvious abdominal masses. EXTREMITIES: No clubbing or cyanosis.  No pitting edema.  Distal pedal pulses are 2+ bilaterally.   Assessment and Plan:   1. Palpitations - He reports symptoms have overall been well-controlled. Continue Lopressor 25 mg twice daily. Would not further titrate given prior episodes of nocturnal bradycardia by monitor in 2023.  2. HTN - BP was initially recorded at 140/82, rechecked and improved to 136/74. He reports this has been well-controlled when checked at home as well. - Continue current medical therapy with Cardura 4mg   daily, Losartan 25mg  daily and Lopressor 25mg  BID.   3. Coronary Calcification by CT/ HLD - He previously underwent ischemic evaluation with a low-risk NST in 2018. He continues to overall feel well and denies any chest pain. EKG today is without acute ST changes.  - FLP in 11/2021 showed his LDL was at 94. We reviewed options today and he prefers to focus on dietary changes initially and does have follow-up labs with his PCP scheduled in 3 months. If LDL remains above 70, would recommend titration of Pravastatin from 20 mg daily to 40 mg daily or switching to Crestor 20mg  daily with repeat FLP and LFT's in 2 months.   4. ILD/OSA - Followed by [facility] and remains on Imuran and Ofev for ILD. Reports good compliance with his CPAP at night.   Signed, Ellsworth Lennox, PA-C

## 2022-08-27 NOTE — Patient Instructions (Signed)
Medication Instructions:  Your physician recommends that you continue on your current medications as directed. Please refer to the Current Medication list given to you today.  *If you need a refill on your cardiac medications before your next appointment, please call your pharmacy*   Lab Work: NONE   If you have labs (blood work) drawn today and your tests are completely normal, you will receive your results only by: MyChart Message (if you have MyChart) OR A paper copy in the mail If you have any lab test that is abnormal or we need to change your treatment, we will call you to review the results.   Testing/Procedures: NONE    Follow-Up: At [facility], you and your health needs are our priority.  As part of our continuing mission to provide you with exceptional heart care, we have created designated Provider Care Teams.  These Care Teams include your primary Cardiologist (physician) and Advanced Practice Providers (APPs -  Physician Assistants and Nurse Practitioners) who all work together to provide you with the care you need, when you need it.  We recommend signing up for the patient portal called "MyChart".  Sign up information is provided on this After Visit Summary.  MyChart is used to connect with patients for Virtual Visits (Telemedicine).  Patients are able to view lab/test results, encounter notes, upcoming appointments, etc.  Non-urgent messages can be sent to your provider as well.   To learn more about what you can do with MyChart, go to [email or web address].    Your next appointment:   1 year(s)  Provider:   You may see Peter Nishan, MD or one of the following Advanced Practice Providers on your designated Care Team:   Brittany Strader, PA-C  Michele Lenze, PA-C     Other Instructions Thank you for choosing [facility]!    

## 2022-09-05 ENCOUNTER — Other Ambulatory Visit: Payer: Self-pay | Admitting: Pulmonary Disease

## 2022-09-11 ENCOUNTER — Ambulatory Visit
Admission: EM | Admit: 2022-09-11 | Discharge: 2022-09-11 | Disposition: A | Discharge status: Home / Self Care | Payer: Medicare HMO | Attending: Nurse Practitioner | Admitting: Nurse Practitioner

## 2022-09-11 ENCOUNTER — Encounter: Payer: Self-pay | Admitting: Emergency Medicine

## 2022-09-11 DIAGNOSIS — B349 Viral infection, unspecified: Secondary | ICD-10-CM

## 2022-09-11 LAB — POCT INFLUENZA A/B
Influenza A, POC: NEGATIVE
Influenza B, POC: NEGATIVE

## 2022-09-11 MED ORDER — FLUTICASONE PROPIONATE 50 MCG/ACT NA SUSP: 2.0000 | Freq: Every day | NASAL | 0 refills | Status: AC

## 2022-09-11 MED ORDER — PROMETHAZINE-DM 6.25-15 MG/5ML PO SYRP: 5.0000 mL | ORAL_SOLUTION | Freq: Every evening | ORAL | 0 refills | Status: DC | PRN

## 2022-09-11 NOTE — ED Provider Notes (Signed)
[facility]    CSN: [id number] Arrival date & time: 09/11/22  1829      History   Chief Complaint No chief complaint on file.   HPI Alexander Nunez is a 77 y.o. male.   The history is provided by the patient.   The patient presents for complaints of fatigue, chills, body aches, head congestion, and chest congestion.  Symptoms started approximately 24 hours ago.  Patient denies headache, sore throat, runny nose, cough, chest pain, abdominal pain, nausea, vomiting, or diarrhea.  Patient denies any obvious known sick contacts.  Patient reports that he took a home COVID test which was negative last evening.  Patient reports he has been taking Tylenol and Mucinex for his symptoms.  Past Medical History:  Diagnosis Date   Anxiety    Arthritis    back   Back pain, chronic    Colon polyp    Diverticulitis    GERD (gastroesophageal reflux disease)    Heart palpitations    High cholesterol    Hypertension    Irritable bowel syndrome    Osteoporosis    Psoriasis    Pulmonary fibrosis (HCC)    Sleep apnea    c- pap    Patient Active Problem List   Diagnosis Date Noted   Lower respiratory tract infection 05/10/2021   Acute rhinosinusitis 05/10/2021   Thyroid mass 04/05/2021   Palpitations 04/05/2021   CAP (community acquired pneumonia) 03/26/2021   Chest pain 03/25/2021   ILD (interstitial lung disease) (HCC) 05/18/2019   Shortness of breath 05/18/2019   Allergic rhinitis 05/18/2019   Atypical chest pain 05/04/2014   Sleep apnea 05/04/2014   Abdominal bloating 07/22/2013   Spermatocele 05/02/2013   Chronic prostatitis 01/31/2013   Pain in testicle 01/31/2013   Weakness of left leg 08/12/2012   Generalized abdominal pain 09/02/2011   Lumbar canal stenosis 05/28/2011   Degenerative arthritis of lumbar spine 05/28/2011   SPL (spondylolisthesis) 05/28/2011   PERSONAL HX COLONIC POLYPS 04/19/2009   IRRITABLE BOWEL SYNDROME 04/06/2008   ABDOMINAL BLOATING  03/07/2008   CHANGE IN BOWELS 03/07/2008   ABDOMINAL PAIN-MULTIPLE SITES 03/07/2008   Essential hypertension 03/06/2008   GERD 03/06/2008   GASTRITIS 03/06/2008   CHRONIC RESPIRATORY DISEASE ARISE PERINTL PERIOD 03/06/2008   Hyperlipidemia 02/06/2003    Past Surgical History:  Procedure Laterality Date   BACK SURGERY  2015   CHOLECYSTECTOMY N/A 10/12/2015   Procedure: LAPAROSCOPIC CHOLECYSTECTOMY;  Surgeon: Franky Macho, MD;  Location: [facility];  Service: General;  Laterality: N/A;   COLONOSCOPY     FOOT FRACTURE SURGERY     left foot   KNEE ARTHROSCOPY     left   TONSILLECTOMY     UPPER GASTROINTESTINAL ENDOSCOPY         Home Medications    Prior to Admission medications   Medication Sig Start Date End Date Taking? Authorizing Provider  fluticasone (FLONASE) 50 MCG/ACT nasal spray Place 2 sprays into both nostrils daily. 09/11/22  Yes -Warren, Sadie Haber, NP  promethazine-dextromethorphan (PROMETHAZINE-DM) 6.25-15 MG/5ML syrup Take 5 mLs by mouth at bedtime as needed. 09/11/22  Yes -Warren, Sadie Haber, NP  albuterol (VENTOLIN HFA) 108 (90 Base) MCG/ACT inhaler Inhale 1-2 puffs into the lungs every 4 (four) hours as needed for shortness of breath or wheezing. 11/14/21   Lupita Leash, MD  alclomethasone (ACLOVATE) 0.05 % cream Apply 1 Application topically 2 (two) times daily. 05/20/22   [provider]  ALPRAZolam Prudy Feeler) 0.5 MG  tablet Take 0.25-0.5 mg by mouth 3 (three) times daily as needed for sleep.    [provider]  azaTHIOprine (IMURAN) 50 MG tablet TAKE 1 TABLET BY MOUTH EVERY DAY 09/05/22   [name], Praveen, MD  Azelastine HCl 137 MCG/SPRAY SOLN PLACE 2 SPRAYS INTO BOTH NOSTRILS 2 TIMES DAILY AS NEEDED FOR RHINITIS. 02/04/22   Allison Quarry, Ruby Cola, NP  doxazosin (CARDURA) 4 MG tablet Take 4 mg by mouth at bedtime.      [provider]  gabapentin (NEURONTIN) 100 MG capsule Take 1 capsule (100 mg total) by mouth 3 (three) times daily. Patient  not taking: Reported on 08/27/2022 08/09/22   -Warren, Sadie Haber, NP  losartan (COZAAR) 25 MG tablet Take 25 mg by mouth daily. 08/28/21   [provider]  metoprolol tartrate (LOPRESSOR) 25 MG tablet Take 25 mg by mouth 2 (two) times daily.    [provider]  Nintedanib (OFEV) 150 MG CAPS Take 1 capsule (150 mg total) by mouth 2 (two) times daily. 01/28/22   [name], Colbert Coyer, MD  pantoprazole (PROTONIX) 40 MG tablet Take 40 mg by mouth daily.    [provider]  pravastatin (PRAVACHOL) 20 MG tablet Take 20 mg by mouth every evening.     [provider]    Family History Family History  Problem Relation Age of Onset   Arrhythmia Mother        Atrial fib   Arrhythmia Sister        Atrial fib   Stroke Sister    Breast cancer Sister    Bladder Cancer Father            Colon cancer Neg Hx    Stomach cancer Neg Hx    Esophageal cancer Neg Hx    Pancreatic cancer Neg Hx    Prostate cancer Neg Hx    Rectal cancer Neg Hx     Social History Social History   Tobacco Use   Smoking status: Former    Current packs/day: 0.00    Average packs/day: 2.0 packs/day for 11.0 years (22.0 ttl pk-yrs)    Types: Cigarettes    Start date: 05/22/1962    Quit date: 05/21/1973    Years since quitting: 49.3   Smokeless tobacco: Never   Tobacco comments:    Quit 25 yrs now   Vaping Use   Vaping status: Never Used  Substance Use Topics   Alcohol use: Yes    Alcohol/week: 14.0 standard drinks of alcohol    Types: 14 Shots of liquor per week    Comment: social   Drug use: No     Allergies   Cephalexin and Contrast media [iodinated contrast media]   Review of Systems Review of Systems Per HPI  Physical Exam Triage Vital Signs ED Triage Vitals  Encounter Vitals Group     BP 09/11/22 1832 127/72     Systolic BP Percentile --      Diastolic BP Percentile --      Pulse Rate 09/11/22 1832 74     Resp 09/11/22 1832 18     Temp 09/11/22 1832 98.4 F  (36.9 C)     Temp Source 09/11/22 1832 Oral     SpO2 09/11/22 1832 92 %     Weight --      Height --      Head Circumference --      Peak Flow --      Pain Score 09/11/22 1834 5  Pain Loc --      Pain Education --      Exclude from Growth Chart --    No data found.  Updated Vital Signs BP 127/72 (BP Location: Right Arm)   Pulse 74   Temp 98.4 F (36.9 C) (Oral)   Resp 18   SpO2 92%   Visual Acuity Right Eye Distance:   Left Eye Distance:   Bilateral Distance:    Right Eye Near:   Left Eye Near:    Bilateral Near:     Physical Exam Vitals and nursing note reviewed.  Constitutional:      General: He is not in acute distress.    Appearance: Normal appearance.  HENT:     Head: Normocephalic.     Right Ear: Tympanic membrane, ear canal and external ear normal.     Left Ear: Tympanic membrane, ear canal and external ear normal.     Nose: Congestion present.     Mouth/Throat:     Mouth: Mucous membranes are moist.     Pharynx: Posterior oropharyngeal erythema present.     Comments: Cobblestoning present to posterior oropharynx Eyes:     Extraocular Movements: Extraocular movements intact.     Conjunctiva/sclera: Conjunctivae normal.     Pupils: Pupils are equal, round, and reactive to light.  Cardiovascular:     Rate and Rhythm: Normal rate and regular rhythm.     Pulses: Normal pulses.     Heart sounds: Normal heart sounds.  Pulmonary:     Effort: Pulmonary effort is normal. No respiratory distress.     Breath sounds: Normal breath sounds. No stridor. No wheezing, rhonchi or rales.  Abdominal:     General: Bowel sounds are normal.     Palpations: Abdomen is soft.     Tenderness: There is no abdominal tenderness.  Musculoskeletal:     Cervical back: Normal range of motion.  Lymphadenopathy:     Cervical: No cervical adenopathy.  Skin:    General: Skin is warm and dry.  Neurological:     General: No focal deficit present.     Mental Status: He is alert  and oriented to person, place, and time.  Psychiatric:        Mood and Affect: Mood normal.        Behavior: Behavior normal.      UC Treatments / Results  Labs (all labs ordered are listed, but only abnormal results are displayed) Labs Reviewed  POCT INFLUENZA A/B    EKG   Radiology No results found.  Procedures Procedures (including critical care time)  Medications Ordered in UC Medications - No data to display  Initial Impression / Assessment and Plan / UC Course  I have reviewed the triage vital signs and the nursing notes.  Pertinent labs & imaging results that were available during my care of the patient were reviewed by me and considered in my medical decision making (see chart for details).  The patient is well-appearing, he is in no acute distress, vital signs are stable.  Influenza test was negative.  Patient with a negative home COVID test.  Symptoms appear to be of viral etiology.  Will provide symptomatic treatment to include fluticasone 50 micro nasal spray, and Promethazine DM for cough for bedtime.  Supportive care recommendations were provided and discussed with the patient to include increasing fluids, allowing for plenty of rest, over-the-counter Tylenol for pain or discomfort, and continuing use of Mucinex for his cough during the daytime.  Patient was given follow-up precautions.  Patient is in agreement with this plan of care and verbalizes understanding.  All questions were answered.  Patient stable for discharge.  Final Clinical Impressions(s) / UC Diagnoses   Final diagnoses:  Viral illness     Discharge Instructions      The influenza test was negative. Take medication as prescribed. Increase fluids and allow for plenty of rest. Continue over-the-counter Tylenol as needed for pain, fever, or general discomfort. You can continue the Mucinex to help with your cough during the daytime.   Recommend using a humidifier in your bedroom at  nighttime during sleep and sleeping elevated on pillows while cough symptoms persist. Recommend normal saline nasal spray throughout the day to help with nasal congestion and runny nose. Make sure you are not eating.  If you have decreased appetite, recommend chicken broth, chicken noodle soup, fruit, and vegetables. This appears to be a viral upper respiratory infection.  As discussed, if symptoms are not improving over the next 7 to 10 days, or if they are suddenly worsening, please follow-up in this clinic or with your primary care physician for further evaluation. Follow-up as needed.     ED Prescriptions     Medication Sig Dispense Auth. Provider   fluticasone (FLONASE) 50 MCG/ACT nasal spray Place 2 sprays into both nostrils daily. 16 g -Warren, Sadie Haber, NP   promethazine-dextromethorphan (PROMETHAZINE-DM) 6.25-15 MG/5ML syrup Take 5 mLs by mouth at bedtime as needed. 118 mL -Warren, Sadie Haber, NP      PDMP not reviewed this encounter.   Abran Cantor, NP 09/11/22 1910

## 2022-09-11 NOTE — ED Triage Notes (Signed)
Head and chest congestion since yesterday.  Also c/o body aches.  Home covid test last night was negative.

## 2022-09-11 NOTE — Discharge Instructions (Addendum)
The influenza test was negative. Take medication as prescribed. Increase fluids and allow for plenty of rest. Continue over-the-counter Tylenol as needed for pain, fever, or general discomfort. You can continue the Mucinex to help with your cough during the daytime.   Recommend using a humidifier in your bedroom at nighttime during sleep and sleeping elevated on pillows while cough symptoms persist. Recommend normal saline nasal spray throughout the day to help with nasal congestion and runny nose. Make sure you are not eating.  If you have decreased appetite, recommend chicken broth, chicken noodle soup, fruit, and vegetables. This appears to be a viral upper respiratory infection.  As discussed, if symptoms are not improving over the next 7 to 10 days, or if they are suddenly worsening, please follow-up in this clinic or with your primary care physician for further evaluation. Follow-up as needed.

## 2022-09-14 DIAGNOSIS — I10 Essential (primary) hypertension: Secondary | ICD-10-CM | POA: Diagnosis not present

## 2022-09-14 DIAGNOSIS — G4733 Obstructive sleep apnea (adult) (pediatric): Secondary | ICD-10-CM | POA: Diagnosis not present

## 2022-10-02 ENCOUNTER — Telehealth: Payer: Self-pay | Admitting: Pulmonary Disease

## 2022-10-07 NOTE — Telephone Encounter (Signed)
Moved patient's ct to 11/27/22 at [facility] they wanted it his ct before 10/10 but patients follow up is actually  on 10/25 looks like they had already got the appt resc will call patient and make him aware.

## 2022-10-08 NOTE — Telephone Encounter (Signed)
Left message for the patient

## 2022-10-15 DIAGNOSIS — G4733 Obstructive sleep apnea (adult) (pediatric): Secondary | ICD-10-CM | POA: Diagnosis not present

## 2022-10-15 DIAGNOSIS — I10 Essential (primary) hypertension: Secondary | ICD-10-CM | POA: Diagnosis not present

## 2022-10-20 DIAGNOSIS — I2584 Coronary atherosclerosis due to calcified coronary lesion: Secondary | ICD-10-CM | POA: Diagnosis not present

## 2022-10-20 DIAGNOSIS — I10 Essential (primary) hypertension: Secondary | ICD-10-CM | POA: Diagnosis not present

## 2022-10-20 DIAGNOSIS — N411 Chronic prostatitis: Secondary | ICD-10-CM | POA: Diagnosis not present

## 2022-10-20 DIAGNOSIS — I700 Atherosclerosis of aorta: Secondary | ICD-10-CM | POA: Diagnosis not present

## 2022-10-20 DIAGNOSIS — K589 Irritable bowel syndrome without diarrhea: Secondary | ICD-10-CM | POA: Diagnosis not present

## 2022-10-20 DIAGNOSIS — E042 Nontoxic multinodular goiter: Secondary | ICD-10-CM | POA: Diagnosis not present

## 2022-10-20 DIAGNOSIS — G4733 Obstructive sleep apnea (adult) (pediatric): Secondary | ICD-10-CM | POA: Diagnosis not present

## 2022-10-20 DIAGNOSIS — R002 Palpitations: Secondary | ICD-10-CM | POA: Diagnosis not present

## 2022-10-20 DIAGNOSIS — L409 Psoriasis, unspecified: Secondary | ICD-10-CM | POA: Diagnosis not present

## 2022-10-20 DIAGNOSIS — J849 Interstitial pulmonary disease, unspecified: Secondary | ICD-10-CM | POA: Diagnosis not present

## 2022-10-20 DIAGNOSIS — K219 Gastro-esophageal reflux disease without esophagitis: Secondary | ICD-10-CM | POA: Diagnosis not present

## 2022-10-20 DIAGNOSIS — F419 Anxiety disorder, unspecified: Secondary | ICD-10-CM | POA: Diagnosis not present

## 2022-10-24 ENCOUNTER — Telehealth: Payer: Self-pay | Admitting: Pulmonary Disease

## 2022-10-24 MED ORDER — PREDNISONE 10 MG PO TABS: 40.0000 mg | ORAL_TABLET | Freq: Every day | ORAL | 0 refills | Status: AC

## 2022-10-24 MED ORDER — ZITHROMAX Z-PAK 250 MG PO TABS: ORAL_TABLET | ORAL | 0 refills | Status: DC

## 2022-10-24 NOTE — Telephone Encounter (Signed)
Called and spoke to patient.  He stated that he developed URI 2 weeks ago. Was sent at [facility] and neg for covid at time. He was prescribed cough syrup at that time. He feels that sx have worsen. C/o prod cough with clear to yellow sputum, increased SOB and chest/head congestion x2w He does not have supplemental oxygen. SPO2 dropped as low as 87% yesterday. Recovered to 92% after using albuterol HFA. He used albuterol TID yesterday.     Beth, please advise. Thanks Dr. Isaiah Serge is unavailable.

## 2022-10-24 NOTE — Telephone Encounter (Signed)
Patient is having some upper respiratory issues and would like for something to be called in. Call back number [phone]

## 2022-10-24 NOTE — Telephone Encounter (Signed)
Called and spoke with patient. He is having upper resp infection with sinus and chest congestion. Negative for Covid and flu at urgent care. He was give a cough medication but symptoms persist  Will call in z pack and prednisone 40mg /day for 5 days. He will call back if symptoms worsen. Nothing further needed.

## 2022-11-13 ENCOUNTER — Ambulatory Visit: Payer: Medicare HMO | Admitting: Pulmonary Disease

## 2022-11-13 DIAGNOSIS — J849 Interstitial pulmonary disease, unspecified: Secondary | ICD-10-CM

## 2022-11-13 LAB — PULMONARY FUNCTION TEST
DL/VA % pred: 89 %
DL/VA: 3.51 ml/min/mmHg/L
DLCO cor % pred: 44 %
DLCO cor: 11.54 ml/min/mmHg
DLCO unc % pred: 44 %
DLCO unc: 11.54 ml/min/mmHg
FEF 25-75 Post: 4.05 L/s
FEF 25-75 Pre: 3.5 L/s
FEF2575-%Change-Post: 15 %
FEF2575-%Pred-Post: 178 %
FEF2575-%Pred-Pre: 154 %
FEV1-%Change-Post: 1 %
FEV1-%Pred-Post: 63 %
FEV1-%Pred-Pre: 62 %
FEV1-Post: 2 L
FEV1-Pre: 1.97 L
FEV1FVC-%Change-Post: 0 %
FEV1FVC-%Pred-Pre: 124 %
FEV6-%Change-Post: 2 %
FEV6-%Pred-Post: 54 %
FEV6-%Pred-Pre: 52 %
FEV6-Post: 2.22 L
FEV6-Pre: 2.17 L
FEV6FVC-%Change-Post: 0 %
FEV6FVC-%Pred-Post: 106 %
FEV6FVC-%Pred-Pre: 105 %
FVC-%Change-Post: 1 %
FVC-%Pred-Post: 50 %
FVC-%Pred-Pre: 50 %
FVC-Post: 2.22 L
FVC-Pre: 2.19 L
Post FEV1/FVC ratio: 90 %
Post FEV6/FVC ratio: 100 %
Pre FEV1/FVC ratio: 90 %
Pre FEV6/FVC Ratio: 99 %
RV % pred: 47 %
RV: 1.24 L
TLC % pred: 54 %
TLC: 3.92 L

## 2022-11-13 NOTE — Progress Notes (Signed)
Full PFT performed today. °

## 2022-11-13 NOTE — Patient Instructions (Signed)
Full PFT performed today. °

## 2022-11-21 DIAGNOSIS — I10 Essential (primary) hypertension: Secondary | ICD-10-CM | POA: Diagnosis not present

## 2022-11-21 DIAGNOSIS — G4733 Obstructive sleep apnea (adult) (pediatric): Secondary | ICD-10-CM | POA: Diagnosis not present

## 2022-11-27 ENCOUNTER — Ambulatory Visit (HOSPITAL_COMMUNITY)
Admission: RE | Admit: 2022-11-27 | Discharge: 2022-11-27 | Disposition: A | Discharge status: Home / Self Care | Payer: Medicare HMO | Source: Ambulatory Visit | Attending: Pulmonary Disease | Admitting: Pulmonary Disease

## 2022-11-27 ENCOUNTER — Other Ambulatory Visit: Payer: Medicare HMO

## 2022-11-27 DIAGNOSIS — J849 Interstitial pulmonary disease, unspecified: Secondary | ICD-10-CM | POA: Insufficient documentation

## 2022-11-27 DIAGNOSIS — I700 Atherosclerosis of aorta: Secondary | ICD-10-CM | POA: Diagnosis not present

## 2022-11-27 DIAGNOSIS — J679 Hypersensitivity pneumonitis due to unspecified organic dust: Secondary | ICD-10-CM | POA: Diagnosis not present

## 2022-11-27 DIAGNOSIS — J479 Bronchiectasis, uncomplicated: Secondary | ICD-10-CM | POA: Diagnosis not present

## 2022-11-27 DIAGNOSIS — R590 Localized enlarged lymph nodes: Secondary | ICD-10-CM | POA: Diagnosis not present

## 2022-11-28 ENCOUNTER — Ambulatory Visit: Payer: Medicare HMO | Admitting: Pulmonary Disease

## 2022-11-28 ENCOUNTER — Encounter: Payer: Self-pay | Admitting: Pulmonary Disease

## 2022-11-28 VITALS — BP 140/80 | HR 60 | Ht 71.0 in | Wt 258.6 lb

## 2022-11-28 DIAGNOSIS — J849 Interstitial pulmonary disease, unspecified: Secondary | ICD-10-CM

## 2022-11-28 DIAGNOSIS — Z5181 Encounter for therapeutic drug level monitoring: Secondary | ICD-10-CM | POA: Diagnosis not present

## 2022-11-28 MED ORDER — AZATHIOPRINE 50 MG PO TABS: 100.0000 mg | ORAL_TABLET | Freq: Every day | ORAL | 3 refills | Status: DC

## 2022-11-28 NOTE — Progress Notes (Signed)
Alexander Nunez    [id number]    1945/09/09  Primary Care Physician:Fusco, Lyman Bishop, MD  Referring Physician: Elfredia Nevins, MD [address] [place],  [place] [address]  Chief complaint: Follow-up for ILD, pulmonary fibrosis  HPI: 77 y.o. with history of hypertension, hyperlipidemia, sleep apnea, psoriasis  2020 Had an incidental finding of interstitial lung disease on CT abdomen which was done for diverticulitis He has seen Dr. Shaune Pollack at [facility] who did a high resolution CT with findings of alternate diagnosis, possible HP.  He had HP panel on 12/28/2018 which is negative. History notable for psoriasis with skin involvement.  He was on methotrexate for a couple of years and stopped as his skin rash improved.  Last dose of methotrexate was several years ago.  2021 Had COVID-19 infection December 2021, referred for monoclonal antibody  2023 Discussed at multidisciplinary conference on 10/15/2021 CTs show progressive interstitial lung disease and alternate pattern. Differential diagnoses include chronic HP versus NSIP.  Recommend a biopsy and would favor surgical lung biopsy as transbronchial and BAL may not give a definitive diagnosis. MDC see also recommends empiric treatment with antifibrotic's +/- anti-inflammatory if pathology is suggestive.  Started Ofev in first week of Jan 2023.  He is tolerating it well so far with no issue Has started pulmonary rehab and is doing well States that breathing is better.  Started on CellCept in February 2024.  He developed rash on his skin immediately after.  He was treated with antifungal cream and discontinuation of CellCept with improvement in symptoms.  The symptoms recurred after he rechallenge himself with CellCept and is now completely off the medication. Started my Mycorific in April 2024 this was also poorly tolerated with weight loss and fatigue and he is off this medication as well  Pets: Has a dog.  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  His hobbies include gardening but no significant exposure to mold,No hot tub, Jacuzzi, no down pillows or comforters.  He uses talcum powder every day.  May have had Freon leak from his HVAC system. Likes to play golf.   Smoking history: 30-pack-year smoker.  Quit in 1980 Travel history: No significant travel history Relevant family history: No significant family history of lung disease.  Interval history: Discussed the use of AI scribe software for clinical note transcription with the patient, who gave verbal consent to proceed.  The patient, with a history of interstitial lung disease, reports stable breathing with occasional drops in oxygen saturation to 87% during strenuous activity.  Azathioprine at 50 mg has been started on July 2024.  He has been taking azathioprine and nintedanib (Ofev) without any side effects. He also uses a CPAP machine for sleep apnea, which appears to be well controlled based on the machine's data. However, he has not had an overnight oximetry test to assess oxygen levels during sleep. He also reports a recent bout of bronchitis with significant mucus production.  The patient's recent CT scan showed minimal changes, suggesting stable lung disease. However, his lung function test showed a small decline, which may be due to the recent bronchitis or an unreliable test result. The patient recalls difficulty performing the test and experiencing a headache afterwards.  The patient also mentions a desire to occasionally consume alcohol and inquires about  potential interactions with his medications.   Outpatient Encounter Medications as of 11/28/2022  Medication Sig   albuterol (VENTOLIN HFA) 108 (90 Base) MCG/ACT inhaler Inhale 1-2 puffs into the lungs  every 4 (four) hours as needed for shortness of breath or wheezing.   alclomethasone (ACLOVATE) 0.05 % cream Apply 1 Application topically 2 (two) times daily.   ALPRAZolam (XANAX) 0.5 MG tablet Take 0.25-0.5 mg by mouth 3 (three) times daily as needed for sleep.   azaTHIOprine (IMURAN) 50 MG tablet TAKE 1 TABLET BY MOUTH EVERY DAY   Azelastine HCl 137 MCG/SPRAY SOLN PLACE 2 SPRAYS INTO BOTH NOSTRILS 2 TIMES DAILY AS NEEDED FOR RHINITIS.   doxazosin (CARDURA) 4 MG tablet Take 4 mg by mouth at bedtime.     fluticasone (FLONASE) 50 MCG/ACT nasal spray Place 2 sprays into both nostrils daily.   losartan (COZAAR) 25 MG tablet Take 25 mg by mouth daily.   metoprolol tartrate (LOPRESSOR) 25 MG tablet Take 25 mg by mouth 2 (two) times daily.   Nintedanib (OFEV) 150 MG CAPS Take 1 capsule (150 mg total) by mouth 2 (two) times daily.   pantoprazole (PROTONIX) 40 MG tablet Take 40 mg by mouth daily.   pravastatin (PRAVACHOL) 20 MG tablet Take 20 mg by mouth every evening.    [DISCONTINUED] gabapentin (NEURONTIN) 100 MG capsule Take 1 capsule (100 mg total) by mouth 3 (three) times daily. (Patient not taking: Reported on 08/27/2022)   [DISCONTINUED] promethazine-dextromethorphan (PROMETHAZINE-DM) 6.25-15 MG/5ML syrup Take 5 mLs by mouth at bedtime as needed.   [DISCONTINUED] ZITHROMAX Z-PAK 250 MG tablet Take 500 mg on day 1, followed by 250 mg once daily on days 2 to 5   No facility-administered encounter medications on file as of 11/28/2022.   Physical Exam: Blood pressure (!) 140/80, pulse 60, height 5\' 11"  (1.803 m), weight 258 lb 9.6 oz (117.3 kg), SpO2 97%. Gen:      No acute distress HEENT:  EOMI, sclera anicteric Neck:     No masses; no thyromegaly Lungs:    Clear to auscultation bilaterally; normal respiratory effort CV:         Regular rate and rhythm; no murmurs Abd:      + bowel sounds; soft, non-tender; no palpable masses, no distension Ext:    No edema; adequate peripheral  perfusion Skin:      Warm and dry; no rash Neuro: alert and oriented x 3 Psych: normal mood and affect   Data Reviewed: Imaging: High-resolution CT 12/24/2018-mild emphysema, traction bronchiectasis, patchy groundglass and reticulation.  More prominent in the upper lobes with no basal gradient.  Air-trapping present. High-res CT 09/19/2019-stable to minimally changed interstitial lung disease in alternate pattern  CT 03/25/2021-interstitial lung disease with superimposed groundglass opacities in the right upper and left lower lobe.  High-resolution CT 09/12/2021-upper lobe predominant fibrotic interstitial lung disease and alternate pattern.  High resolution CT 11/28/2022-stable/minimally progressive findings of interstitial lung disease in alternate pattern. I have reviewed the images personally.  PFTs: 01/06/2019 FVC 3 [66%], FEV1 2.61 [78%], F/F 87, TLC 4.33 [59%], DLCO 22.56 [85%] Severe restriction with no obstruction.  Diffusion capacity is normal  10/03/2019 FVC 2.90 [64%], FEV1 2.47 [94%], DLCO 21.35 [81%] Moderate restriction  08/29/2020 FVC 2.14 [1%], FEV1 2.42 [74%], TLC 4.55 [62%], DLCO 19.75 [75%] Moderate restriction, mild diffusion defect  01/08/2022 FVC 2.29 [52%], FEV1 2.05 [64%], F/F89, TLC 3.70 [51%], DLCO 14.84 [57%]  11/13/2022 FVC 2.22 [50%], FEV1 2.00 [62%], F/F90, TLC 3.92 [54%], DLCO  11.54 [44%] Moderate restriction, moderate diffusion defect  Labs: Hypersensitivity panel 12/28/2018- Negative CTD profile 02/10/2019- Negative  Sleep: Home sleep study 08/03/2021 Moderate OSA with AHI 28.6, low O2 sat of 81%.  Assessment:  Interstitial lung disease CT scan reviewed with alternate pattern of pulmonary fibrosis with upper lobe predominance.  This could be hypersensitivity pneumonitis though he does not have any significant exposures  Lab work including CTD serologies, hypersensitivity panel is negative.  Has remote exposure to asbestos but CT scan is not  typical of asbestosis.  His CT may show slight progression.  We had multiple discussions including today for further work-up in detail with patient, including wait and watch with monitoring, bronchoscope with BAL, transbronchial biopsies or surgical lung biopsy.  He is very reluctant to undergo an invasive procedure. Per multidisciplinary conference recommendations for biopsy, treatment with antifibrotic's +/- anti-inflammatory.  After long discussion with patient and family we have decided to start treatment with antifibrotic's.   He is tolerating Ofev well so far.  CellCept tried earlier this year but developed a rash. Mycorific was poorly tolerated as well. Increase azathioprine dose to 100 mg/day.  Check labs for monitoring Continue pulmonary rehab  CT reviewed with minimal changes.  PFT showed reduced diffusion capacity but the test is unreliable as it was nonreproducible.  OSA He has diagnosis of OSA 30 years ago.  On auto CPAP. CPAP download reviewed with good compliance and response with residual AHI of 2.2 Check night oximetry on CPAP to see if he needs supplemental oxygen  Obesity He has weight gain recently which is suspect is contributing to his restriction and dyspnea Advised weight loss with diet and exercise  Plan/Recommendations: - Check  labs for monitoring - Continue Ofev.  Increase azathioprine to 100 mg. - Pulmonary rehab. - Overnight oximetry on CPAP - Follow-up in 3 months  Chilton Greathouse MD [facility] 11/28/2022, 2:49 PM  CC: Elfredia Nevins, MD

## 2022-11-28 NOTE — Patient Instructions (Signed)
VISIT SUMMARY:  During today's visit, we discussed your lung disease, sleep apnea, and recent bronchitis. Your breathing is stable, and your medications are working well without side effects. We also reviewed your recent CT scan and lung function test results. Additionally, we talked about your use of a CPAP machine and the need for an overnight oximetry test.  YOUR PLAN:  -INTERSTITIAL LUNG DISEASE: Interstitial lung disease refers to a group of disorders that cause scarring of the lungs, making it difficult to breathe. Your condition is stable, but we will increase your Azathioprine dose to 100mg  daily and continue your current dose of Nintedanib. We will also monitor your liver function and blood counts with labs today.  -SLEEP APNEA: Sleep apnea is a condition where breathing repeatedly stops and starts during sleep. You are using your CPAP machine regularly, which is controlling your sleep apnea well. We will order an overnight oximetry test to check if you need oxygen at night. If you don't hear back from the company within a month, please contact our office.  INSTRUCTIONS:  Please follow up in 3 months to assess your response to the increased Azathioprine dose. If you do not receive a response from the company regarding the overnight oximetry test within a month, contact our office.

## 2022-11-29 LAB — CBC WITH DIFFERENTIAL/PLATELET
Absolute Lymphocytes: 2225 {cells}/uL (ref 850–3900)
Absolute Monocytes: 1082 {cells}/uL — ABNORMAL HIGH (ref 200–950)
Basophils Absolute: 82 {cells}/uL (ref 0–200)
Basophils Relative: 0.8 %
Eosinophils Absolute: 371 {cells}/uL (ref 15–500)
Eosinophils Relative: 3.6 %
HCT: 41.5 % (ref 38.5–50.0)
Hemoglobin: 14.1 g/dL (ref 13.2–17.1)
MCH: 32.6 pg (ref 27.0–33.0)
MCHC: 34 g/dL (ref 32.0–36.0)
MCV: 95.8 fL (ref 80.0–100.0)
MPV: 9.9 fL (ref 7.5–12.5)
Monocytes Relative: 10.5 %
Neutro Abs: 6541 {cells}/uL (ref 1500–7800)
Neutrophils Relative %: 63.5 %
Platelets: 284 10*3/uL (ref 140–400)
RBC: 4.33 10*6/uL (ref 4.20–5.80)
RDW: 12.9 % (ref 11.0–15.0)
Total Lymphocyte: 21.6 %
WBC: 10.3 10*3/uL (ref 3.8–10.8)

## 2022-11-29 LAB — COMPREHENSIVE METABOLIC PANEL
AG Ratio: 1.3 (calc) (ref 1.0–2.5)
ALT: 13 U/L (ref 9–46)
AST: 18 U/L (ref 10–35)
Albumin: 4 g/dL (ref 3.6–5.1)
Alkaline phosphatase (APISO): 67 U/L (ref 35–144)
BUN: 16 mg/dL (ref 7–25)
CO2: 30 mmol/L (ref 20–32)
Calcium: 9.4 mg/dL (ref 8.6–10.3)
Chloride: 102 mmol/L (ref 98–110)
Creat: 1.28 mg/dL (ref 0.70–1.28)
Globulin: 3.2 g/dL (ref 1.9–3.7)
Glucose, Bld: 82 mg/dL (ref 65–99)
Potassium: 4.9 mmol/L (ref 3.5–5.3)
Sodium: 139 mmol/L (ref 135–146)
Total Bilirubin: 0.6 mg/dL (ref 0.2–1.2)
Total Protein: 7.2 g/dL (ref 6.1–8.1)

## 2022-12-01 ENCOUNTER — Telehealth: Payer: Self-pay | Admitting: Pharmacist

## 2022-12-01 NOTE — Telephone Encounter (Signed)
Patient brought in Ofev PAP renewal last year but his income for household of 2 will no longer qualify him based on PAP criteria. I spoke with patient regarding this and needing signature and income documents.  Advised him to call [employer] if he needs clarification on what they look at for income calculation  He has my number to call back if needed  Chesley Mires, PharmD, MPH, BCPS, CPP Clinical Pharmacist (Rheumatology and Pulmonology)

## 2022-12-16 ENCOUNTER — Other Ambulatory Visit: Payer: Self-pay | Admitting: Pharmacist

## 2022-12-16 ENCOUNTER — Other Ambulatory Visit: Payer: Self-pay

## 2022-12-16 DIAGNOSIS — J849 Interstitial pulmonary disease, unspecified: Secondary | ICD-10-CM

## 2022-12-16 MED ORDER — OFEV 150 MG PO CAPS: 150.0000 mg | ORAL_CAPSULE | Freq: Two times a day (BID) | ORAL | 1 refills | Status: DC

## 2022-12-16 NOTE — Telephone Encounter (Signed)
Returned call to patient about Ofev. He will collect income documents. He requested application for [employer] be left up front for him and he'll complete onsite. He has been unable to get in touch with [employer] > 3 hr hold time multiple times   Provider portion placed in Dr. Shirlee More mailbox for signature   Chesley Mires, PharmD, MPH, BCPS, CPP Clinical Pharmacist (Rheumatology and Pulmonology)

## 2022-12-16 NOTE — Progress Notes (Signed)
Returned call to patient about Ofev. He will collect income documents. He requested application for [employer] be left up front for him and he'll complete onsite  Provider portion placed in Dr. Shirlee More mailbox for signature  Chesley Mires, PharmD, MPH, BCPS, CPP Clinical Pharmacist (Rheumatology and Pulmonology)

## 2022-12-17 ENCOUNTER — Other Ambulatory Visit (HOSPITAL_COMMUNITY): Payer: Self-pay | Admitting: Internal Medicine

## 2022-12-17 ENCOUNTER — Ambulatory Visit (HOSPITAL_COMMUNITY)
Admission: RE | Admit: 2022-12-17 | Discharge: 2022-12-17 | Disposition: A | Discharge status: Home / Self Care | Payer: Medicare HMO | Source: Ambulatory Visit | Attending: Internal Medicine | Admitting: Internal Medicine

## 2022-12-17 DIAGNOSIS — I700 Atherosclerosis of aorta: Secondary | ICD-10-CM | POA: Diagnosis not present

## 2022-12-17 DIAGNOSIS — J449 Chronic obstructive pulmonary disease, unspecified: Secondary | ICD-10-CM | POA: Diagnosis not present

## 2022-12-17 DIAGNOSIS — E6609 Other obesity due to excess calories: Secondary | ICD-10-CM | POA: Diagnosis not present

## 2022-12-17 DIAGNOSIS — Z6837 Body mass index (BMI) 37.0-37.9, adult: Secondary | ICD-10-CM | POA: Diagnosis not present

## 2022-12-17 DIAGNOSIS — F419 Anxiety disorder, unspecified: Secondary | ICD-10-CM | POA: Diagnosis not present

## 2022-12-17 DIAGNOSIS — M1991 Primary osteoarthritis, unspecified site: Secondary | ICD-10-CM | POA: Diagnosis not present

## 2022-12-17 DIAGNOSIS — R1030 Lower abdominal pain, unspecified: Secondary | ICD-10-CM

## 2022-12-17 DIAGNOSIS — Z9049 Acquired absence of other specified parts of digestive tract: Secondary | ICD-10-CM | POA: Diagnosis not present

## 2022-12-17 DIAGNOSIS — K5730 Diverticulosis of large intestine without perforation or abscess without bleeding: Secondary | ICD-10-CM | POA: Diagnosis not present

## 2022-12-17 DIAGNOSIS — I10 Essential (primary) hypertension: Secondary | ICD-10-CM | POA: Diagnosis not present

## 2022-12-17 DIAGNOSIS — J849 Interstitial pulmonary disease, unspecified: Secondary | ICD-10-CM | POA: Diagnosis not present

## 2022-12-22 DIAGNOSIS — G4733 Obstructive sleep apnea (adult) (pediatric): Secondary | ICD-10-CM | POA: Diagnosis not present

## 2022-12-22 DIAGNOSIS — I10 Essential (primary) hypertension: Secondary | ICD-10-CM | POA: Diagnosis not present

## 2022-12-24 NOTE — Telephone Encounter (Signed)
Pt form signed and returned to pharmacy team. No income docs were attached to application, income section also left blank. Contacted pt to f/u, he initially stated that he "turned everything in" however stated that he did not turn in any physical income documents as he did not have them. He also stated that he completed the annual income section--inquired about what he put down, which he states it was the same amount as last year. Income amount obtained from 2024 re-enrollment form and advised pt that we will go ahead and submit what we have and that he should be aware that [employer] may be reaching out to collect income documents after submission.  Pt verbalized understanding to all. Pt portion placed in "awaiting response" folder.

## 2022-12-25 ENCOUNTER — Encounter (INDEPENDENT_AMBULATORY_CARE_PROVIDER_SITE_OTHER): Payer: Self-pay | Admitting: *Deleted

## 2022-12-25 DIAGNOSIS — J209 Acute bronchitis, unspecified: Secondary | ICD-10-CM | POA: Diagnosis not present

## 2022-12-25 DIAGNOSIS — J849 Interstitial pulmonary disease, unspecified: Secondary | ICD-10-CM | POA: Diagnosis not present

## 2022-12-25 DIAGNOSIS — I10 Essential (primary) hypertension: Secondary | ICD-10-CM | POA: Diagnosis not present

## 2022-12-25 DIAGNOSIS — Z6837 Body mass index (BMI) 37.0-37.9, adult: Secondary | ICD-10-CM | POA: Diagnosis not present

## 2022-12-25 DIAGNOSIS — J449 Chronic obstructive pulmonary disease, unspecified: Secondary | ICD-10-CM | POA: Diagnosis not present

## 2022-12-25 DIAGNOSIS — E6609 Other obesity due to excess calories: Secondary | ICD-10-CM | POA: Diagnosis not present

## 2022-12-28 ENCOUNTER — Emergency Department (HOSPITAL_COMMUNITY)
Admission: EM | Admit: 2022-12-28 | Discharge: 2022-12-28 | Disposition: A | Discharge status: Home / Self Care | Payer: Medicare HMO | Attending: Emergency Medicine | Admitting: Emergency Medicine

## 2022-12-28 ENCOUNTER — Emergency Department (HOSPITAL_COMMUNITY): Payer: Medicare HMO

## 2022-12-28 ENCOUNTER — Other Ambulatory Visit: Payer: Self-pay

## 2022-12-28 ENCOUNTER — Encounter (HOSPITAL_COMMUNITY): Payer: Self-pay

## 2022-12-28 DIAGNOSIS — R0602 Shortness of breath: Secondary | ICD-10-CM | POA: Diagnosis not present

## 2022-12-28 DIAGNOSIS — R918 Other nonspecific abnormal finding of lung field: Secondary | ICD-10-CM | POA: Diagnosis not present

## 2022-12-28 DIAGNOSIS — D72829 Elevated white blood cell count, unspecified: Secondary | ICD-10-CM | POA: Diagnosis not present

## 2022-12-28 DIAGNOSIS — D649 Anemia, unspecified: Secondary | ICD-10-CM | POA: Insufficient documentation

## 2022-12-28 DIAGNOSIS — Z1152 Encounter for screening for COVID-19: Secondary | ICD-10-CM | POA: Insufficient documentation

## 2022-12-28 DIAGNOSIS — J4521 Mild intermittent asthma with (acute) exacerbation: Secondary | ICD-10-CM | POA: Diagnosis not present

## 2022-12-28 DIAGNOSIS — R0989 Other specified symptoms and signs involving the circulatory and respiratory systems: Secondary | ICD-10-CM | POA: Diagnosis not present

## 2022-12-28 LAB — CBC WITH DIFFERENTIAL/PLATELET
Abs Immature Granulocytes: 0.06 10*3/uL (ref 0.00–0.07)
Basophils Absolute: 0.1 10*3/uL (ref 0.0–0.1)
Basophils Relative: 1 %
Eosinophils Absolute: 0.5 10*3/uL (ref 0.0–0.5)
Eosinophils Relative: 4 %
HCT: 38.7 % — ABNORMAL LOW (ref 39.0–52.0)
Hemoglobin: 12.7 g/dL — ABNORMAL LOW (ref 13.0–17.0)
Immature Granulocytes: 1 %
Lymphocytes Relative: 16 %
Lymphs Abs: 1.9 10*3/uL (ref 0.7–4.0)
MCH: 31.5 pg (ref 26.0–34.0)
MCHC: 32.8 g/dL (ref 30.0–36.0)
MCV: 96 fL (ref 80.0–100.0)
Monocytes Absolute: 1.6 10*3/uL — ABNORMAL HIGH (ref 0.1–1.0)
Monocytes Relative: 13 %
Neutro Abs: 8.2 10*3/uL — ABNORMAL HIGH (ref 1.7–7.7)
Neutrophils Relative %: 65 %
Platelets: 247 10*3/uL (ref 150–400)
RBC: 4.03 MIL/uL — ABNORMAL LOW (ref 4.22–5.81)
RDW: 14.3 % (ref 11.5–15.5)
WBC: 12.2 10*3/uL — ABNORMAL HIGH (ref 4.0–10.5)
nRBC: 0 % (ref 0.0–0.2)

## 2022-12-28 LAB — BLOOD GAS, VENOUS
Acid-Base Excess: 2.8 mmol/L — ABNORMAL HIGH (ref 0.0–2.0)
Bicarbonate: 29.4 mmol/L — ABNORMAL HIGH (ref 20.0–28.0)
Drawn by: 27160
O2 Saturation: 28.3 %
Patient temperature: 36.9
pCO2, Ven: 52 mm[Hg] (ref 44–60)
pH, Ven: 7.36 (ref 7.25–7.43)
pO2, Ven: <31 mm[Hg] — CL (ref 32–45)

## 2022-12-28 LAB — BRAIN NATRIURETIC PEPTIDE: B Natriuretic Peptide: 80 pg/mL (ref 0.0–100.0)

## 2022-12-28 LAB — COMPREHENSIVE METABOLIC PANEL
ALT: 18 U/L (ref 0–44)
AST: 26 U/L (ref 15–41)
Albumin: 3.5 g/dL (ref 3.5–5.0)
Alkaline Phosphatase: 45 U/L (ref 38–126)
Anion gap: 7 (ref 5–15)
BUN: 11 mg/dL (ref 8–23)
CO2: 25 mmol/L (ref 22–32)
Calcium: 8.4 mg/dL — ABNORMAL LOW (ref 8.9–10.3)
Chloride: 105 mmol/L (ref 98–111)
Creatinine, Ser: 1.04 mg/dL (ref 0.61–1.24)
GFR, Estimated: >60 mL/min (ref >60)
Glucose, Bld: 97 mg/dL (ref 70–99)
Potassium: 3.7 mmol/L (ref 3.5–5.1)
Sodium: 137 mmol/L (ref 135–145)
Total Bilirubin: 1.1 mg/dL (ref <1.2)
Total Protein: 6.9 g/dL (ref 6.5–8.1)

## 2022-12-28 LAB — RESP PANEL BY RT-PCR (RSV, FLU A&B, COVID)  RVPGX2
Influenza A by PCR: NEGATIVE
Influenza B by PCR: NEGATIVE
Resp Syncytial Virus by PCR: NEGATIVE
SARS Coronavirus 2 by RT PCR: NEGATIVE

## 2022-12-28 MED ORDER — PREDNISONE 50 MG PO TABS: 60.0000 mg | ORAL_TABLET | Freq: Every day | ORAL | Status: DC

## 2022-12-28 MED ORDER — CEFPODOXIME PROXETIL 200 MG PO TABS: 200.0000 mg | ORAL_TABLET | Freq: Two times a day (BID) | ORAL | 0 refills | Status: AC

## 2022-12-28 MED ORDER — SODIUM CHLORIDE 0.9 % IV SOLN
1.0000 g | Freq: Once | INTRAVENOUS | Status: AC
Start: 2022-12-28 — End: 2022-12-28
  Administered 2022-12-28: 1 g via INTRAVENOUS
  Filled 2022-12-28: qty 10

## 2022-12-28 MED ORDER — PREDNISONE 10 MG PO TABS
40.0000 mg | ORAL_TABLET | Freq: Every day | ORAL | 0 refills | Status: AC
Start: 2022-12-28 — End: 2023-01-01

## 2022-12-28 MED ORDER — AZITHROMYCIN 250 MG PO TABS: 250.0000 mg | ORAL_TABLET | Freq: Every day | ORAL | 0 refills | Status: DC

## 2022-12-28 MED ORDER — IPRATROPIUM-ALBUTEROL 0.5-2.5 (3) MG/3ML IN SOLN
3.0000 mL | Freq: Once | RESPIRATORY_TRACT | Status: AC
  Administered 2022-12-28: 3 mL via RESPIRATORY_TRACT
  Filled 2022-12-28: qty 3

## 2022-12-28 MED ORDER — SODIUM CHLORIDE 0.9 % IV SOLN
500.0000 mg | INTRAVENOUS | Status: DC
  Administered 2022-12-28: 500 mg via INTRAVENOUS
  Filled 2022-12-28: qty 5

## 2022-12-28 MED ORDER — ALBUTEROL SULFATE HFA 108 (90 BASE) MCG/ACT IN AERS
2.0000 | INHALATION_SPRAY | Freq: Once | RESPIRATORY_TRACT | Status: AC
  Administered 2022-12-28: 2 via RESPIRATORY_TRACT
  Filled 2022-12-28: qty 6.7

## 2022-12-28 NOTE — ED Provider Notes (Signed)
[place] EMERGENCY DEPARTMENT AT [facility] Provider Note   CSN: [id number] Arrival date & time: 12/28/22  4010     History  Chief Complaint  Patient presents with   Cough   Shortness of Breath    Alexander Nunez is a 77 y.o. male.   Cough Associated symptoms: shortness of breath   Shortness of Breath Associated symptoms: cough   Patient presents for cough and shortness of breath.  Medical history includes anxiety, arthritis, GERD, IBS, pulmonary fibrosis, HLD, sleep apnea.  A week ago, his granddaughter had URI symptoms.  Since that time, he has also had chest congestion, cough.  Cough was initially productive of clear sputum only.  He was seen by his PCP on Wednesday.  He was started on doxycycline.  He feels like his symptoms have been worsening, despite antibiotics.  This morning, he developed yellow sputum.  He has a history of pulmonary fibrosis.  He does not use breathing treatments at home.  He was not prescribed a steroid earlier this week.  He states that he is on a immunosuppressive medication, prescribed by his pulmonologist.      Home Medications Prior to Admission medications   Medication Sig Start Date End Date Taking? Authorizing Provider  azithromycin (ZITHROMAX) 250 MG tablet Take 1 tablet (250 mg total) by mouth daily. Take 1 every day until finished, starting tomorrow. 12/28/22  Yes Gloris Manchester, MD  cefpodoxime (VANTIN) 200 MG tablet Take 1 tablet (200 mg total) by mouth 2 (two) times daily for 4 days. Starting tomorrow 12/28/22 01/01/23 Yes Gloris Manchester, MD  predniSONE (DELTASONE) 10 MG tablet Take 4 tablets (40 mg total) by mouth daily for 4 days. Starting tomorrow 12/28/22 01/01/23 Yes Gloris Manchester, MD  albuterol (VENTOLIN HFA) 108 (90 Base) MCG/ACT inhaler Inhale 1-2 puffs into the lungs every 4 (four) hours as needed for shortness of breath or wheezing. 11/14/21   Lupita Leash, MD  alclomethasone (ACLOVATE) 0.05 % cream Apply 1 Application  topically 2 (two) times daily. 05/20/22   [provider]  ALPRAZolam Prudy Feeler) 0.5 MG tablet Take 0.25-0.5 mg by mouth 3 (three) times daily as needed for sleep.    [provider]  azaTHIOprine (IMURAN) 50 MG tablet Take 2 tablets (100 mg total) by mouth daily. 11/28/22   [name], Colbert Coyer, MD  Azelastine HCl 137 MCG/SPRAY SOLN PLACE 2 SPRAYS INTO BOTH NOSTRILS 2 TIMES DAILY AS NEEDED FOR RHINITIS. 02/04/22   Allison Quarry, Ruby Cola, NP  doxazosin (CARDURA) 4 MG tablet Take 4 mg by mouth at bedtime.      [provider]  fluticasone (FLONASE) 50 MCG/ACT nasal spray Place 2 sprays into both nostrils daily. 09/11/22   Leath-Warren, Sadie Haber, NP  losartan (COZAAR) 25 MG tablet Take 25 mg by mouth daily. 08/28/21   [provider]  metoprolol tartrate (LOPRESSOR) 25 MG tablet Take 25 mg by mouth 2 (two) times daily.    [provider]  Nintedanib (OFEV) 150 MG CAPS Take 1 capsule (150 mg total) by mouth 2 (two) times daily. 12/16/22   [name], Colbert Coyer, MD  pantoprazole (PROTONIX) 40 MG tablet Take 40 mg by mouth daily.    [provider]  pravastatin (PRAVACHOL) 20 MG tablet Take 20 mg by mouth every evening.     [provider]      Allergies    Cephalexin and Contrast media [iodinated contrast media]    Review of Systems   Review of Systems  HENT:  Positive for congestion.   Respiratory:  Positive for cough and shortness of breath.   All other systems reviewed and are negative.   Physical Exam Updated Vital Signs BP (!) 149/72 (BP Location: Left Arm)   Pulse (!) 58   Temp 98.4 F (36.9 C) (Oral)   Resp (!) 21   Ht 5\' 11"  (1.803 m)   Wt 117 kg   SpO2 94%   BMI 35.98 kg/m  Physical Exam Vitals and nursing note reviewed.  Constitutional:      General: He is not in acute distress.    Appearance: He is well-developed. He is not ill-appearing, toxic-appearing or diaphoretic.  HENT:     Head: Normocephalic and atraumatic.      Mouth/Throat:     Mouth: Mucous membranes are moist.  Eyes:     Conjunctiva/sclera: Conjunctivae normal.  Cardiovascular:     Rate and Rhythm: Normal rate and regular rhythm.     Heart sounds: No murmur heard. Pulmonary:     Effort: Pulmonary effort is normal. No tachypnea or respiratory distress.     Breath sounds: Wheezing present. No rhonchi or rales.  Chest:     Chest wall: No tenderness.  Abdominal:     Palpations: Abdomen is soft.     Tenderness: There is no abdominal tenderness.  Musculoskeletal:        General: No swelling. Normal range of motion.     Cervical back: Normal range of motion and neck supple.     Right lower leg: No edema.     Left lower leg: No edema.  Skin:    General: Skin is warm and dry.     Coloration: Skin is not cyanotic or pale.  Neurological:     General: No focal deficit present.     Mental Status: He is alert and oriented to person, place, and time.  Psychiatric:        Mood and Affect: Mood normal.        Behavior: Behavior normal.     ED Results / Procedures / Treatments   Labs (all labs ordered are listed, but only abnormal results are displayed) Labs Reviewed  COMPREHENSIVE METABOLIC PANEL - Abnormal; Notable for the following components:      Result Value   Calcium 8.4 (*)    All other components within normal limits  CBC WITH DIFFERENTIAL/PLATELET - Abnormal; Notable for the following components:   WBC 12.2 (*)    RBC 4.03 (*)    Hemoglobin 12.7 (*)    HCT 38.7 (*)    Neutro Abs 8.2 (*)    Monocytes Absolute 1.6 (*)    All other components within normal limits  BLOOD GAS, VENOUS - Abnormal; Notable for the following components:   pO2, Ven <31 (*)    Bicarbonate 29.4 (*)    Acid-Base Excess 2.8 (*)    All other components within normal limits  RESP PANEL BY RT-PCR (RSV, FLU A&B, COVID)  RVPGX2  BRAIN NATRIURETIC PEPTIDE    EKG EKG Interpretation Date/Time:  Sunday December 28 2022 10:53:04 EST Ventricular Rate:  69 PR  Interval:    QRS Duration:  116 QT Interval:  408 QTC Calculation: 438 R Axis:   32  Text Interpretation: Sinus rhythm Incomplete right bundle branch block Low voltage, precordial leads Baseline wander in lead(s) V1 Confirmed by Gloris Manchester [phone]) on 12/28/2022 11:26:51 AM  Radiology DG Chest Port 1 View  Result Date: 12/28/2022 CLINICAL DATA:  Shortness of breath.  EXAM: PORTABLE CHEST 1 VIEW COMPARISON:  07/17/2021. FINDINGS: Low lung volume. Redemonstration of extensive coarse bronchovascular markings and nonspecific opacities throughout bilateral lungs, essentially similar to the prior study. No new dense consolidation or lung collapse. Bilateral costophrenic angles are clear. No pneumothorax. Stable cardio-mediastinal silhouette. No acute osseous abnormalities. The soft tissues are within normal limits. IMPRESSION: *Redemonstration of extensive chronic nonspecific opacities throughout bilateral lungs, which corresponds to interstitial lung disease on prior CT scan. *No new dense consolidation or lung collapse.  No pneumothorax. Electronically Signed   By: Jules Schick M.D.   On: 12/28/2022 10:21    Procedures Procedures    Medications Ordered in ED Medications  predniSONE (DELTASONE) tablet 60 mg (has no administration in time range)  cefTRIAXone (ROCEPHIN) 1 g in sodium chloride 0.9 % 100 mL IVPB (1 g Intravenous New Bag/Given 12/28/22 1255)  azithromycin (ZITHROMAX) 500 mg in sodium chloride 0.9 % 250 mL IVPB (has no administration in time range)  albuterol (VENTOLIN HFA) 108 (90 Base) MCG/ACT inhaler 2 puff (has no administration in time range)  ipratropium-albuterol (DUONEB) 0.5-2.5 (3) MG/3ML nebulizer solution 3 mL (3 mLs Nebulization Given 12/28/22 1043)    ED Course/ Medical Decision Making/ A&P                                 Medical Decision Making Amount and/or Complexity of Data Reviewed Labs: ordered. Radiology: ordered.  Risk Prescription drug  management.   This patient presents to the ED for concern of cough and shortness of breath, this involves an extensive number of treatment options, and is a complaint that carries with it a high risk of complications and morbidity.  The differential diagnosis includes exacerbation of reactive airway disease, pneumonia, URI, bronchitis   Co morbidities that complicate the patient evaluation  anxiety, arthritis, GERD, IBS, pulmonary fibrosis, HLD, sleep apnea   Additional history obtained:  Additional history obtained from N/A External records from outside source obtained and reviewed including EMR   Lab Tests:  I Ordered, and personally interpreted labs.  The pertinent results include: Baseline anemia, mild leukocytosis, normal kidney function, normal electrolytes, normal BNP.  COVID/flu negative.   Imaging Studies ordered:  I ordered imaging studies including chest x-ray I independently visualized and interpreted imaging which showed no acute findings I agree with the radiologist interpretation   Cardiac Monitoring: / EKG:  The patient was maintained on a cardiac monitor.  I personally viewed and interpreted the cardiac monitored which showed an underlying rhythm of: Sinus rhythm   Problem List / ED Course / Critical interventions / Medication management  Patient presents for cough and shortness of breath, worsening over the past week.  This is despite doxycycline for the past 4 days.  Today, cough again productive of yellow sputum.  On arrival, patient is overall well-appearing.  His breathing is unlabored.  He is able to speak in complete sentences.  SpO2 is normal on room air.  On lung auscultation, he does have expiratory wheezing throughout lung fields.  DuoNeb and prednisone were ordered.  Workup was initiated.  Chest x-ray does not show acute findings.  Lab work is notable for mild leukocytosis.  On reassessment, patient has improved symptoms following breathing treatment.   Given his history of pulmonary fibrosis with wheezing on exam and new onset of productive cough, patient to be treated for reactive airway disease exacerbation.  Antibiotics were ordered in the ED.  Patient  was prescribed ongoing antibiotics and steroid.  He was discharged in stable condition. I ordered medication including DuoNeb, albuterol, prednisone, ceftriaxone, azithromycin for reactive airway disease exacerbation Reevaluation of the patient after these medicines showed that the patient improved I have reviewed the patients home medicines and have made adjustments as needed   Social Determinants of Health:  Has PCP         Final Clinical Impression(s) / ED Diagnoses Final diagnoses:  Mild intermittent reactive airway disease with acute exacerbation    Rx / DC Orders ED Discharge Orders          Ordered    azithromycin (ZITHROMAX) 250 MG tablet  Daily        12/28/22 1304    cefpodoxime (VANTIN) 200 MG tablet  2 times daily        12/28/22 1304    predniSONE (DELTASONE) 10 MG tablet  Daily        12/28/22 1304              Gloris Manchester, MD 12/28/22 1304

## 2022-12-28 NOTE — ED Notes (Signed)
This nurse has  taken over this patient from nurse Darl Pikes RN

## 2022-12-28 NOTE — Discharge Instructions (Addendum)
Prescriptions for 2 antibiotics and 1 steroid medication were sent to your pharmacy.  Take as prescribed starting tomorrow.  You received your first doses while in the emergency department.  You can discontinue your doxycycline.  Use the albuterol inhaler as needed.  Follow-up with your primary care doctor.  Return to the emergency department for any new or worsening symptoms of concern.

## 2022-12-28 NOTE — ED Triage Notes (Signed)
Pt states he is being treated for respiratory infection by primary. Pt was seen in office Wednesday. Complains of congestion with productive cough and yellowish phlegm. Pt states symptoms are becoming worse. Pt states his O2 sats "have been running low at 90%." No fever reported. Pt has pulmonary fibrosis.

## 2022-12-29 DIAGNOSIS — G4730 Sleep apnea, unspecified: Secondary | ICD-10-CM | POA: Diagnosis not present

## 2022-12-29 DIAGNOSIS — R0902 Hypoxemia: Secondary | ICD-10-CM | POA: Diagnosis not present

## 2023-01-05 NOTE — Telephone Encounter (Signed)
Signed provider portion received and faxed to [facility] for renewal. Will wait for determination.

## 2023-01-08 ENCOUNTER — Other Ambulatory Visit: Payer: Self-pay | Admitting: Pulmonary Disease

## 2023-01-21 DIAGNOSIS — G4733 Obstructive sleep apnea (adult) (pediatric): Secondary | ICD-10-CM | POA: Diagnosis not present

## 2023-01-21 DIAGNOSIS — I10 Essential (primary) hypertension: Secondary | ICD-10-CM | POA: Diagnosis not present

## 2023-02-09 ENCOUNTER — Telehealth: Payer: Self-pay | Admitting: Pulmonary Disease

## 2023-02-09 ENCOUNTER — Ambulatory Visit (INDEPENDENT_AMBULATORY_CARE_PROVIDER_SITE_OTHER): Payer: Medicare HMO | Admitting: Gastroenterology

## 2023-02-09 NOTE — Telephone Encounter (Signed)
 Patient states that oxygen levels have been dropping lately especially when he moves. He also feels that he is getting an upper respiratory infection. Please call and advise [phone]

## 2023-02-10 MED ORDER — AZITHROMYCIN 250 MG PO TABS: ORAL_TABLET | ORAL | 0 refills | Status: DC

## 2023-02-10 MED ORDER — PREDNISONE 10 MG PO TABS: ORAL_TABLET | ORAL | 0 refills | Status: DC

## 2023-02-10 NOTE — Telephone Encounter (Addendum)
 Called patient.  Gave all information.  Patient states Dr. Marchelle Gearing called him and reviewed information.  Prednisone and Zpak sent in.  Patient verbalized understanding.

## 2023-02-10 NOTE — Telephone Encounter (Signed)
 Chest congestion x 3 days.  Some cough during afternoons.  Oxygen  is dropping into med 80's when patient walks around the house.  Oxygen  does come back to mid 90's when at rest.  No fever.  Patient is using Azelastine , fluticasone  and albuterol .  Patient states this is unusual for his oxygen  to drop into the mid 80's with exertion.  Please advise. [facility]

## 2023-02-10 NOTE — Telephone Encounter (Signed)
 Spoke t patient - sick call from patient routed to me as doc of the day  ILD on ofev  and immuran. Cellcept  in past  - stuffed up nose x 3-4 days - exertional hypoxemia - NEW x last 3-4 days - not coughing a whole lot BUT  - feels chest tightness - No fever - no leg swelling  A Acuute bronchitis  Plan (TIREAGE pls send the medicines - clinic is busy)   - home test for covid - if positive to call us   - Please take Take prednisone  40mg  once daily x 3 days, then 30mg  once daily x 3 days, then 20mg  once daily x 3 days, then prednisone  10mg  once daily  x 3 days and stop  - Z PAK  - go to ER if worse - warned of risk of flare  - suggested clinical trials with inhaled tyvaso v esbreit as care option - he is interested      Latest Ref Rng & Units 11/13/2022    9:42 AM 01/08/2022   10:47 AM 08/29/2020    8:48 AM 10/03/2019    8:47 AM 12/27/2018    1:33 PM  PFT Results  FVC-Pre L 2.19  2.23  2.65  2.90  3.03   FVC-Predicted Pre % 50  50  59  64  67   FVC-Post L 2.22  2.29  2.74   3.00   FVC-Predicted Post % 50  52  61   66   Pre FEV1/FVC % % 90  90  89  85  85   Post FEV1/FCV % % 90  89  88   87   FEV1-Pre L 1.97  2.01  2.36  2.47  2.57   FEV1-Predicted Pre % 62  62  72  75  77   FEV1-Post L 2.00  2.05  2.42   2.61   DLCO uncorrected ml/min/mmHg 11.54  14.84  19.75  21.35  22.56   DLCO UNC% % 44  57  75  81  85   DLCO corrected ml/min/mmHg 11.54  14.84  19.75  21.35    DLCO COR %Predicted % 44  57  75  81    DLVA Predicted % 89  100  116  120  125   TLC L 3.92  3.70  4.55   4.33   TLC % Predicted % 54  51  62   59   RV % Predicted % 47  52  67   47      Allergies  Allergen Reactions   Cephalexin Other (See Comments)    fever   Contrast Media [Iodinated Contrast Media] Hives and Rash    Had persistent rash with only 50mg  benadryl  prior to spinal injection on 09/03/2017, try 13 hr prep in future.       has a past medical history of Anxiety, Arthritis, Back pain,  chronic, Colon polyp, Diverticulitis, GERD (gastroesophageal reflux disease), Heart palpitations, High cholesterol, Hypertension, Irritable bowel syndrome, Osteoporosis, Psoriasis, Pulmonary fibrosis (HCC), Pulmonary fibrosis (HCC), and Sleep apnea.   reports that he quit smoking about 49 years ago. His smoking use included cigarettes. He started smoking about 60 years ago. He has a 22 pack-year smoking history. He has never used smokeless tobacco.  Past Surgical History:  Procedure Laterality Date   BACK SURGERY  2015   CHOLECYSTECTOMY N/A 10/12/2015   Procedure: LAPAROSCOPIC CHOLECYSTECTOMY;  Surgeon: Oneil Budge, MD;  Location: [facility];  Service: General;  Laterality: N/A;   COLONOSCOPY     FOOT FRACTURE SURGERY     left foot   KNEE ARTHROSCOPY     left   TONSILLECTOMY     UPPER GASTROINTESTINAL ENDOSCOPY      Allergies  Allergen Reactions   Cephalexin Other (See Comments)    fever   Contrast Media [Iodinated Contrast Media] Hives and Rash    Had persistent rash with only 50mg  benadryl  prior to spinal injection on 09/03/2017, try 13 hr prep in future.    Immunization History  Administered Date(s) Administered   Influenza, High Dose Seasonal PF 10/11/2018   PFIZER(Purple Top)SARS-COV-2 Vaccination 03/11/2019, 04/01/2019   Pneumococcal Polysaccharide-23 10/10/2016   Tdap 08/14/2010, 07/25/2018    Family History  Problem Relation Age of Onset   Arrhythmia Mother        Atrial fib   Arrhythmia Sister        Atrial fib   Stroke Sister    Breast cancer Sister    Bladder Cancer Father            Colon cancer Neg Hx    Stomach cancer Neg Hx    Esophageal cancer Neg Hx    Pancreatic cancer Neg Hx    Prostate cancer Neg Hx    Rectal cancer Neg Hx      Current Outpatient Medications:    albuterol  (VENTOLIN  HFA) 108 (90 Base) MCG/ACT inhaler, Inhale 1-2 puffs into the lungs every 4 (four) hours as needed for shortness of breath or wheezing., Disp: 6.7 g, Rfl: 5    alclomethasone (ACLOVATE) 0.05 % cream, Apply 1 Application topically 2 (two) times daily., Disp: , Rfl:    ALPRAZolam  (XANAX ) 0.5 MG tablet, Take 0.25-0.5 mg by mouth 3 (three) times daily as needed for sleep., Disp: , Rfl:    azaTHIOprine  (IMURAN ) 50 MG tablet, TAKE 2 TABLETS BY MOUTH EVERY DAY, Disp: 180 tablet, Rfl: 2   Azelastine  HCl 137 MCG/SPRAY SOLN, PLACE 2 SPRAYS INTO BOTH NOSTRILS 2 TIMES DAILY AS NEEDED FOR RHINITIS., Disp: 90 mL, Rfl: 1   azithromycin  (ZITHROMAX ) 250 MG tablet, Take 1 tablet (250 mg total) by mouth daily. Take 1 every day until finished, starting tomorrow., Disp: 4 tablet, Rfl: 0   doxazosin  (CARDURA ) 4 MG tablet, Take 4 mg by mouth at bedtime.  , Disp: , Rfl:    fluticasone  (FLONASE ) 50 MCG/ACT nasal spray, Place 2 sprays into both nostrils daily., Disp: 16 g, Rfl: 0   losartan (COZAAR) 25 MG tablet, Take 25 mg by mouth daily., Disp: , Rfl:    metoprolol  tartrate (LOPRESSOR ) 25 MG tablet, Take 25 mg by mouth 2 (two) times daily., Disp: , Rfl:    Nintedanib (OFEV ) 150 MG CAPS, Take 1 capsule (150 mg total) by mouth 2 (two) times daily., Disp: 180 capsule, Rfl: 1   pantoprazole  (PROTONIX ) 40 MG tablet, Take 40 mg by mouth daily., Disp: , Rfl:    pravastatin  (PRAVACHOL ) 20 MG tablet, Take 20 mg by mouth every evening. , Disp: , Rfl:

## 2023-02-13 DIAGNOSIS — R7989 Other specified abnormal findings of blood chemistry: Secondary | ICD-10-CM | POA: Diagnosis not present

## 2023-03-04 ENCOUNTER — Ambulatory Visit: Payer: Medicare HMO | Admitting: Pulmonary Disease

## 2023-03-05 NOTE — Telephone Encounter (Signed)
Received a fax from  [facility] regarding an approval for OFEV patient assistance from 03/04/23 to 02/03/24. Approval letter sent to scan center.  Phone #: [phone] Fax #: [phone] Patient ID: [id number]  Chesley Mires, PharmD, MPH, BCPS, CPP Clinical Pharmacist (Rheumatology and Pulmonology)

## 2023-04-01 DIAGNOSIS — I10 Essential (primary) hypertension: Secondary | ICD-10-CM | POA: Diagnosis not present

## 2023-04-01 DIAGNOSIS — G4733 Obstructive sleep apnea (adult) (pediatric): Secondary | ICD-10-CM | POA: Diagnosis not present

## 2023-04-08 ENCOUNTER — Encounter: Payer: Self-pay | Admitting: Pulmonary Disease

## 2023-04-08 ENCOUNTER — Ambulatory Visit: Payer: Medicare HMO | Admitting: Pulmonary Disease

## 2023-04-08 VITALS — BP 128/72 | HR 65 | Ht 71.0 in | Wt 251.0 lb

## 2023-04-08 DIAGNOSIS — J849 Interstitial pulmonary disease, unspecified: Secondary | ICD-10-CM

## 2023-04-08 DIAGNOSIS — G4733 Obstructive sleep apnea (adult) (pediatric): Secondary | ICD-10-CM | POA: Diagnosis not present

## 2023-04-08 DIAGNOSIS — Z5181 Encounter for therapeutic drug level monitoring: Secondary | ICD-10-CM

## 2023-04-08 LAB — COMPREHENSIVE METABOLIC PANEL
ALT: 13 U/L (ref 0–53)
AST: 19 U/L (ref 0–37)
Albumin: 4 g/dL (ref 3.5–5.2)
Alkaline Phosphatase: 59 U/L (ref 39–117)
BUN: 17 mg/dL (ref 6–23)
CO2: 28 meq/L (ref 19–32)
Calcium: 9.5 mg/dL (ref 8.4–10.5)
Chloride: 104 meq/L (ref 96–112)
Creatinine, Ser: 1.17 mg/dL (ref 0.40–1.50)
GFR: 60.38 mL/min (ref >60.00)
Glucose, Bld: 91 mg/dL (ref 70–99)
Potassium: 4.4 meq/L (ref 3.5–5.1)
Sodium: 139 meq/L (ref 135–145)
Total Bilirubin: 0.6 mg/dL (ref 0.2–1.2)
Total Protein: 7.4 g/dL (ref 6.0–8.3)

## 2023-04-08 NOTE — Progress Notes (Signed)
 Alexander Nunez    [id number]    1945/04/07  Primary Care Physician:Fusco, Lyman Bishop, MD  Referring Physician: Elfredia Nevins, MD [address] [place],  [place] [address]  Problem list:  Follow-up for ILD, pulmonary fibrosis On Ofev since Jan 2023 Cellcept and myfortic poorly tolerated in 2024 ON Azathoprine since July 2024, dose increased to 100 mg in Oct 2024  HPI: 78 y.o. with history of hypertension, hyperlipidemia, sleep apnea, psoriasis  2020 Had an incidental finding of interstitial lung disease on CT abdomen which was done for diverticulitis He has seen Dr. Shaune Pollack at [facility] who did a high resolution CT with findings of alternate diagnosis, possible HP.  He had HP panel on 12/28/2018 which is negative. History notable for psoriasis with skin involvement.  He was on methotrexate for a couple of years and stopped as his skin rash improved.  Last dose of methotrexate was several years ago.  2021 Had COVID-19 infection December 2021, referred for monoclonal antibody  2023 Discussed at multidisciplinary conference on 10/15/2021 CTs show progressive interstitial lung disease and alternate pattern. Differential diagnoses include chronic HP versus NSIP.  Recommend a biopsy and would favor surgical lung biopsy as transbronchial and BAL may not give a definitive diagnosis. MDC see also recommends empiric treatment with antifibrotic's +/- anti-inflammatory if pathology is suggestive.  Started Ofev in first week of Jan 2023.  He is tolerating it well so far with no issue Has started pulmonary rehab and is doing well States that breathing is better.  Started on CellCept in February 2024.  He developed rash on his skin immediately after.  He was treated with antifungal cream and discontinuation of CellCept with improvement in symptoms.  The symptoms recurred after he rechallenge himself with CellCept and is now completely off the medication. Started my Myfortic in  April 2024 this was also poorly tolerated with weight loss and fatigue and he is off this medication as well Started azathioprine  Pets: Has a dog.  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  His hobbies include gardening but no significant exposure to mold,No hot tub, Jacuzzi, no down pillows or comforters.  He uses talcum powder every day.  May have had Freon leak from his HVAC system. Likes to play golf.   Smoking history: 30-pack-year smoker.  Quit in 1980 Travel history: No significant travel history Relevant family history: No significant family history of lung disease.  Interval history: Discussed the use of AI scribe software for clinical note transcription with the patient, who gave verbal consent to proceed.  [name] "Alexander Nunez" is a 78 year old male with interstitial lung disease who presents with increased mucus production and difficulty breathing.  He has experienced increased mucus production over the past three to four days, associated with difficulty taking deep breaths. These symptoms worsen with physical activity, such as vacuuming, during which his oxygen saturation dropped to 87% but returned to normal after resting. He speculates that the symptoms might be related to allergies or weather changes and mentions having allergy medication at home.  He is currently being treated for interstitial lung disease with azathioprine and Ofev, both taken twice daily. The azathioprine dose was recently increased to two tablets, and he tolerates it well, although he experiences frequent diarrhea, for which he takes  Imodium every other day as needed. He also experiences gas and plans to consult his gastroenterologist regarding these symptoms, noting a history of irritable bowel syndrome.  He mentions  having a CT scan and lung function test in November, which were stable at that time. He has not had labs done since November of the previous year and acknowledges the need for updated tests.  He recalls undergoing a sleep study in May of the previous year to assess the need for nocturnal oxygen, but he has not received the results. He continues to use a CPAP machine without supplemental oxygen and notes that his oxygen levels during the previous sleep study averaged 94%, with a low of 81%. He is awaiting follow-up on the results of the more recent study.   Outpatient Encounter Medications as of 04/08/2023  Medication Sig   albuterol (VENTOLIN HFA) 108 (90 Base) MCG/ACT inhaler Inhale 1-2 puffs into the lungs every 4 (four) hours as needed for shortness of breath or wheezing.   alclomethasone (ACLOVATE) 0.05 % cream Apply 1 Application topically 2 (two) times daily.   ALPRAZolam (XANAX) 0.5 MG tablet Take 0.25-0.5 mg by mouth 3 (three) times daily as needed for sleep.   azaTHIOprine (IMURAN) 50 MG tablet TAKE 2 TABLETS BY MOUTH EVERY DAY   Azelastine HCl 137 MCG/SPRAY SOLN PLACE 2 SPRAYS INTO BOTH NOSTRILS 2 TIMES DAILY AS NEEDED FOR RHINITIS.   doxazosin (CARDURA) 4 MG tablet Take 4 mg by mouth at bedtime.     fluticasone (FLONASE) 50 MCG/ACT nasal spray Place 2 sprays into both nostrils daily.   losartan (COZAAR) 25 MG tablet Take 25 mg by mouth daily.   metoprolol tartrate (LOPRESSOR) 50 MG tablet Take 50 mg by mouth 2 (two) times daily.   montelukast (SINGULAIR) 10 MG tablet Take 10 mg by mouth daily.   Nintedanib (OFEV) 150 MG CAPS Take 1 capsule (150 mg total) by mouth 2 (two) times daily.   pantoprazole (PROTONIX) 40 MG tablet Take 40 mg by mouth daily.   pravastatin (PRAVACHOL) 20 MG tablet Take 20 mg by mouth every evening.    [DISCONTINUED] azithromycin (ZITHROMAX) 250 MG tablet Take 1 tablet (250 mg total) by mouth daily. Take 1 every day until finished, starting tomorrow.    [DISCONTINUED] azithromycin (ZITHROMAX) 250 MG tablet Take 2 tablets on day one, then one tablet daily, days 2 - 5.   [DISCONTINUED] metoprolol tartrate (LOPRESSOR) 25 MG tablet Take 25 mg by mouth 2 (two) times daily.   [DISCONTINUED] predniSONE (DELTASONE) 10 MG tablet 4 tablets once daily x 3 days, then 3 tablets once daily x 3 days, then 2 tablets once daily x 3 days, then 1 tablet once daily x 3 days and stop   No facility-administered encounter medications on file as of 04/08/2023.   Physical Exam: Blood pressure 128/72, pulse 65, height 5\' 11"  (1.803 m), weight 251 lb (113.9 kg), SpO2 94%. Gen:      No acute distress HEENT:  EOMI, sclera anicteric Neck:     No masses; no thyromegaly Lungs:    Clear to auscultation bilaterally; normal respiratory effort CV:         Regular rate and rhythm; no murmurs Abd:      + bowel sounds; soft, non-tender; no palpable masses, no distension Ext:    No edema; adequate peripheral perfusion Skin:      Warm and dry; no rash Neuro: alert and oriented x 3 Psych: normal mood and affect   Data Reviewed:  Imaging: High-resolution CT 12/24/2018-mild emphysema, traction bronchiectasis, patchy groundglass and reticulation.  More prominent in the upper lobes with no basal gradient.  Air-trapping present. High-res CT 09/19/2019-stable to minimally changed interstitial lung disease in alternate pattern  CT 03/25/2021-interstitial lung disease with superimposed groundglass opacities in the right upper and left lower lobe.  High-resolution CT 09/12/2021-upper lobe predominant fibrotic interstitial lung disease and alternate pattern.  High resolution CT 11/28/2022-stable/minimally progressive findings of interstitial lung disease in alternate pattern. I have reviewed the images personally.  PFTs: 01/06/2019 FVC 3 [66%], FEV1 2.61 [78%], F/F 87, TLC 4.33 [59%], DLCO 22.56 [85%] Severe restriction with no obstruction.  Diffusion capacity is normal  10/03/2019 FVC  2.90 [64%], FEV1 2.47 [94%], DLCO 21.35 [81%] Moderate restriction  08/29/2020 FVC 2.14 [1%], FEV1 2.42 [74%], TLC 4.55 [62%], DLCO 19.75 [75%] Moderate restriction, mild diffusion defect  01/08/2022 FVC 2.29 [52%], FEV1 2.05 [64%], F/F89, TLC 3.70 [51%], DLCO 14.84 [57%]  11/13/2022 FVC 2.22 [50%], FEV1 2.00 [62%], F/F90, TLC 3.92 [54%], DLCO 11.54 [44%] Moderate restriction, moderate diffusion defect  Labs: Hypersensitivity panel 12/28/2018- Negative CTD profile 02/10/2019- Negative  Sleep: Home sleep study 08/03/2021 Moderate OSA with AHI 28.6, low O2 sat of 81%.  Assessment:  Interstitial lung disease CT scan reviewed with alternate pattern of pulmonary fibrosis with upper lobe predominance.  This could be hypersensitivity pneumonitis though he does not have any significant exposures  Lab work including CTD serologies, hypersensitivity panel is negative.  Has remote exposure to asbestos but CT scan is not typical of asbestosis.  His CT may show slight progression.  We had multiple discussions including today for further work-up in detail with patient, including wait and watch with monitoring, bronchoscope with BAL, transbronchial biopsies or surgical lung biopsy.  He is very reluctant to undergo an invasive procedure. Per multidisciplinary conference recommendations for biopsy, treatment with antifibrotic's +/- anti-inflammatory.  After long discussion with patient and family we have decided to start treatment with antifibrotic's.   He is tolerating Ofev well so far.  CellCept tried earlier this year but developed a rash. Myfortic was poorly tolerated as well. On azathioprine dose to 100 mg/day.  Finished pulmonary rehab  CT reviewed with minimal changes.  PFT showed reduced diffusion capacity but the test is unreliable as it was nonreproducible.  - Order labs today - Repeat CT scan and lung function test in six months  OSA He has diagnosis of OSA 30 years ago.  On auto CPAP  on room air CPAP download reviewed with good compliance and response with residual AHI of 2.2 Awaiting results from recent overnight oximetry on room air to determine need for nocturnal oxygen.   Obesity He has weight gain recently which is suspect is contributing to his restriction and dyspnea Advised weight loss with diet and exercise   Plan/Recommendations: - Check  labs for monitoring - Continue Ofev, azathioprine - Follow results of overnight oximetry on CPAP - High-res CT, PFTs in 6 months  Chilton Greathouse MD [facility] 04/08/2023, 1:40 PM  CC: Elfredia Nevins, MD

## 2023-04-08 NOTE — Patient Instructions (Signed)
 VISIT SUMMARY:  Today, we discussed your increased mucus production and difficulty breathing, which have been affecting you over the past few days. We also reviewed your current treatment for interstitial lung disease, your use of a CPAP machine for sleep apnea, and your symptoms related to irritable bowel syndrome. Additionally, we talked about the need for updated lab tests and future imaging studies.  YOUR PLAN:  -INTERSTITIAL LUNG DISEASE: Interstitial lung disease is a group of disorders that cause scarring of the lungs, leading to breathing difficulties. You reported increased mucus production and difficulty breathing, especially during physical activity. We will continue your current medications, azathioprine and Ofev, and manage your diarrhea with Imodium. We will also order lab tests today and schedule a repeat CT scan and lung function test in six months.  -SLEEP APNEA: Sleep apnea is a condition where breathing repeatedly stops and starts during sleep. You are currently using a CPAP machine and awaiting results from a recent sleep study to determine if you need nocturnal oxygen. We will follow up on these results to make any necessary adjustments.  -IRRITABLE BOWEL SYNDROME: Irritable bowel syndrome (IBS) is a common disorder that affects the large intestine, causing symptoms like diarrhea and gas. You are managing these symptoms with Imodium and plan to follow up with your gastroenterologist. We discussed how IBS can impact your overall health and ways to manage your symptoms.  -GENERAL HEALTH MAINTENANCE: It is important to regularly monitor your health through routine lab tests and imaging studies. We will order lab tests today to update your health information.  INSTRUCTIONS:  Please complete the lab tests ordered today. We will schedule a repeat CT scan and lung function test in six months. Follow up with your gastroenterologist regarding your IBS symptoms. Additionally, we will  follow up on the results of your recent sleep study to determine if you need nocturnal oxygen.  For more information, you can read your full clinical note, available in your patient portal.

## 2023-04-09 ENCOUNTER — Telehealth: Payer: Self-pay | Admitting: Pulmonary Disease

## 2023-04-09 DIAGNOSIS — I700 Atherosclerosis of aorta: Secondary | ICD-10-CM | POA: Diagnosis not present

## 2023-04-09 DIAGNOSIS — E785 Hyperlipidemia, unspecified: Secondary | ICD-10-CM | POA: Diagnosis not present

## 2023-04-09 DIAGNOSIS — G4733 Obstructive sleep apnea (adult) (pediatric): Secondary | ICD-10-CM | POA: Diagnosis not present

## 2023-04-09 DIAGNOSIS — I10 Essential (primary) hypertension: Secondary | ICD-10-CM | POA: Diagnosis not present

## 2023-04-09 DIAGNOSIS — J849 Interstitial pulmonary disease, unspecified: Secondary | ICD-10-CM | POA: Diagnosis not present

## 2023-04-09 DIAGNOSIS — F419 Anxiety disorder, unspecified: Secondary | ICD-10-CM | POA: Diagnosis not present

## 2023-04-09 DIAGNOSIS — K219 Gastro-esophageal reflux disease without esophagitis: Secondary | ICD-10-CM | POA: Diagnosis not present

## 2023-04-09 DIAGNOSIS — E042 Nontoxic multinodular goiter: Secondary | ICD-10-CM | POA: Diagnosis not present

## 2023-04-09 DIAGNOSIS — N411 Chronic prostatitis: Secondary | ICD-10-CM | POA: Diagnosis not present

## 2023-04-09 DIAGNOSIS — E669 Obesity, unspecified: Secondary | ICD-10-CM | POA: Diagnosis not present

## 2023-04-09 DIAGNOSIS — I2584 Coronary atherosclerosis due to calcified coronary lesion: Secondary | ICD-10-CM | POA: Diagnosis not present

## 2023-04-09 DIAGNOSIS — R002 Palpitations: Secondary | ICD-10-CM | POA: Diagnosis not present

## 2023-04-09 LAB — CBC WITH DIFFERENTIAL/PLATELET
Basophils Absolute: 0.1 10*3/uL (ref 0.0–0.1)
Basophils Relative: 1 % (ref 0.0–3.0)
Eosinophils Absolute: 0.3 10*3/uL (ref 0.0–0.7)
Eosinophils Relative: 3.3 % (ref 0.0–5.0)
HCT: 41.2 % (ref 39.0–52.0)
Hemoglobin: 13.7 g/dL (ref 13.0–17.0)
Lymphocytes Relative: 21.9 % (ref 12.0–46.0)
Lymphs Abs: 2.3 10*3/uL (ref 0.7–4.0)
MCHC: 33.3 g/dL (ref 30.0–36.0)
MCV: 98.4 fl (ref 78.0–100.0)
Monocytes Absolute: 1.1 10*3/uL — ABNORMAL HIGH (ref 0.1–1.0)
Monocytes Relative: 10 % (ref 3.0–12.0)
Neutro Abs: 6.8 10*3/uL (ref 1.4–7.7)
Neutrophils Relative %: 63.8 % (ref 43.0–77.0)
Platelets: 290 10*3/uL (ref 150.0–400.0)
RBC: 4.19 Mil/uL — ABNORMAL LOW (ref 4.22–5.81)
RDW: 15.2 % (ref 11.5–15.5)
WBC: 10.7 10*3/uL — ABNORMAL HIGH (ref 4.0–10.5)

## 2023-04-09 MED ORDER — MOLNUPIRAVIR 200 MG PO CAPS: 4.0000 | ORAL_CAPSULE | Freq: Two times a day (BID) | ORAL | 0 refills | Status: AC

## 2023-04-09 NOTE — Telephone Encounter (Signed)
 Tried calling the pt and there was no answer and the line just rings without going to voicemail

## 2023-04-09 NOTE — Telephone Encounter (Signed)
 Spoke with the pt  He states that he woke up today with even more congestion and PND  He has dry cough  Denies any wheezing, increased SOB, fevers, aches  He got a call from his friend that he was around the other day that he tested pos for covid  Pt just took a covid test at home at it was pos  He was seen here yesterday 04/08/23 and labs done:  GFR 60.38    He is asking for something to be called in  Please advise, thanks!  Allergies  Allergen Reactions   Cephalexin Other (See Comments)    fever   Contrast Media [Iodinated Contrast Media] Hives and Rash    Had persistent rash with only 50mg  benadryl prior to spinal injection on 09/03/2017, try 13 hr prep in future.

## 2023-04-09 NOTE — Telephone Encounter (Signed)
 I sent in a prescription for molnupiravir Advised to monitor symptoms and call back if there is any worsening Patient informed.  Nothing further needed

## 2023-04-10 ENCOUNTER — Telehealth: Payer: Self-pay

## 2023-04-10 NOTE — Telephone Encounter (Signed)
-----   Message from [facility] [name] H sent at 04/09/2023  8:58 AM EST ----- Regarding: RE: ONO I did not call, since the message came in so late yesterday. ----- Message ----- From: Rosaland Lao, CMA Sent: 04/09/2023   8:46 AM EST To: Dorna Mai, CMA Subject: RE: ONO                                        Were you able to call and request these results? I can do so if needed. ----- Message ----- From: Dorna Mai, CMA Sent: 04/08/2023   4:46 PM EST To: Rosaland Lao, CMA Subject: FW: ONO                                        FYI ----- Message ----- From: Lanna Poche D Sent: 04/08/2023   4:18 PM EST To: [facility]; [facility] Subject: RE: ONO                                        Hi.  You will need to reach out to [facility] to request the ONO results. ----- Message ----- From: Dorna Mai, CMA Sent: 04/08/2023   1:55 PM EST To: [facility] Subject: ONO                                            Patient states he completed ONO (and got a bill for it) but cannot locate results in chart and [name] has not received. Can someone please help locate these? [name] asked for the results to be routed to him ASAP.  Thanks! [name]

## 2023-04-10 NOTE — Telephone Encounter (Signed)
 I have spoken to [facility] with [facility] and requested ONO report. Brad with email results. Once received, I will print and give to Dr. Isaiah Serge for review.

## 2023-04-10 NOTE — Telephone Encounter (Signed)
 ONO received and placed in Dr. Isaiah Serge 'look at folder'.

## 2023-04-22 ENCOUNTER — Encounter: Payer: Self-pay | Admitting: Pulmonary Disease

## 2023-04-22 DIAGNOSIS — D518 Other vitamin B12 deficiency anemias: Secondary | ICD-10-CM | POA: Diagnosis not present

## 2023-04-23 ENCOUNTER — Ambulatory Visit: Payer: Self-pay | Admitting: Internal Medicine

## 2023-04-23 NOTE — Telephone Encounter (Signed)
 Alexander Cowden, MD  Christen Butter, CMA Caller: Unspecified (Today,  8:20 AM) Tomorrow is fine but needs to bring all meds and if condition worse overnight go to ER Pt aware  Nothing further needed

## 2023-04-23 NOTE — Telephone Encounter (Signed)
 [place] patient  The patient tested positive for Covid around 15 days ago and is now testing negative.  He has ongoing chest congestion, productive cough with yellow and clear sputum and worsening shortness of breath. These symptoms have worsened over the past 2-3 days.  He has been using his albuterol inhaler and taking Mucinex.  He is not on supplemental oxygen.  He stated that he feels like he "can't take a deep breath and his oxygen level is dropping more than usual."  It is normally 93-95% with exertion it can get down to 90%. Now it's dropping to 87-89% with coughing and exertion.  He was coughing in the chair this morning prior to calling nurse triage and it was 83% but it came back up to 92% prior to the triage.  He used his albuterol inhaler and took 4 puffs and it came back up to 91-92%.  He stated that the last time he had similar symptoms, he was prescribed prednisone and a z-pak.  Advised that he should be seen as soon as possible in the office.  Called and spoke to [place] concerning availability and the earliest appointment available was tomorrow at 0830.  Discussed going to the ER if his oxygen level decreases again and albuterol dosing instructions.  The patient was agreeable.   Reason for Disposition  [1] Longstanding difficulty breathing (e.g., CHF, COPD, emphysema) AND [2] WORSE than normal  Answer Assessment - Initial Assessment Questions 1. RESPIRATORY STATUS: "Describe your breathing?" (e.g., wheezing, shortness of breath, unable to speak, severe coughing)      Shortness of breath  2. ONSET: "When did this breathing problem begin?"      Possibly 2-3 days worsened  3. PATTERN "Does the difficult breathing come and go, or has it been constant since it started?"      Comes and goes; worse with exertion  4. SEVERITY: "How bad is your breathing?" (e.g., mild, moderate, severe)    - MILD: No SOB at rest, mild SOB with walking, speaks normally in sentences, can lie down, no  retractions, pulse < 100.    - MODERATE: SOB at rest, SOB with minimal exertion and prefers to sit, cannot lie down flat, speaks in phrases, mild retractions, audible wheezing, pulse 100-120.    - SEVERE: Very SOB at rest, speaks in single words, struggling to breathe, sitting hunched forward, retractions, pulse > 120      Coughing and exertion severe  5. RECURRENT SYMPTOM: "Have you had difficulty breathing before?" If Yes, ask: "When was the last time?" and "What happened that time?"      Previous upper  6. CARDIAC HISTORY: "Do you have any history of heart disease?" (e.g., heart attack, angina, bypass surgery, angioplasty)      Hypertension  7. LUNG HISTORY: "Do you have any history of lung disease?"  (e.g., pulmonary embolus, asthma, emphysema)     Pulmonary fibrosis  8. CAUSE: "What do you think is causing the breathing problem?"      Possible infection  9. OTHER SYMPTOMS: "Do you have any other symptoms? (e.g., dizziness, runny nose, cough, chest pain, fever)     Weak, productive cough - yellowish in the morning and clear throughout the day , chest congestion  10. O2 SATURATION MONITOR:  "Do you use an oxygen saturation monitor (pulse oximeter) at home?" If Yes, ask: "What is your reading (oxygen level) today?" "What is your usual oxygen saturation reading?" (e.g., 95%)       Normally 93-95% with  exertion it can get down to 90% Now it's dropping to 87-89% Coughing in the chair it was 83% but it came back up to 92%  Protocols used: Breathing Difficulty-A-AH

## 2023-04-24 ENCOUNTER — Ambulatory Visit

## 2023-04-24 ENCOUNTER — Encounter: Payer: Self-pay | Admitting: Internal Medicine

## 2023-04-24 ENCOUNTER — Ambulatory Visit: Admitting: Internal Medicine

## 2023-04-24 VITALS — BP 108/60 | HR 59 | Temp 97.8°F | Ht 71.0 in | Wt 247.0 lb

## 2023-04-24 DIAGNOSIS — J849 Interstitial pulmonary disease, unspecified: Secondary | ICD-10-CM | POA: Diagnosis not present

## 2023-04-24 MED ORDER — RABEPRAZOLE SODIUM 20 MG PO TBEC: DELAYED_RELEASE_TABLET | ORAL | 11 refills | Status: DC

## 2023-04-24 MED ORDER — AZITHROMYCIN 250 MG PO TABS: ORAL_TABLET | ORAL | 0 refills | Status: DC

## 2023-04-24 MED ORDER — PREDNISONE 10 MG PO TABS: ORAL_TABLET | ORAL | 0 refills | Status: DC

## 2023-04-24 NOTE — Progress Notes (Addendum)
 Alexander Nunez, male    DOB: 1945/07/27   MRN: [id number]   Brief patient profile:  [id number]  quit smoking 1975 s apparent sequelae  [name] pt with NSIP self referred to pulmonary clinic acutely 04/24/2023  for dyspnea       Baseline = still walking grocery shopping ok with sats 88% lowest when    Phone call 04/23/23 The patient tested positive for Covid around 04/09/23 got Molnupiravir  rprior to phone call  and is now testing negative. He has ongoing chest congestion, productive cough with yellow and clear sputum and worsening shortness of breath. These symptoms have worsened over the past 2-3 days. He has been using his albuterol inhaler and taking Mucinex. He is not on supplemental oxygen. He stated that he feels like he "can't take a deep breath and his oxygen level is dropping more than usual." It is normally 93-95% with exertion it can get down to 90%. Now it's dropping to 87-89% with coughing and exertion. He was coughing in the chair this morning prior to calling nurse triage and it was 83% but it came back up to 92% prior to the triage. He used his albuterol inhaler and took 4 puffs and it came back up to 91-92%. He stated that the last time he had similar symptoms, he was prescribed prednisone and a z-pak.   History of Present Illness  04/24/2023  Pulmonary/ 1st office eval/[facility]clane  Chief Complaint  Patient presents with   Acute Visit    Dx with Covid 19 2 wks ago. He c/o lingering SOB and fatigued. Has minimal cough-prod first thing am with yellow sputum.   Dyspnea:  improving as are sats  Cough: better this am / slt yellow / lots of nasal congestion  Sleep: on pillow no resp cc now  SABA use: as above  02 NWG:NFAO     No obvious day to day or daytime pattern/variability or assoc excess/ purulent sputum or mucus plugs or hemoptysis or cp or chest tightness, subjective wheeze or overt   hb symptoms.    Also denies any obvious fluctuation of symptoms with weather or environmental changes  or other aggravating or alleviating factors except as outlined above   No unusual exposure hx or h/o childhood pna/ asthma or knowledge of premature birth.  Current Allergies, Complete Past Medical History, Past Surgical History, Family History, and Social History were reviewed in [employer] record.  ROS  The following are not active complaints unless bolded Hoarseness, sore throat, dysphagia, dental problems, itching, sneezing,  nasal congestion or discharge of excess mucus or purulent secretions, ear ache,   fever, chills, sweats, unintended wt loss or wt gain, classically pleuritic or exertional cp,  orthopnea pnd or arm/hand swelling  or leg swelling, presyncope, palpitations, abdominal pain/ gassy since stared protonix , anorexia, nausea, vomiting, diarrhea  or change in bowel habits or change in bladder habits, change in stools or change in urine, dysuria, hematuria,  rash, arthralgias, visual complaints, headache, numbness, weakness or ataxia or problems with walking or coordination,  change in mood or  memory.             Outpatient Medications Prior to Visit  Medication Sig Dispense Refill   albuterol (VENTOLIN HFA) 108 (90 Base) MCG/ACT inhaler Inhale 1-2 puffs into the lungs every 4 (four) hours as needed for shortness of breath or wheezing. 6.7 g 5   ALPRAZolam (XANAX) 0.5 MG tablet Take 0.25-0.5 mg by mouth 3 (three)  times daily as needed for sleep.     azaTHIOprine (IMURAN) 50 MG tablet TAKE 2 TABLETS BY MOUTH EVERY DAY 180 tablet 2   doxazosin (CARDURA) 4 MG tablet Take 4 mg by mouth at bedtime.       fluticasone (FLONASE) 50 MCG/ACT nasal spray Place 2 sprays into both nostrils daily. 16 g 0   losartan (COZAAR) 25 MG tablet Take 25 mg by mouth daily.     metoprolol tartrate (LOPRESSOR) 50 MG tablet Take 50 mg by mouth 2 (two) times daily.     Nintedanib (OFEV) 150 MG CAPS Take 1 capsule (150 mg total) by mouth 2 (two) times daily. 180 capsule 1    pantoprazole (PROTONIX) 40 MG tablet Take 40 mg by mouth daily.     pravastatin (PRAVACHOL) 20 MG tablet Take 20 mg by mouth every evening.      alclomethasone (ACLOVATE) 0.05 % cream Apply 1 Application topically 2 (two) times daily.     Azelastine HCl 137 MCG/SPRAY SOLN PLACE 2 SPRAYS INTO BOTH NOSTRILS 2 TIMES DAILY AS NEEDED FOR RHINITIS. 90 mL 1   montelukast (SINGULAIR) 10 MG tablet Take 10 mg by mouth daily.     No facility-administered medications prior to visit.    Past Medical History:  Diagnosis Date   Anxiety    Arthritis    back   Back pain, chronic    Colon polyp    Diverticulitis    GERD (gastroesophageal reflux disease)    Heart palpitations    High cholesterol    Hypertension    Irritable bowel syndrome    Osteoporosis    Psoriasis    Pulmonary fibrosis (HCC)    Pulmonary fibrosis (HCC)    Sleep apnea    c- pap      Objective:     BP 108/60 (BP Location: Left Arm, Cuff Size: Normal)   Pulse (!) 59   Temp 97.8 F (36.6 C) (Oral)   Ht 5\' 11"  (1.803 m)   Wt 247 lb (112 kg)   SpO2 98% Comment: on RA  BMI 34.45 kg/m   SpO2: 98 % (on RA)  Amb mod obese  (by BMI ) wm nad   HEENT : Oropharynx  clear      Nasal turbinates nl    NECK :  without  apparent JVD/ palpable Nodes/TM    LUNGS: no acc muscle use,  Nl contour chest  with coarse insp / exp rhonchi bilaterally   CV:  RRR  no s3 or murmur or increase in P2, and no edema   ABD:  soft and nontender   MS:  Gait nl   ext warm without deformities Or obvious joint restrictions  calf tenderness, cyanosis or clubbing    SKIN: warm and dry without lesions    NEURO:  alert, approp, nl sensorium with  no motor or cerebellar deficits apparent.    CXR PA and Lateral:   04/24/2023 :    I personally reviewed images and impression is as follows:       Slightly improved vs prior studies   Assessment   ILD (interstitial lung disease) (HCC) 12/22/2018-CT chest high-res-pulmonary parenchymal pattern  of fibrosis appears slightly more organized than on 07/11/2017 associated with air trapping, findings may be due to chronic hypersensitivity pneumonitis, alternative diagnosis, slight increase in size of mediastinal lymph nodes, dominant right thyroid nodule previously biopsied on 04/15/2017, enlarged pulmonary trunk, emphysema  Mild flare of cough / dyspnea with desats in  setting of covid 19 04/09/22 s/p  molnupiravir   ? Mild acute bronchitis component > rx zpak/ pred  x 6 days taper off     Try change ppi to aciphex 20 mg q am ac given cc too much abd gas since starting  protonix and have not had this problem with aciphex  Use of PPI is associated with improved survival time and with decreased radiologic fibrosis per King's study published in AJRCCM vol 184 p1390.  Dec 2011 and also may have other beneficial effects as per the latest review in [place] vol 193 p1345 Jun 20016. So important to stay o this med  Monitor serially with ex sats:  instructed: Make sure you check your oxygen saturation  AT  your highest level of activity (not after you stop)   to be sure it stays over 90% and adjust  02 flow upward to maintain this level if needed but remember to turn it back to previous settings when you stop (to conserve your supply).    No change maint rx needed          Each maintenance medication was reviewed in detail including emphasizing most importantly the difference between maintenance and prns and under what circumstances the prns are to be triggered using an action plan format where appropriate.  Total time for H and P, chart review, counseling, reviewing hfa/pulse ox  device(s) and generating customized AVS unique to this office visit / same day charting = 40 min with complex pt new to me        Late add 05/06/2023 > there is no contraindication to endoscopic procedures unless having an acute flare of ILD which was not the case at most recent pulmonary ov so I cleared him for elective  endoscopy         Sandrea Hughs, MD 04/24/2023

## 2023-04-24 NOTE — Assessment & Plan Note (Addendum)
 12/22/2018-CT chest high-res-pulmonary parenchymal pattern of fibrosis appears slightly more organized than on 07/11/2017 associated with air trapping, findings may be due to chronic hypersensitivity pneumonitis, alternative diagnosis, slight increase in size of mediastinal lymph nodes, dominant right thyroid  nodule previously biopsied on 04/15/2017, enlarged pulmonary trunk, emphysema  Mild flare of cough / dyspnea with desats in setting of covid 19 04/09/23 s/p  molnupiravir    ? Mild acute bronchitis component > rx zpak/ pred  x 6 days taper off     Try change ppi to aciphex  20 mg q am ac given cc too much abd gas since starting  protonix  and have not had this problem with aciphex   Use of PPI is associated with improved survival time and with decreased radiologic fibrosis per King's study published in AJRCCM vol 184 p1390.  Dec 2011 and also may have other beneficial effects as per the latest review in [place] vol 193 p1345 Jun 20016. So important to stay o this med  Monitor serially with ex sats:  instructed: Make sure you check your oxygen  saturation  AT  your highest level of activity (not after you stop)   to be sure it stays over 90% and adjust  02 flow upward to maintain this level if needed but remember to turn it back to previous settings when you stop (to conserve your supply).    No change maint rx needed          Each maintenance medication was reviewed in detail including emphasizing most importantly the difference between maintenance and prns and under what circumstances the prns are to be triggered using an action plan format where appropriate.  Total time for H and P, chart review, counseling, reviewing hfa/pulse ox  device(s) and generating customized AVS unique to this office visit / same day charting = 40 min with complex pt new to me        Late add 05/06/2023 > there is no contraindication to endoscopic procedures unless having an acute flare of ILD which was not the case at  most recent pulmonary ov so I cleared him for elective endoscopy

## 2023-04-24 NOTE — Patient Instructions (Addendum)
 Aciphex 20 mg Take  30-60 min before first meal of the day and Pepcid (famotidine)  20 mg after supper  until cough is gone for a week then ok to leave off   Make sure you check your oxygen saturation at your highest level of activity(NOT after you stop)  to be sure it stays over 90% and keep track of it at least once a week, more often if breathing getting worse, and let me know if losing ground. (Collect the dots to connect the dots approach)     Please remember to go to the  x-ray department  for your tests - we will call you with the results when they are available     Zpak   Prednisone 10 mg take  4 each am x 2 days,   2 each am x 2 days,  1 each am x 2 days and stop   Follow up as did

## 2023-05-05 ENCOUNTER — Ambulatory Visit (INDEPENDENT_AMBULATORY_CARE_PROVIDER_SITE_OTHER): Admitting: Gastroenterology

## 2023-05-05 ENCOUNTER — Telehealth (INDEPENDENT_AMBULATORY_CARE_PROVIDER_SITE_OTHER): Payer: Self-pay | Admitting: Gastroenterology

## 2023-05-05 ENCOUNTER — Encounter (INDEPENDENT_AMBULATORY_CARE_PROVIDER_SITE_OTHER): Payer: Self-pay | Admitting: Gastroenterology

## 2023-05-05 VITALS — BP 118/68 | HR 71 | Temp 97.7°F | Ht 71.0 in | Wt 246.4 lb

## 2023-05-05 DIAGNOSIS — R1013 Epigastric pain: Secondary | ICD-10-CM | POA: Diagnosis not present

## 2023-05-05 DIAGNOSIS — R634 Abnormal weight loss: Secondary | ICD-10-CM | POA: Diagnosis not present

## 2023-05-05 DIAGNOSIS — R1010 Upper abdominal pain, unspecified: Secondary | ICD-10-CM

## 2023-05-05 DIAGNOSIS — F1090 Alcohol use, unspecified, uncomplicated: Secondary | ICD-10-CM

## 2023-05-05 DIAGNOSIS — R6881 Early satiety: Secondary | ICD-10-CM | POA: Diagnosis not present

## 2023-05-05 MED ORDER — SUCRALFATE 1 G PO TABS: 1.0000 g | ORAL_TABLET | Freq: Three times a day (TID) | ORAL | 0 refills | Status: DC

## 2023-05-05 NOTE — H&P (View-Only) (Signed)
 Referring Provider: Elfredia Nevins, MD Primary Care Physician:  Elfredia Nevins, MD Primary GI Physician: new (Dr. Tasia Catchings)   Chief Complaint  Patient presents with   Abdominal Pain    Pt arrives for abdominal pain. Pt states he went to PCP in December for abdominal pain and was prescribed meds that did help. Bloating and gas have worsened. Also having pain at top of stomach. Pt has diarrhea all the time due to meds.    HPI:   Alexander Nunez is a 78 y.o. male with past medical history of anxiety, arthritis, diverticulitis, GERD, high cholesterol, HTN, IBS, osteoporosis, psoriasis, pulmonary fibrosis, sleep apnea, anal fissure   Patient presenting today as a new patient for: Abdominal pain and weight loss   CT A/P wo contrast: 12/17/22: Diverticulosis of descending and sigmoid colon without inflammation. Small amount of free fluid is noted in the pelvis of uncertain etiology. No other acute abnormality seen in the abdomen or pelvis.  Present: Patient reports epigastric pain that has been ongoing since November. He saw PCP and had CT and was given a medication (unsure what this was), states pain eased off some. Was supposed to see [place] in January but was sick and unable to come in. Over the last 2-3 weeks symptoms have been worse. He is having some heartburn. Takes pantoprazole 40mg  daily. States eating makes pain worse, appetite is decreased.  No nausea or vomiting. He is not taking any NSAIDs. Denies rectal bleeding or melena.  Reports approximately 40 pounds weight loss over the past year, he was not intending to lose weight.   He is having some diarrhea which is chronic for him, thinks it is related to some of the medications he is on but also has a lot of gas.   NSAID use: none, above  Social hx: no tobacco, drinks maybe one shot of liquor per day usually Fam hx: no CRC or liver disease  Last Colonoscopy:02/2017 internal hemorrhoids, diverticulosis  Last Endoscopy: 2005 gastritis    Recommendations:  Repeat Colonoscopy 5 years  Filed Weights   05/05/23 1518  Weight: 246 lb 6.4 oz (111.8 kg)     Past Medical History:  Diagnosis Date   Anxiety    Arthritis    back   Back pain, chronic    Colon polyp    Diverticulitis    GERD (gastroesophageal reflux disease)    Heart palpitations    High cholesterol    Hypertension    Irritable bowel syndrome    Osteoporosis    Psoriasis    Pulmonary fibrosis (HCC)    Pulmonary fibrosis (HCC)    Sleep apnea    c- pap    Past Surgical History:  Procedure Laterality Date   BACK SURGERY  2015   CHOLECYSTECTOMY N/A 10/12/2015   Procedure: LAPAROSCOPIC CHOLECYSTECTOMY;  Surgeon: Franky Macho, MD;  Location: [facility];  Service: General;  Laterality: N/A;   COLONOSCOPY     FOOT FRACTURE SURGERY     left foot   KNEE ARTHROSCOPY     left   TONSILLECTOMY     UPPER GASTROINTESTINAL ENDOSCOPY      Current Outpatient Medications  Medication Sig Dispense Refill   albuterol (VENTOLIN HFA) 108 (90 Base) MCG/ACT inhaler Inhale 1-2 puffs into the lungs every 4 (four) hours as needed for shortness of breath or wheezing. 6.7 g 5   ALPRAZolam (XANAX) 0.5 MG tablet Take 0.25-0.5 mg by mouth 3 (three) times daily as needed for sleep.  azaTHIOprine (IMURAN) 50 MG tablet TAKE 2 TABLETS BY MOUTH EVERY DAY 180 tablet 2   doxazosin (CARDURA) 4 MG tablet Take 4 mg by mouth at bedtime.       fluticasone (FLONASE) 50 MCG/ACT nasal spray Place 2 sprays into both nostrils daily. 16 g 0   losartan (COZAAR) 25 MG tablet Take 25 mg by mouth daily.     metoprolol tartrate (LOPRESSOR) 50 MG tablet Take 50 mg by mouth 2 (two) times daily.     Nintedanib (OFEV) 150 MG CAPS Take 1 capsule (150 mg total) by mouth 2 (two) times daily. 180 capsule 1   pravastatin (PRAVACHOL) 20 MG tablet Take 20 mg by mouth every evening.      No current facility-administered medications for this visit.    Allergies as of 05/05/2023 - Review Complete 05/05/2023   Allergen Reaction Noted   Cephalexin Other (See Comments) 03/07/2008   Contrast media [iodinated contrast media] Hives and Rash 09/17/2011    Social History   Socioeconomic History   Marital status: Married    Spouse name: Not on file   Number of children: 2   Years of education: Not on file   Highest education level: Not on file  Occupational History   Occupation: retired    Comment: Retired  Tobacco Use   Smoking status: Former    Current packs/day: 0.00    Average packs/day: 2.0 packs/day for 11.0 years (22.0 ttl pk-yrs)    Types: Cigarettes    Start date: 05/22/1962    Quit date: 05/21/1973    Years since quitting: 49.9   Smokeless tobacco: Never   Tobacco comments:    Quit 25 yrs now   Vaping Use   Vaping status: Never Used  Substance and Sexual Activity   Alcohol use: Yes    Alcohol/week: 14.0 standard drinks of alcohol    Types: 14 Shots of liquor per week    Comment: social   Drug use: No   Sexual activity: Yes    Partners: Female  Other Topics Concern   Not on file  Social History Narrative   Has dog named Advice worker   Social Drivers of Corporate investment banker Strain: Not on file  Food Insecurity: Not on file  Transportation Needs: Not on file  Physical Activity: Not on file  Stress: Not on file  Social Connections: Not on file    Review of systems General: negative for malaise, night sweats, fever, chills, +weight loss  Neck: Negative for lumps, goiter, pain and significant neck swelling Resp: Negative for cough, wheezing, dyspnea at rest CV: Negative for chest pain, leg swelling, palpitations, orthopnea GI: denies melena, hematochezia, nausea, vomiting, constipation, dysphagia, odynophagia. +diarrhea +upper abdominal pain +early satiety +decreased appetite +weight loss  MSK: Negative for joint pain or swelling, back pain, and muscle pain. Derm: Negative for itching or rash Psych: Denies depression, anxiety, memory loss, confusion. No  homicidal or suicidal ideation.  Heme: Negative for prolonged bleeding, bruising easily, and swollen nodes. Endocrine: Negative for cold or heat intolerance, polyuria, polydipsia and goiter. Neuro: negative for tremor, gait imbalance, syncope and seizures. The remainder of the review of systems is noncontributory.  Physical Exam: BP 118/68   Pulse 71   Temp 97.7 F (36.5 C)   Ht 5\' 11"  (1.803 m)   Wt 246 lb 6.4 oz (111.8 kg)   SpO2 94%   BMI 34.37 kg/m  General:   Alert and oriented. No distress noted. Pleasant and cooperative.  Head:  Normocephalic and atraumatic. Eyes:  Conjuctiva clear without scleral icterus. Mouth:  Oral mucosa pink and moist. Good dentition. No lesions. Heart: Normal rate and rhythm, s1 and s2 heart sounds present.  Lungs: Clear lung sounds in all lobes. Respirations equal and unlabored. Abdomen:  +BS, soft, and non-distended. Mild TTP of LUQ and Mid upper abdomen. No rebound or guarding. No HSM or masses noted. Derm: No palmar erythema or jaundice Msk:  Symmetrical without gross deformities. Normal posture. Extremities:  Without edema. Neurologic:  Alert and  oriented x4 Psych:  Alert and cooperative. Normal mood and affect.  Invalid input(s): "6 MONTHS"   ASSESSMENT: Alexander Nunez is a 78 y.o. male presenting today for upper abdominal pain and weight loss   Patient with epigastric pain, early satiety off and on since November, with approximately 40 pounds of weight loss over the past year. No rectal bleeding or melena. He is not taking NSAIDs. Notably has about 1 ETOH drink per night but recently has not consumed ETOH due to abdominal discomfort. CT A/P wo contrast in November without acute findings. Would recommend proceeding with EGD for further evaluation as I cannot rule out gastritis, PUD, duodenitis, H pylori, malignancy.   Last colonoscopy was in 2019, patient was recommended to have repeat last year though was lost to follow up for this. We  discussed the possibility of updating colonoscopy at time of EGD, though given upper abdominal symptoms, may be difficult to adequately prep, therefore, I think it may be best to pursue EGD and discuss colonoscopy at follow up, especially if no findings to explain weight loss are found in UGI tract, would certainly recommend updating colonoscopy.   PLAN:  -continue protonix 40mg  daily -schedule EGD-clearance from pulmonology ASA III -carafate 1g QID -discuss colonoscopy after EGD  All questions were answered, patient verbalized understanding and is in agreement with plan as outlined above.   Follow Up: 3 months   Ajene Carchi L. Jeanmarie Hubert, MSN, APRN, AGNP-C Adult-Gerontology Nurse Practitioner [facility]

## 2023-05-05 NOTE — Telephone Encounter (Signed)
    05/05/23  [name] 06-04-1945  What type of surgery is being performed? EGD  When is surgery scheduled? 05/07/23  Requesting pulmonary clearance   Name of physician performing surgery?  Dr. Starleen Arms [facility] at [facility] Phone: [phone] Fax: [phone]  Anethesia type (none, local, MAC, general)? Choice

## 2023-05-05 NOTE — Patient Instructions (Signed)
 We will get you scheduled for upper endoscopy Please continue with protonix 40mg  daily I am sending carafate 1g to take before meals and at bedtime, please dissolve 1 tablet in 30ml of water, dissolve and drink Continue to avoid alcohol We will discuss updating colonoscopy after EGD  Follow up 3 months  It was a pleasure to see you today. I want to create trusting relationships with patients and provide genuine, compassionate, and quality care. I truly value your feedback! please be on the lookout for a survey regarding your visit with me today. I appreciate your input about our visit and your time in completing this!    Shariyah Eland L. Jeanmarie Hubert, MSN, APRN, AGNP-C Adult-Gerontology Nurse Practitioner [facility] at [facility]

## 2023-05-05 NOTE — Progress Notes (Unsigned)
 Referring Provider: Elfredia Nevins, MD Primary Care Physician:  Elfredia Nevins, MD Primary GI Physician: new (Dr. Tasia Catchings)   Chief Complaint  Patient presents with   Abdominal Pain    Pt arrives for abdominal pain. Pt states he went to PCP in December for abdominal pain and was prescribed meds that did help. Bloating and gas have worsened. Also having pain at top of stomach. Pt has diarrhea all the time due to meds.    HPI:   Alexander Nunez is a 78 y.o. male with past medical history of anxiety, arthritis, diverticulitis, GERD, high cholesterol, HTN, IBS, osteoporosis, psoriasis, pulmonary fibrosis, sleep apnea, anal fissure   Patient presenting today for:  Abdominal pain   CT A/P wo contrast: 12/17/22: Diverticulosis of descending and sigmoid colon without inflammation. Small amount of free fluid is noted in the pelvis of uncertain etiology. No other acute abnormality seen in the abdomen or pelvis.  Present: Patient reports epigastric pain that has been ongoing since November. He saw PCP and had CT and was given a medication, states pain eased off some. Was supposed to see [place] in January but was sick and unable to come in. Over the last 2-3 weeks symptoms have been worse. He is having some heartburn. Takes pantoprazole 40mg  daily. States eating makes pain worse, appetite is decreased.  He is not taking any NSAIDs. Denies rectal bleeding or melena.  Reports approximately 40 pounds weight loss over the past year, he was not intending to lose weight.   He is having some diarrhea, thinks it is related to some of the medications he is on but also has a lot of gas.    NSAID use: none, above  Social hx: no tobacco, drinks maybe one shot of liquor per day usually Fam hx: no CRC or liver disease  Last Colonoscopy:02/2017 internal hemorrhoids, diverticulosis  Last Endoscopy: 2005 gastritis   Recommendations:  Repeat Colonoscopy 5 years  Filed Weights   05/05/23 1518  Weight: 246 lb 6.4  oz (111.8 kg)     Past Medical History:  Diagnosis Date   Anxiety    Arthritis    back   Back pain, chronic    Colon polyp    Diverticulitis    GERD (gastroesophageal reflux disease)    Heart palpitations    High cholesterol    Hypertension    Irritable bowel syndrome    Osteoporosis    Psoriasis    Pulmonary fibrosis (HCC)    Pulmonary fibrosis (HCC)    Sleep apnea    c- pap    Past Surgical History:  Procedure Laterality Date   BACK SURGERY  2015   CHOLECYSTECTOMY N/A 10/12/2015   Procedure: LAPAROSCOPIC CHOLECYSTECTOMY;  Surgeon: Franky Macho, MD;  Location: [facility];  Service: General;  Laterality: N/A;   COLONOSCOPY     FOOT FRACTURE SURGERY     left foot   KNEE ARTHROSCOPY     left   TONSILLECTOMY     UPPER GASTROINTESTINAL ENDOSCOPY      Current Outpatient Medications  Medication Sig Dispense Refill   albuterol (VENTOLIN HFA) 108 (90 Base) MCG/ACT inhaler Inhale 1-2 puffs into the lungs every 4 (four) hours as needed for shortness of breath or wheezing. 6.7 g 5   ALPRAZolam (XANAX) 0.5 MG tablet Take 0.25-0.5 mg by mouth 3 (three) times daily as needed for sleep.     azaTHIOprine (IMURAN) 50 MG tablet TAKE 2 TABLETS BY MOUTH EVERY DAY 180 tablet 2  doxazosin (CARDURA) 4 MG tablet Take 4 mg by mouth at bedtime.       fluticasone (FLONASE) 50 MCG/ACT nasal spray Place 2 sprays into both nostrils daily. 16 g 0   losartan (COZAAR) 25 MG tablet Take 25 mg by mouth daily.     metoprolol tartrate (LOPRESSOR) 50 MG tablet Take 50 mg by mouth 2 (two) times daily.     Nintedanib (OFEV) 150 MG CAPS Take 1 capsule (150 mg total) by mouth 2 (two) times daily. 180 capsule 1   pravastatin (PRAVACHOL) 20 MG tablet Take 20 mg by mouth every evening.      No current facility-administered medications for this visit.    Allergies as of 05/05/2023 - Review Complete 05/05/2023  Allergen Reaction Noted   Cephalexin Other (See Comments) 03/07/2008   Contrast media [iodinated  contrast media] Hives and Rash 09/17/2011    Social History   Socioeconomic History   Marital status: Married    Spouse name: Not on file   Number of children: 2   Years of education: Not on file   Highest education level: Not on file  Occupational History   Occupation: retired    Comment: Retired  Tobacco Use   Smoking status: Former    Current packs/day: 0.00    Average packs/day: 2.0 packs/day for 11.0 years (22.0 ttl pk-yrs)    Types: Cigarettes    Start date: 05/22/1962    Quit date: 05/21/1973    Years since quitting: 49.9   Smokeless tobacco: Never   Tobacco comments:    Quit 25 yrs now   Vaping Use   Vaping status: Never Used  Substance and Sexual Activity   Alcohol use: Yes    Alcohol/week: 14.0 standard drinks of alcohol    Types: 14 Shots of liquor per week    Comment: social   Drug use: No   Sexual activity: Yes    Partners: Female  Other Topics Concern   Not on file  Social History Narrative   Has dog named Advice worker   Social Drivers of Corporate investment banker Strain: Not on file  Food Insecurity: Not on file  Transportation Needs: Not on file  Physical Activity: Not on file  Stress: Not on file  Social Connections: Not on file    Review of systems General: negative for malaise, night sweats, fever, chills, weight los Neck: Negative for lumps, goiter, pain and significant neck swelling Resp: Negative for cough, wheezing, dyspnea at rest CV: Negative for chest pain, leg swelling, palpitations, orthopnea GI: denies melena, hematochezia, nausea, vomiting, diarrhea, constipation, dysphagia, odyonophagia, early satiety or unintentional weight loss.  MSK: Negative for joint pain or swelling, back pain, and muscle pain. Derm: Negative for itching or rash Psych: Denies depression, anxiety, memory loss, confusion. No homicidal or suicidal ideation.  Heme: Negative for prolonged bleeding, bruising easily, and swollen nodes. Endocrine: Negative for  cold or heat intolerance, polyuria, polydipsia and goiter. Neuro: negative for tremor, gait imbalance, syncope and seizures. The remainder of the review of systems is noncontributory.  Physical Exam: BP 118/68   Pulse 71   Temp 97.7 F (36.5 C)   Ht 5\' 11"  (1.803 m)   Wt 246 lb 6.4 oz (111.8 kg)   SpO2 94%   BMI 34.37 kg/m  General:   Alert and oriented. No distress noted. Pleasant and cooperative.  Head:  Normocephalic and atraumatic. Eyes:  Conjuctiva clear without scleral icterus. Mouth:  Oral mucosa pink and moist. Good  dentition. No lesions. Heart: Normal rate and rhythm, s1 and s2 heart sounds present.  Lungs: Clear lung sounds in all lobes. Respirations equal and unlabored. Abdomen:  +BS, soft, non-tender and non-distended. No rebound or guarding. No HSM or masses noted. Derm: No palmar erythema or jaundice Msk:  Symmetrical without gross deformities. Normal posture. Extremities:  Without edema. Neurologic:  Alert and  oriented x4 Psych:  Alert and cooperative. Normal mood and affect.  Invalid input(s): "6 MONTHS"   ASSESSMENT: Alexander Nunez is a 78 y.o. male presenting today    PLAN:  -continue 40mg  daily -scheduled EGD-clearance from pulmonology ASA III -carafate 1g QID -discuss colonoscopy after EGD  All questions were answered, patient verbalized understanding and is in agreement with plan as outlined above.    Follow Up: 3 months  Davon Folta L. Jeanmarie Hubert, MSN, APRN, AGNP-C Adult-Gerontology Nurse Practitioner [facility]

## 2023-05-06 ENCOUNTER — Encounter (HOSPITAL_COMMUNITY)
Admission: RE | Admit: 2023-05-06 | Discharge: 2023-05-06 | Disposition: A | Discharge status: Home / Self Care | Source: Ambulatory Visit | Attending: Gastroenterology | Admitting: Gastroenterology

## 2023-05-06 ENCOUNTER — Encounter (HOSPITAL_COMMUNITY): Payer: Self-pay

## 2023-05-06 ENCOUNTER — Telehealth: Payer: Self-pay | Admitting: Internal Medicine

## 2023-05-06 DIAGNOSIS — R634 Abnormal weight loss: Secondary | ICD-10-CM | POA: Insufficient documentation

## 2023-05-06 NOTE — Telephone Encounter (Signed)
 Clearance sent via fax to Pulmonary office also

## 2023-05-06 NOTE — Patient Instructions (Addendum)
 [name]  05/06/2023     @PREFPERIOPPHARMACY @   Your procedure is scheduled on 05/07/2023.   Report to [facility] at 12 PM.   Call this number if you have problems the morning of surgery:  [phone]  If you experience any cold or flu symptoms such as cough, fever, chills, shortness of breath, etc. between now and your scheduled surgery, please notify us at the above number.   Remember:     Please follow the diet and prep instructions given to you by Dr Marcelino Freestone office.    You may drink clear liquids until 10 AM .  Clear liquids allowed are:                    Water, Juice (No red color; non-citric and without pulp; diabetics please choose diet or no sugar options), Carbonated beverages (diabetics please choose diet or no sugar options), Clear Tea (No creamer, milk, or cream, including half & half and powdered creamer), and Clear Sports drink (No red color; diabetics please choose diet or no sugar options)    Take these medicines the morning of surgery with A SIP OF WATER : Alprazolam Metoprolol Imuran Cardura and OFEV(Nintedanib)    Do not wear jewelry, make-up or nail polish, including gel polish,  artificial nails, or any other type of covering on natural nails (fingers and  toes).  Do not wear lotions, powders, or perfumes, or deodorant.  Do not shave 48 hours prior to surgery.  Men may shave face and neck.  Do not bring valuables to the hospital.  [facility] is not responsible for any belongings or valuables.  Contacts, dentures or bridgework may not be worn into surgery.  Leave your suitcase in the car.  After surgery it may be brought to your room.  For patients admitted to the hospital, discharge time will be determined by your treatment team.  Patients discharged the day of surgery will not be allowed to drive home.   Name and phone number of your driver:   family Special instructions:  N/A  Please read over the following fact sheets that you were  given. Care and Recovery After Surgery  Upper Endoscopy, Adult Upper endoscopy is a procedure to look inside the upper GI (gastrointestinal) tract. The upper GI tract is made up of: The esophagus. This is the part of the body that moves food from your mouth to your stomach. The stomach. The duodenum. This is the first part of your small intestine. This procedure is also called esophagogastroduodenoscopy (EGD) or gastroscopy. In this procedure, your health care provider passes a thin, flexible tube (endoscope) through your mouth and down your esophagus into your stomach and into your duodenum. A small camera is attached to the end of the tube. Images from the camera appear on a monitor in the exam room. During this procedure, your health care provider may also remove a small piece of tissue to be sent to a lab and examined under a microscope (biopsy). Your health care provider may do an upper endoscopy to diagnose cancers of the upper GI tract. You may also have this procedure to find the cause of other conditions, such as: Stomach pain. Heartburn. Pain or problems when swallowing. Nausea and vomiting. Stomach bleeding. Stomach ulcers. Tell a health care provider about: Any allergies you have. All medicines you are taking, including vitamins, herbs, eye drops, creams, and over-the-counter medicines. Any problems you or family members have had with anesthetic  medicines. Any bleeding problems you have. Any surgeries you have had. Any medical conditions you have. Whether you are pregnant or may be pregnant. What are the risks? Your healthcare provider will talk with you about risks. These may include: Infection. Bleeding. Allergic reactions to medicines. A tear or hole (perforation) in the esophagus, stomach, or duodenum. What happens before the procedure? When to stop eating and drinking Follow instructions from your health care provider about what you may eat and drink. These may  include: 8 hours before your procedure Stop eating most foods. Do not eat meat, fried foods, or fatty foods. Eat only light foods, such as toast or crackers. All liquids are okay except energy drinks and alcohol. 6 hours before your procedure Stop eating. Drink only clear liquids, such as water, clear fruit juice, black coffee, plain tea, and sports drinks. Do not drink energy drinks or alcohol. 2 hours before your procedure Stop drinking all liquids. You may be allowed to take medicines with small sips of water. If you do not follow your health care provider's instructions, your procedure may be delayed or canceled. Medicines Ask your health care provider about: Changing or stopping your regular medicines. This is especially important if you are taking diabetes medicines or blood thinners. Taking medicines such as aspirin and ibuprofen. These medicines can thin your blood. Do not take these medicines unless your health care provider tells you to take them. Taking over-the-counter medicines, vitamins, herbs, and supplements. General instructions If you will be going home right after the procedure, plan to have a responsible adult: Take you home from the hospital or clinic. You will not be allowed to drive. Care for you for the time you are told. What happens during the procedure?  An IV will be inserted into one of your veins. You may be given one or more of the following: A medicine to help you relax (sedative). A medicine to numb the throat (local anesthetic). You will lie on your left side on an exam table. Your health care provider will pass the endoscope through your mouth and down your esophagus. Your health care provider will use the scope to check the inside of your esophagus, stomach, and duodenum. Biopsies may be taken. The endoscope will be removed. The procedure may vary among health care providers and hospitals. What happens after the procedure? Your blood pressure,  heart rate, breathing rate, and blood oxygen level will be monitored until you leave the hospital or clinic. When your throat is no longer numb, you may be given some fluids to drink. If you were given a sedative during the procedure, it can affect you for several hours. Do not drive or operate machinery until your health care provider says that it is safe. It is up to you to get the results of your procedure. Ask your health care provider, or the department that is doing the procedure, when your results will be ready. Contact a health care provider if you: Have a sore throat that lasts longer than 1 day. Have a fever. Get help right away if you: Vomit blood or your vomit looks like coffee grounds. Have bloody, black, or tarry stools. Have a very bad sore throat or you cannot swallow. Have difficulty breathing or very bad pain in your chest or abdomen. These symptoms may be an emergency. Get help right away. Call 911. Do not wait to see if the symptoms will go away. Do not drive yourself to the hospital. Summary Upper endoscopy is  a procedure to look inside the upper GI tract. During the procedure, an IV will be inserted into one of your veins. You may be given a medicine to help you relax. The endoscope will be passed through your mouth and down your esophagus. Follow instructions from your health care provider about what you can eat and drink. This information is not intended to replace advice given to you by your health care provider. Make sure you discuss any questions you have with your health care provider. Document Revised: 05/01/2021 Document Reviewed: 05/01/2021 Elsevier Patient Education  2024 [employer]  Monitored Anesthesia Care Anesthesia refers to the techniques, procedures, and medicines that help a person stay safe and comfortable during surgery. Monitored anesthesia care, or sedation, is one type of anesthesia. You may have sedation if you do not need to be asleep for  your procedure. Procedures that use sedation may include: Surgery to remove cataracts from your eyes. A dental procedure. A biopsy. This is when a tissue sample is removed and looked at under a microscope. You will be watched closely during your procedure. Your level of sedation or type of anesthesia may be changed to fit your needs. Tell a health care provider about: Any allergies you have. All medicines you are taking, including vitamins, herbs, eye drops, creams, and over-the-counter medicines. Any problems you or family members have had with anesthesia. Any bleeding problems you have. Any surgeries you have had. Any medical conditions or illnesses you have. This includes sleep apnea, cough, fever, or the flu. Whether you are pregnant or may be pregnant. Whether you use cigarettes, alcohol, or drugs. Any use of steroids, whether by mouth or as a cream. What are the risks? Your health care provider will talk with you about risks. These may include: Getting too much medicine (oversedation). Nausea. Allergic reactions to medicines. Trouble breathing. If this happens, a breathing tube may be used to help you breathe. It will be removed when you are awake and breathing on your own. Heart trouble. Lung trouble. Confusion that gets better with time (emergence delirium). What happens before the procedure? When to stop eating and drinking Follow instructions from your health care provider about what you may eat and drink. These may include: 8 hours before your procedure Stop eating most foods. Do not eat meat, fried foods, or fatty foods. Eat only light foods, such as toast or crackers. All liquids are okay except energy drinks and alcohol. 6 hours before your procedure Stop eating. Drink only clear liquids, such as water, clear fruit juice, black coffee, plain tea, and sports drinks. Do not drink energy drinks or alcohol. 2 hours before your procedure Stop drinking all liquids. You  may be allowed to take medicines with small sips of water. If you do not follow your health care provider's instructions, your procedure may be delayed or canceled. Medicines Ask your health care provider about: Changing or stopping your regular medicines. These include any diabetes medicines or blood thinners you take. Taking medicines such as aspirin and ibuprofen. These medicines can thin your blood. Do not take them unless your health care provider tells you to. Taking over-the-counter medicines, vitamins, herbs, and supplements. Testing You may have an exam or testing. You may have a blood or urine sample taken. General instructions Do not use any products that contain nicotine or tobacco for at least 4 weeks before the procedure. These products include cigarettes, chewing tobacco, and vaping devices, such as e-cigarettes. If you need help quitting, ask  your health care provider. If you will be going home right after the procedure, plan to have a responsible adult: Take you home from the hospital or clinic. You will not be allowed to drive. Care for you for the time you are told. What happens during the procedure?  Your blood pressure, heart rate, breathing, level of pain, and blood oxygen level will be monitored. An IV will be inserted into one of your veins. You may be given: A sedative. This helps you relax. Anesthesia. This will: Numb certain areas of your body. Make you fall asleep for surgery. You will be given medicines as needed to keep you comfortable. The more medicine you are given, the deeper your level of sedation will be. Your level of sedation may be changed to fit your needs. There are three levels of sedation: Mild sedation. At this level, you may feel awake and relaxed. You will be able to follow directions. Moderate sedation. At this level, you will be sleepy. You may not remember the procedure. Deep sedation. At this level, you will be asleep. You will not remember  the procedure. How you get the medicines will depend on your age and the procedure. They may be given as: A pill. This may be taken by mouth (orally) or inserted into the rectum. An injection. This may be into a vein or muscle. A spray through the nose. After your procedure is over, the medicine will be stopped. The procedure may vary among health care providers and hospitals. What happens after the procedure? Your blood pressure, heart rate, breathing rate, and blood oxygen level will be monitored until you leave the hospital or clinic. You may feel sleepy, clumsy, or nauseous. You may not remember what happened during or after the procedure. Sedation can affect you for several hours. Do not drive or use machinery until your health care provider says that it is safe. This information is not intended to replace advice given to you by your health care provider. Make sure you discuss any questions you have with your health care provider. Document Revised: 06/17/2021 Document Reviewed: 06/17/2021 Elsevier Patient Education  2024 [employer].

## 2023-05-06 NOTE — Telephone Encounter (Signed)
 Fax received from Dr. Tasia Catchings with GI to perform a Endoscopy under MAC on patient.  Patient needs surgery clearance. Procedure is tomorrow 05/07/23. Patient was seen on 04/24/23. Office protocol is a risk assessment can be sent to surgeon if patient has been seen in 60 days or less.   Sending to Dr. Sherene Sires for risk assessment or recommendations if patient needs to be seen in office prior to surgical procedure.

## 2023-05-06 NOTE — Pre-Procedure Instructions (Signed)
   RE: pulmonary clerance Received: Today Ahmed, [name], MD  Armstead Peaks, CMA; Elsie Amis, RN; Marlowe Shores, LPN; Elinor Dodge, LPN Hello I got in touch with Dr .Sherene Sires    He will put an addendum to his  last note  "yes he should be fine for endoscopy  - there is no red line for mild pulmonary fibrosis related to non - thoracic procedures"  We should be good for tomorrow       Previous Messages    ----- Message ----- From: Armstead Peaks, CMA Sent: 05/06/2023   2:46 PM EDT To: Elinor Dodge, LPN; Elsie Amis, RN; * Subject: RE: pulmonary clerance                        Kenney Houseman is gone for the day.I sent to the note to Dr. Sherene Sires in epic to see if he will respond. ----- Message ----- From: Elsie Amis, RN Sent: 05/06/2023   2:43 PM EDT To: Elinor Dodge, LPN; Armstead Peaks, CMA; * Subject: pulmonary clerance                            Good afternoon! I know that Mr [name] was just added on late yesterday for tomorrow and that pulmonary clearance was sent at 1600 yesterday. Have you heard back from this yet? Anesthesia is wanting that note. Thank you!

## 2023-05-07 ENCOUNTER — Encounter (HOSPITAL_COMMUNITY)
Admission: RE | Disposition: A | Discharge status: Home / Self Care | Payer: Self-pay | Source: Home / Self Care | Attending: Gastroenterology

## 2023-05-07 ENCOUNTER — Encounter: Payer: Self-pay | Admitting: Pulmonary Disease

## 2023-05-07 ENCOUNTER — Ambulatory Visit (HOSPITAL_COMMUNITY): Admitting: Certified Registered"

## 2023-05-07 ENCOUNTER — Ambulatory Visit (HOSPITAL_COMMUNITY)
Admission: RE | Admit: 2023-05-07 | Discharge: 2023-05-07 | Disposition: A | Discharge status: Home / Self Care | Attending: Gastroenterology | Admitting: Gastroenterology

## 2023-05-07 DIAGNOSIS — R634 Abnormal weight loss: Secondary | ICD-10-CM | POA: Diagnosis not present

## 2023-05-07 DIAGNOSIS — Z79899 Other long term (current) drug therapy: Secondary | ICD-10-CM | POA: Insufficient documentation

## 2023-05-07 DIAGNOSIS — K219 Gastro-esophageal reflux disease without esophagitis: Secondary | ICD-10-CM | POA: Diagnosis not present

## 2023-05-07 DIAGNOSIS — M1990 Unspecified osteoarthritis, unspecified site: Secondary | ICD-10-CM | POA: Diagnosis not present

## 2023-05-07 DIAGNOSIS — K2970 Gastritis, unspecified, without bleeding: Secondary | ICD-10-CM

## 2023-05-07 DIAGNOSIS — M810 Age-related osteoporosis without current pathological fracture: Secondary | ICD-10-CM | POA: Insufficient documentation

## 2023-05-07 DIAGNOSIS — J8410 Pulmonary fibrosis, unspecified: Secondary | ICD-10-CM | POA: Diagnosis not present

## 2023-05-07 DIAGNOSIS — Z87891 Personal history of nicotine dependence: Secondary | ICD-10-CM | POA: Insufficient documentation

## 2023-05-07 DIAGNOSIS — E7800 Pure hypercholesterolemia, unspecified: Secondary | ICD-10-CM | POA: Diagnosis not present

## 2023-05-07 DIAGNOSIS — L409 Psoriasis, unspecified: Secondary | ICD-10-CM | POA: Diagnosis not present

## 2023-05-07 DIAGNOSIS — K602 Anal fissure, unspecified: Secondary | ICD-10-CM | POA: Insufficient documentation

## 2023-05-07 DIAGNOSIS — Z79631 Long term (current) use of antimetabolite agent: Secondary | ICD-10-CM | POA: Diagnosis not present

## 2023-05-07 DIAGNOSIS — G4730 Sleep apnea, unspecified: Secondary | ICD-10-CM | POA: Insufficient documentation

## 2023-05-07 DIAGNOSIS — I10 Essential (primary) hypertension: Secondary | ICD-10-CM

## 2023-05-07 DIAGNOSIS — Z860100 Personal history of colon polyps, unspecified: Secondary | ICD-10-CM | POA: Insufficient documentation

## 2023-05-07 DIAGNOSIS — Z6834 Body mass index (BMI) 34.0-34.9, adult: Secondary | ICD-10-CM | POA: Diagnosis not present

## 2023-05-07 DIAGNOSIS — Z79624 Long term (current) use of inhibitors of nucleotide synthesis: Secondary | ICD-10-CM | POA: Insufficient documentation

## 2023-05-07 DIAGNOSIS — F419 Anxiety disorder, unspecified: Secondary | ICD-10-CM | POA: Insufficient documentation

## 2023-05-07 DIAGNOSIS — K648 Other hemorrhoids: Secondary | ICD-10-CM | POA: Diagnosis not present

## 2023-05-07 DIAGNOSIS — K580 Irritable bowel syndrome with diarrhea: Secondary | ICD-10-CM | POA: Diagnosis not present

## 2023-05-07 DIAGNOSIS — R1013 Epigastric pain: Secondary | ICD-10-CM | POA: Diagnosis not present

## 2023-05-07 HISTORY — PX: ESOPHAGOGASTRODUODENOSCOPY: SHX5428

## 2023-05-07 SURGERY — EGD (ESOPHAGOGASTRODUODENOSCOPY)
Anesthesia: General

## 2023-05-07 MED ORDER — PROPOFOL 500 MG/50ML IV EMUL
INTRAVENOUS | Status: AC
  Filled 2023-05-07: qty 50

## 2023-05-07 MED ORDER — LIDOCAINE HCL (PF) 2 % IJ SOLN
INTRAMUSCULAR | Status: AC
  Filled 2023-05-07: qty 5

## 2023-05-07 MED ORDER — LIDOCAINE HCL (PF) 2 % IJ SOLN
INTRAMUSCULAR | Status: DC | PRN
  Administered 2023-05-07: 60 mg via INTRADERMAL

## 2023-05-07 MED ORDER — PROPOFOL 500 MG/50ML IV EMUL
INTRAVENOUS | Status: DC | PRN
  Administered 2023-05-07: 125 ug/kg/min via INTRAVENOUS

## 2023-05-07 MED ORDER — PROPOFOL 10 MG/ML IV BOLUS
INTRAVENOUS | Status: DC | PRN
  Administered 2023-05-07: 50 mg via INTRAVENOUS

## 2023-05-07 MED ORDER — PANTOPRAZOLE SODIUM 40 MG PO TBEC: 40.0000 mg | DELAYED_RELEASE_TABLET | Freq: Every day | ORAL | 1 refills | Status: DC

## 2023-05-07 NOTE — Interval H&P Note (Signed)
 History and Physical Interval Note:  05/07/2023 12:36 PM  Alexander Nunez  has presented today for surgery, with the diagnosis of EPIGASTRIC PAIN, WEIGHT LOSS.  The various methods of treatment have been discussed with the patient and family. After consideration of risks, benefits and other options for treatment, the patient has consented to  Procedure(s) with comments: EGD (ESOPHAGOGASTRODUODENOSCOPY) (N/A) - 2:00PM;ASA 3 as a surgical intervention.  The patient's history has been reviewed, patient examined, no change in status, stable for surgery.  I have reviewed the patient's chart and labs.  Questions were answered to the patient's satisfaction.     [name] Daaron Dimarco

## 2023-05-07 NOTE — Discharge Instructions (Signed)
  Discharge instructions Please read the instructions outlined below and refer to this sheet in the next few weeks. These discharge instructions provide you with general information on caring for yourself after you leave the hospital. Your doctor may also give you specific instructions. While your treatment has been planned according to the most current medical practices available, unavoidable complications occasionally occur. If you have any problems or questions after discharge, please call your doctor. ACTIVITY You may resume your regular activity but move at a slower pace for the next 24 hours.  Take frequent rest periods for the next 24 hours.  Walking will help expel (get rid of) the air and reduce the bloated feeling in your abdomen.  No driving for 24 hours (because of the anesthesia (medicine) used during the test).  You may shower.  Do not sign any important legal documents or operate any machinery for 24 hours (because of the anesthesia used during the test).  NUTRITION Drink plenty of fluids.  You may resume your normal diet.  Begin with a light meal and progress to your normal diet.  Avoid alcoholic beverages for 24 hours or as instructed by your caregiver.  MEDICATIONS You may resume your normal medications unless your caregiver tells you otherwise.  WHAT YOU CAN EXPECT TODAY You may experience abdominal discomfort such as a feeling of fullness or "gas" pains.  FOLLOW-UP Your doctor will discuss the results of your test with you.  SEEK IMMEDIATE MEDICAL ATTENTION IF ANY OF THE FOLLOWING OCCUR: Excessive nausea (feeling sick to your stomach) and/or vomiting.  Severe abdominal pain and distention (swelling).  Trouble swallowing.  Temperature over 101 F (37.8 C).  Rectal bleeding or vomiting of blood.    1)Metamucil daily 2)Low FODMAP diet 3)BEANO before food 4)Protonix 40mg  , 30 min before breakfast    I hope you have a great rest of your week!   Vista Lawman  , M.D.. Gastroenterology and Hepatology [facility]

## 2023-05-07 NOTE — Anesthesia Postprocedure Evaluation (Signed)
 Anesthesia Post Note  Patient: Alexander Nunez  Procedure(s) Performed: EGD (ESOPHAGOGASTRODUODENOSCOPY)  Patient location during evaluation: PACU Anesthesia Type: General Level of consciousness: awake and alert Pain management: pain level controlled Vital Signs Assessment: post-procedure vital signs reviewed and stable Respiratory status: spontaneous breathing, nonlabored ventilation, respiratory function stable and patient connected to nasal cannula oxygen Cardiovascular status: blood pressure returned to baseline and stable Postop Assessment: no apparent nausea or vomiting Anesthetic complications: no   There were no known notable events for this encounter.   Last Vitals:  Vitals:   05/07/23 1238 05/07/23 1316  BP: 103/67 (!) 117/98  Pulse: 68 60  Resp: 20 (!) 95  Temp: 36.7 C 36.4 C  SpO2: 96% 96%    Last Pain:  Vitals:   05/07/23 1316  TempSrc: Oral  PainSc: 0-No pain                 Yasmyn Bellisario L Rodderick Holtzer

## 2023-05-07 NOTE — Op Note (Signed)
 [facility] Patient Name: Alexander Nunez Procedure Date: 05/07/2023 12:38 PM MRN: [id number] Date of Birth: [birth year] Attending MD: Sanjuan Dame , MD, [id number] CSN: [id number] Age: 78 Admit Type: Outpatient Procedure:                Upper GI endoscopy Indications:              Dyspepsia, Weight loss Providers:                Sanjuan Dame, MD, Francoise Ceo RN, RN, Elinor Parkinson Referring MD:              Medicines:                Monitored Anesthesia Care Complications:            No immediate complications. Estimated Blood Loss:     Estimated blood loss: none. Procedure:                Pre-Anesthesia Assessment:                           - Prior to the procedure, a History and Physical                            was performed, and patient medications and                            allergies were reviewed. The patient's tolerance of                            previous anesthesia was also reviewed. The risks                            and benefits of the procedure and the sedation                            options and risks were discussed with the patient.                            All questions were answered, and informed consent                            was obtained. Prior Anticoagulants: The patient has                            taken no anticoagulant or antiplatelet agents. ASA                            Grade Assessment: III - A patient with severe                            systemic disease. After reviewing the risks and  benefits, the patient was deemed in satisfactory                            condition to undergo the procedure.                           After obtaining informed consent, the endoscope was                            passed under direct vision. Throughout the                            procedure, the patient's blood pressure, pulse, and                            oxygen saturations were  monitored continuously. The                            [id number]-H190 ([id number]) scope was introduced through the                            mouth, and advanced to the second part of duodenum.                            The upper GI endoscopy was accomplished without                            difficulty. The patient tolerated the procedure                            well. Scope In: 1:06:53 PM Scope Out: 1:10:35 PM Total Procedure Duration: 0 hours 3 minutes 42 seconds  Findings:      The examined esophagus was normal.      Mild inflammation characterized by nodularity was found in the entire       examined stomach. Biopsies were taken with a cold forceps for histology.      The duodenal bulb and second portion of the duodenum were normal. Impression:               - Normal esophagus.                           - Gastritis. Biopsied.                           - Normal duodenal bulb and second portion of the                            duodenum. Moderate Sedation:      Per Anesthesia Care Recommendation:           - Patient has a contact number available for                            emergencies. The signs and symptoms of potential  delayed complications were discussed with the                            patient. Return to normal activities tomorrow.                            Written discharge instructions were provided to the                            patient.                           - Resume previous diet.                           - Continue present medications.                           - Await pathology results.                           -Start BEANO before food                           -Metamucil Daily                           -Low FODMAP diet                           - If symptoms continue would benefit from SIBO                            testing                           -Colonoscopy for unintentional weight loss Procedure Code(s):        ---  Professional ---                           [phone], Esophagogastroduodenoscopy, flexible,                            transoral; with biopsy, single or multiple Diagnosis Code(s):        --- Professional ---                           K29.70, Gastritis, unspecified, without bleeding                           R10.13, Epigastric pain                           R63.4, Abnormal weight loss CPT copyright 2022 American Medical Association. All rights reserved. The codes documented in this report are preliminary and upon coder review may  be revised to meet current compliance requirements. Sanjuan Dame, MD Sanjuan Dame, MD 05/07/2023 1:16:28 PM This report has been signed electronically. Number of Addenda: 0

## 2023-05-07 NOTE — Transfer of Care (Signed)
 Immediate Anesthesia Transfer of Care Note  Patient: Alexander Nunez  Procedure(s) Performed: EGD (ESOPHAGOGASTRODUODENOSCOPY)  Patient Location: Short Stay  Anesthesia Type:General  Level of Consciousness: awake, alert , oriented, and patient cooperative  Airway & Oxygen Therapy: Patient Spontanous Breathing and Patient connected to nasal cannula oxygen  Post-op Assessment: Report given to RN, Post -op Vital signs reviewed and stable, and Patient moving all extremities X 4  Post vital signs: Reviewed and stable  Last Vitals:  Vitals Value Taken Time  BP 117/98 05/07/23 1316  Temp 36.4 C 05/07/23 1316  Pulse 60 05/07/23 1316  Resp 95 05/07/23 1316  SpO2 96 % 05/07/23 1316    Last Pain:  Vitals:   05/07/23 1316  TempSrc: Oral  PainSc: 0-No pain      Patients Stated Pain Goal: 7 (05/07/23 1238)  Complications: No notable events documented.

## 2023-05-07 NOTE — Anesthesia Preprocedure Evaluation (Addendum)
 Anesthesia Evaluation  Patient identified by MRN, date of birth, ID band Patient awake    Reviewed: Allergy & Precautions, H&P , NPO status , Patient's Chart, lab work & pertinent test results, reviewed documented beta blocker date and time   Airway Mallampati: III  TM Distance: >3 FB Neck ROM: full    Dental  (+) Partial Upper, Partial Lower, Missing   Pulmonary shortness of breath, sleep apnea , former smoker Pulmonary fibrosis   Pulmonary exam normal breath sounds clear to auscultation       Cardiovascular Exercise Tolerance: Good hypertension, Pt. on medications Normal cardiovascular exam Rhythm:Regular Rate:Normal     Neuro/Psych  PSYCHIATRIC DISORDERS Anxiety     negative neurological ROS     GI/Hepatic negative GI ROS, Neg liver ROS,GERD  ,,  Endo/Other    Class 3 obesity  Renal/GU negative Renal ROS  negative genitourinary   Musculoskeletal  (+) Arthritis , Osteoarthritis,    Abdominal   Peds  Hematology negative hematology ROS (+)   Anesthesia Other Findings   Reproductive/Obstetrics negative OB ROS                             Anesthesia Physical Anesthesia Plan  ASA: 3  Anesthesia Plan: General   Post-op Pain Management: Minimal or no pain anticipated   Induction: Intravenous  PONV Risk Score and Plan: Propofol infusion  Airway Management Planned: Nasal Cannula and Natural Airway  Additional Equipment: None  Intra-op Plan:   Post-operative Plan:   Informed Consent: I have reviewed the patients History and Physical, chart, labs and discussed the procedure including the risks, benefits and alternatives for the proposed anesthesia with the patient or authorized representative who has indicated his/her understanding and acceptance.     Dental Advisory Given  Plan Discussed with: CRNA  Anesthesia Plan Comments:         Anesthesia Quick Evaluation

## 2023-05-07 NOTE — Progress Notes (Unsigned)
 Overnight oximetry on room air with CPAP Dated 12/29/2022 Total time of study- 7 hours 20 minutes Time spent less than 88%- 7 minutes 17 seconds SpO2 less than 5% below baseline-4 seconds  Chilton Greathouse MD [facility] See Amion for pager  If no response to pager , please call [phone] until 7pm After 7:00 pm call Elink  [phone] 05/07/2023, 1:52 PM

## 2023-05-07 NOTE — Pre-Procedure Instructions (Signed)
 Expand All Collapse All     Alexander Nunez, male    DOB: [birth year]   MRN: [id number]     Brief patient profile:  77yowm  quit smoking 1975 s apparent sequelae  [name] pt with NSIP self referred to pulmonary clinic acutely 04/24/2023  for dyspnea        Baseline = still walking grocery shopping ok with sats 88% lowest when      Phone call 04/23/23 The patient tested positive for Covid around 04/09/23 got Molnupiravir  rprior to phone call  and is now testing negative. He has ongoing chest congestion, productive cough with yellow and clear sputum and worsening shortness of breath. These symptoms have worsened over the past 2-3 days. He has been using his albuterol inhaler and taking Mucinex. He is not on supplemental oxygen. He stated that he feels like he "can't take a deep breath and his oxygen level is dropping more than usual." It is normally 93-95% with exertion it can get down to 90%. Now it's dropping to 87-89% with coughing and exertion. He was coughing in the chair this morning prior to calling nurse triage and it was 83% but it came back up to 92% prior to the triage. He used his albuterol inhaler and took 4 puffs and it came back up to 91-92%. He stated that the last time he had similar symptoms, he was prescribed prednisone and a z-pak.    History of Present Illness  04/24/2023  Pulmonary/ 1st office eval/[facility]      Chief Complaint  Patient presents with   Acute Visit      Dx with Covid 19 2 wks ago. He c/o lingering SOB and fatigued. Has minimal cough-prod first thing am with yellow sputum.   Dyspnea:  improving as are sats  Cough: better this am / slt yellow / lots of nasal congestion  Sleep: on pillow no resp cc now  SABA use: as above  02 NWG:NFAO      No obvious day to day or daytime pattern/variability or assoc excess/ purulent sputum or mucus plugs or hemoptysis or cp or chest tightness, subjective wheeze or overt   hb symptoms.     Also denies any obvious fluctuation of  symptoms with weather or environmental changes or other aggravating or alleviating factors except as outlined above    No unusual exposure hx or h/o childhood pna/ asthma or knowledge of premature birth.   Current Allergies, Complete Past Medical History, Past Surgical History, Family History, and Social History were reviewed in [employer] record.   ROS  The following are not active complaints unless bolded Hoarseness, sore throat, dysphagia, dental problems, itching, sneezing,  nasal congestion or discharge of excess mucus or purulent secretions, ear ache,   fever, chills, sweats, unintended wt loss or wt gain, classically pleuritic or exertional cp,  orthopnea pnd or arm/hand swelling  or leg swelling, presyncope, palpitations, abdominal pain/ gassy since stared protonix , anorexia, nausea, vomiting, diarrhea  or change in bowel habits or change in bladder habits, change in stools or change in urine, dysuria, hematuria,  rash, arthralgias, visual complaints, headache, numbness, weakness or ataxia or problems with walking or coordination,  change in mood or  memory.                      Outpatient Medications Prior to Visit  Medication Sig Dispense Refill   albuterol (VENTOLIN HFA) 108 (90 Base) MCG/ACT inhaler Inhale 1-2  puffs into the lungs every 4 (four) hours as needed for shortness of breath or wheezing. 6.7 g 5   ALPRAZolam (XANAX) 0.5 MG tablet Take 0.25-0.5 mg by mouth 3 (three) times daily as needed for sleep.       azaTHIOprine (IMURAN) 50 MG tablet TAKE 2 TABLETS BY MOUTH EVERY DAY 180 tablet 2   doxazosin (CARDURA) 4 MG tablet Take 4 mg by mouth at bedtime.         fluticasone (FLONASE) 50 MCG/ACT nasal spray Place 2 sprays into both nostrils daily. 16 g 0   losartan (COZAAR) 25 MG tablet Take 25 mg by mouth daily.       metoprolol tartrate (LOPRESSOR) 50 MG tablet Take 50 mg by mouth 2 (two) times daily.       Nintedanib (OFEV) 150 MG CAPS Take 1 capsule  (150 mg total) by mouth 2 (two) times daily. 180 capsule 1   pantoprazole (PROTONIX) 40 MG tablet Take 40 mg by mouth daily.       pravastatin (PRAVACHOL) 20 MG tablet Take 20 mg by mouth every evening.        alclomethasone (ACLOVATE) 0.05 % cream Apply 1 Application topically 2 (two) times daily.       Azelastine HCl 137 MCG/SPRAY SOLN PLACE 2 SPRAYS INTO BOTH NOSTRILS 2 TIMES DAILY AS NEEDED FOR RHINITIS. 90 mL 1   montelukast (SINGULAIR) 10 MG tablet Take 10 mg by mouth daily.          No facility-administered medications prior to visit.            Past Medical History:  Diagnosis Date   Anxiety     Arthritis      back   Back pain, chronic     Colon polyp     Diverticulitis     GERD (gastroesophageal reflux disease)     Heart palpitations     High cholesterol     Hypertension     Irritable bowel syndrome     Osteoporosis     Psoriasis     Pulmonary fibrosis (HCC)     Pulmonary fibrosis (HCC)     Sleep apnea      c- pap              Objective:    Objective BP 108/60 (BP Location: Left Arm, Cuff Size: Normal)   Pulse (!) 59   Temp 97.8 F (36.6 C) (Oral)   Ht 5\' 11"  (1.803 m)   Wt 247 lb (112 kg)   SpO2 98% Comment: on RA  BMI 34.45 kg/m    SpO2: 98 % (on RA)   Amb mod obese  (by BMI ) wm nad    HEENT : Oropharynx  clear      Nasal turbinates nl      NECK :  without  apparent JVD/ palpable Nodes/TM      LUNGS: no acc muscle use,  Nl contour chest  with coarse insp / exp rhonchi bilaterally    CV:  RRR  no s3 or murmur or increase in P2, and no edema    ABD:  soft and nontender    MS:  Gait nl   ext warm without deformities Or obvious joint restrictions  calf tenderness, cyanosis or clubbing    SKIN: warm and dry without lesions     NEURO:  alert, approp, nl sensorium with  no motor or cerebellar deficits apparent.      CXR PA  and Lateral:   04/24/2023 :    I personally reviewed images and impression is as follows:        Slightly improved  vs prior studies    Assessment    ILD (interstitial lung disease) (HCC) 12/22/2018-CT chest high-res-pulmonary parenchymal pattern of fibrosis appears slightly more organized than on 07/11/2017 associated with air trapping, findings may be due to chronic hypersensitivity pneumonitis, alternative diagnosis, slight increase in size of mediastinal lymph nodes, dominant right thyroid nodule previously biopsied on 04/15/2017, enlarged pulmonary trunk, emphysema   Mild flare of cough / dyspnea with desats in setting of covid 19 04/09/22 s/p  molnupiravir    ? Mild acute bronchitis component > rx zpak/ pred  x 6 days taper off      Try change ppi to aciphex 20 mg q am ac given cc too much abd gas since starting  protonix and have not had this problem with aciphex   Use of PPI is associated with improved survival time and with decreased radiologic fibrosis per King's study published in AJRCCM vol 184 p1390.  Dec 2011 and also may have other beneficial effects as per the latest review in [facility] vol 193 p1345 Jun 20016. So important to stay o this med   Monitor serially with ex sats:  instructed: Make sure you check your oxygen saturation  AT  your highest level of activity (not after you stop)   to be sure it stays over 90% and adjust  02 flow upward to maintain this level if needed but remember to turn it back to previous settings when you stop (to conserve your supply).      No change maint rx needed            Each maintenance medication was reviewed in detail including emphasizing most importantly the difference between maintenance and prns and under what circumstances the prns are to be triggered using an action plan format where appropriate.   Total time for H and P, chart review, counseling, reviewing hfa/pulse ox  device(s) and generating customized AVS unique to this office visit / same day charting = 40 min with complex pt new to me        Late add 05/06/2023 > there is no contraindication to  endoscopic procedures unless having an acute flare of ILD which was not the case at most recent pulmonary ov so I cleared him for elective endoscopy

## 2023-05-08 ENCOUNTER — Encounter (HOSPITAL_COMMUNITY): Payer: Self-pay | Admitting: Gastroenterology

## 2023-05-08 ENCOUNTER — Telehealth: Payer: Self-pay

## 2023-05-08 LAB — SURGICAL PATHOLOGY

## 2023-05-08 NOTE — Telephone Encounter (Signed)
 Patient called stating he wants his ct scan scheduled at [facility] in [place]. He wants it scheduled the first of June.

## 2023-05-08 NOTE — Telephone Encounter (Signed)
 Patient already underwent EGD yesterday on 4/3.  He tolerated the procedure well.  Nothing further needed

## 2023-05-08 NOTE — Telephone Encounter (Signed)
 Pt has been scheduled at [facility] and has been made aware of appt. NFN

## 2023-05-12 ENCOUNTER — Encounter (INDEPENDENT_AMBULATORY_CARE_PROVIDER_SITE_OTHER): Payer: Self-pay | Admitting: *Deleted

## 2023-05-12 DIAGNOSIS — M7062 Trochanteric bursitis, left hip: Secondary | ICD-10-CM | POA: Diagnosis not present

## 2023-05-14 NOTE — Progress Notes (Signed)
 Dr Sherene Sires- pt was asking about getting a CT Chest- is this needed? I did not see mention in last note.

## 2023-05-28 DIAGNOSIS — M7062 Trochanteric bursitis, left hip: Secondary | ICD-10-CM | POA: Diagnosis not present

## 2023-05-28 DIAGNOSIS — M25552 Pain in left hip: Secondary | ICD-10-CM | POA: Diagnosis not present

## 2023-05-29 ENCOUNTER — Ambulatory Visit (INDEPENDENT_AMBULATORY_CARE_PROVIDER_SITE_OTHER): Admitting: Gastroenterology

## 2023-06-11 ENCOUNTER — Emergency Department (HOSPITAL_COMMUNITY)

## 2023-06-11 ENCOUNTER — Emergency Department (HOSPITAL_COMMUNITY)
Admission: EM | Admit: 2023-06-11 | Discharge: 2023-06-11 | Disposition: A | Discharge status: Home / Self Care | Attending: Emergency Medicine | Admitting: Emergency Medicine

## 2023-06-11 ENCOUNTER — Encounter (HOSPITAL_COMMUNITY): Payer: Self-pay

## 2023-06-11 ENCOUNTER — Encounter: Payer: Self-pay | Admitting: *Deleted

## 2023-06-11 DIAGNOSIS — J8410 Pulmonary fibrosis, unspecified: Secondary | ICD-10-CM | POA: Diagnosis not present

## 2023-06-11 DIAGNOSIS — R0902 Hypoxemia: Secondary | ICD-10-CM | POA: Diagnosis not present

## 2023-06-11 DIAGNOSIS — R0602 Shortness of breath: Secondary | ICD-10-CM

## 2023-06-11 DIAGNOSIS — I10 Essential (primary) hypertension: Secondary | ICD-10-CM | POA: Diagnosis not present

## 2023-06-11 LAB — CBC
HCT: 36.8 % — ABNORMAL LOW (ref 39.0–52.0)
Hemoglobin: 12.6 g/dL — ABNORMAL LOW (ref 13.0–17.0)
MCH: 32.9 pg (ref 26.0–34.0)
MCHC: 34.2 g/dL (ref 30.0–36.0)
MCV: 96.1 fL (ref 80.0–100.0)
Platelets: 272 10*3/uL (ref 150–400)
RBC: 3.83 MIL/uL — ABNORMAL LOW (ref 4.22–5.81)
RDW: 14.2 % (ref 11.5–15.5)
WBC: 10.5 10*3/uL (ref 4.0–10.5)
nRBC: 0 % (ref 0.0–0.2)

## 2023-06-11 LAB — BASIC METABOLIC PANEL WITH GFR
Anion gap: 8 (ref 5–15)
BUN: 16 mg/dL (ref 8–23)
CO2: 24 mmol/L (ref 22–32)
Calcium: 9 mg/dL (ref 8.9–10.3)
Chloride: 103 mmol/L (ref 98–111)
Creatinine, Ser: 1.32 mg/dL — ABNORMAL HIGH (ref 0.61–1.24)
GFR, Estimated: 56 mL/min — ABNORMAL LOW (ref >60)
Glucose, Bld: 139 mg/dL — ABNORMAL HIGH (ref 70–99)
Potassium: 3.6 mmol/L (ref 3.5–5.1)
Sodium: 135 mmol/L (ref 135–145)

## 2023-06-11 MED ORDER — METHYLPREDNISOLONE 4 MG PO TBPK: ORAL_TABLET | ORAL | 0 refills | Status: DC

## 2023-06-11 MED ORDER — IPRATROPIUM-ALBUTEROL 0.5-2.5 (3) MG/3ML IN SOLN
3.0000 mL | Freq: Once | RESPIRATORY_TRACT | Status: AC
  Administered 2023-06-11: 3 mL via RESPIRATORY_TRACT
  Filled 2023-06-11: qty 3

## 2023-06-11 MED ORDER — AZITHROMYCIN 250 MG PO TABS: 250.0000 mg | ORAL_TABLET | Freq: Every day | ORAL | 0 refills | Status: DC

## 2023-06-11 MED ORDER — METHYLPREDNISOLONE SODIUM SUCC 125 MG IJ SOLR
125.0000 mg | Freq: Once | INTRAMUSCULAR | Status: AC
  Administered 2023-06-11: 125 mg via INTRAVENOUS
  Filled 2023-06-11: qty 2

## 2023-06-11 NOTE — ED Notes (Signed)
 Pt ambulated in hallway, oxygen  saturation between 90-94% on RA. Pt sat down on bed after walk and oxygen  dropped to 86%.

## 2023-06-11 NOTE — ED Triage Notes (Signed)
 Pt reports hx of pulmonary fibrosis and it having a lot of exertional dyspnea today with O2 dropping to 84% just from walking across the room.

## 2023-06-11 NOTE — Discharge Instructions (Signed)
`   Your testing today revealed no signs of pneumonia, your EKG was good, your oxygen  did drop when you walked but it comes back up when you rest.  You have seemed to improve after getting treatment such as the steroid and the breathing treatment.  I would like for you to take the following medications and follow-up with your doctor within 2 or 3 days if no better although I want you to come back to the ER if you are developing severe or worsening symptoms  Medrol , taper over 6 days Albuterol , 2 puffs every 4 hours for the next 24 hours, then 2 puffs every 4 hours as needed. Zithromax , daily for the next 5 days  Thank you for allowing us  to treat you in the emergency department today.  After reviewing your examination and potential testing that was done it appears that you are safe to go home.  I would like for you to follow-up with your doctor within the next several days, have them obtain your records and follow-up with them to review all potential tests and results from your visit.  If you should develop severe or worsening symptoms return to the emergency department immediately

## 2023-06-11 NOTE — ED Provider Notes (Signed)
 [place] EMERGENCY DEPARTMENT AT [facility] Provider Note   CSN: [id number] Arrival date & time: 06/11/23  1953     History  Chief Complaint  Patient presents with   Shortness of Breath    Alexander Nunez is a 78 y.o. male.   Shortness of Breath  This patient is a 78 year old male with a history of pulmonary fibrosis, he is currently on medications including pravastatin  losartan Cardura  but also takes azathioprine  and nintedanib, presents with increasing shortness of breath today.  He reports that he was outside yesterday cutting shrubbery and had no problem, today after walking across the house he became short of breath.  He still states he feels little short of breath, his oxygen  came back at 84% on his finger monitor at home.  He does not use supplemental oxygen .  No recent admissions to the hospital though he did have COVID 2 months ago but healed completely from it.  He follows with Dr. Waylan Haggard with pulmonary critical care has follow-up with pulmonary function testing in June.  He denies swelling of the legs, no history of cardiac abnormalities, asthma, COPD, tobacco use and has never had pulmonary embolism.  He does use albuterol  inhalers as needed and has used it a couple of times today with some improvement    Home Medications Prior to Admission medications   Medication Sig Start Date End Date Taking? Authorizing Provider  acetaminophen  (TYLENOL ) 500 MG tablet Take 1,000 mg by mouth every 6 (six) hours as needed for mild pain (pain score 1-3).   Yes [provider]  albuterol  (VENTOLIN  HFA) 108 (90 Base) MCG/ACT inhaler Inhale 1-2 puffs into the lungs every 4 (four) hours as needed for shortness of breath or wheezing. 11/14/21  Yes McQuaid, Douglas B, MD  ALPRAZolam  (XANAX ) 0.5 MG tablet Take 0.25-0.5 mg by mouth 3 (three) times daily as needed for sleep.   Yes [provider]  azaTHIOprine  (IMURAN ) 50 MG tablet TAKE 2 TABLETS BY MOUTH EVERY DAY  01/09/23  Yes [name], Praveen, MD  azithromycin  (ZITHROMAX  Z-PAK) 250 MG tablet Take 1 tablet (250 mg total) by mouth daily. 500mg  PO day 1, then 250mg  PO days 205 06/11/23  Yes Early Glisson, MD  doxazosin  (CARDURA ) 4 MG tablet Take 4 mg by mouth at bedtime.     Yes [provider]  fluticasone  (FLONASE ) 50 MCG/ACT nasal spray Place 2 sprays into both nostrils daily. 09/11/22  Yes Leath-Warren, Belen Bowers, NP  guaiFENesin  (MUCINEX ) 600 MG 12 hr tablet Take 600 mg by mouth 2 (two) times daily as needed for to loosen phlegm or cough.   Yes [provider]  ibuprofen (ADVIL) 200 MG tablet Take 400 mg by mouth every 6 (six) hours as needed for mild pain (pain score 1-3).   Yes [provider]  losartan (COZAAR) 25 MG tablet Take 25 mg by mouth daily. 08/28/21  Yes [provider]  methylPREDNISolone  (MEDROL  DOSEPAK) 4 MG TBPK tablet Taper over 6 days 06/11/23  Yes Early Glisson, MD  metoprolol  tartrate (LOPRESSOR ) 50 MG tablet Take 25 mg by mouth 2 (two) times daily. 04/03/23  Yes [provider]  Nintedanib (OFEV ) 150 MG CAPS Take 1 capsule (150 mg total) by mouth 2 (two) times daily. 12/16/22  Yes [name], Praveen, MD  pantoprazole  (PROTONIX ) 40 MG tablet Take 1 tablet (40 mg total) by mouth daily. 05/07/23 05/06/24 Yes Ahmed, Forbes Ida, MD  pravastatin  (PRAVACHOL ) 20 MG tablet Take 20 mg by mouth every  evening.    Yes [provider]  simethicone  (MYLICON) 125 MG chewable tablet Chew 250 mg by mouth every 6 (six) hours as needed for flatulence.   Yes [provider]      Allergies    Cephalexin and Iodinated contrast media    Review of Systems   Review of Systems  Respiratory:  Positive for shortness of breath.   All other systems reviewed and are negative.   Physical Exam Updated Vital Signs BP (!) 119/57   Pulse 60   Temp 97.8 F (36.6 C) (Oral)   Resp 20   Ht 1.803 m (5\' 11" )   Wt 109.8 kg   SpO2 94%   BMI 33.75 kg/m  Physical  Exam Vitals and nursing note reviewed.  Constitutional:      General: He is not in acute distress.    Appearance: He is well-developed.  HENT:     Head: Normocephalic and atraumatic.     Mouth/Throat:     Pharynx: No oropharyngeal exudate.  Eyes:     General: No scleral icterus.       Right eye: No discharge.        Left eye: No discharge.     Conjunctiva/sclera: Conjunctivae normal.     Pupils: Pupils are equal, round, and reactive to light.  Neck:     Thyroid : No thyromegaly.     Vascular: No JVD.  Cardiovascular:     Rate and Rhythm: Normal rate and regular rhythm.     Heart sounds: Normal heart sounds. No murmur heard.    No friction rub. No gallop.  Pulmonary:     Effort: Pulmonary effort is normal. No respiratory distress.     Breath sounds: Rales present. No wheezing.  Abdominal:     General: Bowel sounds are normal. There is no distension.     Palpations: Abdomen is soft. There is no mass.     Tenderness: There is no abdominal tenderness.  Musculoskeletal:        General: No tenderness. Normal range of motion.     Cervical back: Normal range of motion and neck supple.     Right lower leg: No edema.     Left lower leg: No edema.  Lymphadenopathy:     Cervical: No cervical adenopathy.  Skin:    General: Skin is warm and dry.     Findings: No erythema or rash.  Neurological:     Mental Status: He is alert.     Coordination: Coordination normal.  Psychiatric:        Behavior: Behavior normal.     ED Results / Procedures / Treatments   Labs (all labs ordered are listed, but only abnormal results are displayed) Labs Reviewed  BASIC METABOLIC PANEL WITH GFR - Abnormal; Notable for the following components:      Result Value   Glucose, Bld 139 (*)    Creatinine, Ser 1.32 (*)    GFR, Estimated 56 (*)    All other components within normal limits  CBC - Abnormal; Notable for the following components:   RBC 3.83 (*)    Hemoglobin 12.6 (*)    HCT 36.8 (*)     All other components within normal limits    EKG EKG Interpretation Date/Time:  Thursday Jun 11 2023 20:38:51 EDT Ventricular Rate:  65 PR Interval:  181 QRS Duration:  120 QT Interval:  405 QTC Calculation: 422 R Axis:   25  Text Interpretation: Sinus rhythm IVCD,  consider atypical RBBB since last tracing no significant change Confirmed by Early Glisson ([id number]) on 06/11/2023 9:05:12 PM  Radiology DG Chest Port 1 View Result Date: 06/11/2023 CLINICAL DATA:  Pulmonary fibrosis, exertional dyspnea, hypoxia, shortness of breath EXAM: PORTABLE CHEST 1 VIEW COMPARISON:  04/24/2023 FINDINGS: Overall similar diffuse asymmetric reticulonodular interstitial opacities compatible with extensive pulmonary fibrosis. No definite superimposed acute process or significant interval change. No focal collapse or consolidation. Negative for large effusion or pneumothorax. Trachea midline. Degenerative changes of the spine. IMPRESSION: Extensive pulmonary fibrosis without significant interval change. Electronically Signed   By: Melven Stable.  Shick M.D.   On: 06/11/2023 21:08    Procedures Procedures    Medications Ordered in ED Medications  ipratropium-albuterol  (DUONEB) 0.5-2.5 (3) MG/3ML nebulizer solution 3 mL (3 mLs Nebulization Given 06/11/23 2029)  methylPREDNISolone  sodium succinate  (SOLU-MEDROL ) 125 mg/2 mL injection 125 mg (125 mg Intravenous Given 06/11/23 2051)    ED Course/ Medical Decision Making/ A&P                                 Medical Decision Making Amount and/or Complexity of Data Reviewed Labs: ordered. Radiology: ordered.  Risk Prescription drug management.    This patient presents to the ED for concern of shortness of breath, this involves an extensive number of treatment options, and is a complaint that carries with it a high risk of complications and morbidity.  The differential diagnosis includes pulmonary fibrosis, pneumothorax, pneumonia, seems less likely to be pulmonary embolism.   Will get labs x-ray and EKG.  Patient agreeable to the plan, will also give nebulizer treatment and steroid   Co morbidities that complicate the patient evaluation  Neri fibrosis and hypertension   Additional history obtained:  Additional history obtained from medical record External records from outside source obtained and reviewed including no recent admissions to the hospital, was seen in November for reactive airway disease exacerbation   Lab Tests:  I Ordered, and personally interpreted labs.  The pertinent results include: CBC without leukocytosis or anemia of concern, metabolic panel shows mild increase in creatinine to 1.3 from 1.17, electrolytes otherwise unremarkable.   Imaging Studies ordered:  I ordered imaging studies including chest x-ray I independently visualized and interpreted imaging which showed pulmonary fibrosis without evidence of changes or pneumonia or pneumothorax I agree with the radiologist interpretation   Cardiac Monitoring: / EKG:  The patient was maintained on a cardiac monitor.  I personally viewed and interpreted the cardiac monitored which showed an underlying rhythm of: Normal sinus rhythm   Problem List / ED Course / Critical interventions / Medication management  Ultimately this patient had a benign course, he received a breathing treatment and steroids and was able to ambulate, it was only after walking around the nursing station that he finally dropped below 90%.  Within a minute he was back up to 94% on the bed.  He was not that short of breath and states that he feels much better after the medicines I ordered medication including Solu-Medrol  and albuterol  for shortness of breath Reevaluation of the patient after these medicines showed that the patient improved I have reviewed the patients home medicines and have made adjustments as needed    Social Determinants of Health:  Chronic lung disease   Test / Admission -  Considered:  Considered admission and I offer the patient admission but he declines and states he would rather follow-up with his  pulmonologist as long as he is doing better.  He is willing to try steroids and albuterol  treatments at home, he understands that he should come back if things get worse and he is very amenable to this plan.         Final Clinical Impression(s) / ED Diagnoses Final diagnoses:  Shortness of breath  Pulmonary fibrosis (HCC)    Rx / DC Orders ED Discharge Orders          Ordered    methylPREDNISolone  (MEDROL  DOSEPAK) 4 MG TBPK tablet        06/11/23 2253    azithromycin  (ZITHROMAX  Z-PAK) 250 MG tablet  Daily        06/11/23 2253              Early Glisson, MD 06/11/23 2256

## 2023-06-30 ENCOUNTER — Telehealth: Payer: Self-pay | Admitting: Pulmonary Disease

## 2023-06-30 DIAGNOSIS — G4733 Obstructive sleep apnea (adult) (pediatric): Secondary | ICD-10-CM | POA: Diagnosis not present

## 2023-06-30 DIAGNOSIS — I10 Essential (primary) hypertension: Secondary | ICD-10-CM | POA: Diagnosis not present

## 2023-06-30 NOTE — Telephone Encounter (Signed)
Will await Dr. Matilde Bash response.

## 2023-06-30 NOTE — Telephone Encounter (Signed)
 On Pt last visit Dr. Waylan Haggard ordered a CT scan and a PFT. Soon after he had an acute visit w/Dr. Waymond Hailey who canceled the CT scan because of the PT's recent Covid.  PT has had a PFT sched since March and was told Dr. Isabel Many sched was not out yet at that time and that we would contact him once it did come out for the FU appt. Now he calls for FU (and to ask about the CT) and Dr. Waylan Haggard has nothing until August. Can we get him in sooner to avoid frustration and did Dr. Waylan Haggard now want him to have a CT? Please call PT to advice. TY.

## 2023-07-02 NOTE — Telephone Encounter (Signed)
 Toy Freund, will you schedule patient in 07/13/2023 at 1:30 with Dr. Waylan Haggard. Please let me know once appt has been scheduled. Thanks

## 2023-07-02 NOTE — Telephone Encounter (Addendum)
 Can he see one of the APP's after his PFTs?.  If not then let us  okay to overbook after his PFTs

## 2023-07-03 NOTE — Telephone Encounter (Signed)
 Patient has been scheduled 6/9 @1 :30pm with Dr. Waylan Haggard

## 2023-07-03 NOTE — Telephone Encounter (Signed)
 Pt is aware and voiced his understanding.  Nothing further needed.

## 2023-07-06 ENCOUNTER — Ambulatory Visit: Admitting: Physician Assistant

## 2023-07-06 ENCOUNTER — Ambulatory Visit (HOSPITAL_COMMUNITY)

## 2023-07-10 ENCOUNTER — Ambulatory Visit: Admitting: Pulmonary Disease

## 2023-07-10 DIAGNOSIS — J849 Interstitial pulmonary disease, unspecified: Secondary | ICD-10-CM

## 2023-07-10 LAB — PULMONARY FUNCTION TEST
DL/VA % pred: 82 %
DL/VA: 3.22 ml/min/mmHg/L
DLCO cor % pred: 37 %
DLCO cor: 9.7 ml/min/mmHg
DLCO unc % pred: 35 %
DLCO unc: 9.11 ml/min/mmHg
FEF 25-75 Post: 3.75 L/s
FEF 25-75 Pre: 3.16 L/s
FEF2575-%Change-Post: 18 %
FEF2575-%Pred-Post: 168 %
FEF2575-%Pred-Pre: 142 %
FEV1-%Change-Post: 3 %
FEV1-%Pred-Post: 56 %
FEV1-%Pred-Pre: 54 %
FEV1-Post: 1.77 L
FEV1-Pre: 1.7 L
FEV1FVC-%Change-Post: 5 %
FEV1FVC-%Pred-Pre: 122 %
FEV6-%Change-Post: -1 %
FEV6-%Pred-Post: 46 %
FEV6-%Pred-Pre: 47 %
FEV6-Post: 1.9 L
FEV6-Pre: 1.93 L
FEV6FVC-%Pred-Post: 106 %
FEV6FVC-%Pred-Pre: 106 %
FVC-%Change-Post: -1 %
FVC-%Pred-Post: 44 %
FVC-%Pred-Pre: 44 %
FVC-Post: 1.91 L
FVC-Pre: 1.93 L
Post FEV1/FVC ratio: 93 %
Post FEV6/FVC ratio: 100 %
Pre FEV1/FVC ratio: 88 %
Pre FEV6/FVC Ratio: 100 %
RV % pred: 4 %
RV: 0.13 L
TLC % pred: 37 %
TLC: 2.69 L

## 2023-07-10 NOTE — Progress Notes (Signed)
 Full PFT performed today.

## 2023-07-10 NOTE — Patient Instructions (Signed)
 Full PFT performed today.

## 2023-07-13 ENCOUNTER — Ambulatory Visit: Admitting: Pulmonary Disease

## 2023-07-13 ENCOUNTER — Encounter: Payer: Self-pay | Admitting: Pulmonary Disease

## 2023-07-13 VITALS — BP 127/83 | HR 73 | Ht 71.0 in | Wt 234.6 lb

## 2023-07-13 DIAGNOSIS — J849 Interstitial pulmonary disease, unspecified: Secondary | ICD-10-CM | POA: Diagnosis not present

## 2023-07-13 DIAGNOSIS — Z5181 Encounter for therapeutic drug level monitoring: Secondary | ICD-10-CM | POA: Diagnosis not present

## 2023-07-13 LAB — CBC WITH DIFFERENTIAL/PLATELET
Basophils Absolute: 0.1 10*3/uL (ref 0.0–0.1)
Basophils Relative: 0.8 % (ref 0.0–3.0)
Eosinophils Absolute: 0.4 10*3/uL (ref 0.0–0.7)
Eosinophils Relative: 4.2 % (ref 0.0–5.0)
HCT: 40 % (ref 39.0–52.0)
Hemoglobin: 13.5 g/dL (ref 13.0–17.0)
Lymphocytes Relative: 21.4 % (ref 12.0–46.0)
Lymphs Abs: 2.2 10*3/uL (ref 0.7–4.0)
MCHC: 33.8 g/dL (ref 30.0–36.0)
MCV: 95.3 fl (ref 78.0–100.0)
Monocytes Absolute: 1.1 10*3/uL — ABNORMAL HIGH (ref 0.1–1.0)
Monocytes Relative: 10.8 % (ref 3.0–12.0)
Neutro Abs: 6.5 10*3/uL (ref 1.4–7.7)
Neutrophils Relative %: 62.8 % (ref 43.0–77.0)
Platelets: 320 10*3/uL (ref 150.0–400.0)
RBC: 4.2 Mil/uL — ABNORMAL LOW (ref 4.22–5.81)
RDW: 15.1 % (ref 11.5–15.5)
WBC: 10.4 10*3/uL (ref 4.0–10.5)

## 2023-07-13 LAB — COMPREHENSIVE METABOLIC PANEL WITH GFR
ALT: 7 U/L (ref 0–53)
AST: 14 U/L (ref 0–37)
Albumin: 4 g/dL (ref 3.5–5.2)
Alkaline Phosphatase: 57 U/L (ref 39–117)
BUN: 14 mg/dL (ref 6–23)
CO2: 29 meq/L (ref 19–32)
Calcium: 9.5 mg/dL (ref 8.4–10.5)
Chloride: 103 meq/L (ref 96–112)
Creatinine, Ser: 1.16 mg/dL (ref 0.40–1.50)
GFR: 60.89 mL/min (ref >60.00)
Glucose, Bld: 100 mg/dL — ABNORMAL HIGH (ref 70–99)
Potassium: 4.5 meq/L (ref 3.5–5.1)
Sodium: 139 meq/L (ref 135–145)
Total Bilirubin: 0.6 mg/dL (ref 0.2–1.2)
Total Protein: 7.2 g/dL (ref 6.0–8.3)

## 2023-07-13 MED ORDER — AZATHIOPRINE 50 MG PO TABS: 150.0000 mg | ORAL_TABLET | Freq: Every day | ORAL | 3 refills | Status: AC

## 2023-07-13 NOTE — Patient Instructions (Signed)
 VISIT SUMMARY:  Today, we discussed your worsening respiratory symptoms and gastrointestinal issues. You have experienced a decline in lung function, particularly after your COVID-19 infection in March. You also reported recent weight loss and gastrointestinal symptoms, including diarrhea and gas, which you associate with your medications and irritable bowel syndrome (IBS).  YOUR PLAN:  -INTERSTITIAL LUNG DISEASE: Interstitial lung disease is a condition that causes scarring of the lungs, leading to breathing difficulties. Your lung function has declined, and you experience shortness of breath during activities and low oxygen  levels at night. We will order a chest CT scan to reassess your lung condition, increase your azathioprine  dose to 150 mg daily, and perform a walk test to determine if you need supplemental oxygen . If your oxygen  levels are low, you may need to use oxygen  at night with your CPAP machine and during exertion.  -COVID-19: You had a COVID-19 infection in March, which may have worsened your interstitial lung disease. You were treated with Paxlovid for the infection.  -DIARRHEA DUE TO OFEV : Ofev , a medication you are taking for your lung disease, is likely causing your chronic diarrhea. Although the diarrhea is not severe, it is frequent and associated with gas. We discussed an alternative medication, Esbriet, which does not cause diarrhea, but you prefer to continue with Ofev . We will monitor your symptoms and consider switching to Esbriet if your diarrhea worsens.   INSTRUCTIONS:  Please schedule a chest CT scan at [facility] to reassess your lung condition. We will also perform a walk test to determine if you need supplemental oxygen . Continue taking Ofev  at the current dose and monitor your diarrhea symptoms. If your symptoms worsen, we may consider switching to Esbriet. Follow up with your gastroenterologist as needed for your symptoms.

## 2023-07-13 NOTE — Progress Notes (Unsigned)
 Alexander Nunez    [id number]    03/28/1945  Primary Care Physician:Fusco, Salena Craven, MD  Referring Physician: Kathyleen Parkins, MD [address] [place],  [place] [address]  Problem list:  Follow-up for ILD, pulmonary fibrosis On Ofev  since Jan 2023 Cellcept  and myfortic  poorly tolerated in 2024 On Azathoprine since July 2024, dose increased to 100 mg in Oct 2024 and to 150mg  on June 2025  HPI: 78 y.o. with history of interstitial lung disease. hypertension, hyperlipidemia, sleep apnea, psoriasis  2020 Had an incidental finding of interstitial lung disease on CT abdomen which was done for diverticulitis He has seen Dr. Tauna Nunez at [facility] who did a high resolution CT with findings of alternate diagnosis, possible HP.  He had HP panel on 12/28/2018 which is negative. History notable for psoriasis with skin involvement.  He was on methotrexate for a couple of years and stopped as his skin rash improved.  Last dose of methotrexate was several years ago.  2021 Had COVID-19 infection December 2021, referred for monoclonal antibody  2023 Discussed at multidisciplinary conference on 10/15/2021 CTs show progressive interstitial lung disease and alternate pattern. Differential diagnoses include chronic HP versus NSIP.  Recommend a biopsy and would favor surgical lung biopsy as transbronchial and BAL may not give a definitive diagnosis. MDC see also recommends empiric treatment with antifibrotic's +/- anti-inflammatory if pathology is suggestive.  Started Ofev  in first week of Jan 2023.  He is tolerating it well so far with no issue Has started pulmonary rehab and is doing well States that breathing is better.  2024 Started on CellCept  in February 2024.  He developed rash on his skin immediately after.  He was treated with antifungal cream and discontinuation of CellCept  with improvement in symptoms.  The symptoms recurred after he rechallenge himself with CellCept  and is  now completely off the medication. Started my Myfortic  in April 2024 this was also poorly tolerated with weight loss and fatigue and he is off this medication as well Started azathioprine   2025 Developed COVID infection in March 2025, treated with Paxlovid  Pets: Has a dog.  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  His hobbies include gardening but no significant exposure to mold,No hot tub, Jacuzzi, no down pillows or comforters.  He uses talcum powder every day.  May have had Freon leak from his HVAC system. Likes to play golf.   Smoking history: 30-pack-year smoker.  Quit in 1980 Travel history: No significant travel history Relevant family history: No significant family history of lung disease.  Interval history: Discussed the use of AI scribe software for clinical note transcription with the patient, who gave verbal consent to proceed.  History of Present Illness Alexander Nunez "Alexander Nunez" is a 78 year old male with interstitial lung disease who presents with worsening respiratory symptoms and gastrointestinal issues.  He has experienced a decline in lung function, particularly following a COVID-19 infection in March. He has shortness of breath with exertion, such as vacuuming, and his oxygen  saturation drops to the upper 70s during these activities. He uses a CPAP machine at night, but his oxygen  levels drop to 88-89% during nocturnal awakenings. He has not been on supplemental oxygen . A lung function test was performed recently, and a CT scan was scheduled but canceled due to  a mix-up related to his recent COVID-19 infection. He was treated with Paxlovid for COVID-19.  He reports a recent weight loss from 242 pounds in April to 234 pounds currently, which he attributes to medication side effects,  specifically diarrhea. He is currently taking Ofev  and azathioprine , with the latter at a dose of 100 mg daily. He experiences diarrhea, particularly after meals, and frequent gas, which he associates with his irritable bowel syndrome (IBS). He follows up with a gastroenterologist and had a recent endoscopy that showed no significant findings. He has not had a colonoscopy recently.  He is concerned about the progression of his lung disease and the potential need for oxygen  therapy. His exertional activities are limited due to breathlessness, and his wife observes that he often needs to rest after walking short distances.   Outpatient Encounter Medications as of 07/13/2023  Medication Sig   acetaminophen  (TYLENOL ) 500 MG tablet Take 1,000 mg by mouth every 6 (six) hours as needed for mild pain (pain score 1-3).   albuterol  (VENTOLIN  HFA) 108 (90 Base) MCG/ACT inhaler Inhale 1-2 puffs into the lungs every 4 (four) hours as needed for shortness of breath or wheezing.   ALPRAZolam  (XANAX ) 0.5 MG tablet Take 0.25-0.5 mg by mouth 3 (three) times daily as needed for sleep.   azaTHIOprine  (IMURAN ) 50 MG tablet TAKE 2 TABLETS BY MOUTH EVERY DAY   azithromycin  (ZITHROMAX  Z-PAK) 250 MG tablet Take 1 tablet (250 mg total) by mouth daily. 500mg  PO day 1, then 250mg  PO days 205   doxazosin  (CARDURA ) 4 MG tablet Take 4 mg by mouth at bedtime.     fluticasone  (FLONASE ) 50 MCG/ACT nasal spray Place 2 sprays into both nostrils daily.   guaiFENesin  (MUCINEX ) 600 MG 12 hr tablet Take 600 mg by mouth 2 (two) times daily as needed for to loosen phlegm or cough.   ibuprofen (ADVIL) 200 MG tablet Take 400 mg by mouth every 6 (six) hours as needed for mild pain (pain score 1-3).   losartan (COZAAR) 25 MG tablet Take 25 mg by mouth daily.   methylPREDNISolone  (MEDROL  DOSEPAK) 4 MG TBPK tablet Taper over 6 days   metoprolol  tartrate (LOPRESSOR ) 50 MG tablet Take 25 mg by mouth 2 (two) times daily.   Nintedanib (OFEV ) 150 MG  CAPS Take 1 capsule (150 mg total) by mouth 2 (two) times daily.   pantoprazole  (PROTONIX ) 40 MG tablet Take 1 tablet (40 mg total) by mouth daily.   pravastatin  (PRAVACHOL ) 20 MG tablet Take 20 mg by mouth every evening.    simethicone  (MYLICON) 125 MG chewable tablet Chew 250 mg by mouth every 6 (six) hours as needed for flatulence.   No facility-administered encounter medications on file as of 07/13/2023.   Physical Exam: Blood pressure 127/83, pulse 73, height 5\' 11"  (1.803 m), weight 234 lb 9.6 oz (106.4 kg), SpO2 95%. Gen:      No acute distress HEENT:  EOMI, sclera anicteric Neck:     No masses; no thyromegaly Lungs:    Clear to auscultation bilaterally; normal respiratory effort CV:         Regular rate and rhythm; no murmurs Abd:      + bowel sounds; soft, non-tender; no palpable masses, no distension Ext:    No edema; adequate peripheral perfusion Neuro: alert and oriented x 3 Psych: normal mood and affect   Data Reviewed: Imaging: High-resolution CT 12/24/2018-mild emphysema, traction bronchiectasis, patchy groundglass and reticulation.  More prominent in the upper lobes with  no basal gradient.  Air-trapping present. High-res CT 09/19/2019-stable to minimally changed interstitial lung disease in alternate pattern  CT 03/25/2021-interstitial lung disease with superimposed groundglass opacities in the right upper and left lower lobe.  High-resolution CT 09/12/2021-upper lobe predominant fibrotic interstitial lung disease and alternate pattern.  High resolution CT 11/28/2022-stable/minimally progressive findings of interstitial lung disease in alternate pattern.  I have reviewed the images personally.  PFTs: 01/06/2019 FVC 3 [66%], FEV1 2.61 [78%], F/F 87, TLC 4.33 [59%], DLCO 22.56 [85%] Severe restriction with no obstruction.  Diffusion capacity is normal  10/03/2019 FVC 2.90 [64%], FEV1 2.47 [94%], DLCO 21.35 [81%] Moderate restriction  08/29/2020 FVC 2.14 [1%], FEV1 2.42  [74%], TLC 4.55 [62%], DLCO 19.75 [75%] Moderate restriction, mild diffusion defect  01/08/2022 FVC 2.29 [52%], FEV1 2.05 [64%], F/F89, TLC 3.70 [51%], DLCO 14.84 [57%]  11/13/2022 FVC 2.22 [50%], FEV1 2.00 [62%], F/F90, TLC 3.92 [54%], DLCO 11.54 [44%] Moderate restriction, moderate diffusion defect  07/10/2023 FVC 1.91 [44%], FEV1 1.77 [56%], F/F93, TLC 2.69 [37%], DLCO 9.11 [35%] Severe restriction, severe diffusion defect.  Diffusion defect is not reliable due to not reproducibility  Labs: Hypersensitivity panel 12/28/2018- Negative CTD profile 02/10/2019- Negative  Sleep: Home sleep study 08/03/2021 Moderate OSA with AHI 28.6, low O2 sat of 81%. Assessment & Plan Interstitial lung disease CT scan reviewed with alternate pattern of pulmonary fibrosis with upper lobe predominance.  This could be hypersensitivity pneumonitis though he does not have any significant exposures. Lab work including CTD serologies, hypersensitivity panel is negative.  Has remote exposure to asbestos but CT scan is not typical of asbestosis.  We had multiple discussions for further work-up in detail with patient, including wait and watch with monitoring, bronchoscope with BAL, transbronchial biopsies or surgical lung biopsy.  He is very reluctant to undergo an invasive procedure. Per multidisciplinary conference recommendations for biopsy, treatment with antifibrotic's +/- anti-inflammatory.  After long discussion with patient and family we have decided to start treatment with antifibrotics in 2023 He is tolerating Ofev  well so far.  CellCept  tried 2024 but developed a rash. Myfortic  was poorly tolerated as well. On azathioprine  dose at 100 mg/day.  Finished pulmonary rehab  Progressive decline noted today in lung function with decreased lung volumes and diffusion capacity. Recent COVID-19 infection in March 2025 may have exacerbated the condition. Experiences dyspnea on exertion and nocturnal desaturation.    - [facility] at [facility]  to reassess lung condition - Increase azathioprine  to 150 mg daily, check labs for monitoring  Hypoxemia Desatted on exertion today.  Start supplemental oxygen  during exertion and at night through CPAP. SATURATION QUALIFICATIONS: (This note is used to comply with regulatory documentation for home oxygen )  Patient Saturations on Room Air at Rest = 98%  Patient Saturations on Room Air while Ambulating = 86%  Patient Saturations on 3 Liters of oxygen  while Ambulating = 96%  Please briefly explain why patient needs home oxygen :[id number] could not walk full lap w/o dropping to 86% was placed on POC O2 3L O2 went back to 96%   Diarrhea due to Ofev  Chronic diarrhea likely secondary to Ofev . Reports diarrhea is not severe but associated with significant gas and occurs frequently after meals. Discussed alternative medication Esbriet, which does not have diarrhea as a side effect, but prefers to continue with Ofev  due to established approval and setup. Both medications are equivalent in effectiveness. - Continue Ofev  at current dose - Monitor diarrhea symptoms and consider switching to Esbriet if symptoms worsen  Irritable bowel syndrome Chronic irritable bowel syndrome with symptoms of diarrhea and gas. Follows up with a gastroenterologist and has had previous evaluations including endoscopy with normal findings.   OSA He has diagnosis of OSA 30 years ago.  On auto CPAP Start supplemental oxygen  with CPAP  Plan/Recommendations: - Check  labs for monitoring - Continue Ofev . Increase azathioprine  dose to 150 mg/day - Start supplemental oxygen  - High-res CT  Phyllis Breeze MD [facility] 07/13/2023, 1:43 PM  CC: Alexander Parkins, MD

## 2023-07-14 ENCOUNTER — Ambulatory Visit: Payer: Self-pay | Admitting: Pulmonary Disease

## 2023-07-14 DIAGNOSIS — J849 Interstitial pulmonary disease, unspecified: Secondary | ICD-10-CM | POA: Diagnosis not present

## 2023-07-16 DIAGNOSIS — D518 Other vitamin B12 deficiency anemias: Secondary | ICD-10-CM | POA: Diagnosis not present

## 2023-08-03 ENCOUNTER — Ambulatory Visit (HOSPITAL_COMMUNITY)
Admission: RE | Admit: 2023-08-03 | Discharge: 2023-08-03 | Disposition: A | Discharge status: Home / Self Care | Source: Ambulatory Visit | Attending: Pulmonary Disease | Admitting: Pulmonary Disease

## 2023-08-03 DIAGNOSIS — J8410 Pulmonary fibrosis, unspecified: Secondary | ICD-10-CM | POA: Diagnosis not present

## 2023-08-03 DIAGNOSIS — J849 Interstitial pulmonary disease, unspecified: Secondary | ICD-10-CM | POA: Diagnosis not present

## 2023-08-03 DIAGNOSIS — Z5181 Encounter for therapeutic drug level monitoring: Secondary | ICD-10-CM | POA: Diagnosis not present

## 2023-08-03 DIAGNOSIS — R918 Other nonspecific abnormal finding of lung field: Secondary | ICD-10-CM | POA: Diagnosis not present

## 2023-08-03 DIAGNOSIS — E041 Nontoxic single thyroid nodule: Secondary | ICD-10-CM | POA: Diagnosis not present

## 2023-08-04 ENCOUNTER — Ambulatory Visit (INDEPENDENT_AMBULATORY_CARE_PROVIDER_SITE_OTHER): Admitting: Gastroenterology

## 2023-08-04 ENCOUNTER — Encounter (INDEPENDENT_AMBULATORY_CARE_PROVIDER_SITE_OTHER): Payer: Self-pay | Admitting: Gastroenterology

## 2023-08-04 VITALS — BP 114/71 | HR 76 | Temp 97.1°F | Ht 71.0 in | Wt 232.4 lb

## 2023-08-04 DIAGNOSIS — R197 Diarrhea, unspecified: Secondary | ICD-10-CM | POA: Diagnosis not present

## 2023-08-04 DIAGNOSIS — K529 Noninfective gastroenteritis and colitis, unspecified: Secondary | ICD-10-CM

## 2023-08-04 DIAGNOSIS — R634 Abnormal weight loss: Secondary | ICD-10-CM | POA: Diagnosis not present

## 2023-08-04 DIAGNOSIS — R140 Abdominal distension (gaseous): Secondary | ICD-10-CM | POA: Diagnosis not present

## 2023-08-04 DIAGNOSIS — R1033 Periumbilical pain: Secondary | ICD-10-CM

## 2023-08-04 DIAGNOSIS — R630 Anorexia: Secondary | ICD-10-CM | POA: Diagnosis not present

## 2023-08-04 NOTE — Progress Notes (Unsigned)
 Referring Provider: Bertell Satterfield, MD Primary Care Physician:  Bertell Satterfield, MD Primary GI Physician: Dr. Cinderella   Chief Complaint  Patient presents with   Follow-up    Patient here today for a follow up on diarrhea and gas. Patient says this has not gotten any better, he feels this has worsened.   HPI:   Alexander Nunez is a 78 y.o. male with past medical history of anxiety, arthritis, diverticulitis, GERD, high cholesterol, HTN, IBS, osteoporosis, psoriasis, pulmonary fibrosis, sleep apnea, anal fissure    Patient presenting today for:  Follow up of abdominal pain, weight loss, diarrhea.   Last seen April, at that time reported epigatric pain since November. Having heartburn, taking protonix  40mg  daily, eating makes pain worse. Lost about 40 pounds over the last year. Some diarrhea which is chronic   Recommended continue protonix  40mg  daily, schedule EGD, carafate  1g QID, discuss colonoscopy after EGD.  EGD as below without findings to explain symptoms, recommended beano before meals, low FODMAP guide, SIBO testing of bloating persisted, schedule Colonoscopy  Present:  Patient reports He is now on 3L oxygen  chronically due to progression of his pulmonary fibrosis. He is on OFEV  which he knows can cause diarrhea but diarrhea seems to be progressing and he continues to lose weight. Appetite is not great.  He is taking imodium which seems to help at times but sometimes diarrhea still persists. He can have upwards of 6-7 stools per day. He denies rectal bleeding or melena. Notes some periumbilical pain. Has a lot of bloating and gas still.  Taking gas x which does not seem to help much. He denies nausea or vomiting. He is eating a little less as he states that he has abdominal cramping and more diarrhea after he eats.   CT A/P without contrast: 12/2022 Diverticulosis of descending and sigmoid colon without inflammation. Small amount of free fluid is noted in the pelvis of  uncertain etiology. No other acute abnormality seen in the abdomen or pelvis. No changes of pancreatitis or inflammatory changes of pancreas EGD: 05/2023 - Normal esophagus.                           - Gastritis. Biopsied.                           - Normal duodenal bulb and second portion of the                            duodenum.A. STOMACH, RANDOM, BIOPSY:  Gastric antral / oxyntic mucosa without significant diagnostic  alteration.  No H. pylori identified on HE stain.  Negative for intestinal metaplasia or dysplasia.  Last Colonoscopy:02/2017 internal hemorrhoids, diverticulosis   Past Medical History:  Diagnosis Date   Anxiety    Arthritis    back   Back pain, chronic    Colon polyp    Diverticulitis    GERD (gastroesophageal reflux disease)    Heart palpitations    High cholesterol    Hypertension    Irritable bowel syndrome    Osteoporosis    Psoriasis    Pulmonary fibrosis (HCC)    Pulmonary fibrosis (HCC)    Sleep apnea    c- pap    Past Surgical History:  Procedure Laterality Date   BACK SURGERY  2015   CHOLECYSTECTOMY N/A 10/12/2015   Procedure: LAPAROSCOPIC  CHOLECYSTECTOMY;  Surgeon: Oneil Budge, MD;  Location: [facility];  Service: General;  Laterality: N/A;   COLONOSCOPY     ESOPHAGOGASTRODUODENOSCOPY N/A 05/07/2023   Procedure: EGD (ESOPHAGOGASTRODUODENOSCOPY);  Surgeon: Cinderella Deatrice FALCON, MD;  Location: [facility] SU[facility];  Service: Endoscopy;  Laterality: N/A;  2:00PM;ASA 3   FOOT FRACTURE SURGERY     left foot   KNEE ARTHROSCOPY     left   TONSILLECTOMY     UPPER GASTROINTESTINAL ENDOSCOPY      Current Outpatient Medications  Medication Sig Dispense Refill   acetaminophen  (TYLENOL ) 500 MG tablet Take 1,000 mg by mouth every 6 (six) hours as needed for mild pain (pain score 1-3).     albuterol  (VENTOLIN  HFA) 108 (90 Base) MCG/ACT inhaler Inhale 1-2 puffs into the lungs every 4 (four) hours as needed for shortness of breath or wheezing. 6.7 g 5   ALPRAZolam   (XANAX ) 0.5 MG tablet Take 0.25-0.5 mg by mouth 3 (three) times daily as needed for sleep. (Patient taking differently: Take 0.25-0.5 mg by mouth 3 (three) times daily as needed for sleep.  1/2 at bedtime prn.)     azaTHIOprine  (IMURAN ) 50 MG tablet Take 3 tablets (150 mg total) by mouth daily. 270 tablet 3   doxazosin  (CARDURA ) 4 MG tablet Take 4 mg by mouth at bedtime.       fluticasone  (FLONASE ) 50 MCG/ACT nasal spray Place 2 sprays into both nostrils daily. 16 g 0   guaiFENesin  (MUCINEX ) 600 MG 12 hr tablet Take 600 mg by mouth 2 (two) times daily as needed for to loosen phlegm or cough.     ibuprofen (ADVIL) 200 MG tablet Take 400 mg by mouth every 6 (six) hours as needed for mild pain (pain score 1-3).     losartan (COZAAR) 25 MG tablet Take 25 mg by mouth daily.     metoprolol  tartrate (LOPRESSOR ) 50 MG tablet Take 25 mg by mouth 2 (two) times daily.     Nintedanib (OFEV ) 150 MG CAPS Take 1 capsule (150 mg total) by mouth 2 (two) times daily. 180 capsule 1   pantoprazole  (PROTONIX ) 40 MG tablet Take 1 tablet (40 mg total) by mouth daily. 30 tablet 1   pravastatin  (PRAVACHOL ) 20 MG tablet Take 20 mg by mouth every evening.      simethicone  (MYLICON) 125 MG chewable tablet Chew 250 mg by mouth every 6 (six) hours as needed for flatulence.     No current facility-administered medications for this visit.    Allergies as of 08/04/2023 - Review Complete 08/04/2023  Allergen Reaction Noted   Cephalexin Other (See Comments) 03/07/2008   Iodinated contrast media Hives, Rash, and Dermatitis 09/17/2011    Social History   Socioeconomic History   Marital status: Married    Spouse name: Not on file   Number of children: 2   Years of education: Not on file   Highest education level: Not on file  Occupational History   Occupation: retired    Comment: Retired  Tobacco Use   Smoking status: Former    Current packs/day: 0.00    Average packs/day: 2.0 packs/day for 11.0 years (22.0 ttl  pk-yrs)    Types: Cigarettes    Start date: 05/22/1962    Quit date: 05/21/1973    Years since quitting: 50.2   Smokeless tobacco: Never   Tobacco comments:    Quit 25 yrs now   Vaping Use   Vaping status: Never Used  Substance and Sexual Activity  Alcohol use: Yes    Alcohol/week: 14.0 standard drinks of alcohol    Types: 14 Shots of liquor per week    Comment: 1 drink daily   Drug use: No   Sexual activity: Yes    Partners: Female  Other Topics Concern   Not on file  Social History Narrative   Has dog named Advice worker   Social Drivers of Corporate investment banker Strain: Not on file  Food Insecurity: Not on file  Transportation Needs: Not on file  Physical Activity: Not on file  Stress: Not on file  Social Connections: Not on file    Review of systems General: negative for malaise, night sweats, fever, chills Neck: Negative for lumps, goiter, pain and significant neck swelling Resp: Negative for cough, wheezing, dyspnea at rest CV: Negative for chest pain, leg swelling, palpitations, orthopnea GI: denies melena, hematochezia, nausea, vomiting, constipation, dysphagia, odyonophagia, early satiety +bloating +gas +abdominal discomfort +weight loss +diarrhea   MSK: Negative for joint pain or swelling, back pain, and muscle pain. Derm: Negative for itching or rash Psych: Denies depression, anxiety, memory loss, confusion. No homicidal or suicidal ideation.  Heme: Negative for prolonged bleeding, bruising easily, and swollen nodes. Endocrine: Negative for cold or heat intolerance, polyuria, polydipsia and goiter. Neuro: negative for tremor, gait imbalance, syncope and seizures. The remainder of the review of systems is noncontributory.  Physical Exam: BP 114/71 (BP Location: Left Arm, Patient Position: Sitting, Cuff Size: Large)   Pulse 76   Temp (!) 97.1 F (36.2 C) (Temporal)   Ht 5' 11 (1.803 m)   Wt 232 lb 6.4 oz (105.4 kg)   BMI 32.41 kg/m  General:   Alert  and oriented. No distress noted. Pleasant and cooperative. On supplemental O2  via  n.c. Head:  Normocephalic and atraumatic. Eyes:  Conjuctiva clear without scleral icterus. Mouth:  Oral mucosa pink and moist. Good dentition. No lesions. Heart: Normal rate and rhythm, s1 and s2 heart sounds present.  Lungs: Clear lung sounds in all lobes. Respirations equal and unlabored. Abdomen:  +BS, soft, non-tender and non-distended. No rebound or guarding. No HSM or masses noted. Derm: No palmar erythema or jaundice Msk:  Symmetrical without gross deformities. Normal posture. Extremities:  Without edema. Neurologic:  Alert and  oriented x4 Psych:  Alert and cooperative. Normal mood and affect.  Invalid input(s): 6 MONTHS   ASSESSMENT: Alexander Nunez is a 78 y.o. male presenting today for follow up of abdominal pain, diarrhea, weight loss, bloating/gas  Patient seen previously for upper abdominal pain, early satiety, bloating and weight loss, recommended to have EGD as outlined above, grossly unremarkable. We discussed colonoscopy at last visit but he wanted to hold off until follow up given his UGI symptoms. He notes chronic diarrhea, though worsened lately. He is on OFEV  which can cause diarrhea though he reports continued weight loss, bloating and decreased appetite. At this time, I am recommending proceeding with Colonoscopy for further evaluation as I cannot rule out causes such as microscopic colitis or malignancy. In the meantime, will check celiac panel in regards to his bloating and diarrhea (no SB biopsies taken during EGD) and fecal fat/pancreatic elastase as EPI could present with diarrhea/weight loss as well. If the above evaluations are unremarkable, will proceed with SIBO breath testing. Indications, risks and benefits of procedure discussed in detail with patient. Patient verbalized understanding and is in agreement to proceed with Colonoscopy.    PLAN:  -fecal fat, pancreatic elastase   -  celiac panel  -schedule Colonoscopy with random colonic biopsies ASA III  -consider SIBO testing if the above is unremarkable  -continue imodium PRN for now   All questions were answered, patient verbalized understanding and is in agreement with plan as outlined above.   Follow Up: 3  months   Jerrine Urschel L. Symiah Nowotny, MSN, APRN, AGNP-C Adult-Gerontology Nurse Practitioner [facility]

## 2023-08-04 NOTE — Patient Instructions (Signed)
 We will schedule colonoscopy and check further labs and stool testing Continue with imodium as needed  Follow up 3 months  It was a pleasure to see you today. I want to create trusting relationships with patients and provide genuine, compassionate, and quality care. I truly value your feedback! please be on the lookout for a survey regarding your visit with me today. I appreciate your input about our visit and your time in completing this!    Alyona Romack L. Dorlisa Savino, MSN, APRN, AGNP-C Adult-Gerontology Nurse Practitioner [facility]

## 2023-08-05 DIAGNOSIS — R197 Diarrhea, unspecified: Secondary | ICD-10-CM | POA: Insufficient documentation

## 2023-08-05 DIAGNOSIS — R1033 Periumbilical pain: Secondary | ICD-10-CM | POA: Insufficient documentation

## 2023-08-06 DIAGNOSIS — R634 Abnormal weight loss: Secondary | ICD-10-CM | POA: Diagnosis not present

## 2023-08-06 DIAGNOSIS — R197 Diarrhea, unspecified: Secondary | ICD-10-CM | POA: Diagnosis not present

## 2023-08-10 LAB — CELIAC DISEASE PANEL
(tTG) Ab, IgA: <1 U/mL
(tTG) Ab, IgG: <1 U/mL
Gliadin IgA: <1 U/mL
Gliadin IgG: <1 U/mL
Immunoglobulin A: 258 mg/dL (ref 70–320)

## 2023-08-11 DIAGNOSIS — M7062 Trochanteric bursitis, left hip: Secondary | ICD-10-CM | POA: Diagnosis not present

## 2023-08-12 ENCOUNTER — Telehealth: Payer: Self-pay | Admitting: *Deleted

## 2023-08-12 ENCOUNTER — Encounter: Payer: Self-pay | Admitting: *Deleted

## 2023-08-12 MED ORDER — PEG 3350-KCL-NA BICARB-NACL 420 G PO SOLR: 4000.0000 mL | Freq: Once | ORAL | 0 refills | Status: AC

## 2023-08-12 NOTE — Telephone Encounter (Signed)
 Spoke with pt. He has been scheduled for colonoscopy with Dr. Cinderella, ASA 3 8/15. Aware will call back with pre-op appt, instructions will be sent, rx for prep sent to pharmacy.

## 2023-08-13 ENCOUNTER — Telehealth: Payer: Self-pay

## 2023-08-13 DIAGNOSIS — J849 Interstitial pulmonary disease, unspecified: Secondary | ICD-10-CM | POA: Diagnosis not present

## 2023-08-13 NOTE — Telephone Encounter (Signed)
 Copied from CRM [id number]. Topic: Appointments - Scheduling Inquiry for Clinic >> Aug 12, 2023  3:09 PM Celestine FALCON wrote: Reason for CRM: Pt is calling to schedule 3 mo f/u appt, but the first available was in September. Pt declined and stated he wanted a sooner appt to discuss the pulm fibrosis from recent CT scan. Please call the pt back at [phone] ok to leave a vm.  Spoke to patient. He is requesting sooner appt then September to discuss CT further. Appt scheduled 08/19/2023 with Candis.  Nothing further needed.

## 2023-08-14 ENCOUNTER — Other Ambulatory Visit: Payer: Self-pay | Admitting: Endocrinology

## 2023-08-14 DIAGNOSIS — E042 Nontoxic multinodular goiter: Secondary | ICD-10-CM | POA: Diagnosis not present

## 2023-08-14 DIAGNOSIS — I10 Essential (primary) hypertension: Secondary | ICD-10-CM | POA: Diagnosis not present

## 2023-08-14 DIAGNOSIS — E785 Hyperlipidemia, unspecified: Secondary | ICD-10-CM | POA: Diagnosis not present

## 2023-08-14 DIAGNOSIS — F419 Anxiety disorder, unspecified: Secondary | ICD-10-CM | POA: Diagnosis not present

## 2023-08-14 DIAGNOSIS — I2510 Atherosclerotic heart disease of native coronary artery without angina pectoris: Secondary | ICD-10-CM | POA: Diagnosis not present

## 2023-08-14 DIAGNOSIS — I700 Atherosclerosis of aorta: Secondary | ICD-10-CM | POA: Diagnosis not present

## 2023-08-14 DIAGNOSIS — G4733 Obstructive sleep apnea (adult) (pediatric): Secondary | ICD-10-CM | POA: Diagnosis not present

## 2023-08-14 DIAGNOSIS — R634 Abnormal weight loss: Secondary | ICD-10-CM | POA: Diagnosis not present

## 2023-08-14 DIAGNOSIS — J849 Interstitial pulmonary disease, unspecified: Secondary | ICD-10-CM | POA: Diagnosis not present

## 2023-08-14 DIAGNOSIS — N411 Chronic prostatitis: Secondary | ICD-10-CM | POA: Diagnosis not present

## 2023-08-14 DIAGNOSIS — R002 Palpitations: Secondary | ICD-10-CM | POA: Diagnosis not present

## 2023-08-14 DIAGNOSIS — E669 Obesity, unspecified: Secondary | ICD-10-CM | POA: Diagnosis not present

## 2023-08-15 LAB — FECAL FAT, QUALITATIVE: FECAL FAT, QUALITATIVE: NORMAL

## 2023-08-15 LAB — PANCREATIC ELASTASE, FECAL: Pancreatic Elastase-1, Stool: 158 ug/g — ABNORMAL LOW (ref >200)

## 2023-08-18 ENCOUNTER — Ambulatory Visit (INDEPENDENT_AMBULATORY_CARE_PROVIDER_SITE_OTHER): Payer: Self-pay | Admitting: Gastroenterology

## 2023-08-18 ENCOUNTER — Ambulatory Visit
Admission: RE | Admit: 2023-08-18 | Discharge: 2023-08-18 | Disposition: A | Discharge status: Home / Self Care | Source: Ambulatory Visit | Attending: Endocrinology | Admitting: Endocrinology

## 2023-08-18 DIAGNOSIS — E042 Nontoxic multinodular goiter: Secondary | ICD-10-CM | POA: Diagnosis not present

## 2023-08-18 MED ORDER — PANCRELIPASE (LIP-PROT-AMYL) 36000-114000 UNITS PO CPEP: ORAL_CAPSULE | ORAL | 5 refills | Status: DC

## 2023-08-19 ENCOUNTER — Other Ambulatory Visit (HOSPITAL_COMMUNITY): Payer: Self-pay

## 2023-08-19 ENCOUNTER — Telehealth: Payer: Self-pay

## 2023-08-19 ENCOUNTER — Ambulatory Visit

## 2023-08-19 ENCOUNTER — Telehealth (HOSPITAL_COMMUNITY): Payer: Self-pay

## 2023-08-19 ENCOUNTER — Encounter (HOSPITAL_COMMUNITY): Payer: Self-pay

## 2023-08-19 VITALS — BP 108/56 | HR 58 | Temp 97.6°F | Ht 71.0 in | Wt 224.0 lb

## 2023-08-19 DIAGNOSIS — R197 Diarrhea, unspecified: Secondary | ICD-10-CM | POA: Diagnosis not present

## 2023-08-19 DIAGNOSIS — J849 Interstitial pulmonary disease, unspecified: Secondary | ICD-10-CM

## 2023-08-19 DIAGNOSIS — R0602 Shortness of breath: Secondary | ICD-10-CM

## 2023-08-19 DIAGNOSIS — G4733 Obstructive sleep apnea (adult) (pediatric): Secondary | ICD-10-CM

## 2023-08-19 NOTE — H&P (View-Only) (Signed)
 @Patient  ID: Alexander Nunez, male    DOB: Jan 14, 1946, 78 y.o.   MRN: [id number]  Chief Complaint  Patient presents with   Follow-up    Chest CT f/u    Referring provider: Bertell Satterfield, MD  HPI: Mr. Ludvigsen is a 78 year old male with known history of interstitial lung disease of followed by Dr. Theophilus who presents today for review of his recent chest CT.  He reports that he has been taking Ofev  and Azathrioprine as prescribed but continues to have diarrhea and weight loss.  He is having 6-12 bowel movements per day and has lost 8 pounds since his last appointment with our clinic in June.  Today he reports that he feels as though he needs his oxygen  more often than not.  Oxygen  values have dropped into the 70s with ambulation at home.  Overall he feels that his quality of life has been affected by the diarrhea, poor nutritional intake, and dyspnea as well as a new oxygen  requirement.  We reviewed his new CT images in comparison with his old CT.  This demonstrates progression of his ILD as well as dilation of his pulmonary artery at 4.3 cm.  The progression noted on his CT is consistent with the decline in his pulmonary function testing as well as the new oxygen  requirement.  We also reviewed his most recent compliance.  This demonstrates excellent compliance however he has quite an elevated leak.  This may be related to his weight loss.  Residual AHI is noted to be 1.3/h.  He denies chest pain, dizziness, lightheadedness, fever, chills, night sweats.  TEST/EVENTS :  Chest CT 08/10/2023:  IMPRESSION: Progressive fibrotic changes of the lungs in a pattern inconsistent with UIP. Constellation of findings are likely represent chronic hypersensitivity pneumonitis or fibrosing NSIP. No new suspicious nodule.   Aortic and coronary artery  atherosclerosis (ICD10-I70.0).   Enlarged pulmonary artery suggestive of pulmonary artery hypertension.    Allergies  Allergen Reactions   Cephalexin Other (See  Comments)    Fever    Iodinated Contrast Media Hives, Rash and Dermatitis    Had persistent rash with only 50mg  benadryl  prior to spinal injection on 09/03/2017, try 13 hr prep in future.   Prednisone  Hives and Itching    Immunization History  Administered Date(s) Administered   Influenza, High Dose Seasonal PF 10/11/2018   PFIZER(Purple Top)SARS-COV-2 Vaccination 03/11/2019, 04/01/2019   Pneumococcal Polysaccharide-23 10/10/2016   Tdap 08/14/2010, 07/25/2018    Past Medical History:  Diagnosis Date   Anxiety    Arthritis    back   Back pain, chronic    Colon polyp    Diverticulitis    GERD (gastroesophageal reflux disease)    Heart palpitations    High cholesterol    Hypertension    Irritable bowel syndrome    Osteoporosis    Psoriasis    Pulmonary fibrosis (HCC)    Pulmonary fibrosis (HCC)    Sleep apnea    c- pap    Tobacco History: Social History   Tobacco Use  Smoking Status Former   Current packs/day: 0.00   Average packs/day: 2.0 packs/day for 11.0 years (22.0 ttl pk-yrs)   Types: Cigarettes   Start date: 05/22/1962   Quit date: 05/21/1973   Years since quitting: 50.2  Smokeless Tobacco Never  Tobacco Comments   Quit 25 yrs now    Counseling given: Not Answered Tobacco comments: Quit 25 yrs now    Outpatient Medications Prior to Visit  Medication  Sig Dispense Refill   acetaminophen  (TYLENOL ) 500 MG tablet Take 1,000 mg by mouth every 6 (six) hours as needed for mild pain (pain score 1-3).     albuterol  (VENTOLIN  HFA) 108 (90 Base) MCG/ACT inhaler Inhale 1-2 puffs into the lungs every 4 (four) hours as needed for shortness of breath or wheezing. 6.7 g 5   ALPRAZolam  (XANAX ) 0.5 MG tablet Take 0.25-0.5 mg by mouth 3 (three) times daily as needed for sleep. (Patient taking differently: Take 0.25-0.5 mg by mouth 3 (three) times daily as needed for sleep.  1/2 at bedtime prn.)     azaTHIOprine  (IMURAN ) 50 MG tablet Take 3 tablets (150 mg total) by mouth  daily. 270 tablet 3   doxazosin  (CARDURA ) 4 MG tablet Take 4 mg by mouth at bedtime.       fluticasone  (FLONASE ) 50 MCG/ACT nasal spray Place 2 sprays into both nostrils daily. 16 g 0   guaiFENesin  (MUCINEX ) 600 MG 12 hr tablet Take 600 mg by mouth 2 (two) times daily as needed for to loosen phlegm or cough.     ibuprofen (ADVIL) 200 MG tablet Take 400 mg by mouth every 6 (six) hours as needed for mild pain (pain score 1-3).     lipase/protease/amylase (CREON ) 36000 UNITS CPEP capsule Take 2 capsules (72,000 Units total) by mouth 3 (three) times daily with meals. May also take 1 capsule (36,000 Units total) as needed (with snacks - up to 4 snacks daily). 300 capsule 5   losartan (COZAAR) 25 MG tablet Take 25 mg by mouth daily.     metoprolol  tartrate (LOPRESSOR ) 50 MG tablet Take 25 mg by mouth 2 (two) times daily.     Nintedanib (OFEV ) 150 MG CAPS Take 1 capsule (150 mg total) by mouth 2 (two) times daily. 180 capsule 1   pantoprazole  (PROTONIX ) 40 MG tablet Take 1 tablet (40 mg total) by mouth daily. 30 tablet 1   pravastatin  (PRAVACHOL ) 20 MG tablet Take 20 mg by mouth every evening.      simethicone  (MYLICON) 125 MG chewable tablet Chew 250 mg by mouth every 6 (six) hours as needed for flatulence.     No facility-administered medications prior to visit.     Review of Systems:   Constitutional: Positive for weight loss, night sweats,  Fevers, chills, fatigue, or  lassitude.  HEENT:   No headaches,  Difficulty swallowing,  Tooth/dental problems, or  Sore throat,                No sneezing, itching, ear ache, nasal congestion, post nasal drip,   CV:  No chest pain,  Orthopnea, PND, swelling in lower extremities, anasarca, dizziness, palpitations, syncope.   GI  No heartburn, indigestion, abdominal pain, nausea, vomiting, diarrhea, change in bowel habits, loss of appetite, bloody stools.   Resp: Positive for shortness of breath with exertion or at rest.  No excess mucus, no productive  cough,  No non-productive cough,  No coughing up of blood.  No change in color of mucus.  No wheezing.  No chest wall deformity  Skin: no rash or lesions.  GU: no dysuria, change in color of urine, no urgency or frequency.  No flank pain, no hematuria   MS:  No joint pain or swelling.  No decreased range of motion.  No back pain.    Physical Exam  BP (!) 108/56 (BP Location: Left Arm, Cuff Size: Large)   Pulse (!) 58   Temp 97.6 F (36.4 C)  Ht 5' 11 (1.803 m)   Wt 224 lb (101.6 kg)   SpO2 97% Comment: 3L pulse o2  BMI 31.24 kg/m   GEN: A/Ox3; pleasant , NAD, well nourished.  Wearing oxygen  via nasal cannula   HEENT:  [place]/AT,  EACs-clear, TMs-wnl, NOSE-clear, THROAT-clear, no lesions, no postnasal drip or exudate noted.   NECK:  Supple w/ fair ROM; no JVD; normal carotid impulses w/o bruits; no thyromegaly or nodules palpated; no lymphadenopathy.    RESP  Clear  P & A; w/o, wheezes/ / or rhonchi. no accessory muscle use, no dullness to percussion.  Rales at bases bilaterally  CARD:  RRR, no m/r/g, no peripheral edema, pulses intact, no cyanosis or clubbing.  GI:   Soft & nt; nml bowel sounds; no organomegaly or masses detected.   Musco: Warm bil, no deformities or joint swelling noted.   Neuro: alert, no focal deficits noted.    Skin: Warm, no lesions or rashes    Lab Results:  CBC    Component Value Date/Time   WBC 10.4 07/13/2023 1429   RBC 4.20 (L) 07/13/2023 1429   HGB 13.5 07/13/2023 1429   HCT 40.0 07/13/2023 1429   PLT 320.0 07/13/2023 1429   MCV 95.3 07/13/2023 1429   MCH 32.9 06/11/2023 2033   MCHC 33.8 07/13/2023 1429   RDW 15.1 07/13/2023 1429   LYMPHSABS 2.2 07/13/2023 1429   MONOABS 1.1 (H) 07/13/2023 1429   EOSABS 0.4 07/13/2023 1429   BASOSABS 0.1 07/13/2023 1429    BMET    Component Value Date/Time   NA 139 07/13/2023 1429   K 4.5 07/13/2023 1429   CL 103 07/13/2023 1429   CO2 29 07/13/2023 1429   GLUCOSE 100 (H) 07/13/2023 1429    BUN 14 07/13/2023 1429   CREATININE 1.16 07/13/2023 1429   CREATININE 1.28 11/28/2022 1534   CALCIUM 9.5 07/13/2023 1429   GFRNONAA 56 (L) 06/11/2023 2033   GFRAA >60 03/13/2019 1330    BNP    Component Value Date/Time   BNP 80.0 12/28/2022 0957    ProBNP    Component Value Date/Time   PROBNP 236 01/08/2022 1405    Imaging: CT CHEST HIGH RESOLUTION Result Date: 08/10/2023 CLINICAL DATA:  Interstitial lung disease follow-up EXAM: CT CHEST WITHOUT CONTRAST TECHNIQUE: Multidetector CT imaging of the chest was performed following the standard protocol without intravenous contrast. High resolution imaging of the lungs, as well as inspiratory and expiratory imaging, was performed. RADIATION DOSE REDUCTION: This exam was performed according to the departmental dose-optimization program which includes automated exposure control, adjustment of the mA and/or kV according to patient size and/or use of iterative reconstruction technique. COMPARISON:  Chest CT November 27, 2022 FINDINGS: Cardiovascular: The heart size is normal. Enlarged pulmonary artery measuring 4.3 cm suggestive of pulmonary artery hypertension. No aortic aneurysm. Atherosclerotic calcifications of coronary arteries. Mediastinum/Nodes: Multi station mediastinal subcentimeter and pericentimeter lymph nodes measuring up to 1.1 cm in short axis. Right thyroid  lobe nodule measuring 4 cm, previously measured 3.6 cm. Lungs/Pleura: Interval progression of architectural distortion and fibrotic changes of the lungs, more progressed in lower lobes. Central and peripheral interlobular septal thickening and honeycombing with associated bronchiectasis and bronchiolectasis, mild ground-glass component an architectural distortion. No new consolidation or suspicious pulmonary nodule. Trachea and major airways are otherwise patent. Upper Abdomen: Right hepatic lobe punctate calcification. Cholecystectomy. Subcentimeter splenule. Musculoskeletal: Multilevel  degenerative changes of the spine. IMPRESSION: Progressive fibrotic changes of the lungs in a pattern inconsistent with UIP. Constellation  of findings are likely represent chronic hypersensitivity pneumonitis or fibrosing NSIP. No new suspicious nodule. Aortic and coronary artery  atherosclerosis (ICD10-I70.0). Enlarged pulmonary artery suggestive of pulmonary artery hypertension. Electronically Signed   By: Megan  Zare M.D.   On: 08/10/2023 13:15    Administration History     None          Latest Ref Rng & Units 07/10/2023   10:09 AM 11/13/2022    9:42 AM 01/08/2022   10:47 AM 08/29/2020    8:48 AM 10/03/2019    8:47 AM 12/27/2018    1:33 PM  PFT Results  FVC-Pre L 1.93  2.19  2.23  2.65  2.90  3.03   FVC-Predicted Pre % 44  50  50  59  64  67   FVC-Post L 1.91  2.22  2.29  2.74   3.00   FVC-Predicted Post % 44  50  52  61   66   Pre FEV1/FVC % % 88  90  90  89  85  85   Post FEV1/FCV % % 93  90  89  88   87   FEV1-Pre L 1.70  1.97  2.01  2.36  2.47  2.57   FEV1-Predicted Pre % 54  62  62  72  75  77   FEV1-Post L 1.77  2.00  2.05  2.42   2.61   DLCO uncorrected ml/min/mmHg 9.11  11.54  14.84  19.75  21.35  22.56   DLCO UNC% % 35  44  57  75  81  85   DLCO corrected ml/min/mmHg 9.70  11.54  14.84  19.75  21.35    DLCO COR %Predicted % 37  44  57  75  81    DLVA Predicted % 82  89  100  116  120  125   TLC L 2.69  3.92  3.70  4.55   4.33   TLC % Predicted % 37  54  51  62   59   RV % Predicted % 4  47  52  67   47     No results found for: NITRICOXIDE   Assessment & Plan:   Assessment & Plan Interstitial lung disease (HCC) - Progressing on imaging and on PFTs despite antifibrotic therapy -  Referral to [facility] to evaluate for research study qualifications and potential for alternative therapies -  Through shared decision making with patient and family, decided to keep Ofev  and Azathrioprine at current dosing until he has had full GI evaluation (colonoscopy set to occur in  August).  Consider decreasing Ofev  dose or transitioning to Esbriet.  Esbriet patient information provided to patient in wrap-up. -  Referral for pulmonary rehab -  Wear oxygen  ATC OSA (obstructive sleep apnea) -  Excellent compliance with residual AHI 1.3/h; no change in settings -  Plan for mask fitting given elevated leak profile in the setting of weight loss; ordered Diarrhea, unspecified type -  Currently undergoing workup with GI, but concern is that this is a side effect related to the Ofev  and is affecting his overall quality of life -  Consideration of transition to Esbriet vs. Decreased dose of Ofev  vs. Alternative therapy via [facility] study pending full GI workup -  Increase daily protein intake Dilated pulmonary artery: -  Concern is for [facility]; patient information provided in the wrap- up -  Right heart cath ordered; scheduled for 09/02/2023  Return as scheduled.  Appt in early September 2025 with Dr. Theophilus.  Candis Dandy, PA-C 08/19/2023

## 2023-08-19 NOTE — Patient Instructions (Addendum)
 Referral to [facility] for ILD and consideration of alternative therapies.  Referral for mask fitting; increased leak in the setting of weight loss.  Increase daily protein intake.  Referral for right heart catheterization and potentially ECHO to evaluate for pulmonary hypertension.  Referral for pulmonary rehab.  Keep appt as scheduled in September with Dr. Theophilus.  Wear oxygen  at 3 lpm around the clock.

## 2023-08-19 NOTE — Progress Notes (Signed)
 @Patient  ID: Alexander Nunez, male    DOB: Jan 14, 1946, 78 y.o.   MRN: [id number]  Chief Complaint  Patient presents with   Follow-up    Chest CT f/u    Referring provider: Bertell Satterfield, MD  HPI: Alexander Nunez is a 78 year old male with known history of interstitial lung disease of followed by Dr. Theophilus who presents today for review of his recent chest CT.  He reports that he has been taking Ofev  and Azathrioprine as prescribed but continues to have diarrhea and weight loss.  He is having 6-12 bowel movements per day and has lost 8 pounds since his last appointment with our clinic in June.  Today he reports that he feels as though he needs his oxygen  more often than not.  Oxygen  values have dropped into the 70s with ambulation at home.  Overall he feels that his quality of life has been affected by the diarrhea, poor nutritional intake, and dyspnea as well as a new oxygen  requirement.  We reviewed his new CT images in comparison with his old CT.  This demonstrates progression of his ILD as well as dilation of his pulmonary artery at 4.3 cm.  The progression noted on his CT is consistent with the decline in his pulmonary function testing as well as the new oxygen  requirement.  We also reviewed his most recent compliance.  This demonstrates excellent compliance however he has quite an elevated leak.  This may be related to his weight loss.  Residual AHI is noted to be 1.3/h.  He denies chest pain, dizziness, lightheadedness, fever, chills, night sweats.  TEST/EVENTS :  Chest CT 08/10/2023:  IMPRESSION: Progressive fibrotic changes of the lungs in a pattern inconsistent with UIP. Constellation of findings are likely represent chronic hypersensitivity pneumonitis or fibrosing NSIP. No new suspicious nodule.   Aortic and coronary artery  atherosclerosis (ICD10-I70.0).   Enlarged pulmonary artery suggestive of pulmonary artery hypertension.    Allergies  Allergen Reactions   Cephalexin Other (See  Comments)    Fever    Iodinated Contrast Media Hives, Rash and Dermatitis    Had persistent rash with only 50mg  benadryl  prior to spinal injection on 09/03/2017, try 13 hr prep in future.   Prednisone  Hives and Itching    Immunization History  Administered Date(s) Administered   Influenza, High Dose Seasonal PF 10/11/2018   PFIZER(Purple Top)SARS-COV-2 Vaccination 03/11/2019, 04/01/2019   Pneumococcal Polysaccharide-23 10/10/2016   Tdap 08/14/2010, 07/25/2018    Past Medical History:  Diagnosis Date   Anxiety    Arthritis    back   Back pain, chronic    Colon polyp    Diverticulitis    GERD (gastroesophageal reflux disease)    Heart palpitations    High cholesterol    Hypertension    Irritable bowel syndrome    Osteoporosis    Psoriasis    Pulmonary fibrosis (HCC)    Pulmonary fibrosis (HCC)    Sleep apnea    c- pap    Tobacco History: Social History   Tobacco Use  Smoking Status Former   Current packs/day: 0.00   Average packs/day: 2.0 packs/day for 11.0 years (22.0 ttl pk-yrs)   Types: Cigarettes   Start date: 05/22/1962   Quit date: 05/21/1973   Years since quitting: 50.2  Smokeless Tobacco Never  Tobacco Comments   Quit 25 yrs now    Counseling given: Not Answered Tobacco comments: Quit 25 yrs now    Outpatient Medications Prior to Visit  Medication  Sig Dispense Refill   acetaminophen  (TYLENOL ) 500 MG tablet Take 1,000 mg by mouth every 6 (six) hours as needed for mild pain (pain score 1-3).     albuterol  (VENTOLIN  HFA) 108 (90 Base) MCG/ACT inhaler Inhale 1-2 puffs into the lungs every 4 (four) hours as needed for shortness of breath or wheezing. 6.7 g 5   ALPRAZolam  (XANAX ) 0.5 MG tablet Take 0.25-0.5 mg by mouth 3 (three) times daily as needed for sleep. (Patient taking differently: Take 0.25-0.5 mg by mouth 3 (three) times daily as needed for sleep.  1/2 at bedtime prn.)     azaTHIOprine  (IMURAN ) 50 MG tablet Take 3 tablets (150 mg total) by mouth  daily. 270 tablet 3   doxazosin  (CARDURA ) 4 MG tablet Take 4 mg by mouth at bedtime.       fluticasone  (FLONASE ) 50 MCG/ACT nasal spray Place 2 sprays into both nostrils daily. 16 g 0   guaiFENesin  (MUCINEX ) 600 MG 12 hr tablet Take 600 mg by mouth 2 (two) times daily as needed for to loosen phlegm or cough.     ibuprofen (ADVIL) 200 MG tablet Take 400 mg by mouth every 6 (six) hours as needed for mild pain (pain score 1-3).     lipase/protease/amylase (CREON ) 36000 UNITS CPEP capsule Take 2 capsules (72,000 Units total) by mouth 3 (three) times daily with meals. May also take 1 capsule (36,000 Units total) as needed (with snacks - up to 4 snacks daily). 300 capsule 5   losartan (COZAAR) 25 MG tablet Take 25 mg by mouth daily.     metoprolol  tartrate (LOPRESSOR ) 50 MG tablet Take 25 mg by mouth 2 (two) times daily.     Nintedanib (OFEV ) 150 MG CAPS Take 1 capsule (150 mg total) by mouth 2 (two) times daily. 180 capsule 1   pantoprazole  (PROTONIX ) 40 MG tablet Take 1 tablet (40 mg total) by mouth daily. 30 tablet 1   pravastatin  (PRAVACHOL ) 20 MG tablet Take 20 mg by mouth every evening.      simethicone  (MYLICON) 125 MG chewable tablet Chew 250 mg by mouth every 6 (six) hours as needed for flatulence.     No facility-administered medications prior to visit.     Review of Systems:   Constitutional: Positive for weight loss, night sweats,  Fevers, chills, fatigue, or  lassitude.  HEENT:   No headaches,  Difficulty swallowing,  Tooth/dental problems, or  Sore throat,                No sneezing, itching, ear ache, nasal congestion, post nasal drip,   CV:  No chest pain,  Orthopnea, PND, swelling in lower extremities, anasarca, dizziness, palpitations, syncope.   GI  No heartburn, indigestion, abdominal pain, nausea, vomiting, diarrhea, change in bowel habits, loss of appetite, bloody stools.   Resp: Positive for shortness of breath with exertion or at rest.  No excess mucus, no productive  cough,  No non-productive cough,  No coughing up of blood.  No change in color of mucus.  No wheezing.  No chest wall deformity  Skin: no rash or lesions.  GU: no dysuria, change in color of urine, no urgency or frequency.  No flank pain, no hematuria   MS:  No joint pain or swelling.  No decreased range of motion.  No back pain.    Physical Exam  BP (!) 108/56 (BP Location: Left Arm, Cuff Size: Large)   Pulse (!) 58   Temp 97.6 F (36.4 C)  Ht 5' 11 (1.803 m)   Wt 224 lb (101.6 kg)   SpO2 97% Comment: 3L pulse o2  BMI 31.24 kg/m   GEN: A/Ox3; pleasant , NAD, well nourished.  Wearing oxygen  via nasal cannula   HEENT:  Alexander Nunez,  EACs-clear, TMs-wnl, NOSE-clear, THROAT-clear, no lesions, no postnasal drip or exudate noted.   NECK:  Supple w/ fair ROM; no JVD; normal carotid impulses w/o bruits; no thyromegaly or nodules palpated; no lymphadenopathy.    RESP  Clear  P & A; w/o, wheezes/ / or rhonchi. no accessory muscle use, no dullness to percussion.  Rales at bases bilaterally  CARD:  RRR, no m/r/g, no peripheral edema, pulses intact, no cyanosis or clubbing.  GI:   Soft & nt; nml bowel sounds; no organomegaly or masses detected.   Musco: Warm bil, no deformities or joint swelling noted.   Neuro: alert, no focal deficits noted.    Skin: Warm, no lesions or rashes    Lab Results:  CBC    Component Value Date/Time   WBC 10.4 07/13/2023 1429   RBC 4.20 (L) 07/13/2023 1429   HGB 13.5 07/13/2023 1429   HCT 40.0 07/13/2023 1429   PLT 320.0 07/13/2023 1429   MCV 95.3 07/13/2023 1429   MCH 32.9 06/11/2023 2033   MCHC 33.8 07/13/2023 1429   RDW 15.1 07/13/2023 1429   LYMPHSABS 2.2 07/13/2023 1429   MONOABS 1.1 (H) 07/13/2023 1429   EOSABS 0.4 07/13/2023 1429   BASOSABS 0.1 07/13/2023 1429    BMET    Component Value Date/Time   NA 139 07/13/2023 1429   K 4.5 07/13/2023 1429   CL 103 07/13/2023 1429   CO2 29 07/13/2023 1429   GLUCOSE 100 (H) 07/13/2023 1429    BUN 14 07/13/2023 1429   CREATININE 1.16 07/13/2023 1429   CREATININE 1.28 11/28/2022 1534   CALCIUM 9.5 07/13/2023 1429   GFRNONAA 56 (L) 06/11/2023 2033   GFRAA >60 03/13/2019 1330    BNP    Component Value Date/Time   BNP 80.0 12/28/2022 0957    ProBNP    Component Value Date/Time   PROBNP 236 01/08/2022 1405    Imaging: CT CHEST HIGH RESOLUTION Result Date: 08/10/2023 CLINICAL DATA:  Interstitial lung disease follow-up EXAM: CT CHEST WITHOUT CONTRAST TECHNIQUE: Multidetector CT imaging of the chest was performed following the standard protocol without intravenous contrast. High resolution imaging of the lungs, as well as inspiratory and expiratory imaging, was performed. RADIATION DOSE REDUCTION: This exam was performed according to the departmental dose-optimization program which includes automated exposure control, adjustment of the mA and/or kV according to patient size and/or use of iterative reconstruction technique. COMPARISON:  Chest CT November 27, 2022 FINDINGS: Cardiovascular: The heart size is normal. Enlarged pulmonary artery measuring 4.3 cm suggestive of pulmonary artery hypertension. No aortic aneurysm. Atherosclerotic calcifications of coronary arteries. Mediastinum/Nodes: Multi station mediastinal subcentimeter and pericentimeter lymph nodes measuring up to 1.1 cm in short axis. Right thyroid  lobe nodule measuring 4 cm, previously measured 3.6 cm. Lungs/Pleura: Interval progression of architectural distortion and fibrotic changes of the lungs, more progressed in lower lobes. Central and peripheral interlobular septal thickening and honeycombing with associated bronchiectasis and bronchiolectasis, mild ground-glass component an architectural distortion. No new consolidation or suspicious pulmonary nodule. Trachea and major airways are otherwise patent. Upper Abdomen: Right hepatic lobe punctate calcification. Cholecystectomy. Subcentimeter splenule. Musculoskeletal: Multilevel  degenerative changes of the spine. IMPRESSION: Progressive fibrotic changes of the lungs in a pattern inconsistent with UIP. Constellation  of findings are likely represent chronic hypersensitivity pneumonitis or fibrosing NSIP. No new suspicious nodule. Aortic and coronary artery  atherosclerosis (ICD10-I70.0). Enlarged pulmonary artery suggestive of pulmonary artery hypertension. Electronically Signed   By: Megan  Zare M.D.   On: 08/10/2023 13:15    Administration History     None          Latest Ref Rng & Units 07/10/2023   10:09 AM 11/13/2022    9:42 AM 01/08/2022   10:47 AM 08/29/2020    8:48 AM 10/03/2019    8:47 AM 12/27/2018    1:33 PM  PFT Results  FVC-Pre L 1.93  2.19  2.23  2.65  2.90  3.03   FVC-Predicted Pre % 44  50  50  59  64  67   FVC-Post L 1.91  2.22  2.29  2.74   3.00   FVC-Predicted Post % 44  50  52  61   66   Pre FEV1/FVC % % 88  90  90  89  85  85   Post FEV1/FCV % % 93  90  89  88   87   FEV1-Pre L 1.70  1.97  2.01  2.36  2.47  2.57   FEV1-Predicted Pre % 54  62  62  72  75  77   FEV1-Post L 1.77  2.00  2.05  2.42   2.61   DLCO uncorrected ml/min/mmHg 9.11  11.54  14.84  19.75  21.35  22.56   DLCO UNC% % 35  44  57  75  81  85   DLCO corrected ml/min/mmHg 9.70  11.54  14.84  19.75  21.35    DLCO COR %Predicted % 37  44  57  75  81    DLVA Predicted % 82  89  100  116  120  125   TLC L 2.69  3.92  3.70  4.55   4.33   TLC % Predicted % 37  54  51  62   59   RV % Predicted % 4  47  52  67   47     No results found for: NITRICOXIDE   Assessment & Plan:   Assessment & Plan Interstitial lung disease (HCC) - Progressing on imaging and on PFTs despite antifibrotic therapy -  Referral to [facility] to evaluate for research study qualifications and potential for alternative therapies -  Through shared decision making with patient and family, decided to keep Ofev  and Azathrioprine at current dosing until he has had full GI evaluation (colonoscopy set to occur in  August).  Consider decreasing Ofev  dose or transitioning to Esbriet.  Esbriet patient information provided to patient in wrap-up. -  Referral for pulmonary rehab -  Wear oxygen  ATC OSA (obstructive sleep apnea) -  Excellent compliance with residual AHI 1.3/h; no change in settings -  Plan for mask fitting given elevated leak profile in the setting of weight loss; ordered Diarrhea, unspecified type -  Currently undergoing workup with GI, but concern is that this is a side effect related to the Ofev  and is affecting his overall quality of life -  Consideration of transition to Esbriet vs. Decreased dose of Ofev  vs. Alternative therapy via [facility] study pending full GI workup -  Increase daily protein intake Dilated pulmonary artery: -  Concern is for [facility]; patient information provided in the wrap- up -  Right heart cath ordered; scheduled for 09/02/2023  Return as scheduled.  Appt in early September 2025 with Dr. Theophilus.  Candis Dandy, PA-C 08/19/2023

## 2023-08-19 NOTE — Assessment & Plan Note (Signed)
-    Currently undergoing workup with GI, but concern is that this is a side effect related to the Ofev  and is affecting his overall quality of life -  Consideration of transition to Esbriet vs. Decreased dose of Ofev  vs. Alternative therapy via [facility] study pending full GI workup -  Increase daily protein intake

## 2023-08-19 NOTE — Telephone Encounter (Signed)
 Called patient [facility] scheduled for 09/02/23. Verbal instructions given to patient over phone, patient verbalized understanding. Instructions also mailed to patient

## 2023-08-20 ENCOUNTER — Other Ambulatory Visit: Payer: Self-pay | Admitting: Pulmonary Disease

## 2023-08-21 NOTE — Telephone Encounter (Signed)
 Candis  Thank you  regarding Alexander Nunez  1)  Diarrhea - recommend trying CAROB fLOUR - OTC on [employer] other than drug holoday   Take CAROB Flour for Diarrhea due to medication as follows Take 1 DESSERT spoon  size serving [approximately 7 g] before breakfast If still no response in 3 days then add another 7 g at dinner If still no response in 3 days then make it to spoon servings at breakfast and 2 spoon servings at dinner and hold NOTE: Always MIX the CAROB FLOUR with WATER  or MILK or JUICE - MIGHT NEED A BLENDER to do it DO NOT EAT CAROB POWER DIRECTLY - it can choke or make you cough   2) noticed [facility] being set up later this month - if PAH who-3 then he will NOT qualify for ILD trials -> he will go for [facility] Tyvaso/Yutrepia -> then if he fails There is a study through [facility]  3) if the [facility] does not show PAH, then we wil consider him for TETON 305: a study with Tyvaso vo Placebo as SOC for progressive phenotyype.     Thanks    SIGNATURE    Dr. Dorethia Cave, M.D., F.C.C.P,  Pulmonary and Critical Care Medicine Staff Physician, [facility] Center Director - Interstitial Lung Disease  Program  Pulmonary Fibrosis [facility] at [facility] [place], [place], [address]   Pager: [phone], If no answer  -> Check AMION or Try [phone] Telephone (clinical office): [phone] Telephone (research): [phone]  2:23 PM 08/21/2023        Latest Ref Rng & Units 07/10/2023   10:09 AM 11/13/2022    9:42 AM 01/08/2022   10:47 AM 08/29/2020    8:48 AM 10/03/2019    8:47 AM 12/27/2018    1:33 PM  PFT Results  FVC-Pre L 1.93  2.19  2.23  2.65  2.90  3.03   FVC-Predicted Pre % 44  50  50  59  64  67   FVC-Post L 1.91  2.22  2.29  2.74   3.00   FVC-Predicted Post % 44  50  52  61   66   Pre FEV1/FVC % % 88  90  90  89  85  85   Post FEV1/FCV % % 93  90  89  88   87   FEV1-Pre L 1.70  1.97  2.01  2.36  2.47  2.57    FEV1-Predicted Pre % 54  62  62  72  75  77   FEV1-Post L 1.77  2.00  2.05  2.42   2.61   DLCO uncorrected ml/min/mmHg 9.11  11.54  14.84  19.75  21.35  22.56   DLCO UNC% % 35  44  57  75  81  85   DLCO corrected ml/min/mmHg 9.70  11.54  14.84  19.75  21.35    DLCO COR %Predicted % 37  44  57  75  81    DLVA Predicted % 82  89  100  116  120  125   TLC L 2.69  3.92  3.70  4.55   4.33   TLC % Predicted % 37  54  51  62   59   RV % Predicted % 4  47  52  67   47

## 2023-08-24 ENCOUNTER — Encounter (HOSPITAL_COMMUNITY): Payer: Self-pay

## 2023-08-24 ENCOUNTER — Telehealth: Payer: Self-pay

## 2023-08-24 NOTE — Telephone Encounter (Signed)
 Mychart message:  Good morning-   I spoke with one of the other physicians in the practice, and he had the following recommendation to help with diarrhea.     1)  Diarrhea - recommend trying CAROB fLOUR - OTC on [employer] other than drug holiday     Take CAROB Flour for Diarrhea due to medication as follows Take 1 DESSERT spoon  size serving [approximately 7 g] before breakfast If still no response in 3 days then add another 7 g at dinner If still no response in 3 days then make it to spoon servings at breakfast and 2 spoon servings at dinner and hold NOTE: Always MIX the CAROB FLOUR with WATER  or MILK or JUICE - MIGHT NEED A BLENDER to do it DO NOT EAT CAROB POWER DIRECTLY - it can choke or make you cough   With respect to the referral to [facility], this will depend on the results of your right heart cath which I see you are scheduled for.  We will be in touch.   Candis

## 2023-08-25 ENCOUNTER — Encounter (INDEPENDENT_AMBULATORY_CARE_PROVIDER_SITE_OTHER): Payer: Self-pay | Admitting: Gastroenterology

## 2023-08-25 ENCOUNTER — Ambulatory Visit (INDEPENDENT_AMBULATORY_CARE_PROVIDER_SITE_OTHER): Admitting: Gastroenterology

## 2023-08-25 VITALS — BP 104/63 | HR 71 | Temp 97.6°F | Ht 71.0 in | Wt 222.8 lb

## 2023-08-25 DIAGNOSIS — K602 Anal fissure, unspecified: Secondary | ICD-10-CM | POA: Insufficient documentation

## 2023-08-25 DIAGNOSIS — K8681 Exocrine pancreatic insufficiency: Secondary | ICD-10-CM | POA: Diagnosis not present

## 2023-08-25 DIAGNOSIS — R197 Diarrhea, unspecified: Secondary | ICD-10-CM | POA: Diagnosis not present

## 2023-08-25 DIAGNOSIS — K6289 Other specified diseases of anus and rectum: Secondary | ICD-10-CM | POA: Diagnosis not present

## 2023-08-25 NOTE — H&P (View-Only) (Signed)
 Referring Provider: Bertell Satterfield, MD Primary Care Physician:  Bertell Satterfield, MD Primary GI Physician: Dr. Cinderella   Chief Complaint  Patient presents with   Hemorrhoids    Pt arrives due to possible hemorrhoid. Pt is having pain. No bleeding at this time.    HPI:   Alexander Nunez is a 78 y.o. male with past medical history of anxiety, arthritis, diverticulitis, GERD, high cholesterol, HTN, IBS, osteoporosis, psoriasis, pulmonary fibrosis, sleep apnea, anal fissure   Patient presenting today for:  Rectal pain and follow up of diarrhea thought secondary to EPI  Last seen July 1st, 2025, at that time having worsening diarrhea, weight loss , periumbilical pain, bloating and gas.   Recommended fecal fat, pancreatic elastase, celiac panel, schedule colonoscopy, SIBO testing if other investigations negative, continue imodium PRN  Labs 08/06/23: Fecal fat normal, celiac panel negative, pancreatic elastase 158, started on creon    Present:  Reports some pain in his rectum and even into his tailbone for about 1 month. Sometimes pain is sharp. He denies any bleeding, itching. He notes that he has some burning when he has a BM. He reports history of fissure in the past (2022). Denies any straining or hard stools at all. He has been using the CA compund cream with diltiazem that he got back in 2022 when he had an anal fissure, has noticed some mild improvement.   He started creon  and notes initially he felt some improvement. He is taking creon  before he eats. Stools are still somewhat watery with some solid mixed in, he is averaging about 2-4 stools per day. He is gassy. Continues to lose weight.    CT A/P without contrast: 12/2022 Diverticulosis of descending and sigmoid colon without inflammation. Small amount of free fluid is noted in the pelvis of uncertain etiology. No other acute abnormality seen in the abdomen or pelvis. No changes of pancreatitis or inflammatory changes of  pancreas EGD: 05/2023 - Normal esophagus.                           - Gastritis. Biopsied.                           - Normal duodenal bulb and second portion of the                            duodenum.A. STOMACH, RANDOM, BIOPSY:  Gastric antral / oxyntic mucosa without significant diagnostic  alteration.  No H. pylori identified on HE stain.  Negative for intestinal metaplasia or dysplasia.  Last Colonoscopy:02/2017 internal hemorrhoids, diverticulosis   Past Medical History:  Diagnosis Date   Anxiety    Arthritis    back   Back pain, chronic    Colon polyp    Diverticulitis    GERD (gastroesophageal reflux disease)    Heart palpitations    High cholesterol    Hypertension    Irritable bowel syndrome    Osteoporosis    Psoriasis    Pulmonary fibrosis (HCC)    Pulmonary fibrosis (HCC)    Sleep apnea    c- pap    Past Surgical History:  Procedure Laterality Date   BACK SURGERY  2015   CHOLECYSTECTOMY N/A 10/12/2015   Procedure: LAPAROSCOPIC CHOLECYSTECTOMY;  Surgeon: Oneil Budge, MD;  Location: [facility];  Service: General;  Laterality: N/A;   COLONOSCOPY  ESOPHAGOGASTRODUODENOSCOPY N/A 05/07/2023   Procedure: EGD (ESOPHAGOGASTRODUODENOSCOPY);  Surgeon: Cinderella Deatrice FALCON, MD;  Location: [facility];  Service: Endoscopy;  Laterality: N/A;  2:00PM;ASA 3   FOOT FRACTURE SURGERY     left foot   KNEE ARTHROSCOPY     left   TONSILLECTOMY     UPPER GASTROINTESTINAL ENDOSCOPY      Current Outpatient Medications  Medication Sig Dispense Refill   acetaminophen  (TYLENOL ) 500 MG tablet Take 1,000 mg by mouth every 6 (six) hours as needed for mild pain (pain score 1-3).     albuterol  (VENTOLIN  HFA) 108 (90 Base) MCG/ACT inhaler Inhale 1-2 puffs into the lungs every 4 (four) hours as needed for shortness of breath or wheezing. 6.7 g 5   ALPRAZolam  (XANAX ) 0.5 MG tablet Take 0.25-0.5 mg by mouth 3 (three) times daily as needed for sleep. (Patient taking differently: Take 0.25-0.5 mg  by mouth 3 (three) times daily as needed for sleep.  1/2 at bedtime prn.)     azaTHIOprine  (IMURAN ) 50 MG tablet Take 3 tablets (150 mg total) by mouth daily. 270 tablet 3   doxazosin  (CARDURA ) 4 MG tablet Take 4 mg by mouth at bedtime.       fluticasone  (FLONASE ) 50 MCG/ACT nasal spray Place 2 sprays into both nostrils daily. 16 g 0   guaiFENesin  (MUCINEX ) 600 MG 12 hr tablet Take 600 mg by mouth 2 (two) times daily as needed for to loosen phlegm or cough.     ibuprofen (ADVIL) 200 MG tablet Take 400 mg by mouth every 6 (six) hours as needed for mild pain (pain score 1-3).     lipase/protease/amylase (CREON ) 36000 UNITS CPEP capsule Take 2 capsules (72,000 Units total) by mouth 3 (three) times daily with meals. May also take 1 capsule (36,000 Units total) as needed (with snacks - up to 4 snacks daily). 300 capsule 5   losartan (COZAAR) 25 MG tablet Take 25 mg by mouth daily.     metoprolol  tartrate (LOPRESSOR ) 50 MG tablet Take 25 mg by mouth 2 (two) times daily.     Nintedanib (OFEV ) 150 MG CAPS Take 1 capsule (150 mg total) by mouth 2 (two) times daily. 180 capsule 1   pantoprazole  (PROTONIX ) 40 MG tablet Take 1 tablet (40 mg total) by mouth daily. 30 tablet 1   pravastatin  (PRAVACHOL ) 20 MG tablet Take 20 mg by mouth every evening.      simethicone  (MYLICON) 125 MG chewable tablet Chew 250 mg by mouth every 6 (six) hours as needed for flatulence.     No current facility-administered medications for this visit.    Allergies as of 08/25/2023 - Review Complete 08/25/2023  Allergen Reaction Noted   Cephalexin Other (See Comments) 03/07/2008   Iodinated contrast media Hives, Rash, and Dermatitis 09/17/2011   Prednisone  Hives and Itching 08/19/2023    Social History   Socioeconomic History   Marital status: Married    Spouse name: Not on file   Number of children: 2   Years of education: Not on file   Highest education level: Not on file  Occupational History   Occupation: retired     Comment: Retired  Tobacco Use   Smoking status: Former    Current packs/day: 0.00    Average packs/day: 2.0 packs/day for 11.0 years (22.0 ttl pk-yrs)    Types: Cigarettes    Start date: 05/22/1962    Quit date: 05/21/1973    Years since quitting: 50.2   Smokeless tobacco: Never  Tobacco comments:    Quit 25 yrs now   Vaping Use   Vaping status: Never Used  Substance and Sexual Activity   Alcohol use: Yes    Alcohol/week: 14.0 standard drinks of alcohol    Types: 14 Shots of liquor per week    Comment: 1 drink daily   Drug use: No   Sexual activity: Yes    Partners: Female  Other Topics Concern   Not on file  Social History Narrative   Has dog named Advice worker   Social Drivers of Corporate investment banker Strain: Not on file  Food Insecurity: Not on file  Transportation Needs: Not on file  Physical Activity: Not on file  Stress: Not on file  Social Connections: Not on file    Review of systems General: negative for malaise, night sweats, fever, chills, weight loss Neck: Negative for lumps, goiter, pain and significant neck swelling Resp: Negative for cough, wheezing, dyspnea at rest CV: Negative for chest pain, leg swelling, palpitations, orthopnea GI: denies melena, hematochezia, nausea, vomiting, constipation, dysphagia, odyonophagia, early satiety or unintentional weight loss. +diarrhea +rectal pain MSK: Negative for joint pain or swelling, back pain, and muscle pain. Derm: Negative for itching or rash Psych: Denies depression, anxiety, memory loss, confusion. No homicidal or suicidal ideation.  Neuro: negative for tremor, gait imbalance, syncope and seizures. The remainder of the review of systems is noncontributory.  Physical Exam: There were no vitals taken for this visit. General:   Alert and oriented. No distress noted. Pleasant and cooperative.  Head:  Normocephalic and atraumatic. Eyes:  Conjuctiva clear without scleral icterus. Mouth:  Oral mucosa  pink and moist. Good dentition. No lesions. Heart: Normal rate and rhythm, s1 and s2 heart sounds present.  Lungs: Clear lung sounds in all lobes. Respirations equal and unlabored. Abdomen:  +BS, soft, non-tender and non-distended. No rebound or guarding. No HSM or masses noted. Rectal: Glenys bruns, LPN present as witness. mildly erythematous around perianal area, small, purplish area which appears to be a possible healing fissure at the 6'clock location (left lateral) just inside the anal opening. No DRE done due to patient discomfort (upcoming colonoscopy next month) Derm: No palmar erythema or jaundice Neurologic:  Alert and  oriented x4 Psych:  Alert and cooperative. Normal mood and affect.  Invalid input(s): 6 MONTHS   ASSESSMENT: OSMAR HOWTON is a 78 y.o. male presenting today for follow up of diarrhea thought secondary to EPI and rectal pain   Diarrhea thought secondary to EPI: pancreatic elastase 158, started on creon  72k units with meals 36kunits with snacks after his visit in early July, has had some improvement but continues to have about 2-3 episodes of diarrhea per day, as well as decline in weight. Colonoscopy scheduled for 8/15. Given his weight, we have room to increase creon , will go up to 108k units with meals and 72k units with snacks, I also reiterated to him to make sure he is taking the creon  while he is eating for best results.   Rectal pain: x1 month, some improvement with use of some CA hemorrhoid compound cream with diltiazem he had on hand from 2022 for history of anal fissure. On exam, appears to have a somewhat healed fissure at [facility] clock location with some pain with palpation of the area. Suspect the cream he is using is likely old though may have provided some improvement. Will send a refill of this for him to use 3-4 times per day x 7-10  days. Should keep rectal area clean and dry, avoid wet wipes. He does have TCS scheduled for 8/15 as well, which he should keep.     PLAN:  -CA hemorrhoid compound cream with 2% diltiazem, 3-4 times per day  -Keep rectal area clean and dry, avoid wet wipes -Increase creon  to 108k units with meals 76k units with snacks, make sure to take during meal -keep scheduled TCS on 8/15 with random colonic biopsies  All questions were answered, patient verbalized understanding and is in agreement with plan as outlined above.   Follow Up: Cancel October appt, follow up in 6-8 weeks   Alexander Roehrs L. Alyscia Carmon, MSN, APRN, AGNP-C Adult-Gerontology Nurse Practitioner [facility]

## 2023-08-25 NOTE — Progress Notes (Signed)
 Referring Provider: Bertell Satterfield, MD Primary Care Physician:  Bertell Satterfield, MD Primary GI Physician: Dr. Cinderella   Chief Complaint  Patient presents with   Hemorrhoids    Pt arrives due to possible hemorrhoid. Pt is having pain. No bleeding at this time.    HPI:   Alexander Nunez is a 78 y.o. male with past medical history of anxiety, arthritis, diverticulitis, GERD, high cholesterol, HTN, IBS, osteoporosis, psoriasis, pulmonary fibrosis, sleep apnea, anal fissure   Patient presenting today for:  Rectal pain and follow up of diarrhea thought secondary to EPI  Last seen July 1st, 2025, at that time having worsening diarrhea, weight loss , periumbilical pain, bloating and gas.   Recommended fecal fat, pancreatic elastase, celiac panel, schedule colonoscopy, SIBO testing if other investigations negative, continue imodium PRN  Labs 08/06/23: Fecal fat normal, celiac panel negative, pancreatic elastase 158, started on creon    Present:  Reports some pain in his rectum and even into his tailbone for about 1 month. Sometimes pain is sharp. He denies any bleeding, itching. He notes that he has some burning when he has a BM. He reports history of fissure in the past (2022). Denies any straining or hard stools at all. He has been using the CA compund cream with diltiazem that he got back in 2022 when he had an anal fissure, has noticed some mild improvement.   He started creon  and notes initially he felt some improvement. He is taking creon  before he eats. Stools are still somewhat watery with some solid mixed in, he is averaging about 2-4 stools per day. He is gassy. Continues to lose weight.    CT A/P without contrast: 12/2022 Diverticulosis of descending and sigmoid colon without inflammation. Small amount of free fluid is noted in the pelvis of uncertain etiology. No other acute abnormality seen in the abdomen or pelvis. No changes of pancreatitis or inflammatory changes of  pancreas EGD: 05/2023 - Normal esophagus.                           - Gastritis. Biopsied.                           - Normal duodenal bulb and second portion of the                            duodenum.A. STOMACH, RANDOM, BIOPSY:  Gastric antral / oxyntic mucosa without significant diagnostic  alteration.  No H. pylori identified on HE stain.  Negative for intestinal metaplasia or dysplasia.  Last Colonoscopy:02/2017 internal hemorrhoids, diverticulosis   Past Medical History:  Diagnosis Date   Anxiety    Arthritis    back   Back pain, chronic    Colon polyp    Diverticulitis    GERD (gastroesophageal reflux disease)    Heart palpitations    High cholesterol    Hypertension    Irritable bowel syndrome    Osteoporosis    Psoriasis    Pulmonary fibrosis (HCC)    Pulmonary fibrosis (HCC)    Sleep apnea    c- pap    Past Surgical History:  Procedure Laterality Date   BACK SURGERY  2015   CHOLECYSTECTOMY N/A 10/12/2015   Procedure: LAPAROSCOPIC CHOLECYSTECTOMY;  Surgeon: Oneil Budge, MD;  Location: [facility];  Service: General;  Laterality: N/A;   COLONOSCOPY  ESOPHAGOGASTRODUODENOSCOPY N/A 05/07/2023   Procedure: EGD (ESOPHAGOGASTRODUODENOSCOPY);  Surgeon: Cinderella Deatrice FALCON, MD;  Location: [facility];  Service: Endoscopy;  Laterality: N/A;  2:00PM;ASA 3   FOOT FRACTURE SURGERY     left foot   KNEE ARTHROSCOPY     left   TONSILLECTOMY     UPPER GASTROINTESTINAL ENDOSCOPY      Current Outpatient Medications  Medication Sig Dispense Refill   acetaminophen  (TYLENOL ) 500 MG tablet Take 1,000 mg by mouth every 6 (six) hours as needed for mild pain (pain score 1-3).     albuterol  (VENTOLIN  HFA) 108 (90 Base) MCG/ACT inhaler Inhale 1-2 puffs into the lungs every 4 (four) hours as needed for shortness of breath or wheezing. 6.7 g 5   ALPRAZolam  (XANAX ) 0.5 MG tablet Take 0.25-0.5 mg by mouth 3 (three) times daily as needed for sleep. (Patient taking differently: Take 0.25-0.5 mg  by mouth 3 (three) times daily as needed for sleep.  1/2 at bedtime prn.)     azaTHIOprine  (IMURAN ) 50 MG tablet Take 3 tablets (150 mg total) by mouth daily. 270 tablet 3   doxazosin  (CARDURA ) 4 MG tablet Take 4 mg by mouth at bedtime.       fluticasone  (FLONASE ) 50 MCG/ACT nasal spray Place 2 sprays into both nostrils daily. 16 g 0   guaiFENesin  (MUCINEX ) 600 MG 12 hr tablet Take 600 mg by mouth 2 (two) times daily as needed for to loosen phlegm or cough.     ibuprofen (ADVIL) 200 MG tablet Take 400 mg by mouth every 6 (six) hours as needed for mild pain (pain score 1-3).     lipase/protease/amylase (CREON ) 36000 UNITS CPEP capsule Take 2 capsules (72,000 Units total) by mouth 3 (three) times daily with meals. May also take 1 capsule (36,000 Units total) as needed (with snacks - up to 4 snacks daily). 300 capsule 5   losartan (COZAAR) 25 MG tablet Take 25 mg by mouth daily.     metoprolol  tartrate (LOPRESSOR ) 50 MG tablet Take 25 mg by mouth 2 (two) times daily.     Nintedanib (OFEV ) 150 MG CAPS Take 1 capsule (150 mg total) by mouth 2 (two) times daily. 180 capsule 1   pantoprazole  (PROTONIX ) 40 MG tablet Take 1 tablet (40 mg total) by mouth daily. 30 tablet 1   pravastatin  (PRAVACHOL ) 20 MG tablet Take 20 mg by mouth every evening.      simethicone  (MYLICON) 125 MG chewable tablet Chew 250 mg by mouth every 6 (six) hours as needed for flatulence.     No current facility-administered medications for this visit.    Allergies as of 08/25/2023 - Review Complete 08/25/2023  Allergen Reaction Noted   Cephalexin Other (See Comments) 03/07/2008   Iodinated contrast media Hives, Rash, and Dermatitis 09/17/2011   Prednisone  Hives and Itching 08/19/2023    Social History   Socioeconomic History   Marital status: Married    Spouse name: Not on file   Number of children: 2   Years of education: Not on file   Highest education level: Not on file  Occupational History   Occupation: retired     Comment: Retired  Tobacco Use   Smoking status: Former    Current packs/day: 0.00    Average packs/day: 2.0 packs/day for 11.0 years (22.0 ttl pk-yrs)    Types: Cigarettes    Start date: 05/22/1962    Quit date: 05/21/1973    Years since quitting: 50.2   Smokeless tobacco: Never  Tobacco comments:    Quit 25 yrs now   Vaping Use   Vaping status: Never Used  Substance and Sexual Activity   Alcohol use: Yes    Alcohol/week: 14.0 standard drinks of alcohol    Types: 14 Shots of liquor per week    Comment: 1 drink daily   Drug use: No   Sexual activity: Yes    Partners: Female  Other Topics Concern   Not on file  Social History Narrative   Has dog named Advice worker   Social Drivers of Corporate investment banker Strain: Not on file  Food Insecurity: Not on file  Transportation Needs: Not on file  Physical Activity: Not on file  Stress: Not on file  Social Connections: Not on file    Review of systems General: negative for malaise, night sweats, fever, chills, weight loss Neck: Negative for lumps, goiter, pain and significant neck swelling Resp: Negative for cough, wheezing, dyspnea at rest CV: Negative for chest pain, leg swelling, palpitations, orthopnea GI: denies melena, hematochezia, nausea, vomiting, constipation, dysphagia, odyonophagia, early satiety or unintentional weight loss. +diarrhea +rectal pain MSK: Negative for joint pain or swelling, back pain, and muscle pain. Derm: Negative for itching or rash Psych: Denies depression, anxiety, memory loss, confusion. No homicidal or suicidal ideation.  Neuro: negative for tremor, gait imbalance, syncope and seizures. The remainder of the review of systems is noncontributory.  Physical Exam: There were no vitals taken for this visit. General:   Alert and oriented. No distress noted. Pleasant and cooperative.  Head:  Normocephalic and atraumatic. Eyes:  Conjuctiva clear without scleral icterus. Mouth:  Oral mucosa  pink and moist. Good dentition. No lesions. Heart: Normal rate and rhythm, s1 and s2 heart sounds present.  Lungs: Clear lung sounds in all lobes. Respirations equal and unlabored. Abdomen:  +BS, soft, non-tender and non-distended. No rebound or guarding. No HSM or masses noted. Rectal: Glenys bruns, LPN present as witness. mildly erythematous around perianal area, small, purplish area which appears to be a possible healing fissure at the 6'clock location (left lateral) just inside the anal opening. No DRE done due to patient discomfort (upcoming colonoscopy next month) Derm: No palmar erythema or jaundice Neurologic:  Alert and  oriented x4 Psych:  Alert and cooperative. Normal mood and affect.  Invalid input(s): 6 MONTHS   ASSESSMENT: BAWI LAKINS is a 78 y.o. male presenting today for follow up of diarrhea thought secondary to EPI and rectal pain   Diarrhea thought secondary to EPI: pancreatic elastase 158, started on creon  72k units with meals 36kunits with snacks after his visit in early July, has had some improvement but continues to have about 2-3 episodes of diarrhea per day, as well as decline in weight. Colonoscopy scheduled for 8/15. Given his weight, we have room to increase creon , will go up to 108k units with meals and 72k units with snacks, I also reiterated to him to make sure he is taking the creon  while he is eating for best results.   Rectal pain: x1 month, some improvement with use of some CA hemorrhoid compound cream with diltiazem he had on hand from 2022 for history of anal fissure. On exam, appears to have a somewhat healed fissure at [facility] clock location with some pain with palpation of the area. Suspect the cream he is using is likely old though may have provided some improvement. Will send a refill of this for him to use 3-4 times per day x 7-10  days. Should keep rectal area clean and dry, avoid wet wipes. He does have TCS scheduled for 8/15 as well, which he should keep.     PLAN:  -CA hemorrhoid compound cream with 2% diltiazem, 3-4 times per day  -Keep rectal area clean and dry, avoid wet wipes -Increase creon  to 108k units with meals 76k units with snacks, make sure to take during meal -keep scheduled TCS on 8/15 with random colonic biopsies  All questions were answered, patient verbalized understanding and is in agreement with plan as outlined above.   Follow Up: Cancel October appt, follow up in 6-8 weeks   Alexander Reining L. Garrick Midgley, MSN, APRN, AGNP-C Adult-Gerontology Nurse Practitioner [facility]

## 2023-08-25 NOTE — Patient Instructions (Addendum)
-  Please increase creon  to 108k units (3 capsules) with meals and 2 with snacks to see if this improves your diarrhea -we will send a refill of the [employer] for the anal fissure, you can apply this 3-4 times per day x7-10 days -keep rectal area clean and dry  Follow up in about 6-8 weeks  It was a pleasure to see you today. I want to create trusting relationships with patients and provide genuine, compassionate, and quality care. I truly value your feedback! please be on the lookout for a survey regarding your visit with me today. I appreciate your input about our visit and your time in completing this!    Alyssah Algeo L. Symantha Steeber, MSN, APRN, AGNP-C Adult-Gerontology Nurse Practitioner [facility] at [facility]

## 2023-08-26 ENCOUNTER — Other Ambulatory Visit: Payer: Self-pay

## 2023-08-26 DIAGNOSIS — J849 Interstitial pulmonary disease, unspecified: Secondary | ICD-10-CM

## 2023-08-26 NOTE — Progress Notes (Signed)
 @Patient  ID: Alexander Nunez, male    DOB: 10-06-1945, 78 y.o.   MRN: [id number]  No chief complaint on file.   Referring provider: No ref. provider found  HPI:   TEST/EVENTS :   Allergies  Allergen Reactions   Cephalexin Other (See Comments)    Fever    Iodinated Contrast Media Hives, Rash and Dermatitis    Had persistent rash with only 50mg  benadryl  prior to spinal injection on 09/03/2017, try 13 hr prep in future.   Prednisone  Hives and Itching    Immunization History  Administered Date(s) Administered   Influenza, High Dose Seasonal PF 10/11/2018   PFIZER(Purple Top)SARS-COV-2 Vaccination 03/11/2019, 04/01/2019   Pneumococcal Polysaccharide-23 10/10/2016   Tdap 08/14/2010, 07/25/2018    Past Medical History:  Diagnosis Date   Anxiety    Arthritis    back   Back pain, chronic    Colon polyp    Diverticulitis    GERD (gastroesophageal reflux disease)    Heart palpitations    High cholesterol    Hypertension    Irritable bowel syndrome    Osteoporosis    Psoriasis    Pulmonary fibrosis (HCC)    Pulmonary fibrosis (HCC)    Sleep apnea    c- pap    Tobacco History: Social History   Tobacco Use  Smoking Status Former   Current packs/day: 0.00   Average packs/day: 2.0 packs/day for 11.0 years (22.0 ttl pk-yrs)   Types: Cigarettes   Start date: 05/22/1962   Quit date: 05/21/1973   Years since quitting: 50.2  Smokeless Tobacco Never  Tobacco Comments   Quit 25 yrs now    Counseling given: Not Answered Tobacco comments: Quit 25 yrs now    Outpatient Medications Prior to Visit  Medication Sig Dispense Refill   acetaminophen  (TYLENOL ) 500 MG tablet Take 1,000 mg by mouth every 6 (six) hours as needed for mild pain (pain score 1-3).     albuterol  (VENTOLIN  HFA) 108 (90 Base) MCG/ACT inhaler Inhale 1-2 puffs into the lungs every 4 (four) hours as needed for shortness of breath or wheezing. 6.7 g 5   ALPRAZolam  (XANAX ) 0.5 MG tablet Take 0.25-0.5 mg by  mouth 3 (three) times daily as needed for sleep. (Patient taking differently: Take 0.25-0.5 mg by mouth 3 (three) times daily as needed for sleep.  1/2 at bedtime prn.)     azaTHIOprine  (IMURAN ) 50 MG tablet Take 3 tablets (150 mg total) by mouth daily. 270 tablet 3   doxazosin  (CARDURA ) 4 MG tablet Take 4 mg by mouth at bedtime.       fluticasone  (FLONASE ) 50 MCG/ACT nasal spray Place 2 sprays into both nostrils daily. 16 g 0   guaiFENesin  (MUCINEX ) 600 MG 12 hr tablet Take 600 mg by mouth 2 (two) times daily as needed for to loosen phlegm or cough.     ibuprofen (ADVIL) 200 MG tablet Take 400 mg by mouth every 6 (six) hours as needed for mild pain (pain score 1-3).     lipase/protease/amylase (CREON ) 36000 UNITS CPEP capsule Take 2 capsules (72,000 Units total) by mouth 3 (three) times daily with meals. May also take 1 capsule (36,000 Units total) as needed (with snacks - up to 4 snacks daily). 300 capsule 5   losartan (COZAAR) 25 MG tablet Take 25 mg by mouth daily.     metoprolol  tartrate (LOPRESSOR ) 50 MG tablet Take 25 mg by mouth 2 (two) times daily.     Nintedanib (OFEV ) 150  MG CAPS Take 1 capsule (150 mg total) by mouth 2 (two) times daily. 180 capsule 1   pantoprazole  (PROTONIX ) 40 MG tablet Take 1 tablet (40 mg total) by mouth daily. 30 tablet 1   pravastatin  (PRAVACHOL ) 20 MG tablet Take 20 mg by mouth every evening.      simethicone  (MYLICON) 125 MG chewable tablet Chew 250 mg by mouth every 6 (six) hours as needed for flatulence.     No facility-administered medications prior to visit.     Review of Systems:   Constitutional:   No  weight loss, night sweats,  Fevers, chills, fatigue, or  lassitude.  HEENT:   No headaches,  Difficulty swallowing,  Tooth/dental problems, or  Sore throat,                No sneezing, itching, ear ache, nasal congestion, post nasal drip,   CV:  No chest pain,  Orthopnea, PND, swelling in lower extremities, anasarca, dizziness, palpitations, syncope.    GI  No heartburn, indigestion, abdominal pain, nausea, vomiting, diarrhea, change in bowel habits, loss of appetite, bloody stools.   Resp: No shortness of breath with exertion or at rest.  No excess mucus, no productive cough,  No non-productive cough,  No coughing up of blood.  No change in color of mucus.  No wheezing.  No chest wall deformity  Skin: no rash or lesions.  GU: no dysuria, change in color of urine, no urgency or frequency.  No flank pain, no hematuria   MS:  No joint pain or swelling.  No decreased range of motion.  No back pain.    Physical Exam  There were no vitals taken for this visit.  GEN: A/Ox3; pleasant , NAD, well nourished    HEENT:  [place]/AT,  EACs-clear, TMs-wnl, NOSE-clear, THROAT-clear, no lesions, no postnasal drip or exudate noted.   NECK:  Supple w/ fair ROM; no JVD; normal carotid impulses w/o bruits; no thyromegaly or nodules palpated; no lymphadenopathy.    RESP  Clear  P & A; w/o, wheezes/ rales/ or rhonchi. no accessory muscle use, no dullness to percussion  CARD:  RRR, no m/r/g, no peripheral edema, pulses intact, no cyanosis or clubbing.  GI:   Soft & nt; nml bowel sounds; no organomegaly or masses detected.   Musco: Warm bil, no deformities or joint swelling noted.   Neuro: alert, no focal deficits noted.    Skin: Warm, no lesions or rashes    Lab Results:  CBC    Component Value Date/Time   WBC 10.4 07/13/2023 1429   RBC 4.20 (L) 07/13/2023 1429   HGB 13.5 07/13/2023 1429   HCT 40.0 07/13/2023 1429   PLT 320.0 07/13/2023 1429   MCV 95.3 07/13/2023 1429   MCH 32.9 06/11/2023 2033   MCHC 33.8 07/13/2023 1429   RDW 15.1 07/13/2023 1429   LYMPHSABS 2.2 07/13/2023 1429   MONOABS 1.1 (H) 07/13/2023 1429   EOSABS 0.4 07/13/2023 1429   BASOSABS 0.1 07/13/2023 1429    BMET    Component Value Date/Time   NA 139 07/13/2023 1429   K 4.5 07/13/2023 1429   CL 103 07/13/2023 1429   CO2 29 07/13/2023 1429   GLUCOSE 100 (H)  07/13/2023 1429   BUN 14 07/13/2023 1429   CREATININE 1.16 07/13/2023 1429   CREATININE 1.28 11/28/2022 1534   CALCIUM 9.5 07/13/2023 1429   GFRNONAA 56 (L) 06/11/2023 2033   GFRAA >60 03/13/2019 1330    BNP  Component Value Date/Time   BNP 80.0 12/28/2022 0957    ProBNP    Component Value Date/Time   PROBNP 236 01/08/2022 1405    Imaging: US  THYROID  Result Date: 08/23/2023 CLINICAL DATA:  Nontoxic multinodular goiter . previous FNA biopsy right nodule 12/17/2015 EXAM: THYROID  ULTRASOUND TECHNIQUE: Ultrasound examination of the thyroid  gland and adjacent soft tissues was performed. COMPARISON:  09/12/2021 and previous FINDINGS: Parenchymal Echotexture: Markedly heterogenous Isthmus: 0.5 cm thickness, previously 0.4 Right lobe: 5.6 x 2.9 x 3.5 cm, previously 5.8 x 3.3 x 3.5 Left lobe: 5.2 x 2 cm, previously 5.4 x 2.1 x 1.6 _________________________________________________________ Estimated total number of nodules >/= 1 cm: 3 Number of spongiform nodules >/=  2 cm not described below (TR1): 0 Number of mixed cystic and solid nodules >/= 1.5 cm not described below (TR2): 0 _________________________________________________________ Nodule # 1: Prior biopsy: No Location: Isthmus; right of midline Maximum size: 1.4 cm; Other 2 dimensions: 1 x 1.3 cm, previously, 1.7 x 0.9 x 1 on 02/27/2017 ACR TI-RADS recommendations: Stability for greater than 5 years implies benignity. No follow-up recommended. _________________________________________________________ Nodule # 2: Prior biopsy: Yes Location: Right; inferior Maximum size: 4 cm; Other 2 dimensions: 3.6 x 2.8 cm, previously, 3.9 x 3.1 x 3.1 cm ACR TI-RADS recommendations: Previously biopsied. Assuming negative cytology, no follow-up recommended. _________________________________________________________ Nodule # 3: Location: Left; mid Maximum size: 1.3 cm; Other 2 dimensions: 1 x 1.2 cm Composition: solid/almost completely solid (2) Echogenicity:  isoechoic (1) Shape: not taller-than-wide (0) Margins: ill-defined (0) Echogenic foci: none (0) ACR TI-RADS total points: 3. ACR TI-RADS risk category: TR 3. ACR TI-RADS recommendations: Given size (<1.4 cm) and appearance, this nodule does NOT meet TI-RADS criteria for biopsy or dedicated follow-up. _________________________________________________________ No regional cervical adenopathy identified. IMPRESSION: 1. No significant change in multinodular goiter. 2. No nodules meet criteria for biopsy or follow-up. The above is in keeping with the ACR TI-RADS recommendations - J Am Coll Radiol 2017;14:587-595. Electronically Signed   By: JONETTA Faes M.D.   On: 08/23/2023 11:22   CT CHEST HIGH RESOLUTION Result Date: 08/10/2023 CLINICAL DATA:  Interstitial lung disease follow-up EXAM: CT CHEST WITHOUT CONTRAST TECHNIQUE: Multidetector CT imaging of the chest was performed following the standard protocol without intravenous contrast. High resolution imaging of the lungs, as well as inspiratory and expiratory imaging, was performed. RADIATION DOSE REDUCTION: This exam was performed according to the departmental dose-optimization program which includes automated exposure control, adjustment of the mA and/or kV according to patient size and/or use of iterative reconstruction technique. COMPARISON:  Chest CT November 27, 2022 FINDINGS: Cardiovascular: The heart size is normal. Enlarged pulmonary artery measuring 4.3 cm suggestive of pulmonary artery hypertension. No aortic aneurysm. Atherosclerotic calcifications of coronary arteries. Mediastinum/Nodes: Multi station mediastinal subcentimeter and pericentimeter lymph nodes measuring up to 1.1 cm in short axis. Right thyroid  lobe nodule measuring 4 cm, previously measured 3.6 cm. Lungs/Pleura: Interval progression of architectural distortion and fibrotic changes of the lungs, more progressed in lower lobes. Central and peripheral interlobular septal thickening and  honeycombing with associated bronchiectasis and bronchiolectasis, mild ground-glass component an architectural distortion. No new consolidation or suspicious pulmonary nodule. Trachea and major airways are otherwise patent. Upper Abdomen: Right hepatic lobe punctate calcification. Cholecystectomy. Subcentimeter splenule. Musculoskeletal: Multilevel degenerative changes of the spine. IMPRESSION: Progressive fibrotic changes of the lungs in a pattern inconsistent with UIP. Constellation of findings are likely represent chronic hypersensitivity pneumonitis or fibrosing NSIP. No new suspicious nodule. Aortic and coronary artery  atherosclerosis (  ICD10-I70.0). Enlarged pulmonary artery suggestive of pulmonary artery hypertension. Electronically Signed   By: Megan  Zare M.D.   On: 08/10/2023 13:15    Administration History     None          Latest Ref Rng & Units 07/10/2023   10:09 AM 11/13/2022    9:42 AM 01/08/2022   10:47 AM 08/29/2020    8:48 AM 10/03/2019    8:47 AM 12/27/2018    1:33 PM  PFT Results  FVC-Pre L 1.93  2.19  2.23  2.65  2.90  3.03   FVC-Predicted Pre % 44  50  50  59  64  67   FVC-Post L 1.91  2.22  2.29  2.74   3.00   FVC-Predicted Post % 44  50  52  61   66   Pre FEV1/FVC % % 88  90  90  89  85  85   Post FEV1/FCV % % 93  90  89  88   87   FEV1-Pre L 1.70  1.97  2.01  2.36  2.47  2.57   FEV1-Predicted Pre % 54  62  62  72  75  77   FEV1-Post L 1.77  2.00  2.05  2.42   2.61   DLCO uncorrected ml/min/mmHg 9.11  11.54  14.84  19.75  21.35  22.56   DLCO UNC% % 35  44  57  75  81  85   DLCO corrected ml/min/mmHg 9.70  11.54  14.84  19.75  21.35    DLCO COR %Predicted % 37  44  57  75  81    DLVA Predicted % 82  89  100  116  120  125   TLC L 2.69  3.92  3.70  4.55   4.33   TLC % Predicted % 37  54  51  62   59   RV % Predicted % 4  47  52  67   47     No results found for: NITRICOXIDE   Assessment & Plan:   Assessment & Plan ILD (interstitial lung disease)  (HCC)  Interstitial lung disease (HCC)    No follow-ups on file.  Candis Dandy, PA-C 08/26/2023

## 2023-08-28 ENCOUNTER — Encounter (HOSPITAL_COMMUNITY): Attending: Pulmonary Disease

## 2023-08-28 ENCOUNTER — Telehealth (HOSPITAL_COMMUNITY): Payer: Self-pay

## 2023-08-28 DIAGNOSIS — J849 Interstitial pulmonary disease, unspecified: Secondary | ICD-10-CM | POA: Insufficient documentation

## 2023-09-02 ENCOUNTER — Other Ambulatory Visit: Payer: Self-pay

## 2023-09-02 ENCOUNTER — Encounter (HOSPITAL_COMMUNITY)
Admission: RE | Disposition: A | Discharge status: Home / Self Care | Payer: Self-pay | Source: Home / Self Care | Attending: Internal Medicine

## 2023-09-02 ENCOUNTER — Other Ambulatory Visit (HOSPITAL_COMMUNITY): Payer: Self-pay | Admitting: Internal Medicine

## 2023-09-02 ENCOUNTER — Ambulatory Visit (HOSPITAL_COMMUNITY)
Admission: RE | Admit: 2023-09-02 | Discharge: 2023-09-02 | Disposition: A | Discharge status: Home / Self Care | Attending: Internal Medicine | Admitting: Internal Medicine

## 2023-09-02 ENCOUNTER — Encounter (HOSPITAL_COMMUNITY): Payer: Self-pay | Admitting: Internal Medicine

## 2023-09-02 ENCOUNTER — Inpatient Hospital Stay (HOSPITAL_COMMUNITY)
Admission: RE | Admit: 2023-09-02 | Discharge: 2023-09-02 | Disposition: A | Discharge status: Home / Self Care | Source: Ambulatory Visit | Attending: Internal Medicine | Admitting: Internal Medicine

## 2023-09-02 DIAGNOSIS — I4891 Unspecified atrial fibrillation: Secondary | ICD-10-CM | POA: Insufficient documentation

## 2023-09-02 DIAGNOSIS — R634 Abnormal weight loss: Secondary | ICD-10-CM | POA: Diagnosis not present

## 2023-09-02 DIAGNOSIS — R197 Diarrhea, unspecified: Secondary | ICD-10-CM | POA: Diagnosis not present

## 2023-09-02 DIAGNOSIS — Z6831 Body mass index (BMI) 31.0-31.9, adult: Secondary | ICD-10-CM | POA: Diagnosis not present

## 2023-09-02 DIAGNOSIS — I2721 Secondary pulmonary arterial hypertension: Secondary | ICD-10-CM | POA: Insufficient documentation

## 2023-09-02 DIAGNOSIS — J849 Interstitial pulmonary disease, unspecified: Secondary | ICD-10-CM | POA: Diagnosis not present

## 2023-09-02 DIAGNOSIS — R0602 Shortness of breath: Secondary | ICD-10-CM

## 2023-09-02 DIAGNOSIS — Z87891 Personal history of nicotine dependence: Secondary | ICD-10-CM | POA: Insufficient documentation

## 2023-09-02 DIAGNOSIS — G4733 Obstructive sleep apnea (adult) (pediatric): Secondary | ICD-10-CM | POA: Diagnosis not present

## 2023-09-02 DIAGNOSIS — Z79899 Other long term (current) drug therapy: Secondary | ICD-10-CM | POA: Insufficient documentation

## 2023-09-02 DIAGNOSIS — I480 Paroxysmal atrial fibrillation: Secondary | ICD-10-CM

## 2023-09-02 HISTORY — PX: RIGHT HEART CATH: CATH118263

## 2023-09-02 LAB — POCT I-STAT EG7
Acid-base deficit: 1 mmol/L (ref 0.0–2.0)
Acid-base deficit: 1 mmol/L (ref 0.0–2.0)
Acid-base deficit: 1 mmol/L (ref 0.0–2.0)
Bicarbonate: 24.4 mmol/L (ref 20.0–28.0)
Bicarbonate: 24.4 mmol/L (ref 20.0–28.0)
Bicarbonate: 24.9 mmol/L (ref 20.0–28.0)
Calcium, Ion: 1.19 mmol/L (ref 1.15–1.40)
Calcium, Ion: 1.26 mmol/L (ref 1.15–1.40)
Calcium, Ion: 1.27 mmol/L (ref 1.15–1.40)
HCT: 30 % — ABNORMAL LOW (ref 39.0–52.0)
HCT: 30 % — ABNORMAL LOW (ref 39.0–52.0)
HCT: 30 % — ABNORMAL LOW (ref 39.0–52.0)
Hemoglobin: 10.2 g/dL — ABNORMAL LOW (ref 13.0–17.0)
Hemoglobin: 10.2 g/dL — ABNORMAL LOW (ref 13.0–17.0)
Hemoglobin: 10.2 g/dL — ABNORMAL LOW (ref 13.0–17.0)
O2 Saturation: 61 %
O2 Saturation: 63 %
O2 Saturation: 66 %
Potassium: 3.8 mmol/L (ref 3.5–5.1)
Potassium: 3.9 mmol/L (ref 3.5–5.1)
Potassium: 4 mmol/L (ref 3.5–5.1)
Sodium: 141 mmol/L (ref 135–145)
Sodium: 142 mmol/L (ref 135–145)
Sodium: 142 mmol/L (ref 135–145)
TCO2: 26 mmol/L (ref 22–32)
TCO2: 26 mmol/L (ref 22–32)
TCO2: 26 mmol/L (ref 22–32)
pCO2, Ven: 43.4 mmHg — ABNORMAL LOW (ref 44–60)
pCO2, Ven: 44.7 mmHg (ref 44–60)
pCO2, Ven: 44.8 mmHg (ref 44–60)
pH, Ven: 7.344 (ref 7.25–7.43)
pH, Ven: 7.354 (ref 7.25–7.43)
pH, Ven: 7.359 (ref 7.25–7.43)
pO2, Ven: 33 mmHg (ref 32–45)
pO2, Ven: 35 mmHg (ref 32–45)
pO2, Ven: 36 mmHg (ref 32–45)

## 2023-09-02 LAB — BASIC METABOLIC PANEL WITH GFR
Anion gap: 9 (ref 5–15)
BUN: 15 mg/dL (ref 8–23)
CO2: 24 mmol/L (ref 22–32)
Calcium: 9 mg/dL (ref 8.9–10.3)
Chloride: 105 mmol/L (ref 98–111)
Creatinine, Ser: 1.26 mg/dL — ABNORMAL HIGH (ref 0.61–1.24)
GFR, Estimated: 59 mL/min — ABNORMAL LOW (ref >60)
Glucose, Bld: 108 mg/dL — ABNORMAL HIGH (ref 70–99)
Potassium: 3.4 mmol/L — ABNORMAL LOW (ref 3.5–5.1)
Sodium: 138 mmol/L (ref 135–145)

## 2023-09-02 LAB — CBC
HCT: 36 % — ABNORMAL LOW (ref 39.0–52.0)
Hemoglobin: 12.2 g/dL — ABNORMAL LOW (ref 13.0–17.0)
MCH: 32.8 pg (ref 26.0–34.0)
MCHC: 33.9 g/dL (ref 30.0–36.0)
MCV: 96.8 fL (ref 80.0–100.0)
Platelets: 273 K/uL (ref 150–400)
RBC: 3.72 MIL/uL — ABNORMAL LOW (ref 4.22–5.81)
RDW: 15.2 % (ref 11.5–15.5)
WBC: 9 K/uL (ref 4.0–10.5)
nRBC: 0 % (ref 0.0–0.2)

## 2023-09-02 SURGERY — RIGHT HEART CATH
Anesthesia: LOCAL

## 2023-09-02 MED ORDER — FLECAINIDE ACETATE 100 MG PO TABS
300.0000 mg | ORAL_TABLET | Freq: Once | ORAL | Status: AC
  Filled 2023-09-02 (×2): qty 3

## 2023-09-02 MED ORDER — LABETALOL HCL 5 MG/ML IV SOLN: 10.0000 mg | INTRAVENOUS | Status: DC | PRN

## 2023-09-02 MED ORDER — HEPARIN (PORCINE) IN NACL 1000-0.9 UT/500ML-% IV SOLN
INTRAVENOUS | Status: DC | PRN
  Administered 2023-09-02: 500 mL

## 2023-09-02 MED ORDER — LACTATED RINGERS IV SOLN: INTRAVENOUS | Status: AC

## 2023-09-02 MED ORDER — ACETAMINOPHEN 325 MG PO TABS
650.0000 mg | ORAL_TABLET | ORAL | Status: DC | PRN
  Administered 2023-09-02: 650 mg via ORAL
  Filled 2023-09-02: qty 2

## 2023-09-02 MED ORDER — SODIUM CHLORIDE 0.9 % IV SOLN: INTRAVENOUS | Status: DC

## 2023-09-02 MED ORDER — LIDOCAINE HCL (PF) 1 % IJ SOLN
INTRAMUSCULAR | Status: DC | PRN
  Administered 2023-09-02: 2 mL

## 2023-09-02 MED ORDER — HYDRALAZINE HCL 20 MG/ML IJ SOLN: 10.0000 mg | INTRAMUSCULAR | Status: DC | PRN

## 2023-09-02 MED ORDER — SODIUM CHLORIDE 0.9 % IV SOLN: 250.0000 mL | INTRAVENOUS | Status: DC | PRN

## 2023-09-02 MED ORDER — SODIUM CHLORIDE 0.9% FLUSH
3.0000 mL | INTRAVENOUS | Status: DC | PRN
Start: 2023-09-02 — End: 2023-09-02

## 2023-09-02 MED ORDER — FLECAINIDE ACETATE 100 MG PO TABS
300.0000 mg | ORAL_TABLET | ORAL | Status: AC
  Administered 2023-09-02: 300 mg via ORAL
  Filled 2023-09-02: qty 3

## 2023-09-02 MED ORDER — LIDOCAINE HCL (PF) 1 % IJ SOLN
INTRAMUSCULAR | Status: AC
  Filled 2023-09-02: qty 30

## 2023-09-02 MED ORDER — POTASSIUM CHLORIDE CRYS ER 20 MEQ PO TBCR
40.0000 meq | EXTENDED_RELEASE_TABLET | Freq: Once | ORAL | Status: AC
Start: 2023-09-02 — End: 2023-09-02
  Administered 2023-09-02: 40 meq via ORAL
  Filled 2023-09-02: qty 2

## 2023-09-02 MED ORDER — SODIUM CHLORIDE 0.9% FLUSH: 3.0000 mL | Freq: Two times a day (BID) | INTRAVENOUS | Status: DC

## 2023-09-02 MED ORDER — ONDANSETRON HCL 4 MG/2ML IJ SOLN: 4.0000 mg | Freq: Four times a day (QID) | INTRAMUSCULAR | Status: DC | PRN

## 2023-09-02 SURGICAL SUPPLY — 6 items
CATH SWAN GANZ 7F STRAIGHT (CATHETERS) IMPLANT
GLIDESHEATH SLENDER 7FR .021G (SHEATH) IMPLANT
GUIDEWIRE .025 260CM (WIRE) IMPLANT
PACK CARDIAC CATHETERIZATION (CUSTOM PROCEDURE TRAY) ×1 IMPLANT
TRANSDUCER W/STOPCOCK (MISCELLANEOUS) IMPLANT
TUBING ART PRESS 72 MALE/FEM (TUBING) IMPLANT

## 2023-09-02 NOTE — Discharge Instructions (Signed)

## 2023-09-04 DIAGNOSIS — Z1389 Encounter for screening for other disorder: Secondary | ICD-10-CM | POA: Diagnosis not present

## 2023-09-04 NOTE — Telephone Encounter (Signed)
  Joint/Praveen  Patient has WHO group 3 pulmonary hypertension based on PA pressures 25.  And the fact he has ILD.  Using a PVR of 3.5 [Fick] see below and a normal wedge he would potentially qualify for inhaled treprostinil treatment as standard of care.  Because of the PAH she does not qualify for treprostinil versus placebo research study which is meant for fibrosis in the absence of pulmonary hypertension.  He does not qualify for another pulmonary hypertension study that we have which requires a PVR of greater than 4.\\  At some point in the future if you have another pulmonary hypertension study we will qualify but at this point I would say you should work with MD to get him on inhaled Tyvaso   Findings:   RA = 3 RV = 32/3 PA = 35/17 (25) PCW = 7 Fick cardiac output/index = 5.0/2.2 Thermo CO/CI = 6.5/2.9 PVR = 2.8 WU (thermo) 3.5 (Fick) Ao sat = 99% PA sat = 62%, 65% SVC = 63% PAPi = 6.0   Assessment: 1. Very mild PAH with normal left-sided filling pressures and outputs (suspect Thermodilution more accurate) 2. Patient developed AF with catheter manipulation in RA.    Plan/Discussion:    Minimal PAH. Procedurally related AF. Will treat with flecainide  x 1 and place Zio x 14 days. Will not start AC if breaks with flecainide  given no previous h/o AF.    Toribio Fuel, MD  11:07 AM

## 2023-09-06 NOTE — Interval H&P Note (Signed)
 History and Physical Interval Note:  09/06/2023 4:11 PM  Alexander Nunez  has presented today for surgery, with the diagnosis of dyspnea.  The various methods of treatment have been discussed with the patient and family. After consideration of risks, benefits and other options for treatment, the patient has consented to  Procedure(s): RIGHT HEART CATH (N/A) as a surgical intervention.  The patient's history has been reviewed, patient examined, no change in status, stable for surgery.  I have reviewed the patient's chart and labs.  Questions were answered to the patient's satisfaction.     Elishah Ashmore

## 2023-09-07 ENCOUNTER — Encounter (HOSPITAL_COMMUNITY)
Admission: RE | Admit: 2023-09-07 | Discharge: 2023-09-07 | Disposition: A | Discharge status: Home / Self Care | Source: Ambulatory Visit | Attending: Pulmonary Disease | Admitting: Pulmonary Disease

## 2023-09-07 DIAGNOSIS — J849 Interstitial pulmonary disease, unspecified: Secondary | ICD-10-CM | POA: Insufficient documentation

## 2023-09-13 DIAGNOSIS — J849 Interstitial pulmonary disease, unspecified: Secondary | ICD-10-CM | POA: Diagnosis not present

## 2023-09-16 ENCOUNTER — Encounter (HOSPITAL_COMMUNITY)
Admission: RE | Admit: 2023-09-16 | Discharge: 2023-09-16 | Disposition: A | Discharge status: Home / Self Care | Source: Ambulatory Visit | Attending: Gastroenterology | Admitting: Gastroenterology

## 2023-09-16 ENCOUNTER — Encounter (HOSPITAL_COMMUNITY): Payer: Self-pay

## 2023-09-16 ENCOUNTER — Other Ambulatory Visit: Payer: Self-pay

## 2023-09-17 NOTE — Anesthesia Preprocedure Evaluation (Addendum)
 Anesthesia Evaluation  Patient identified by MRN, date of birth, ID band Patient awake    Reviewed: Allergy & Precautions, H&P , NPO status , Patient's Chart, lab work & pertinent test results, reviewed documented beta blocker date and time   Airway Mallampati: III  TM Distance: >3 FB Neck ROM: full    Dental  (+) Partial Upper, Partial Lower, Missing   Pulmonary shortness of breath, sleep apnea , pneumonia, resolved,  oxygen  dependent, former smoker Pulmonary fibrosis and oxygen  dependent   Pulmonary exam normal breath sounds clear to auscultation       Cardiovascular Exercise Tolerance: Good hypertension, Pt. on medications Normal cardiovascular exam Rhythm:Regular Rate:Normal  palpitations   Neuro/Psych  PSYCHIATRIC DISORDERS Anxiety     negative neurological ROS     GI/Hepatic Neg liver ROS,GERD  ,,  Endo/Other    Renal/GU negative Renal ROS  negative genitourinary   Musculoskeletal  (+) Arthritis , Osteoarthritis,    Abdominal   Peds  Hematology negative hematology ROS (+)   Anesthesia Other Findings   Reproductive/Obstetrics negative OB ROS                              Anesthesia Physical Anesthesia Plan  ASA: 3  Anesthesia Plan: General   Post-op Pain Management: Minimal or no pain anticipated   Induction: Intravenous  PONV Risk Score and Plan: Propofol  infusion  Airway Management Planned: Nasal Cannula and Natural Airway  Additional Equipment: None  Intra-op Plan:   Post-operative Plan:   Informed Consent: I have reviewed the patients History and Physical, chart, labs and discussed the procedure including the risks, benefits and alternatives for the proposed anesthesia with the patient or authorized representative who has indicated his/her understanding and acceptance.     Dental Advisory Given  Plan Discussed with: CRNA  Anesthesia Plan Comments:           Anesthesia Quick Evaluation

## 2023-09-18 ENCOUNTER — Ambulatory Visit (HOSPITAL_COMMUNITY): Payer: Self-pay | Admitting: Certified Registered"

## 2023-09-18 ENCOUNTER — Encounter (INDEPENDENT_AMBULATORY_CARE_PROVIDER_SITE_OTHER): Payer: Self-pay | Admitting: *Deleted

## 2023-09-18 ENCOUNTER — Other Ambulatory Visit: Payer: Self-pay

## 2023-09-18 ENCOUNTER — Encounter (HOSPITAL_COMMUNITY): Payer: Self-pay | Admitting: Gastroenterology

## 2023-09-18 ENCOUNTER — Ambulatory Visit (HOSPITAL_COMMUNITY)
Admission: RE | Admit: 2023-09-18 | Discharge: 2023-09-18 | Disposition: A | Discharge status: Home / Self Care | Attending: Gastroenterology | Admitting: Gastroenterology

## 2023-09-18 ENCOUNTER — Encounter (HOSPITAL_COMMUNITY)
Admission: RE | Disposition: A | Discharge status: Home / Self Care | Payer: Self-pay | Source: Home / Self Care | Attending: Gastroenterology

## 2023-09-18 ENCOUNTER — Ambulatory Visit (HOSPITAL_BASED_OUTPATIENT_CLINIC_OR_DEPARTMENT_OTHER): Payer: Self-pay | Admitting: Certified Registered"

## 2023-09-18 DIAGNOSIS — Z79899 Other long term (current) drug therapy: Secondary | ICD-10-CM | POA: Diagnosis not present

## 2023-09-18 DIAGNOSIS — M810 Age-related osteoporosis without current pathological fracture: Secondary | ICD-10-CM | POA: Insufficient documentation

## 2023-09-18 DIAGNOSIS — M1990 Unspecified osteoarthritis, unspecified site: Secondary | ICD-10-CM | POA: Diagnosis not present

## 2023-09-18 DIAGNOSIS — K529 Noninfective gastroenteritis and colitis, unspecified: Secondary | ICD-10-CM | POA: Diagnosis not present

## 2023-09-18 DIAGNOSIS — K648 Other hemorrhoids: Secondary | ICD-10-CM

## 2023-09-18 DIAGNOSIS — Z9981 Dependence on supplemental oxygen: Secondary | ICD-10-CM | POA: Insufficient documentation

## 2023-09-18 DIAGNOSIS — L409 Psoriasis, unspecified: Secondary | ICD-10-CM | POA: Insufficient documentation

## 2023-09-18 DIAGNOSIS — K5730 Diverticulosis of large intestine without perforation or abscess without bleeding: Secondary | ICD-10-CM | POA: Diagnosis not present

## 2023-09-18 DIAGNOSIS — G4730 Sleep apnea, unspecified: Secondary | ICD-10-CM | POA: Insufficient documentation

## 2023-09-18 DIAGNOSIS — R634 Abnormal weight loss: Secondary | ICD-10-CM | POA: Diagnosis not present

## 2023-09-18 DIAGNOSIS — Z87891 Personal history of nicotine dependence: Secondary | ICD-10-CM | POA: Insufficient documentation

## 2023-09-18 DIAGNOSIS — K6389 Other specified diseases of intestine: Secondary | ICD-10-CM | POA: Diagnosis not present

## 2023-09-18 DIAGNOSIS — J8410 Pulmonary fibrosis, unspecified: Secondary | ICD-10-CM | POA: Insufficient documentation

## 2023-09-18 DIAGNOSIS — I10 Essential (primary) hypertension: Secondary | ICD-10-CM | POA: Diagnosis not present

## 2023-09-18 DIAGNOSIS — Z6831 Body mass index (BMI) 31.0-31.9, adult: Secondary | ICD-10-CM | POA: Insufficient documentation

## 2023-09-18 DIAGNOSIS — F419 Anxiety disorder, unspecified: Secondary | ICD-10-CM | POA: Insufficient documentation

## 2023-09-18 DIAGNOSIS — K219 Gastro-esophageal reflux disease without esophagitis: Secondary | ICD-10-CM | POA: Diagnosis not present

## 2023-09-18 DIAGNOSIS — R197 Diarrhea, unspecified: Secondary | ICD-10-CM | POA: Diagnosis not present

## 2023-09-18 DIAGNOSIS — K635 Polyp of colon: Secondary | ICD-10-CM | POA: Diagnosis not present

## 2023-09-18 HISTORY — PX: COLONOSCOPY: SHX5424

## 2023-09-18 LAB — HM COLONOSCOPY

## 2023-09-18 SURGERY — COLONOSCOPY
Anesthesia: General

## 2023-09-18 MED ORDER — VASOPRESSIN 20 UNIT/ML IV SOLN
INTRAVENOUS | Status: DC | PRN
  Administered 2023-09-18: 1 [IU] via INTRAVENOUS

## 2023-09-18 MED ORDER — LACTATED RINGERS IV SOLN: INTRAVENOUS | Status: DC

## 2023-09-18 MED ORDER — LACTATED RINGERS IV SOLN: INTRAVENOUS | Status: DC | PRN

## 2023-09-18 MED ORDER — EPHEDRINE SULFATE-NACL 50-0.9 MG/10ML-% IV SOSY
PREFILLED_SYRINGE | INTRAVENOUS | Status: DC | PRN
  Administered 2023-09-18 (×2): 5 mg via INTRAVENOUS

## 2023-09-18 MED ORDER — PHENYLEPHRINE 80 MCG/ML (10ML) SYRINGE FOR IV PUSH (FOR BLOOD PRESSURE SUPPORT)
PREFILLED_SYRINGE | INTRAVENOUS | Status: DC | PRN
  Administered 2023-09-18 (×2): 80 ug via INTRAVENOUS
  Administered 2023-09-18: 40 ug via INTRAVENOUS
  Administered 2023-09-18 (×3): 80 ug via INTRAVENOUS

## 2023-09-18 MED ORDER — PROPOFOL 500 MG/50ML IV EMUL
INTRAVENOUS | Status: DC | PRN
  Administered 2023-09-18: 100 ug/kg/min via INTRAVENOUS
  Administered 2023-09-18: 60 mg via INTRAVENOUS

## 2023-09-18 MED ORDER — LIDOCAINE 2% (20 MG/ML) 5 ML SYRINGE
INTRAMUSCULAR | Status: DC | PRN
  Administered 2023-09-18: 60 mg via INTRAVENOUS

## 2023-09-18 NOTE — Transfer of Care (Signed)
 Immediate Anesthesia Transfer of Care Note  Patient: Alexander Nunez  Procedure(s) Performed: COLONOSCOPY  Patient Location: PACU  Anesthesia Type:General  Level of Consciousness: awake, oriented, drowsy, and patient cooperative  Airway & Oxygen  Therapy: Patient Spontanous Breathing and Patient connected to nasal cannula oxygen   Post-op Assessment: Report given to RN, Post -op Vital signs reviewed and stable, and Patient moving all extremities X 4  Post vital signs: Reviewed and stable  Last Vitals:  Vitals Value Taken Time  BP 102/46 09/18/23 09:11  Temp 37 C 09/18/23 09:11  Pulse 65 09/18/23 09:11  Resp 29 09/18/23 09:11  SpO2 100 % 09/18/23 09:11    Last Pain:  Vitals:   09/18/23 0911  TempSrc: Axillary  PainSc:          Complications: No notable events documented.

## 2023-09-18 NOTE — Anesthesia Postprocedure Evaluation (Signed)
 Anesthesia Post Note  Patient: Alexander Nunez  Procedure(s) Performed: COLONOSCOPY  Patient location during evaluation: PACU Anesthesia Type: General Level of consciousness: awake and alert Pain management: pain level controlled Vital Signs Assessment: post-procedure vital signs reviewed and stable Respiratory status: spontaneous breathing, nonlabored ventilation and respiratory function stable Cardiovascular status: blood pressure returned to baseline and stable Postop Assessment: no apparent nausea or vomiting Anesthetic complications: no   There were no known notable events for this encounter.   Last Vitals:  Vitals:   09/18/23 0729 09/18/23 0911  BP: 104/60 (!) 102/46  Pulse: 67 65  Resp: 20 (!) 29  Temp: 36.5 C 37 C  SpO2: 97% 100%    Last Pain:  Vitals:   09/18/23 0913  TempSrc:   PainSc: 0-No pain                 Klarissa Mcilvain L Lorrie Gargan

## 2023-09-18 NOTE — Discharge Instructions (Addendum)

## 2023-09-18 NOTE — Op Note (Signed)
 [facility] Patient Name: Alexander Nunez Procedure Date: 09/18/2023 8:13 AM MRN: [id number] Date of Birth: Feb 22, 1945 Attending MD: Deatrice Dine , MD, [id number] CSN: [id number] Age: 78 Admit Type: Outpatient Procedure:                Colonoscopy Indications:              Chronic diarrhea, Weight loss Providers:                Deatrice Dine, MD, Harlene Lips, Rosina Sprague, Jon Loge Referring MD:              Medicines:                Monitored Anesthesia Care Complications:            No immediate complications. Estimated Blood Loss:     Estimated blood loss was minimal. Procedure:                Pre-Anesthesia Assessment:                           - Prior to the procedure, a History and Physical                            was performed, and patient medications and                            allergies were reviewed. The patient's tolerance of                            previous anesthesia was also reviewed. The risks                            and benefits of the procedure and the sedation                            options and risks were discussed with the patient.                            All questions were answered, and informed consent                            was obtained. Prior Anticoagulants: The patient has                            taken no anticoagulant or antiplatelet agents                            except for NSAID medication. ASA Grade Assessment:                            II - A patient with mild systemic disease. After  reviewing the risks and benefits, the patient was                            deemed in satisfactory condition to undergo the                            procedure.                           After obtaining informed consent, the colonoscope                            was passed under direct vision. Throughout the                            procedure, the patient's blood  pressure, pulse, and                            oxygen  saturations were monitored continuously. The                            CF-[id number] ([id number]) Colon was introduced through                            the anus and advanced to the the terminal ileum.                            The colonoscopy was performed without difficulty.                            The patient tolerated the procedure well. The                            quality of the bowel preparation was evaluated                            using the BBPS [facility] Bowel Preparation Scale)                            with scores of: Right Colon = 3, Transverse Colon =                            3 and Left Colon = 3 (entire mucosa seen well with                            no residual staining, small fragments of stool or                            opaque liquid). The total BBPS score equals 9. The                            terminal ileum, ileocecal valve, appendiceal  orifice, and rectum were photographed. Scope In: 8:31:07 AM Scope Out: 9:07:00 AM Scope Withdrawal Time: 0 hours 33 minutes 12 seconds  Total Procedure Duration: 0 hours 35 minutes 53 seconds  Findings:      The perianal and digital rectal examinations were normal. Pertinent       negatives include normal sphincter tone.      Diffuse moderate inflammation characterized by adherent blood,       congestion (edema), erosions and friability was found in the       recto-sigmoid colon. Biopsies were obtained in the rectum, in the       sigmoid colon, in the descending colon, in the transverse colon, in the       ascending colon and in the cecum with cold forceps for histology.       Estimated blood loss was minimal.      A localized area of nodular mucosa was found in the sigmoid colon. Area       was successfully injected with 5 mL Eleview for a lift polypectomy.       Biopsies were taken with a cold forceps for histology. Removal was        attempted because there was no distinct boarders upon lifting      Scattered medium-mouthed diverticula were found in the left colon.      The terminal ileum appeared normal. Biopsies were taken with a cold       forceps for histology.      Non-bleeding internal hemorrhoids were found during retroflexion. The       hemorrhoids were small.      An area of granular mucosa was found in the entire colon. Biopsies were       taken with a cold forceps for histology. Impression:               - Diffuse moderate inflammation was found in the                            recto-sigmoid colon, rule out inflammatory bowel                            disease.                           - Nodular mucosa in the sigmoid colon. Injected.                            Biopsied; no removal was performed given no                            distinct borders                           - Diverticulosis in the left colon.                           - The examined portion of the ileum was normal.                            Biopsied.                           -  Non-bleeding internal hemorrhoids.                           - Granularity in the entire examined colon.                            Biopsied.                           - Biopsies were obtained in the rectum, in the                            sigmoid colon, in the descending colon, in the                            transverse colon, in the ascending colon and in the                            cecum. Moderate Sedation:      Per Anesthesia Care Recommendation:           - Patient has a contact number available for                            emergencies. The signs and symptoms of potential                            delayed complications were discussed with the                            patient. Return to normal activities tomorrow.                            Written discharge instructions were provided to the                            patient.                            - Resume previous diet.                           - Continue present medications.                           - Await pathology results.                           - Repeat colonoscopy in 3 - 5 years for                            surveillance based on pathology results.                           - Return to GI office as previously scheduled.                           -  No aspirin , ibuprofen, naproxen, or other                            non-steroidal anti-inflammatory drugs. Procedure Code(s):        --- Professional ---                           [phone], Colonoscopy, flexible; with biopsy, single                            or multiple                           [id number], Colonoscopy, flexible; with directed                            submucosal injection(s), any substance Diagnosis Code(s):        --- Professional ---                           K52.9, Noninfective gastroenteritis and colitis,                            unspecified                           K63.89, Other specified diseases of intestine                           K64.8, Other hemorrhoids                           R63.4, Abnormal weight loss CPT copyright 2022 American Medical Association. All rights reserved. The codes documented in this report are preliminary and upon coder review may  be revised to meet current compliance requirements. Deatrice Dine, MD Deatrice Dine, MD 09/18/2023 9:16:14 AM This report has been signed electronically. Number of Addenda: 0

## 2023-09-18 NOTE — Interval H&P Note (Signed)
 History and Physical Interval Note:  09/18/2023 7:43 AM  Alexander Nunez  has presented today for surgery, with the diagnosis of weight loss, diarrhea.  The various methods of treatment have been discussed with the patient and family. After consideration of risks, benefits and other options for treatment, the patient has consented to  Procedure(s) with comments: COLONOSCOPY (N/A) - 815am, asa 3, needs random colonic bx as a surgical intervention.  The patient's history has been reviewed, patient examined, no change in status, stable for surgery.  I have reviewed the patient's chart and labs.  Questions were answered to the patient's satisfaction.     Alexander Nunez

## 2023-09-22 ENCOUNTER — Encounter (HOSPITAL_COMMUNITY): Payer: Self-pay | Admitting: Gastroenterology

## 2023-09-22 DIAGNOSIS — I4891 Unspecified atrial fibrillation: Secondary | ICD-10-CM | POA: Diagnosis not present

## 2023-09-23 LAB — SURGICAL PATHOLOGY

## 2023-09-25 NOTE — Addendum Note (Signed)
 Encounter addended by: Debarah Garrison MATSU, RN on: 09/25/2023 10:59 AM  Actions taken: Imaging Exam ended

## 2023-09-28 ENCOUNTER — Ambulatory Visit (INDEPENDENT_AMBULATORY_CARE_PROVIDER_SITE_OTHER): Payer: Self-pay | Admitting: Gastroenterology

## 2023-09-29 NOTE — Progress Notes (Signed)
 5 yr TCS noted in recall Patient result letter mailed procedure note and pathology result faxed to PCP

## 2023-10-06 ENCOUNTER — Ambulatory Visit (INDEPENDENT_AMBULATORY_CARE_PROVIDER_SITE_OTHER): Admitting: Gastroenterology

## 2023-10-06 ENCOUNTER — Encounter (INDEPENDENT_AMBULATORY_CARE_PROVIDER_SITE_OTHER): Payer: Self-pay | Admitting: Gastroenterology

## 2023-10-06 VITALS — BP 102/60 | HR 63 | Temp 98.0°F | Ht 71.0 in | Wt 223.5 lb

## 2023-10-06 DIAGNOSIS — K8681 Exocrine pancreatic insufficiency: Secondary | ICD-10-CM | POA: Diagnosis not present

## 2023-10-06 DIAGNOSIS — R197 Diarrhea, unspecified: Secondary | ICD-10-CM | POA: Diagnosis not present

## 2023-10-06 NOTE — Progress Notes (Signed)
 Referring Provider: Bertell Satterfield, MD Primary Care Physician:  Nichole Senior, MD Primary GI Physician: Dr. Cinderella  Chief Complaint  Patient presents with   Follow-up    Pt arrives for follow up. Pt is wanting to know about his colonoscopy report. Still having diarrhea,comes and goes.    HPI:   Alexander Nunez is a 78 y.o. male with past medical history of anxiety, arthritis, diverticulitis, GERD, high cholesterol, HTN, IBS, osteoporosis, psoriasis, pulmonary fibrosis, sleep apnea, anal fissure   Patient presenting today for:  Follow up of diarrhea thought secondary to EPI  Last seen July, at that time having some rectal pain, rectal itching and bleeding. Using CA compound cream with diltiazem that he got in 2022, started creon  with some mild improvement, stools watery to mushy, 2-4 per day. Continuing to lose weight  Recommended CA hemorrhoid compound cream with 2% diltiazem, increase creon  to 108k units with meals, 72k units with snacks, keep schedule TCS with biopsies  Colonoscopy: 08/25/23 - Diffuse moderate inflammation was found in the                            recto-sigmoid colon, rule out inflammatory bowel                            disease.                           - Nodular mucosa in the sigmoid colon. Injected.                            Biopsied; no removal was performed given no                            distinct borders                           - Diverticulosis in the left colon.                           - The examined portion of the ileum was normal.                            Biopsied.                           - Non-bleeding internal hemorrhoids.                           - Granularity in the entire examined colon.                            Biopsied.                           - Biopsies were obtained in the rectum, in the                            sigmoid colon, in the descending colon, in the  transverse colon, in the ascending  colon and in the                            cecum.  A. TERMINAL ILEUM, BIOPSY:  Ileal mucosa with benign lymphoid aggregates.  No active inflammation, chronic changes or granulomas.   B. COLON, CECAL, ASCENDING, BIOPSY:  Unremarkable colonic mucosa.  Negative for microscopic colitis, active inflammation and granulomas.   C. COLON, TRANSVERSE, DESCENDING, BIOPSY:  Unremarkable colonic mucosa.  Negative for microscopic colitis, active inflammation and granulomas.   D. COLON, SIGMOID, BIOPSY:  Unremarkable colonic mucosa.  Negative for microscopic colitis, active inflammation and granulomas.   E. COLON, SIGMOID, POLYPECTOMY:  Benign colonic mucosa.  Multiple additional levels examined.   F. RECTAL, BIOPSY:  Unremarkable colonic mucosa.  Negative for microscopic colitis, active inflammation and granulomas.   Present:  Diarrhea comes and goes. Yesterday he ate breakfast and had 6 watery stools, he ended up taking up to 4 imodium to help stop his diarrhea. He notes gas at times. Having some semi solid stools more recently. He is doing his creon , did increase to 3 capsules with meals and 2 with snacks, he thinks this has improved some. Weight seems to be more stable recently as well.  Patient denies melena, hematochezia, nausea, vomiting, constipation, dysphagia, odyonophagia, early satiety or weight loss.    CT A/P without contrast: 12/2022 Diverticulosis of descending and sigmoid colon without inflammation. Small amount of free fluid is noted in the pelvis of uncertain etiology. No other acute abnormality seen in the abdomen or pelvis. No changes of pancreatitis or inflammatory changes of pancreas EGD: 05/2023 - Normal esophagus.                           - Gastritis. Biopsied.                           - Normal duodenal bulb and second portion of the                            duodenum.A. STOMACH, RANDOM, BIOPSY:  Gastric antral / oxyntic mucosa without significant diagnostic   alteration.  No H. pylori identified on HE stain.  Negative for intestinal metaplasia or dysplasia.    Past Medical History:  Diagnosis Date   Anxiety    Arthritis    back   Back pain, chronic    Colon polyp    Diverticulitis    GERD (gastroesophageal reflux disease)    Heart palpitations    High cholesterol    Hypertension    Irritable bowel syndrome    Osteoporosis    Psoriasis    Pulmonary fibrosis (HCC)    Pulmonary fibrosis (HCC)    Sleep apnea    c- pap    Past Surgical History:  Procedure Laterality Date   BACK SURGERY  2015   CHOLECYSTECTOMY N/A 10/12/2015   Procedure: LAPAROSCOPIC CHOLECYSTECTOMY;  Surgeon: Oneil Budge, MD;  Location: [facility];  Service: General;  Laterality: N/A;   COLONOSCOPY     COLONOSCOPY N/A 09/18/2023   Procedure: COLONOSCOPY;  Surgeon: Cinderella Deatrice FALCON, MD;  Location: [facility];  Service: Endoscopy;  Laterality: N/A;  815am, asa 3, needs random colonic bx   ESOPHAGOGASTRODUODENOSCOPY N/A 05/07/2023   Procedure: EGD (ESOPHAGOGASTRODUODENOSCOPY);  Surgeon: Cinderella Deatrice FALCON, MD;  Location: [facility];  Service: Endoscopy;  Laterality: N/A;  2:00PM;ASA 3   FOOT FRACTURE SURGERY     left foot   KNEE ARTHROSCOPY     left   RIGHT HEART CATH N/A 09/02/2023   Procedure: RIGHT HEART CATH;  Surgeon: Cherrie Toribio SAUNDERS, MD;  Location: [facility];  Service: Cardiovascular;  Laterality: N/A;   TONSILLECTOMY     UPPER GASTROINTESTINAL ENDOSCOPY      Current Outpatient Medications  Medication Sig Dispense Refill   acetaminophen  (TYLENOL ) 500 MG tablet Take 1,000 mg by mouth every 6 (six) hours as needed for mild pain (pain score 1-3).     albuterol  (VENTOLIN  HFA) 108 (90 Base) MCG/ACT inhaler Inhale 1-2 puffs into the lungs every 4 (four) hours as needed for shortness of breath or wheezing. 6.7 g 5   ALPRAZolam  (XANAX ) 0.5 MG tablet Take 0.25-0.5 mg by mouth 3 (three) times daily as needed for sleep. (Patient taking differently: Take  0.25-0.5 mg by mouth 3 (three) times daily as needed for sleep.  1/2 at bedtime prn.)     azaTHIOprine  (IMURAN ) 50 MG tablet Take 3 tablets (150 mg total) by mouth daily. 270 tablet 3   doxazosin  (CARDURA ) 4 MG tablet Take 4 mg by mouth at bedtime.       fluticasone  (FLONASE ) 50 MCG/ACT nasal spray Place 2 sprays into both nostrils daily. 16 g 0   guaiFENesin  (MUCINEX ) 600 MG 12 hr tablet Take 600 mg by mouth 2 (two) times daily as needed for to loosen phlegm or cough.     lipase/protease/amylase (CREON ) 36000 UNITS CPEP capsule Take 2 capsules (72,000 Units total) by mouth 3 (three) times daily with meals. May also take 1 capsule (36,000 Units total) as needed (with snacks - up to 4 snacks daily). 300 capsule 5   losartan (COZAAR) 25 MG tablet Take 25 mg by mouth daily.     metoprolol  tartrate (LOPRESSOR ) 50 MG tablet Take 25 mg by mouth 2 (two) times daily.     Nintedanib (OFEV ) 150 MG CAPS Take 1 capsule (150 mg total) by mouth 2 (two) times daily. 180 capsule 1   pantoprazole  (PROTONIX ) 40 MG tablet Take 1 tablet (40 mg total) by mouth daily. 30 tablet 1   pravastatin  (PRAVACHOL ) 20 MG tablet Take 20 mg by mouth every evening.      simethicone  (MYLICON) 125 MG chewable tablet Chew 250 mg by mouth every 6 (six) hours as needed for flatulence.     No current facility-administered medications for this visit.    Allergies as of 10/06/2023 - Review Complete 10/06/2023  Allergen Reaction Noted   Cephalexin Other (See Comments) 03/07/2008   Iodinated contrast media Hives, Rash, and Dermatitis 09/17/2011   Prednisone  Hives and Itching 08/19/2023    Social History   Socioeconomic History   Marital status: Married    Spouse name: Not on file   Number of children: 2   Years of education: Not on file   Highest education level: Not on file  Occupational History   Occupation: retired    Comment: Retired  Tobacco Use   Smoking status: Former    Current packs/day: 0.00    Average packs/day:  2.0 packs/day for 11.0 years (22.0 ttl pk-yrs)    Types: Cigarettes    Start date: 05/22/1962    Quit date: 05/21/1973    Years since quitting: 50.4   Smokeless tobacco: Never   Tobacco comments:    Quit 25 yrs  now   Vaping Use   Vaping status: Never Used  Substance and Sexual Activity   Alcohol use: Not Currently    Alcohol/week: 14.0 standard drinks of alcohol    Types: 14 Shots of liquor per week    Comment: no ETOH in 4 months as of 09/16/2023   Drug use: No   Sexual activity: Yes    Partners: Female  Other Topics Concern   Not on file  Social History Narrative   Has dog named Advice worker   Social Drivers of Corporate investment banker Strain: Not on file  Food Insecurity: Not on file  Transportation Needs: Not on file  Physical Activity: Not on file  Stress: Not on file  Social Connections: Not on file    Review of systems General: negative for malaise, night sweats, fever, chills, weight loss Neck: Negative for lumps, goiter, pain and significant neck swelling Resp: Negative for cough, wheezing, dyspnea at rest CV: Negative for chest pain, leg swelling, palpitations, orthopnea GI: denies melena, hematochezia, nausea, vomiting, constipation, dysphagia, odyonophagia, early satiety or unintentional weight loss. +diarrhea  MSK: Negative for joint pain or swelling, back pain, and muscle pain. Derm: Negative for itching or rash Psych: Denies depression, anxiety, memory loss, confusion. No homicidal or suicidal ideation.  Heme: Negative for prolonged bleeding, bruising easily, and swollen nodes. Endocrine: Negative for cold or heat intolerance, polyuria, polydipsia and goiter. Neuro: negative for tremor, gait imbalance, syncope and seizures. The remainder of the review of systems is noncontributory.  Physical Exam: BP 102/60   Pulse 63   Temp 98 F (36.7 C)   Ht 5' 11 (1.803 m)   Wt 223 lb 8 oz (101.4 kg)   BMI 31.17 kg/m  General:   Alert and oriented. No distress  noted. Pleasant and cooperative.  Head:  Normocephalic and atraumatic. Eyes:  Conjuctiva clear without scleral icterus. Mouth:  Oral mucosa pink and moist. Good dentition. No lesions. Heart: Normal rate and rhythm, s1 and s2 heart sounds present.  Lungs: Clear lung sounds in all lobes. Respirations equal and unlabored. Abdomen:  +BS, soft, non-tender and non-distended. No rebound or guarding. No HSM or masses noted. Derm: No palmar erythema or jaundice Msk:  Symmetrical without gross deformities. Normal posture. Extremities:  Without edema. Neurologic:  Alert and  oriented x4 Psych:  Alert and cooperative. Normal mood and affect.  Invalid input(s): 6 MONTHS   ASSESSMENT: BRAIDYN SCORSONE is a 78 y.o. male presenting today for follow up of diarrhea thought secondary to EPI  Diarrhea thought secondary to EPI: pancreatic elastase 158, started on creon  72k units with meals 36kunits with snacks after his visit in early July, and increased to 108k units with meals/72k units with snacks at his last visit, with some improvement and stools becoming more formed, weight now stable. He had recent colonoscopy as outlined above without findings to explain diarrhea. He is notably on OFED for his lung disease which can be known to cause diarrhea though has been on this for the past few years with diarrhea persistently since earlier this year, low suspicion this is the cause of his symptoms, though could be contributing. At this time, I suspect diarrhea is mostly secondary to EPI as he has had some improvements with use of creon , will increase his dose to 144k units with meals and 108k units with snacks, he should let me know how symptoms are in the next 1-2 weeks.    PLAN:  -increase creon  to 144k  units with meals and 108k units with snacks  -pt to make me aware how symptoms are doing in 1-2 weeks on higher dose of creon , samples provided  -will refill based on most appropriate dose pending clinical course  on higher dosage of creon   All questions were answered, patient verbalized understanding and is in agreement with plan as outlined above.    Follow Up: 3 months   Yalda Herd L. Numair Masden, MSN, APRN, AGNP-C Adult-Gerontology Nurse Practitioner [facility]

## 2023-10-06 NOTE — Patient Instructions (Addendum)
-  increase creon  to 144k units (4 capsules) with meals and 108k units (3 capsules) with snacks  -make me aware how symptoms are doing in 1-2 weeks on higher dose of creon  so that I can send appropriately dosed refill  Follow up 3 months  It was a pleasure to see you today. I want to create trusting relationships with patients and provide genuine, compassionate, and quality care. I truly value your feedback! please be on the lookout for a survey regarding your visit with me today. I appreciate your input about our visit and your time in completing this!    Vernel Langenderfer L. Malaiya Paczkowski, MSN, APRN, AGNP-C Adult-Gerontology Nurse Practitioner [facility]

## 2023-10-07 ENCOUNTER — Telehealth (HOSPITAL_COMMUNITY): Payer: Self-pay | Admitting: Internal Medicine

## 2023-10-07 ENCOUNTER — Other Ambulatory Visit (INDEPENDENT_AMBULATORY_CARE_PROVIDER_SITE_OTHER)

## 2023-10-07 ENCOUNTER — Ambulatory Visit (INDEPENDENT_AMBULATORY_CARE_PROVIDER_SITE_OTHER): Admitting: Pulmonary Disease

## 2023-10-07 ENCOUNTER — Encounter: Payer: Self-pay | Admitting: Pulmonary Disease

## 2023-10-07 VITALS — BP 108/60 | HR 57 | Temp 98.4°F | Ht 71.0 in | Wt 223.0 lb

## 2023-10-07 DIAGNOSIS — J849 Interstitial pulmonary disease, unspecified: Secondary | ICD-10-CM

## 2023-10-07 DIAGNOSIS — Z5181 Encounter for therapeutic drug level monitoring: Secondary | ICD-10-CM | POA: Diagnosis not present

## 2023-10-07 DIAGNOSIS — I2729 Other secondary pulmonary hypertension: Secondary | ICD-10-CM | POA: Diagnosis not present

## 2023-10-07 LAB — CBC WITH DIFFERENTIAL/PLATELET
Basophils Absolute: 0.1 K/uL (ref 0.0–0.1)
Basophils Relative: 1 % (ref 0.0–3.0)
Eosinophils Absolute: 0.4 K/uL (ref 0.0–0.7)
Eosinophils Relative: 4.3 % (ref 0.0–5.0)
HCT: 33.9 % — ABNORMAL LOW (ref 39.0–52.0)
Hemoglobin: 11.3 g/dL — ABNORMAL LOW (ref 13.0–17.0)
Lymphocytes Relative: 18.9 % (ref 12.0–46.0)
Lymphs Abs: 1.7 K/uL (ref 0.7–4.0)
MCHC: 33.2 g/dL (ref 30.0–36.0)
MCV: 99.1 fl (ref 78.0–100.0)
Monocytes Absolute: 1 K/uL (ref 0.1–1.0)
Monocytes Relative: 11.1 % (ref 3.0–12.0)
Neutro Abs: 5.7 K/uL (ref 1.4–7.7)
Neutrophils Relative %: 64.7 % (ref 43.0–77.0)
Platelets: 272 K/uL (ref 150.0–400.0)
RBC: 3.42 Mil/uL — ABNORMAL LOW (ref 4.22–5.81)
RDW: 17.1 % — ABNORMAL HIGH (ref 11.5–15.5)
WBC: 8.8 K/uL (ref 4.0–10.5)

## 2023-10-07 LAB — COMPREHENSIVE METABOLIC PANEL WITH GFR
ALT: 4 U/L (ref 0–53)
AST: 12 U/L (ref 0–37)
Albumin: 3.6 g/dL (ref 3.5–5.2)
Alkaline Phosphatase: 41 U/L (ref 39–117)
BUN: 12 mg/dL (ref 6–23)
CO2: 32 meq/L (ref 19–32)
Calcium: 9 mg/dL (ref 8.4–10.5)
Chloride: 101 meq/L (ref 96–112)
Creatinine, Ser: 1.15 mg/dL (ref 0.40–1.50)
GFR: 61.42 mL/min (ref >60.00)
Glucose, Bld: 99 mg/dL (ref 70–99)
Potassium: 3.7 meq/L (ref 3.5–5.1)
Sodium: 141 meq/L (ref 135–145)
Total Bilirubin: 0.6 mg/dL (ref 0.2–1.2)
Total Protein: 7.1 g/dL (ref 6.0–8.3)

## 2023-10-07 NOTE — Patient Instructions (Addendum)
  VISIT SUMMARY: You came in today for a follow-up on your pulmonary and gastrointestinal symptoms. We discussed your current treatments and made some adjustments to help manage your conditions more effectively.  YOUR PLAN: -PULMONARY FIBROSIS WITH MILD PULMONARY HYPERTENSION: Pulmonary fibrosis is a condition where the lung tissue becomes scarred over time, leading to breathing difficulties. Mild pulmonary hypertension means there is slightly increased pressure in the blood vessels of your lungs. We will start you on inhaled treprostinil to help manage this and improve your shortness of breath. We also checked your liver tests and blood counts today. You should adjust your oxygen  levels as needed during exertion, up to 5 L/min. We will repeat your lung function test in three months and start you on pulmonary rehabilitation next month.  -EXOCRINE PANCREATIC INSUFFICIENCY WITH DIARRHEA AND WEIGHT LOSS: Exocrine pancreatic insufficiency is when your pancreas does not produce enough enzymes to properly digest food, leading to symptoms like diarrhea and weight loss. You will continue taking Creon , with an increased dosage of four pills with meals and two with snacks. If Creon  does not adequately control your diarrhea, we may consider adding carob powder. Continue taking Ofev  at 150 mg twice daily and monitor your weight and symptoms of diarrhea.  INSTRUCTIONS: Please follow up in three months. Start pulmonary rehabilitation next month. Continue monitoring your weight and symptoms of diarrhea, and adjust your oxygen  levels as needed during exertion.

## 2023-10-07 NOTE — Progress Notes (Signed)
 Alexander Nunez    [id number]    1945/06/11  Primary Care Physician:South, Garnette, MD  Referring Physician: Bertell Satterfield, MD [address] [place],  [place] [address]  Problem list:  Follow-up for ILD, pulmonary fibrosis On Ofev  since Jan 2023 Cellcept  and myfortic  poorly tolerated in 2024 On Azathoprine since July 2024, dose increased to 100 mg in Oct 2024 and to 150mg  on June 2025  HPI: 78 y.o. with history of interstitial lung disease. hypertension, hyperlipidemia, sleep apnea, psoriasis  2020 Had an incidental finding of interstitial lung disease on CT abdomen which was done for diverticulitis He has seen Dr. Katherene Gelineau at [facility] who did a high resolution CT with findings of alternate diagnosis, possible HP.  He had HP panel on 12/28/2018 which is negative. History notable for psoriasis with skin involvement.  He was on methotrexate for a couple of years and stopped as his skin rash improved.  Last dose of methotrexate was several years ago.  2021 Had COVID-19 infection December 2021, referred for monoclonal antibody  2023 Discussed at multidisciplinary conference on 10/15/2021 CTs show progressive interstitial lung disease and alternate pattern. Differential diagnoses include chronic HP versus NSIP.  Recommend a biopsy and would favor surgical lung biopsy as transbronchial and BAL may not give a definitive diagnosis. MDC see also recommends empiric treatment with antifibrotic's +/- anti-inflammatory if pathology is suggestive.  Started Ofev  in first week of Jan 2023.  He is tolerating it well so far with no issue Has started pulmonary rehab and is doing well States that breathing is better.  2024 Started on CellCept  in February 2024.  He developed rash on his skin immediately after.  He was treated with antifungal cream and discontinuation of CellCept  with improvement in symptoms.  The symptoms recurred after he rechallenge himself with CellCept   and is now completely off the medication. Started my Myfortic  in April 2024 this was also poorly tolerated with weight loss and fatigue and he is off this medication as well Started azathioprine   2025 Developed COVID infection in March 2025, treated with Paxlovid Azathioprine  dose titrated up to 150 mg daily on June 2025 Cardiac cath June 2025 showed very mild PAH  Interval history: Discussed the use of AI scribe software for clinical note transcription with the patient, who gave verbal consent to proceed.  History of Present Illness Alexander Nunez is a 78 year old male with pulmonary fibrosis and exocrine pancreatic insufficiency who presents for follow-up of pulmonary and gastrointestinal symptoms.  Pulmonary symptoms and oxygen  desaturation - Pulmonary fibrosis managed with Ofev  150 mg twice daily and azathioprine  (Imuran ) three times a day - Uses supplemental oxygen  during exertion, adjusting flow as needed - Oxygen  saturation drops significantly with physical activity  Pulmonary hypertension Cardiac arrhythmias and palpitations - Episodes of atrial fibrillation identified during recent heart catheterization which showed mild pulmonary hypertension - History of right bundle branch block with occasional palpitations - Currently wearing a heart monitor for two weeks to assess heart rhythm - No chest pain, shortness of breath, or palpitations reported at this visit  Gastrointestinal symptoms and weight changes - Intermittent diarrhea and weight loss - Colonoscopy with normal biopsies - Diagnosed with exocrine pancreatic insufficiency; pancreatic enzyme levels low - Currently taking Creon , recently increased to four pills with meals - Diarrhea attributed in part to irritable bowel syndrome (IBS) - Weight has stabilized recently  Alcohol use and medication interactions - Enjoys social drinking,  particularly bourbon - No alcohol consumption in the past three months -  Cautious about mixing alcohol with medications, especially Xanax , which he takes occasionally but not in conjunction with alcohol   Relevant pulmonary history Pets: Has a dog.  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  His hobbies include gardening but no significant exposure to mold,No hot tub, Jacuzzi, no down pillows or comforters.  He uses talcum powder every day.  May have had Freon leak from his HVAC system. Likes to play golf.   Smoking history: 30-pack-year smoker.  Quit in 1980 Travel history: No significant travel history Relevant family history: No significant family history of lung disease.  Outpatient Encounter Medications as of 10/07/2023  Medication Sig   acetaminophen  (TYLENOL ) 500 MG tablet Take 1,000 mg by mouth every 6 (six) hours as needed for mild pain (pain score 1-3).   albuterol  (VENTOLIN  HFA) 108 (90 Base) MCG/ACT inhaler Inhale 1-2 puffs into the lungs every 4 (four) hours as needed for shortness of breath or wheezing.   ALPRAZolam  (XANAX ) 0.5 MG tablet Take 0.25-0.5 mg by mouth 3 (three) times daily as needed for sleep.   azaTHIOprine  (IMURAN ) 50 MG tablet Take 3 tablets (150 mg total) by mouth daily.   doxazosin  (CARDURA ) 4 MG tablet Take 4 mg by mouth at bedtime.     fluticasone  (FLONASE ) 50 MCG/ACT nasal spray Place 2 sprays into both nostrils daily.   guaiFENesin  (MUCINEX ) 600 MG 12 hr tablet Take 600 mg by mouth 2 (two) times daily as needed for to loosen phlegm or cough.   lipase/protease/amylase (CREON ) 36000 UNITS CPEP capsule Take 2 capsules (72,000 Units total) by mouth 3 (three) times daily with meals. May also take 1 capsule (36,000 Units total) as needed (with snacks - up to 4 snacks daily).   losartan (COZAAR) 25 MG tablet Take 25 mg by mouth daily.   metoprolol   tartrate (LOPRESSOR ) 50 MG tablet Take 25 mg by mouth 2 (two) times daily.   Nintedanib (OFEV ) 150 MG CAPS Take 1 capsule (150 mg total) by mouth 2 (two) times daily.   pantoprazole  (PROTONIX ) 40 MG tablet Take 1 tablet (40 mg total) by mouth daily.   pravastatin  (PRAVACHOL ) 20 MG tablet Take 20 mg by mouth every evening.    simethicone  (MYLICON) 125 MG chewable tablet Chew 250 mg by mouth every 6 (six) hours as needed for flatulence.   No facility-administered encounter medications on file as of 10/07/2023.   Vitals:   10/07/23 0906  BP: 108/60  Pulse: (!) 57  Temp: 98.4 F (36.9 C)  Height: 5' 11 (1.803 m)  Weight: 223 lb (101.2 kg)  SpO2: 99% Comment: 3L POC  TempSrc: Oral  BMI (Calculated): 31.12    Physical Exam MEASUREMENTS: Weight- 223.8. GEN: No acute distress CV: Regular rate and rhythm, no murmurs LUNGS: Clear to auscultation bilaterally, normal respiratory effort SKIN JOINTS: Warm and dry, no rash    Data Reviewed: Imaging: High-resolution CT 12/24/2018-mild emphysema, traction bronchiectasis, patchy groundglass and reticulation.  More prominent in the upper lobes with no basal gradient.  Air-trapping present. High-res CT 09/19/2019-stable to minimally changed interstitial lung disease in alternate pattern  CT 03/25/2021-interstitial lung disease with superimposed groundglass opacities in the right upper and left lower lobe.  High-resolution CT 09/12/2021-upper lobe predominant fibrotic interstitial lung disease and alternate  pattern.  High resolution CT 11/28/2022-stable/minimally progressive findings of interstitial lung disease in alternate pattern.  High-resolution CT 08/10/2023-progressive fibrotic changes in alternate pattern I have reviewed the images personally.  PFTs: 01/06/2019 FVC 3 [66%], FEV1 2.61 [78%], F/F 87, TLC 4.33 [59%], DLCO 22.56 [85%] Severe restriction with no obstruction.  Diffusion capacity is normal  10/03/2019 FVC 2.90 [64%], FEV1 2.47  [94%], DLCO 21.35 [81%] Moderate restriction  08/29/2020 FVC 2.14 [1%], FEV1 2.42 [74%], TLC 4.55 [62%], DLCO 19.75 [75%] Moderate restriction, mild diffusion defect  01/08/2022 FVC 2.29 [52%], FEV1 2.05 [64%], F/F89, TLC 3.70 [51%], DLCO 14.84 [57%]  11/13/2022 FVC 2.22 [50%], FEV1 2.00 [62%], F/F90, TLC 3.92 [54%], DLCO 11.54 [44%] Moderate restriction, moderate diffusion defect  07/10/2023 FVC 1.91 [44%], FEV1 1.77 [56%], F/F93, TLC 2.69 [37%], DLCO 9.11 [35%] Severe restriction, severe diffusion defect.  Diffusion defect is not reliable due to not reproducibility  Labs: Hypersensitivity panel 12/28/2018- Negative CTD profile 02/10/2019- Negative  Sleep: Home sleep study 08/03/2021 Moderate OSA with AHI 28.6, low O2 sat of 81%. Assessment & Plan Interstitial lung disease CT scan reviewed with alternate pattern of pulmonary fibrosis with upper lobe predominance.  This could be hypersensitivity pneumonitis though he does not have any significant exposures. Lab work including CTD serologies, hypersensitivity panel is negative.  Has remote exposure to asbestos but CT scan is not typical of asbestosis.  We had multiple discussions for further work-up in detail with patient, including wait and watch with monitoring, bronchoscope with BAL, transbronchial biopsies or surgical lung biopsy.  He is very reluctant to undergo an invasive procedure. Per multidisciplinary conference recommendations for biopsy, treatment with antifibrotic's +/- anti-inflammatory.  After long discussion with patient and family we have decided to start treatment with antifibrotics in 2023 He is tolerating Ofev  well so far.  CellCept  tried 2024 but developed a rash. Myfortic  was poorly tolerated as well. On azathioprine  dose at 150 mg/day.   Progressive decline noted today in lung function with decreased lung volumes and diffusion capacity. Recent COVID-19 infection in March 2025 may have exacerbated the condition.    Mild pulmonary hypertension Right heart catheterization indicates very mild pulmonary hypertension. Lung scarring is progressing, as seen in the July scan. He experiences shortness of breath and requires oxygen  during exertion. Early treatment is recommended to prevent further complications, as scarring can increase pressure on the right side of the heart. - Start inhaled treprostinil to manage pulmonary hypertension and improve symptoms of shortness of breath. - Check liver tests and blood counts today. - Adjust oxygen  levels as needed during exertion, allowing increase up to 5 L/min. - Repeat lung function test in three months. - He has already finished pulmonary rehab and is due to start another session next month. - Continue supplemental oxygen   Exocrine pancreatic insufficiency with diarrhea and weight loss Irritable bowel syndrome Exocrine pancreatic insufficiency confirmed after colonoscopy and pathology ruled out infection. Currently on Creon , with weight stabilization but intermittent diarrhea. Ofev , which can contribute to diarrhea, is continued. Creon  dosage increased to four pills with meals and two with snacks. - Continue Creon  with increased dosage to four pills with meals. - Consider carob powder if Creon  does not adequately control diarrhea. - Continue Ofev  at 150 mg twice daily. - Monitor weight and symptoms of diarrhea. Discussed alternative medication pirfenidone, which does not have diarrhea as a side effect, but prefers to continue with Ofev  due to established approval and setup.   OSA He has diagnosis of OSA 30 years  ago.  On auto CPAP with supplemental oxygen  Start supplemental oxygen  with CPAP  Plan/Recommendations: - Check  labs for monitoring - Continue Ofev , azathioprine  - Continue supplemental oxygen  - Initiate paperwork for inhaled treprostinil  Knute Mazzuca MD [facility] 10/07/2023, 9:15 AM  CC: Bertell Satterfield, MD

## 2023-10-07 NOTE — Telephone Encounter (Signed)
 Called to confirm/remind patient of their appointment at the [facility] on 10/07/23.   Appointment:   [] Confirmed  [x] Left mess   [] No answer/No voice mail  [] VM Full/unable to leave message  [] Phone not in service  Patient reminded to bring all medications and/or complete list.  Confirmed patient has transportation. Gave directions, instructed to utilize valet parking.

## 2023-10-08 ENCOUNTER — Ambulatory Visit (HOSPITAL_COMMUNITY)
Admission: RE | Admit: 2023-10-08 | Discharge: 2023-10-08 | Disposition: A | Discharge status: Home / Self Care | Source: Ambulatory Visit | Attending: Internal Medicine | Admitting: Internal Medicine

## 2023-10-08 ENCOUNTER — Encounter (HOSPITAL_COMMUNITY): Payer: Self-pay | Admitting: Internal Medicine

## 2023-10-08 VITALS — BP 108/62 | HR 64 | Ht 71.0 in | Wt 225.8 lb

## 2023-10-08 DIAGNOSIS — I480 Paroxysmal atrial fibrillation: Secondary | ICD-10-CM | POA: Insufficient documentation

## 2023-10-08 DIAGNOSIS — G4733 Obstructive sleep apnea (adult) (pediatric): Secondary | ICD-10-CM | POA: Insufficient documentation

## 2023-10-08 DIAGNOSIS — E785 Hyperlipidemia, unspecified: Secondary | ICD-10-CM | POA: Insufficient documentation

## 2023-10-08 DIAGNOSIS — I2721 Secondary pulmonary arterial hypertension: Secondary | ICD-10-CM | POA: Diagnosis not present

## 2023-10-08 DIAGNOSIS — J849 Interstitial pulmonary disease, unspecified: Secondary | ICD-10-CM | POA: Insufficient documentation

## 2023-10-08 DIAGNOSIS — R002 Palpitations: Secondary | ICD-10-CM | POA: Diagnosis not present

## 2023-10-08 DIAGNOSIS — I471 Supraventricular tachycardia, unspecified: Secondary | ICD-10-CM | POA: Insufficient documentation

## 2023-10-08 DIAGNOSIS — I10 Essential (primary) hypertension: Secondary | ICD-10-CM | POA: Diagnosis not present

## 2023-10-08 DIAGNOSIS — I4891 Unspecified atrial fibrillation: Secondary | ICD-10-CM

## 2023-10-08 DIAGNOSIS — Z87891 Personal history of nicotine dependence: Secondary | ICD-10-CM | POA: Diagnosis not present

## 2023-10-08 NOTE — Progress Notes (Signed)
 [facility] Note   Referring Physician: PCP: Nichole Senior, MD PCP-Cardiologist: Maude Emmer, MD   Chief Complaint: Atrial fibrillation  HPI:  78 year old male history of hyperlipidemia hypertension, OSA and interstitial lung disease.  Is been followed by Dr. Nishan for history of palpitations and elevated coronary artery calcium score.  He was referred for right heart catheterization on September 02, 2023 as part of the workup for pulmonary hypertension in the setting of ILD.  Right heart cath showed very mild PAH RA 3 PA 35/17 with a mean of 25 wedge pressure was 7 thermodilution cardiac output was 6.5 with a cardiac index of 2.9.  PVR was 2.8 Wood units by thermodilution and 3.5 by Fick.  During his right heart cath developed atrial fibrillation during catheter manipulation in the RV.  I gave him 1 dose of flecainide  with prompt conversion to sinus rhythm.  He has no history of previous atrial fibrillation.  He recently wore a Zio patch for palpitations.  This showed predominantly sinus rhythm with 21 runs of SVT there was also evidence of atrial fibrillation less than 1% burden ranging from 84 to 116 bpm.  There were rare PACs and PVCs.  Atrial fibrillation was only noted at the onset of this device placement while in the hospital.  After converting to sinus rhythm on flecainide  with pill in the pocket approach there was no more atrial fibrillation.     Past Medical History:  Diagnosis Date   Anxiety    Arthritis    back   Back pain, chronic    Colon polyp    Diverticulitis    GERD (gastroesophageal reflux disease)    Heart palpitations    High cholesterol    Hypertension    Irritable bowel syndrome    Osteoporosis    Psoriasis    Pulmonary fibrosis (HCC)    Pulmonary fibrosis (HCC)    Sleep apnea    c- pap    Current Outpatient Medications  Medication Sig Dispense Refill   acetaminophen  (TYLENOL ) 500 MG tablet Take 1,000 mg by mouth every 6 (six)  hours as needed for mild pain (pain score 1-3).     albuterol  (VENTOLIN  HFA) 108 (90 Base) MCG/ACT inhaler Inhale 1-2 puffs into the lungs every 4 (four) hours as needed for shortness of breath or wheezing. 6.7 g 5   ALPRAZolam  (XANAX ) 0.5 MG tablet Take 0.25-0.5 mg by mouth 3 (three) times daily as needed for sleep.     azaTHIOprine  (IMURAN ) 50 MG tablet Take 3 tablets (150 mg total) by mouth daily. 270 tablet 3   doxazosin  (CARDURA ) 4 MG tablet Take 4 mg by mouth at bedtime.       fluticasone  (FLONASE ) 50 MCG/ACT nasal spray Place 2 sprays into both nostrils daily. 16 g 0   guaiFENesin  (MUCINEX ) 600 MG 12 hr tablet Take 600 mg by mouth 2 (two) times daily as needed for to loosen phlegm or cough.     lipase/protease/amylase (CREON ) 36000 UNITS CPEP capsule Take 2 capsules (72,000 Units total) by mouth 3 (three) times daily with meals. May also take 1 capsule (36,000 Units total) as needed (with snacks - up to 4 snacks daily). 300 capsule 5   losartan (COZAAR) 25 MG tablet Take 25 mg by mouth daily.     metoprolol  tartrate (LOPRESSOR ) 50 MG tablet Take 25 mg by mouth 2 (two) times daily.     Nintedanib (OFEV ) 150 MG CAPS Take 1 capsule (150 mg total) by mouth  2 (two) times daily. 180 capsule 1   pantoprazole  (PROTONIX ) 40 MG tablet Take 1 tablet (40 mg total) by mouth daily. 30 tablet 1   pravastatin  (PRAVACHOL ) 20 MG tablet Take 20 mg by mouth every evening.      simethicone  (MYLICON) 125 MG chewable tablet Chew 250 mg by mouth every 6 (six) hours as needed for flatulence.     No current facility-administered medications for this encounter.    Allergies  Allergen Reactions   Cephalexin Other (See Comments)    Fever    Iodinated Contrast Media Hives, Rash and Dermatitis    Had persistent rash with only 50mg  benadryl  prior to spinal injection on 09/03/2017, try 13 hr prep in future.   Prednisone  Hives and Itching      Social History   Socioeconomic History   Marital status: Married     Spouse name: Not on file   Number of children: 2   Years of education: Not on file   Highest education level: Not on file  Occupational History   Occupation: retired    Comment: Retired  Tobacco Use   Smoking status: Former    Current packs/day: 0.00    Average packs/day: 2.0 packs/day for 11.0 years (22.0 ttl pk-yrs)    Types: Cigarettes    Start date: 05/22/1962    Quit date: 05/21/1973    Years since quitting: 50.4   Smokeless tobacco: Never   Tobacco comments:    Quit 25 yrs now   Vaping Use   Vaping status: Never Used  Substance and Sexual Activity   Alcohol use: Not Currently    Alcohol/week: 14.0 standard drinks of alcohol    Types: 14 Shots of liquor per week    Comment: no ETOH in 4 months as of 09/16/2023   Drug use: No   Sexual activity: Yes    Partners: Female  Other Topics Concern   Not on file  Social History Narrative   Has dog named Advice worker   Social Drivers of Corporate investment banker Strain: Not on file  Food Insecurity: Not on file  Transportation Needs: Not on file  Physical Activity: Not on file  Stress: Not on file  Social Connections: Not on file  Intimate Partner Violence: Not on file      Family History  Problem Relation Age of Onset   Arrhythmia Mother        Atrial fib   Arrhythmia Sister        Atrial fib   Stroke Sister    Breast cancer Sister    Bladder Cancer Father            Colon cancer Neg Hx    Stomach cancer Neg Hx    Esophageal cancer Neg Hx    Pancreatic cancer Neg Hx    Prostate cancer Neg Hx    Rectal cancer Neg Hx     Vitals:   10/08/23 1124  BP: 108/62  Pulse: 64  SpO2: 98%  Weight: 102.4 kg (225 lb 12.8 oz)  Height: 5' 11 (1.803 m)     PHYSICAL EXAM: General: Elderly male O2 through nasal cannula. No respiratory difficulty HEENT: normal nasal cannula present. Neck: supple. no JVD. Carotids 2+ bilat; no bruits. No lymphadenopathy or thyromegaly appreciated. Cor: PMI nondisplaced. Regular rate &  rhythm. No rubs, gallops or murmurs. Lungs: clear with left lower and left middle lobe dry crackles. Abdomen: soft, nontender, nondistended. No hepatosplenomegaly. No bruits or masses. Good bowel  sounds. Extremities: no cyanosis, clubbing, rash, edema Neuro: alert & oriented x 3, cranial nerves grossly intact. moves all 4 extremities w/o difficulty. Affect pleasant.  ECG: ECG from 09/03/2023 shows atrial fibrillation at 100 bpm.  No significant ST-T wave abnormalities.  Personally reviewed   ASSESSMENT & PLAN:  1. PAF - He developed atrial fibrillation during right heart cath last month.  This was due to catheter manipulation.  He recently wore a Zio patch which showed a low burden of atrial fibrillation but this all occurred in the hospital after his procedure prior to being converted with flecainide  with a pill in the pocket approach.  He did not have any atrial fibrillation at home. - Given his underlying lung disease, frequent SVT and age I do think he is at high risk to develop atrial fibrillation. His CHA2DS2-VASc score is 4 due to his age, hypertension presence of coronary artery calcification on CT.  This gives him a 4.0% risk of stroke every year in the setting of atrial fibrillation.  Based on this I have recommended that we place an implantable loop recorder to surveil for atrial fibrillation.  I discussed this with Dr. Almetta in EP who is agreed to evaluate him. - He will continue to follow-up with Dr. Nishan  2.  Palpitations/SVT - ZIO monitor as above.  Refer to EP for implantable loop recorder for ongoing monitoring  3. PAH in setting of ILD - This is relatively mild.  He is pending a trial of inhaled treprostinil to see if it improves his symptoms.  4. HTN - well-controlled  Toribio Fuel, MD  11:41 AM     Toribio Fuel, MD 10/08/23

## 2023-10-08 NOTE — Patient Instructions (Addendum)
 Thank you for your visit  today.  You have been referred to Dr.Carlisle. his office will call you to arrange your appointment.  Follow up with Dr. Nishan as scheduled.  If you have any questions or concerns before your next appointment please send us  a message through [place] or call our office at [phone].    TO LEAVE A MESSAGE FOR THE NURSE SELECT OPTION 2, PLEASE LEAVE A MESSAGE INCLUDING: YOUR NAME DATE OF BIRTH CALL BACK NUMBER REASON FOR CALL**this is important as we prioritize the call backs  YOU WILL RECEIVE A CALL BACK THE SAME DAY AS LONG AS YOU CALL BEFORE 4:00 PM  At the [facility], you and your health needs are our priority. As part of our continuing mission to provide you with exceptional heart care, we have created designated Provider Care Teams. These Care Teams include your primary Cardiologist (physician) and Advanced Practice Providers (APPs- Physician Assistants and Nurse Practitioners) who all work together to provide you with the care you need, when you need it.   You may see any of the following providers on your designated Care Team at your next follow up: Dr Toribio Fuel Dr Ezra Shuck Dr. Ria Commander Dr. Morene Brownie Amy Lenetta, NP Caffie Shed, [place] [facility] [place], [place] Beckey Coe, NP [place] Lee, NP Ellouise Class, NP Tinnie Redman, PharmD Jaun Bash, PharmD   Please be sure to bring in all your medications bottles to every appointment.    Thank you for choosing [facility]

## 2023-10-12 ENCOUNTER — Telehealth: Payer: Self-pay

## 2023-10-12 ENCOUNTER — Ambulatory Visit: Payer: Self-pay | Admitting: Pulmonary Disease

## 2023-10-12 NOTE — Telephone Encounter (Signed)
 Submitted a Prior Authorization request to [employer] for TYVASO DPI via CoverMyMeds. Will update once we receive a response.  Key: [id number]

## 2023-10-13 ENCOUNTER — Other Ambulatory Visit (HOSPITAL_COMMUNITY): Payer: Self-pay

## 2023-10-13 NOTE — Telephone Encounter (Signed)
 Received notification from [facility] [employer]RX [employer] regarding a prior authorization for TYVASO DPI. Authorization has been APPROVED from 10/12/23 to 02/03/24. Have not yet received approval letter but will send to scan center once received.  Per test claim, copay for 28 days supply is $108.80.  Authorization # [id number] Phone # [phone]

## 2023-10-13 NOTE — Telephone Encounter (Signed)
 Spoke to the patient and he expressed he would like to proceed with applying for patient assistance. Will complete Tyvaso enrollment form.

## 2023-10-14 DIAGNOSIS — J849 Interstitial pulmonary disease, unspecified: Secondary | ICD-10-CM | POA: Diagnosis not present

## 2023-10-16 NOTE — Telephone Encounter (Signed)
 Received Tyvaso new start paperwork. Completed form and faxed with clinicals, prior authorization approval and insurance card copy to [employer].   Phone#: [phone] Fax#: [phone]

## 2023-10-19 NOTE — Telephone Encounter (Signed)
 Received call from [facility] regarding target dose clarification. Target dose 64 mcg per session.   [name], PharmD, BCPS Clinical Pharmacist  [facility]

## 2023-10-19 NOTE — Telephone Encounter (Signed)
 Received fax from [facility] - rx for Tyvaso has been triaged to [facility] (phone [phone])  Patient ID: [id number]

## 2023-10-23 ENCOUNTER — Telehealth: Payer: Self-pay

## 2023-10-23 NOTE — Telephone Encounter (Signed)
 Copied from CRM [id number]. Topic: Clinical - Prescription Issue >> Oct 23, 2023 10:47 AM Isabell A wrote: Reason for CRM: Harlene from [facility] needs clarification on how many refills for Tyvaso.   Callback number: [phone]  Fax number: [phone]

## 2023-10-23 NOTE — Telephone Encounter (Signed)
 Spoke with Olan, pharmacist at [facility]. Clarified refills for maintenance Rx: 5 refills.   [name], PharmD, BCPS Clinical Pharmacist  [facility]

## 2023-10-26 NOTE — Telephone Encounter (Signed)
 Copied from CRM [id number]. Topic: Clinical - Prescription Issue >> Oct 23, 2023 10:47 AM Isabell A wrote: Reason for CRM: Harlene from [facility] needs clarification on how many refills for Tyvaso.      Spoke with Olan, pharmacist at [facility]. Clarified refills for maintenance Rx: 5 refills.    [name], PharmD, BCPS Clinical Pharmacist  [facility]

## 2023-10-27 DIAGNOSIS — G4733 Obstructive sleep apnea (adult) (pediatric): Secondary | ICD-10-CM | POA: Diagnosis not present

## 2023-10-27 DIAGNOSIS — I10 Essential (primary) hypertension: Secondary | ICD-10-CM | POA: Diagnosis not present

## 2023-10-30 ENCOUNTER — Telehealth: Payer: Self-pay

## 2023-10-30 NOTE — Telephone Encounter (Signed)
 Copied from CRM #[id number]. Topic: Clinical - Prescription Issue >> Oct 30, 2023  9:59 AM Rozanna MATSU wrote: Reason for CRM: Tillman with [facility] [phone] is needing some information on this med Tyvaso DPI for patient.

## 2023-10-30 NOTE — Telephone Encounter (Signed)
 Reached out to [place], she says she is needing to know how often he will be changing his titrated dose such as every week or every 1-2 weeks as tolerated? I can call her back and let her know.

## 2023-11-02 NOTE — Telephone Encounter (Signed)
 Called Alexander Nunez and provided her the titration instructions.

## 2023-11-04 NOTE — Telephone Encounter (Signed)
 Received fax from Tillman, RN at [facility].

## 2023-11-04 NOTE — Addendum Note (Signed)
 Addended by: DAYNE SHERRY RAMAN on: 11/04/2023 08:26 AM   Modules accepted: Orders

## 2023-11-05 NOTE — Telephone Encounter (Signed)
 Received fax from [facility] requesting provider's signature on Plan of Treatment document. Stamped signature and faxed it back to [phone].

## 2023-11-10 ENCOUNTER — Ambulatory Visit (INDEPENDENT_AMBULATORY_CARE_PROVIDER_SITE_OTHER): Admitting: Gastroenterology

## 2023-11-11 ENCOUNTER — Telehealth: Payer: Self-pay

## 2023-11-11 NOTE — Telephone Encounter (Signed)
 Received fax from [facility] regarding Tyvaso side effects (Headache).   Patient visit report from RN states patient is doing well on Tyvaso DPI and increased to 32 mcg on 11/09/23. Patient states he is experiencing headaches in the morning that last for a very short amount of time. Patient states he started experiencing the headaches on 11/04/23. Patient is aware to titrate every 1-2 weeks as tolerated to reach a goal o f64 mcg. Patient is aware to call myself Patria Lopes, RN, BSN], his MDO or the specialty pharmacy with any questions or concerns.  [name], PharmD, BCPS, CPP Clinical Pharmacist  [facility]

## 2023-11-13 DIAGNOSIS — J849 Interstitial pulmonary disease, unspecified: Secondary | ICD-10-CM | POA: Diagnosis not present

## 2023-11-18 ENCOUNTER — Encounter (INDEPENDENT_AMBULATORY_CARE_PROVIDER_SITE_OTHER): Payer: Self-pay | Admitting: Gastroenterology

## 2023-12-04 DIAGNOSIS — E785 Hyperlipidemia, unspecified: Secondary | ICD-10-CM | POA: Diagnosis not present

## 2023-12-04 DIAGNOSIS — E042 Nontoxic multinodular goiter: Secondary | ICD-10-CM | POA: Diagnosis not present

## 2023-12-04 DIAGNOSIS — I10 Essential (primary) hypertension: Secondary | ICD-10-CM | POA: Diagnosis not present

## 2023-12-04 DIAGNOSIS — Z1212 Encounter for screening for malignant neoplasm of rectum: Secondary | ICD-10-CM | POA: Diagnosis not present

## 2023-12-06 NOTE — Progress Notes (Unsigned)
 Cardiology Office Note   Date:  12/09/2023  ID:  Dysen, Edmondson 1945/08/17, MRN [id number] PCP: Nichole Senior, MD  [facility] Providers Cardiologist:  Maude Emmer, MD Electrophysiologist:  Donnice DELENA Primus, MD   History of Present Illness Alexander Nunez is a 78 y.o. male with mild PAH, ILD, HTN, HLD and OSA who presents for ILR implant to assess for recurrent AF.   He was evaluated by Dr. Cherrie on 10/08/23 in clinic following evaluation for [facility] on 09/02/2023 at which time he had AF during the RHC.  He was given flecainide  with conversion to sinus rhythm and discharged with Zio patch which only showed AF immediately following his RHC but none after conversion with flecainide .   Since his hospitalization PAH evaluation he has had no recurrent arrhythmias or palpitations.  He presents today for ILR implant for ongoing arrhythmia monitoring.  ROS: none  Studies Reviewed  ECG review 09/02/23: (10:53:05), AF/VR 100, QRS 96, QT/c 354/456 09/02/23: (08:27:29), SB 51, PR 182, QRS 102, QT/c 414/381 06/11/23: NSR 65, PR 181, QRS 120, QT/c 405/422, atypical RBBB  Zio monitor Result date: 09/02/23-09/16/23 Patient had a min HR of 40 bpm, max HR of 150 bpm, and avg HR of 57 bpm. Predominant underlying rhythm was Sinus Rhythm. 21 Supraventricular Tachycardia runs occurred, the run with the fastest interval lasting 4 beats with a max rate of 150 bpm, the  longest lasting 11 beats with an avg rate of 109 bpm. Atrial Fibrillation occurred (<1% burden), ranging from 84-116 bpm (avg of 99 bpm), the longest lasting 2 mins 22 secs with an avg rate of 99 bpm. True duration of Atrial Fibrillation episodes  difficult to ascertain due to the presence of artifact. Atrial Fibrillation was present at activation of device. Once patient converted to sinus there were no further episodes of AF. Isolated SVEs were rare (<1.0%), SVE Couplets were rare (<1.0%), and SVE Triplets were rare (<1.0%).  Isolated VEs were rare (<1.0%), and no  VE Couplets or VE Triplets were present.  RHC Result date: 09/02/23 RA = 3 RV = 32/3 PA = 35/17 (25) PCW = 7 Fick cardiac output/index = 5.0/2.2 Thermo CO/CI = 6.5/2.9 PVR = 2.8 WU (thermo) 3.5 (Fick) Ao sat = 99% PA sat = 62%, 65% SVC = 63% PAPi = 6.0 Assessment: 1. Very mild PAH with normal left-sided filling pressures and outputs (suspect Thermodilution more accurate) 2. Patient developed AF with catheter manipulation in RA.  Plan/Discussion:  Minimal PAH. Procedurally related AF. Will treat with flecainide  x 1 and place Zio x 14 days. Will not start AC if breaks with flecainide  given no previous h/o AF.   TTE Result date: 03/26/21  1. Left ventricular ejection fraction, by estimation, is 60 to 65%. The  left ventricle has normal function. The left ventricle has no regional  wall motion abnormalities. There is mild left ventricular hypertrophy.  Left ventricular diastolic parameters  are consistent with Grade I diastolic dysfunction (impaired relaxation).   2. Right ventricular systolic function is normal. The right ventricular  size is normal.   3. The mitral valve was not well visualized. No evidence of mitral valve  regurgitation.   4. The aortic valve was not well visualized. Aortic valve regurgitation  is not visualized.   Physical Exam VS:  BP 102/62   Pulse 64   Ht 5' 11 (1.803 m)   Wt 218 lb (98.9 kg)   SpO2 93%   BMI 30.40 kg/m  Wt Readings from Last 3 Encounters:  12/09/23 219 lb (99.3 kg)  12/09/23 218 lb (98.9 kg)  10/08/23 225 lb 12.8 oz (102.4 kg)    GEN: Well nourished, well developed in no acute distress, on 3 L home O2 NECK: No JVD; No carotid bruits CARDIAC: RRR, no murmurs, rubs, gallops RESPIRATORY:  Clear to auscultation without rales, wheezing or rhonchi  EXTREMITIES:  No edema; No deformity   ASSESSMENT AND PLAN Alexander Nunez is a 78 y.o. male with mild PAH, ILD, HTN, HLD and OSA who  presents for ILR implant to assess for recurrent AF.   Lone AF BSCI ILR implant  Isolated AF in the setting of RHC and postprocedural care.  Required flecainide  for termination.  Not currently on [facility] without any recurrent arrhythmias.  Successful ILR implant today. - leaving dressing on 3 days then remove - Dermabond over incision  Dispo:  - f/u PRN if arrhythmias captured on ILR   A total of 25 minutes was spent preparing for the patient, reviewing history, performing exam, document encounter, coordinating care and counseling the patient. 15 minutes was spent with direct patient care.   Signed, Donnice DELENA Primus, MD     Procedure:  Implantable Loop Monitor Implant   Indication: AF monitoring   Attending: Adina Primus, MD   Method: The patient was brought to the cardiac catheterization laboratory in the fasting, post-absorptive state. A time out was performed. The chest was prepped with chlorhexidine  scrub. Lidocaine  was administered at the planned incision site.  A 1 cm incision was made and the ILR was inserted with the implant tool. Adequate sensing was confirmed with the interrogator. The skin was closed with Dermabond. An adhesive dressing was applied over the incision.   Implanted Hardware: BSCI Lux-Dx II+, [id number], SN [id number], DOI 12/09/23  Summary: 1. Successful implant of a BSCI ILR    Recommendations: 2. F/U EP clinic as scheduled  Donnice DELENA Primus, MD [facility]  Cardiac Electrophysiology

## 2023-12-08 NOTE — Telephone Encounter (Signed)
 Received fax from [facility] requesting signature on maintenance dose prescription. Signed and returned to [facility]. Fax [phone]

## 2023-12-09 ENCOUNTER — Ambulatory Visit: Attending: Cardiology | Admitting: Student in an Organized Health Care Education/Training Program

## 2023-12-09 ENCOUNTER — Encounter: Payer: Self-pay | Admitting: Student in an Organized Health Care Education/Training Program

## 2023-12-09 ENCOUNTER — Ambulatory Visit: Admitting: Pulmonary Disease

## 2023-12-09 ENCOUNTER — Ambulatory Visit

## 2023-12-09 ENCOUNTER — Encounter: Payer: Self-pay | Admitting: Pulmonary Disease

## 2023-12-09 ENCOUNTER — Ambulatory Visit: Payer: Self-pay | Admitting: Pulmonary Disease

## 2023-12-09 VITALS — BP 102/62 | HR 64 | Ht 71.0 in | Wt 218.0 lb

## 2023-12-09 VITALS — BP 100/62 | HR 66 | Temp 98.0°F | Ht 71.0 in | Wt 219.0 lb

## 2023-12-09 DIAGNOSIS — K8681 Exocrine pancreatic insufficiency: Secondary | ICD-10-CM

## 2023-12-09 DIAGNOSIS — I480 Paroxysmal atrial fibrillation: Secondary | ICD-10-CM | POA: Diagnosis not present

## 2023-12-09 DIAGNOSIS — Z87891 Personal history of nicotine dependence: Secondary | ICD-10-CM

## 2023-12-09 DIAGNOSIS — J984 Other disorders of lung: Secondary | ICD-10-CM | POA: Diagnosis not present

## 2023-12-09 DIAGNOSIS — Z5181 Encounter for therapeutic drug level monitoring: Secondary | ICD-10-CM

## 2023-12-09 DIAGNOSIS — J9611 Chronic respiratory failure with hypoxia: Secondary | ICD-10-CM | POA: Diagnosis not present

## 2023-12-09 DIAGNOSIS — J849 Interstitial pulmonary disease, unspecified: Secondary | ICD-10-CM | POA: Diagnosis not present

## 2023-12-09 DIAGNOSIS — J9600 Acute respiratory failure, unspecified whether with hypoxia or hypercapnia: Secondary | ICD-10-CM | POA: Diagnosis not present

## 2023-12-09 DIAGNOSIS — R634 Abnormal weight loss: Secondary | ICD-10-CM | POA: Diagnosis not present

## 2023-12-09 DIAGNOSIS — J209 Acute bronchitis, unspecified: Secondary | ICD-10-CM

## 2023-12-09 DIAGNOSIS — G4733 Obstructive sleep apnea (adult) (pediatric): Secondary | ICD-10-CM | POA: Diagnosis not present

## 2023-12-09 DIAGNOSIS — I272 Pulmonary hypertension, unspecified: Secondary | ICD-10-CM | POA: Diagnosis not present

## 2023-12-09 DIAGNOSIS — K589 Irritable bowel syndrome without diarrhea: Secondary | ICD-10-CM | POA: Diagnosis not present

## 2023-12-09 DIAGNOSIS — I517 Cardiomegaly: Secondary | ICD-10-CM | POA: Diagnosis not present

## 2023-12-09 DIAGNOSIS — R197 Diarrhea, unspecified: Secondary | ICD-10-CM | POA: Diagnosis not present

## 2023-12-09 LAB — COMPREHENSIVE METABOLIC PANEL WITH GFR
ALT: 6 U/L (ref 0–53)
AST: 15 U/L (ref 0–37)
Albumin: 4 g/dL (ref 3.5–5.2)
Alkaline Phosphatase: 43 U/L (ref 39–117)
BUN: 20 mg/dL (ref 6–23)
CO2: 32 meq/L (ref 19–32)
Calcium: 9.5 mg/dL (ref 8.4–10.5)
Chloride: 100 meq/L (ref 96–112)
Creatinine, Ser: 1.23 mg/dL (ref 0.40–1.50)
GFR: 56.59 mL/min — ABNORMAL LOW (ref >60.00)
Glucose, Bld: 95 mg/dL (ref 70–99)
Potassium: 4.3 meq/L (ref 3.5–5.1)
Sodium: 137 meq/L (ref 135–145)
Total Bilirubin: 0.4 mg/dL (ref 0.2–1.2)
Total Protein: 7.7 g/dL (ref 6.0–8.3)

## 2023-12-09 LAB — CBC WITH DIFFERENTIAL/PLATELET
Basophils Absolute: 0.1 K/uL (ref 0.0–0.1)
Basophils Relative: 0.7 % (ref 0.0–3.0)
Eosinophils Absolute: 0.3 K/uL (ref 0.0–0.7)
Eosinophils Relative: 2.9 % (ref 0.0–5.0)
HCT: 36.6 % — ABNORMAL LOW (ref 39.0–52.0)
Hemoglobin: 12 g/dL — ABNORMAL LOW (ref 13.0–17.0)
Lymphocytes Relative: 16.3 % (ref 12.0–46.0)
Lymphs Abs: 1.6 K/uL (ref 0.7–4.0)
MCHC: 32.8 g/dL (ref 30.0–36.0)
MCV: 99.9 fl (ref 78.0–100.0)
Monocytes Absolute: 0.8 K/uL (ref 0.1–1.0)
Monocytes Relative: 8.3 % (ref 3.0–12.0)
Neutro Abs: 7.2 K/uL (ref 1.4–7.7)
Neutrophils Relative %: 71.8 % (ref 43.0–77.0)
Platelets: 288 K/uL (ref 150.0–400.0)
RBC: 3.67 Mil/uL — ABNORMAL LOW (ref 4.22–5.81)
RDW: 15.3 % (ref 11.5–15.5)
WBC: 10 K/uL (ref 4.0–10.5)

## 2023-12-09 LAB — BRAIN NATRIURETIC PEPTIDE: Pro B Natriuretic peptide (BNP): 49 pg/mL (ref 0.0–100.0)

## 2023-12-09 MED ORDER — PREDNISONE 10 MG PO TABS: 40.0000 mg | ORAL_TABLET | Freq: Every day | ORAL | 0 refills | Status: AC

## 2023-12-09 MED ORDER — AZITHROMYCIN 250 MG PO TABS: 250.0000 mg | ORAL_TABLET | Freq: Every day | ORAL | 0 refills | Status: DC

## 2023-12-09 NOTE — Progress Notes (Signed)
 Alexander Nunez    [id number]    15-Sep-1945  Primary Care Physician:South, Garnette, MD  Referring Physician: Nichole Garnette, MD [address] [place],  [place] [address]  Problem list:  Follow-up for ILD, pulmonary fibrosis On Ofev  since Jan 2023 Cellcept  and myfortic  poorly tolerated in 2024 On Azathoprine since July 2024, dose increased to 100 mg in Oct 2024 and to 150mg  on June 2025 Started inhaled treprostinil September 2025  HPI: 78 y.o. with history of interstitial lung disease. hypertension, hyperlipidemia, sleep apnea, psoriasis  2020 Had an incidental finding of interstitial lung disease on CT abdomen which was done for diverticulitis He has seen Dr. Katherene Gelineau at [place] who did a high resolution CT with findings of alternate diagnosis, possible HP.  He had HP panel on 12/28/2018 which is negative. History notable for psoriasis with skin involvement.  He was on methotrexate for a couple of years and stopped as his skin rash improved.  Last dose of methotrexate was several years ago.  2021 Had COVID-19 infection December 2021, referred for monoclonal antibody  2023 Discussed at multidisciplinary conference on 10/15/2021 CTs show progressive interstitial lung disease and alternate pattern. Differential diagnoses include chronic HP versus NSIP.  Recommend a biopsy and would favor surgical lung biopsy as transbronchial and BAL may not give a definitive diagnosis. MDC see also recommends empiric treatment with antifibrotic's +/- anti-inflammatory if pathology is suggestive.  Started Ofev  in first week of Jan 2023.  He is tolerating it well so far with no issue Has started pulmonary rehab and is doing well States that breathing is better.  2024 Started on CellCept  in February 2024.  He developed rash on his skin immediately after.  He was treated with antifungal cream and discontinuation of CellCept  with improvement in symptoms.  The symptoms recurred after he  rechallenge himself with CellCept  and is now completely off the medication. Started my Myfortic  in April 2024 this was also poorly tolerated with weight loss and fatigue and he is off this medication as well Started azathioprine   2025 Developed COVID infection in March 2025, treated with Paxlovid Azathioprine  dose titrated up to 150 mg daily on June 2025 Cardiac cath June 2025 showed very mild PAH.  Started inhaled treprostinil September 2025  History of Present Illness Interim history: Alexander Nunez is a 78 year old male with pulmonary hypertension and interstitial lung disease who presents with worsening shortness of breath and low oxygen  levels. He is accompanied by his wife.  Dyspnea and hypoxemia - Worsening shortness of breath, particularly during activities - Oxygen  saturation drops to the 80s and sometimes into the 70s when oxygen  is removed briefly - Oxygen  levels improve with rest or CPAP use - Uses oxygen  at three liters per minute at rest, increasing to five liters per minute when active - CPAP use improves oxygen  levels more quickly  Cough and sputum production - Increased coughing, especially in the mornings - Mucus production that starts yellow and clears with continued coughing - No fevers  Medication effects and side effects - Started increased dose of inhaled treprostinil (Tyvaso) at 64 mcg a week and a half ago - Headaches since starting Tyvaso, managed with Tylenol  - Increased fatigue and lack of energy since starting Tyvaso - Takes nintedanib twice daily without issues   Relevant pulmonary history Pets: Has a dog.  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  His hobbies include gardening but no significant exposure to mold,No hot tub, Jacuzzi, no down pillows or  comforters.  He uses talcum powder every day.  May have had Freon leak from his HVAC system. Likes to play golf.   Smoking history: 30-pack-year smoker.  Quit in 1980 Travel history: No significant travel history Relevant family history: No significant family history of lung disease.  Outpatient Encounter Medications as of 12/09/2023  Medication Sig   acetaminophen  (TYLENOL ) 500 MG tablet Take 1,000 mg by mouth every 6 (six) hours as needed for mild pain (pain score 1-3).   albuterol  (VENTOLIN  HFA) 108 (90 Base) MCG/ACT inhaler Inhale 1-2 puffs into the lungs every 4 (four) hours as needed for shortness of breath or wheezing.   ALPRAZolam  (XANAX ) 0.5 MG tablet Take 0.25-0.5 mg by mouth 3 (three) times daily as needed for sleep.   azaTHIOprine  (IMURAN ) 50 MG tablet Take 3 tablets (150 mg total) by mouth daily.   fluticasone  (FLONASE ) 50 MCG/ACT nasal spray Place 2 sprays into both nostrils daily.   guaiFENesin  (MUCINEX ) 600 MG 12 hr tablet Take 600 mg by mouth 2 (two) times daily as needed for to loosen phlegm or cough.   lipase/protease/amylase (CREON ) 36000 UNITS CPEP capsule Take 2 capsules (72,000 Units total) by mouth 3 (three) times daily with meals. May also take 1 capsule (36,000 Units total) as needed (with snacks - up to 4 snacks daily).   losartan (COZAAR) 25 MG tablet Take 25 mg by mouth daily.   metoprolol  tartrate (LOPRESSOR ) 50 MG tablet Take 25 mg by mouth 2 (two) times daily.   Nintedanib (OFEV ) 150 MG CAPS Take 1 capsule (150 mg total) by mouth 2 (two) times daily.   pantoprazole  (PROTONIX ) 40 MG tablet Take 1 tablet (40 mg total) by mouth daily.   pravastatin  (PRAVACHOL ) 20 MG tablet Take 20 mg by mouth every evening.    simethicone  (MYLICON) 125 MG chewable tablet Chew 250 mg by mouth every 6 (six) hours as needed for flatulence.   Treprostinil (TYVASO DPI TITRATION KIT) 16 & 32 & 48 MCG POWD Inhale into the lungs in the morning, at noon, in the evening, and at bedtime.    doxazosin  (CARDURA ) 4 MG tablet Take 4 mg by mouth at bedtime.     No facility-administered encounter medications on file as of 12/09/2023.   Vitals:   12/09/23 1017  Height: 5' 11 (1.803 m)  Weight: 219 lb (99.3 kg)  SpO2: Comment: 3L POC  TempSrc: Oral  BMI (Calculated): 30.56    Physical Exam GEN: No acute distress CV: Regular rate and rhythm no murmurs LUNGS: Clear to auscultation bilaterally normal respiratory effort SKIN JOINTS: Warm and dry no rash    Data Reviewed: Imaging: High-resolution CT 12/24/2018-mild emphysema, traction bronchiectasis, patchy groundglass and reticulation.  More prominent in the upper lobes with no basal gradient.  Air-trapping present. High-res CT 09/19/2019-stable to minimally changed interstitial lung disease in alternate pattern  CT 03/25/2021-interstitial lung disease with superimposed groundglass opacities in the right upper and left lower lobe.  High-resolution CT 09/12/2021-upper lobe predominant fibrotic interstitial lung disease and alternate pattern.  High resolution CT 11/28/2022-stable/minimally progressive findings of interstitial lung disease in alternate pattern.  High-resolution CT 08/10/2023-progressive fibrotic changes in alternate pattern I have reviewed the images personally.  PFTs: 01/06/2019 FVC 3 [66%], FEV1 2.61 [78%], F/F 87, TLC 4.33 [59%], DLCO 22.56 [  85%] Severe restriction with no obstruction.  Diffusion capacity is normal  10/03/2019 FVC 2.90 [64%], FEV1 2.47 [94%], DLCO 21.35 [81%] Moderate restriction  08/29/2020 FVC 2.14 [1%], FEV1 2.42 [74%], TLC 4.55 [62%], DLCO 19.75 [75%] Moderate restriction, mild diffusion defect  01/08/2022 FVC 2.29 [52%], FEV1 2.05 [64%], F/F89, TLC 3.70 [51%], DLCO 14.84 [57%]  11/13/2022 FVC 2.22 [50%], FEV1 2.00 [62%], F/F90, TLC 3.92 [54%], DLCO 11.54 [44%] Moderate restriction, moderate diffusion defect  07/10/2023 FVC 1.91 [44%], FEV1 1.77 [56%], F/F93, TLC 2.69 [37%], DLCO 9.11  [35%] Severe restriction, severe diffusion defect.  Diffusion defect is not reliable due to not reproducibility  Labs: Hypersensitivity panel 12/28/2018- Negative CTD profile 02/10/2019- Negative  Sleep: Home sleep study 08/03/2021 Moderate OSA with AHI 28.6, low O2 sat of 81%. Assessment & Plan Interstitial lung disease CT scan reviewed with alternate pattern of pulmonary fibrosis with upper lobe predominance.  This could be hypersensitivity pneumonitis though he does not have any significant exposures. Lab work including CTD serologies, hypersensitivity panel is negative.  Has remote exposure to asbestos but CT scan is not typical of asbestosis.  We had multiple discussions for further work-up in detail with patient, including wait and watch with monitoring, bronchoscope with BAL, transbronchial biopsies or surgical lung biopsy.  He is very reluctant to undergo an invasive procedure. Per multidisciplinary conference recommendations for biopsy, treatment with antifibrotic's +/- anti-inflammatory.  After long discussion with patient and family we have decided to start treatment with antifibrotics in 2023 He is tolerating Ofev  well so far.  CellCept  tried 2024 but developed a rash. Myfortic  was poorly tolerated as well. On azathioprine  dose at 150 mg/day.  Order follow-up CT and PFTs  Mild pulmonary hypertension Right heart catheterization indicates very mild pulmonary hypertension. Lung scarring is progressing, as seen in the July scan. He experiences shortness of breath and requires oxygen  during exertion. Early treatment is recommended to prevent further complications, as scarring can increase pressure on the right side of the heart. -Currently on inhaled treprostinil (Tyvaso) at 64 mcg, the highest dose, which may help with pulmonary hypertension and potentially control scarring. Concerns about progression of interstitial lung disease. Lung transplant not considered due to age and personal  preference.  Exocrine pancreatic insufficiency with diarrhea and weight loss Irritable bowel syndrome Exocrine pancreatic insufficiency confirmed after colonoscopy and pathology ruled out infection. Currently on Creon , with weight stabilization but intermittent diarrhea. Ofev , which can contribute to diarrhea, is continued. Creon  dosage increased to four pills with meals and two with snacks. - Continue Creon  with increased dosage to four pills with meals. - Consider carob powder if Creon  does not adequately control diarrhea. - Continue Ofev  at 150 mg twice daily. - Monitor weight and symptoms of diarrhea. Discussed alternative medication pirfenidone, which does not have diarrhea as a side effect, but prefers to continue with Ofev  due to established approval and setup.  Acute bronchitis Increased cough and yellow mucus production, suggestive of acute bronchitis. No fever reported. Previous prednisone  use caused mild skin irritation, but he is willing to tolerate it. - Prescribed antibiotic for suspected bronchitis. - Prescribed prednisone  for 5 days despite previous skin irritation. - Chest x-ray today  Chronic hypoxemic respiratory failure Oxygen  levels dropping to the 70s with minimal exertion. Currently on 3 L of oxygen  at home, increased to 5 L when ambulating. Oxygen  levels improve with CPAP use. Recent cardiac implant may aid breathing, but no immediate improvement noted. - Provided oxygen  tank to prevent running out of power.  OSA He has diagnosis of OSA 30 years ago.  On auto CPAP with supplemental oxygen  Start supplemental oxygen  with CPAP  Plan/Recommendations: - Check  labs for monitoring - Continue nintedanib, azathioprine , inhaled treprostinil - Continue supplemental oxygen  - Z-Pak, prednisone  for 5 days, chest x-ray for acute bronchitis  I personally spent a total of 45 minutes in the care of the patient today including preparing to see the patient, getting/reviewing  separately obtained history, performing a medically appropriate exam/evaluation, counseling and educating, placing orders, independently interpreting results, and communicating results.   Lonna Coder MD [facility] 12/09/2023, 10:19 AM  CC: Nichole Senior, MD

## 2023-12-09 NOTE — Patient Instructions (Signed)
  VISIT SUMMARY: You visited us  today due to worsening shortness of breath and low oxygen  levels. We discussed your current symptoms, including increased coughing and fatigue, and reviewed your medications. We have made some adjustments to your treatment plan to help manage your conditions.  YOUR PLAN: INTERSTITIAL LUNG DISEASE WITH PULMONARY FIBROSIS AND MILD PULMONARY HYPERTENSION: Your lung disease and pulmonary hypertension have been causing increased shortness of breath and fatigue. -Continue using inhaled treprostinil (Tyvaso) at 64 mcg. -We have ordered a follow-up CT scan to check your lung condition. -We have scheduled lung function tests. -We will discuss the potential benefits of treprostinil in controlling lung scarring at your next visit.  ACUTE BRONCHITIS: You have increased coughing and yellow mucus production, which suggests bronchitis. -We have prescribed an antibiotic to treat the bronchitis. -We have also prescribed prednisone  for 5 days, despite previous mild skin irritation.  CHRONIC HYPOXEMIC RESPIRATORY FAILURE: Your oxygen  levels drop significantly with minimal exertion, and you are currently using oxygen  at home. -We have provided an oxygen  tank to ensure you do not run out of power.

## 2023-12-09 NOTE — Telephone Encounter (Signed)
 Pulmonary fibrosis. States O2 is dropping to high 70s-mid 80s with exertion at 3 lpm. Currently at [facility] for cardiac recorder loop implantation (completed) and reports SOB while ambulating to that visit. Will come to office from there. Symptoms resolve at rest.  FYI Only or Action Required?: FYI only for provider: appointment scheduled on 12/09/23.  Patient is followed in Pulmonology for Pulmonary fibrosis, last seen on 10/07/2023 by [name], Praveen, MD.  Called Nurse Triage reporting Shortness of Breath.  Symptoms began a week ago.  Interventions attempted: Rescue inhaler.  Symptoms are: gradually worsening.  Triage Disposition: No disposition on file.  Patient/caregiver understands and will follow disposition?:    Reason for Disposition  [1] Longstanding difficulty breathing (e.g., CHF, COPD, emphysema) AND [2] WORSE than normal  Answer Assessment - Initial Assessment Questions 1. RESPIRATORY STATUS: Describe your breathing? (e.g., wheezing, shortness of breath, unable to speak, severe coughing)      SOB with exertion 2. ONSET: When did this breathing problem begin?      One weel 3. PATTERN Does the difficult breathing come and go, or has it been constant since it started?      With exertion 4. SEVERITY: How bad is your breathing? (e.g., mild, moderate, severe)      Severe, resolves at rest 5. RECURRENT SYMPTOM: Have you had difficulty breathing before? If Yes, ask: When was the last time? and What happened that time?      *No Answer* 6. CARDIAC HISTORY: Do you have any history of heart disease? (e.g., heart attack, angina, bypass surgery, angioplasty)      *No Answer* 7. LUNG HISTORY: Do you have any history of lung disease?  (e.g., pulmonary embolus, asthma, emphysema)     *No Answer* 8. CAUSE: What do you think is causing the breathing problem?      *No Answer* 9. OTHER SYMPTOMS: Do you have any other symptoms? (e.g., chest pain, cough,  dizziness, fever, runny nose)     *No Answer* 10. O2 SATURATION MONITOR:  Do you use an oxygen  saturation monitor (pulse oximeter) at home? If Yes, ask: What is your reading (oxygen  level) today? What is your usual oxygen  saturation reading? (e.g., 95%)       *No Answer* 11. PREGNANCY: Is there any chance you are pregnant? When was your last menstrual period?       *No Answer* 12. TRAVEL: Have you traveled out of the country in the last month? (e.g., travel history, exposures)       *No Answer*  Protocols used: Breathing Difficulty-A-AH Copied from CRM [id number]. Topic: Clinical - Red Word Triage >> Dec 09, 2023  9:20 AM Alexander Nunez wrote: Red Word that prompted transfer to Nurse Triage: Pt has been having his oxygen  levels dropping into the 70s in the last 4-5 days. Pt is on 3L of oxygen  and uses a POC. Pt has had SOB as well.   Pt of Dr. Theophilus in [place].

## 2023-12-09 NOTE — Patient Instructions (Addendum)
 Medication Instructions:  Your physician recommends that you continue on your current medications as directed. Please refer to the Current Medication list given to you today.  Labwork: None ordered.  Testing/Procedures: None ordered.  Follow-Up:  As needed    Implantable Loop Recorder Placement, Care After This sheet gives you information about how to care for yourself after your procedure. Your health care provider may also give you more specific instructions. If you have problems or questions, contact your health care provider. What can I expect after the procedure? After the procedure, it is common to have: Soreness or discomfort near the incision. Some swelling or bruising near the incision.  Follow these instructions at home: Incision care  Monitor your cardiac device site for redness, swelling, and drainage. Call the device clinic at [phone] if you experience these symptoms or fever/chills.  Keep the large square bandage on your site for 24 hours and then you may remove it yourself.   You may shower. Avoid lotions, ointments, or perfumes over your incision until it is well-healed.  Please do not submerge in water  until your site is completely healed.   Your device is MRI compatible.   Remote monitoring is used to monitor your cardiac device from home. This monitoring is scheduled every month by our office. It allows us  to keep an eye on the function of your device to ensure it is working properly.  If your wound site starts to bleed apply pressure.    For help with the monitor please call [employer] Monitor Support Specialist directly at [phone].    If you have any questions/concerns please call the device clinic at [phone].  Activity  Return to your normal activities.  General instructions Follow instructions from your health care provider about how to manage your implantable loop recorder and transmit the information. Learn how to activate a  recording if this is necessary for your type of device. You may go through a metal detection gate, and you may let someone hold a metal detector over your chest. Show your ID card if needed. Do not have an MRI unless you check with your health care provider first. Take over-the-counter and prescription medicines only as told by your health care provider. Keep all follow-up visits as told by your health care provider. This is important. Contact a health care provider if: You have redness, swelling, or pain around your incision. You have a fever. You have pain that is not relieved by your pain medicine. You have triggered your device because of fainting (syncope) or because of a heartbeat that feels like it is racing, slow, fluttering, or skipping (palpitations). Get help right away if you have: Chest pain. Difficulty breathing. Summary After the procedure, it is common to have soreness or discomfort near the incision. Change your dressing as told by your health care provider. Follow instructions from your health care provider about how to manage your implantable loop recorder and transmit the information. Keep all follow-up visits as told by your health care provider. This is important. This information is not intended to replace advice given to you by your health care provider. Make sure you discuss any questions you have with your health care provider. Document Released: 01/01/2015 Document Revised: 03/07/2017 Document Reviewed: 03/07/2017 Elsevier Patient Education  2020 [employer].

## 2023-12-11 DIAGNOSIS — Z1389 Encounter for screening for other disorder: Secondary | ICD-10-CM | POA: Diagnosis not present

## 2023-12-14 ENCOUNTER — Ambulatory Visit: Payer: Self-pay | Admitting: Pulmonary Disease

## 2023-12-26 ENCOUNTER — Ambulatory Visit (HOSPITAL_COMMUNITY)
Admission: RE | Admit: 2023-12-26 | Discharge: 2023-12-26 | Disposition: A | Discharge status: Home / Self Care | Source: Ambulatory Visit | Attending: Pulmonary Disease | Admitting: Pulmonary Disease

## 2023-12-26 DIAGNOSIS — J849 Interstitial pulmonary disease, unspecified: Secondary | ICD-10-CM | POA: Insufficient documentation

## 2023-12-26 DIAGNOSIS — J8410 Pulmonary fibrosis, unspecified: Secondary | ICD-10-CM | POA: Diagnosis not present

## 2023-12-26 DIAGNOSIS — I700 Atherosclerosis of aorta: Secondary | ICD-10-CM | POA: Diagnosis not present

## 2023-12-26 DIAGNOSIS — J438 Other emphysema: Secondary | ICD-10-CM | POA: Diagnosis not present

## 2023-12-26 DIAGNOSIS — R918 Other nonspecific abnormal finding of lung field: Secondary | ICD-10-CM | POA: Diagnosis not present

## 2024-01-06 ENCOUNTER — Ambulatory Visit: Admitting: Pulmonary Disease

## 2024-01-06 ENCOUNTER — Encounter: Payer: Self-pay | Admitting: Pulmonary Disease

## 2024-01-06 VITALS — BP 110/70 | HR 60 | Temp 98.2°F | Ht 71.0 in | Wt 221.0 lb

## 2024-01-06 DIAGNOSIS — Z5181 Encounter for therapeutic drug level monitoring: Secondary | ICD-10-CM | POA: Diagnosis not present

## 2024-01-06 DIAGNOSIS — J849 Interstitial pulmonary disease, unspecified: Secondary | ICD-10-CM

## 2024-01-06 NOTE — Progress Notes (Signed)
 Alexander Nunez    [id number]    Jan 04, 1946  Primary Care Physician:South, Garnette, MD  Referring Physician: Nichole Garnette, MD [address] [place],  [place] [address]  Problem list:  Follow-up for ILD, pulmonary fibrosis On Ofev  since Jan 2023 Cellcept  and myfortic  poorly tolerated in 2024 On Azathoprine since July 2024, dose increased to 100 mg in Oct 2024 and to 150mg  on June 2025 Started inhaled treprostinil September 2025  HPI: 78 y.o. with history of interstitial lung disease. hypertension, hyperlipidemia, sleep apnea, psoriasis  2020 Had an incidental finding of interstitial lung disease on CT abdomen which was done for diverticulitis He has seen Dr. Katherene Gelineau at [place] who did a high resolution CT with findings of alternate diagnosis, possible HP.  He had HP panel on 12/28/2018 which is negative. History notable for psoriasis with skin involvement.  He was on methotrexate for a couple of years and stopped as his skin rash improved.  Last dose of methotrexate was several years ago.  2021 Had COVID-19 infection December 2021, referred for monoclonal antibody  2023 Discussed at multidisciplinary conference on 10/15/2021 CTs show progressive interstitial lung disease and alternate pattern. Differential diagnoses include chronic HP versus NSIP.  Recommend a biopsy and would favor surgical lung biopsy as transbronchial and BAL may not give a definitive diagnosis. MDC see also recommends empiric treatment with antifibrotic's +/- anti-inflammatory if pathology is suggestive.  Started Ofev  in first week of Jan 2023.  He is tolerating it well so far with no issue Has started pulmonary rehab and is doing well States that breathing is better.  2024 Started on CellCept  in February 2024.  He developed rash on his skin immediately after.  He was treated with antifungal cream and discontinuation of CellCept  with improvement in symptoms.  The symptoms recurred after he  rechallenge himself with CellCept  and is now completely off the medication. Started my Myfortic  in April 2024 this was also poorly tolerated with weight loss and fatigue and he is off this medication as well Started azathioprine   2025 Developed COVID infection in March 2025, treated with Paxlovid Azathioprine  dose titrated up to 150 mg daily on June 2025 Cardiac cath June 2025 showed very mild PAH.  Started inhaled treprostinil September 2025  History of Present Illness Interim history:  Alexander Nunez is a 78 year old male with pulmonary fibrosis and pancreatic insufficiency who presents with worsening shortness of breath and gastrointestinal discomfort.  Dyspnea and hypoxemia - Worsening shortness of breath over the past 1 to 2 weeks, especially with exertion - Home oximetry readings dropping into the high 70s - Oxygen  saturations recover but take longer after use of inhaled medication - Uses continuous flow oxygen  at 3.5 L at home, increases to 5 L when out, but still becomes short of breath and must stop to recover  Pulmonary fibrosis, pulmonary hypertension and interstitial lung disease management - Diagnosed with pulmonary fibrosis and interstitial lung disease - Current treatment includes azathioprine , nintedanib, and inhaled treprostinil (Tyvaso) 64 micrograms - Cough and oxygen  desaturation occur after Tyvaso use  Gastrointestinal symptoms and pancreatic insufficiency - Gastrointestinal pressure and significant gas, perceived to worsen breathing by affecting the diaphragm - Takes Creon  for pancreatic insufficiency with improved diarrhea - Uses Gas-X for gas symptoms - Scheduled to follow up with gastroenterology  Physical activity limitation and rehabilitation - Completed pulmonary rehabilitation previously - Hip bursitis limits activity - Plans to use the new senior center  gym to maintain physical activity   Relevant pulmonary history Pets: Has a dog.  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  His hobbies include gardening but no significant exposure to mold,No hot tub, Jacuzzi, no down pillows or comforters.  He uses talcum powder every day.  May have had Freon leak from his HVAC system. Likes to play golf.   Smoking history: 30-pack-year smoker.  Quit in 1980 Travel history: No significant travel history Relevant family history: No significant family history of lung disease.  Outpatient Encounter Medications as of 01/06/2024  Medication Sig   acetaminophen  (TYLENOL ) 500 MG tablet Take 1,000 mg by mouth every 6 (six) hours as needed for mild pain (pain score 1-3).   albuterol  (VENTOLIN  HFA) 108 (90 Base) MCG/ACT inhaler Inhale 1-2 puffs into the lungs every 4 (four) hours as needed for shortness of breath or wheezing.   ALPRAZolam  (XANAX ) 0.5 MG tablet Take 0.25-0.5 mg by mouth 3 (three) times daily as needed for sleep.   azaTHIOprine  (IMURAN ) 50 MG tablet Take 3 tablets (150 mg total) by mouth daily.   doxazosin  (CARDURA ) 4 MG tablet Take 4 mg by mouth at bedtime.     fluticasone  (FLONASE ) 50 MCG/ACT nasal spray Place 2 sprays into both nostrils daily.   guaiFENesin  (MUCINEX ) 600 MG 12 hr tablet Take 600 mg by mouth 2 (two) times daily as needed for to loosen phlegm or cough.   lipase/protease/amylase (CREON ) 36000 UNITS CPEP capsule Take 2 capsules (72,000 Units total) by mouth 3 (three) times daily with meals. May also take 1 capsule (36,000 Units total) as needed (with snacks - up to 4 snacks daily).   losartan (COZAAR) 25 MG tablet Take 25 mg by mouth daily.   metoprolol  tartrate (LOPRESSOR ) 50 MG tablet Take 25 mg by mouth 2 (two) times daily.   Nintedanib (OFEV ) 150 MG CAPS Take 1 capsule (150 mg total) by mouth 2 (two) times daily.   pantoprazole   (PROTONIX ) 40 MG tablet Take 1 tablet (40 mg total) by mouth daily.   pravastatin  (PRAVACHOL ) 20 MG tablet Take 20 mg by mouth every evening.    simethicone  (MYLICON) 125 MG chewable tablet Chew 250 mg by mouth every 6 (six) hours as needed for flatulence.   Treprostinil (TYVASO DPI TITRATION KIT) 16 & 32 & 48 MCG POWD Inhale into the lungs in the morning, at noon, in the evening, and at bedtime.   azithromycin  (ZITHROMAX ) 250 MG tablet Take 1 tablet (250 mg total) by mouth daily. Take 2 tabs on day 1 and then 1 tab on day 2-4 (Patient not taking: Reported on 01/06/2024)   No facility-administered encounter medications on file as of 01/06/2024.   Vitals:   01/06/24 0923  BP: 110/70  Pulse: 60  Temp: 98.2 F (36.8 C)  Height: 5' 11 (1.803 m)  Weight: 221 lb (100.2 kg)  SpO2: 99% Comment: 3L POC  TempSrc: Oral  BMI (Calculated): 30.84    Physical Exam GEN: No acute distress CV: Regular rate and rhythm no murmurs LUNGS: Crackles present, no infection or congestion, normal respiratory effort SKIN JOINTS: Warm and dry no rash    Data Reviewed: Imaging: High-resolution CT 12/24/2018-mild emphysema, traction bronchiectasis, patchy groundglass and reticulation.  More prominent in the upper lobes with no basal gradient.  Air-trapping present. High-res CT 09/19/2019-stable to  minimally changed interstitial lung disease in alternate pattern  CT 03/25/2021-interstitial lung disease with superimposed groundglass opacities in the right upper and left lower lobe.  High-resolution CT 09/12/2021-upper lobe predominant fibrotic interstitial lung disease and alternate pattern.  High resolution CT 11/28/2022-stable/minimally progressive findings of interstitial lung disease in alternate pattern.  High-resolution CT 08/10/2023-progressive fibrotic changes in alternate pattern  High-resolution CT 12/26/2023-stable pattern of pulmonary fibrosis compared to July 2025 I have reviewed the images  personally.  PFTs: 01/06/2019 FVC 3 [66%], FEV1 2.61 [78%], F/F 87, TLC 4.33 [59%], DLCO 22.56 [85%] Severe restriction with no obstruction.  Diffusion capacity is normal  10/03/2019 FVC 2.90 [64%], FEV1 2.47 [94%], DLCO 21.35 [81%] Moderate restriction  08/29/2020 FVC 2.14 [1%], FEV1 2.42 [74%], TLC 4.55 [62%], DLCO 19.75 [75%] Moderate restriction, mild diffusion defect  01/08/2022 FVC 2.29 [52%], FEV1 2.05 [64%], F/F89, TLC 3.70 [51%], DLCO 14.84 [57%]  11/13/2022 FVC 2.22 [50%], FEV1 2.00 [62%], F/F90, TLC 3.92 [54%], DLCO 11.54 [44%] Moderate restriction, moderate diffusion defect  07/10/2023 FVC 1.91 [44%], FEV1 1.77 [56%], F/F93, TLC 2.69 [37%], DLCO 9.11 [35%] Severe restriction, severe diffusion defect.  Diffusion defect is not reliable due to not reproducibility  Labs: Hypersensitivity panel 12/28/2018- Negative CTD profile 02/10/2019- Negative  Sleep: Home sleep study 08/03/2021 Moderate OSA with AHI 28.6, low O2 sat of 81%. Assessment & Plan Interstitial lung disease CT scan reviewed with alternate pattern of pulmonary fibrosis with upper lobe predominance.  This could be hypersensitivity pneumonitis though he does not have any significant exposures. Lab work including CTD serologies, hypersensitivity panel is negative.  Has remote exposure to asbestos but CT scan is not typical of asbestosis.  We had multiple discussions for further work-up in detail with patient, including wait and watch with monitoring, bronchoscope with BAL, transbronchial biopsies or surgical lung biopsy.  He is very reluctant to undergo an invasive procedure. Per multidisciplinary conference recommendations for biopsy, treatment with antifibrotic's +/- anti-inflammatory.  After long discussion with patient and family we have decided to start treatment with antifibrotics in 2023 He is tolerating Ofev  well so far.  CellCept  tried 2024 but developed a rash. Myfortic  was poorly tolerated as well. On  azathioprine  dose at 150 mg/day.   In spite of above treatment he continues to decline.  We discussed potential addition of Rituxan. Consideration of lung transplant at [facility] due to proximity and new program. He is open to exploring transplant options despite potential insurance.  He likely will not be a candidate due to age but wants to get an evaluation. - Continue azathioprine , nintedanib, and inhaled treprostinil. - Will check insurance approval for Rituxan infusion. - Referred to lung [facility] program at [facility]. - Monitor oxygen  levels during activities.  Increase O2 prescription to 5 L  Mild pulmonary hypertension Right heart catheterization indicates very mild pulmonary hypertension. Lung scarring is progressing, as seen in the July scan. He experiences shortness of breath and requires oxygen  during exertion. Early treatment is recommended to prevent further complications, as scarring can increase pressure on the right side of the heart. -Currently on inhaled treprostinil (Tyvaso) at 64 mcg, the highest dose, which may help with pulmonary hypertension and potentially control scarring.   Exocrine pancreatic insufficiency with diarrhea and weight loss Irritable bowel syndrome Exocrine pancreatic insufficiency confirmed after colonoscopy and pathology ruled out infection. Currently on Creon , with weight stabilization but intermittent diarrhea. Ofev , which can contribute to diarrhea, is continued. Creon  dosage increased to four pills with meals and two with  snacks. - Continue Creon  with increased dosage to four pills with meals. - Consider carob powder if Creon  does not adequately control diarrhea. - Continue Ofev  at 150 mg twice daily. - Monitor weight and symptoms of diarrhea. Discussed alternative medication pirfenidone, which does not have diarrhea as a side effect, but prefers to continue with Ofev  due to established approval and setup.   OSA He has diagnosis of OSA 30 years  ago.  On auto CPAP with supplemental oxygen  Start supplemental oxygen  with CPAP  Plan/Recommendations: - Check  labs for monitoring - Continue nintedanib, azathioprine , inhaled treprostinil - Continue supplemental oxygen  - Start paperwork for rituximab.  Lung transplant evaluation.  I personally spent a total of 45 minutes in the care of the patient today including preparing to see the patient, getting/reviewing separately obtained history, performing a medically appropriate exam/evaluation, referring and communicating with other health care professionals, documenting clinical information in the EHR, independently interpreting results, communicating results, and coordinating care.   Lonna Coder MD [facility] 01/06/2024, 9:31 AM  CC: Nichole Senior, MD

## 2024-01-07 ENCOUNTER — Ambulatory Visit (INDEPENDENT_AMBULATORY_CARE_PROVIDER_SITE_OTHER): Admitting: Gastroenterology

## 2024-01-07 ENCOUNTER — Encounter (INDEPENDENT_AMBULATORY_CARE_PROVIDER_SITE_OTHER): Payer: Self-pay | Admitting: Gastroenterology

## 2024-01-07 VITALS — BP 100/58 | HR 59 | Temp 98.2°F | Ht 71.0 in | Wt 220.7 lb

## 2024-01-07 DIAGNOSIS — R142 Eructation: Secondary | ICD-10-CM

## 2024-01-07 DIAGNOSIS — R197 Diarrhea, unspecified: Secondary | ICD-10-CM | POA: Diagnosis not present

## 2024-01-07 DIAGNOSIS — K219 Gastro-esophageal reflux disease without esophagitis: Secondary | ICD-10-CM

## 2024-01-07 DIAGNOSIS — K8681 Exocrine pancreatic insufficiency: Secondary | ICD-10-CM | POA: Diagnosis not present

## 2024-01-07 MED ORDER — OMEPRAZOLE 40 MG PO CPDR: 40.0000 mg | DELAYED_RELEASE_CAPSULE | Freq: Every day | ORAL | 1 refills | Status: AC

## 2024-01-07 NOTE — Patient Instructions (Addendum)
-  continue creon  144k with meals 108k with snacks -continue protonix  40mg  daily -you can try over the counter IBgard to help with gas -stop protonix  -start omeprazole 40mg   Let me know if symptoms are not improving in the next couple of weeks  Follow up 3 months

## 2024-01-07 NOTE — Progress Notes (Signed)
 Referring Provider: Nichole Senior, MD Primary Care Physician:  Nichole Senior, MD Primary GI Physician: Dr. Cinderella   Chief Complaint  Patient presents with   Follow-up    Pt arrives for follow up. Pt states for the past week and a half he has had a lot of gas build up in epigastric region. Pt reports he has been taking Gas-X. Possible flare up of IBS. Weight has been stable. Diarrhea has improved.    HPI:   Alexander Nunez is a 78 y.o. male with past medical history of f anxiety, arthritis, diverticulitis, GERD, high cholesterol, HTN, IBS, osteoporosis, psoriasis, pulmonary fibrosis, sleep apnea, anal fissure   Patient presenting today for:  Follow up of diarrhea thought secondary to EPI GERD/belching   Last seen September, at that time diarrhea coming and going, taking imodium PRN, has some gas, increased creon  to 3 capsules with meals and 2 with snacks, seeing some improvement.   Recommended to increase creon  to 114kunits with meals and 108k units with snacks  Labs in November with grossly normal CMP, CBC with hgb 12   Present:  States diarrhea seems well controlled with increase in creon , taking 4 with meals and 3 with snacks. He still has some looser stools at times but is having some intermittent solid stools. Sometimes may skip a day without a BM but other days he may have 2-4 BMs per day. No rectal bleeding or melena.   Notes more gas build up in his upper abdomen/chest for the past few weeks. He is taking gas x which seems to help some. He feels pressure in this area and notes he can belch some which seems to help as well. Denies any heartburn or acid regurgitation. He is taking protonix  40mg  daily. Had a recent antibiotic (zithromax ) but he is finished with that. Recently started a new medication for pulmonary hypertension (treprostinil) prior to onset of symptoms.   Feels appetite is good. Weight is stable.    Previous testing: Celiac panel negative Fecal fat  normal Pancreatic elastase 158   Last Colonoscopy:08/25/23 - Diffuse moderate inflammation was found in the                            recto-sigmoid colon, rule out inflammatory bowel                            disease.                           - Nodular mucosa in the sigmoid colon. Injected.                            Biopsied; no removal was performed given no                            distinct borders                           - Diverticulosis in the left colon.                           - The examined portion of the ileum was normal.  Biopsied.                           - Non-bleeding internal hemorrhoids.                           - Granularity in the entire examined colon.                            Biopsied.                           - Biopsies were obtained in the rectum, in the                            sigmoid colon, in the descending colon, in the                            transverse colon, in the ascending colon and in the                            cecum.   A. TERMINAL ILEUM, BIOPSY:  Ileal mucosa with benign lymphoid aggregates.  No active inflammation, chronic changes or granulomas.   B. COLON, CECAL, ASCENDING, BIOPSY:  Unremarkable colonic mucosa.  Negative for microscopic colitis, active inflammation and granulomas.   C. COLON, TRANSVERSE, DESCENDING, BIOPSY:  Unremarkable colonic mucosa.  Negative for microscopic colitis, active inflammation and granulomas.   D. COLON, SIGMOID, BIOPSY:  Unremarkable colonic mucosa.  Negative for microscopic colitis, active inflammation and granulomas.   E. COLON, SIGMOID, POLYPECTOMY:  Benign colonic mucosa.  Multiple additional levels examined.   F. RECTAL, BIOPSY:  Unremarkable colonic mucosa.  Negative for microscopic colitis, active inflammation and granulomas.  CT A/P without contrast: 12/2022 Diverticulosis of descending and sigmoid colon without inflammation. Small amount of free  fluid is noted in the pelvis of uncertain etiology. No other acute abnormality seen in the abdomen or pelvis. No changes of pancreatitis or inflammatory changes of pancreas EGD: 05/2023 - Normal esophagus.                           - Gastritis. Biopsied.                           - Normal duodenal bulb and second portion of the                            duodenum.A. STOMACH, RANDOM, BIOPSY:  Gastric antral / oxyntic mucosa without significant diagnostic  alteration.  No H. pylori identified on HE stain.  Negative for intestinal metaplasia or dysplasia.    Past Medical History:  Diagnosis Date   Anxiety    Arthritis    back   Back pain, chronic    Colon polyp    Diverticulitis    GERD (gastroesophageal reflux disease)    Heart palpitations    High cholesterol    Hypertension    Irritable bowel syndrome    Osteoporosis    Psoriasis    Pulmonary fibrosis (HCC)    Pulmonary fibrosis (HCC)    Sleep apnea  c- pap    Past Surgical History:  Procedure Laterality Date   BACK SURGERY  2015   CHOLECYSTECTOMY N/A 10/12/2015   Procedure: LAPAROSCOPIC CHOLECYSTECTOMY;  Surgeon: Oneil Budge, MD;  Location: [facility];  Service: General;  Laterality: N/A;   COLONOSCOPY     COLONOSCOPY N/A 09/18/2023   Procedure: COLONOSCOPY;  Surgeon: Cinderella Deatrice FALCON, MD;  Location: [facility];  Service: Endoscopy;  Laterality: N/A;  815am, asa 3, needs random colonic bx   ESOPHAGOGASTRODUODENOSCOPY N/A 05/07/2023   Procedure: EGD (ESOPHAGOGASTRODUODENOSCOPY);  Surgeon: Cinderella Deatrice FALCON, MD;  Location: [facility];  Service: Endoscopy;  Laterality: N/A;  2:00PM;ASA 3   FOOT FRACTURE SURGERY     left foot   KNEE ARTHROSCOPY     left   RIGHT HEART CATH N/A 09/02/2023   Procedure: RIGHT HEART CATH;  Surgeon: Cherrie Toribio SAUNDERS, MD;  Location: [facility];  Service: Cardiovascular;  Laterality: N/A;   TONSILLECTOMY     UPPER GASTROINTESTINAL ENDOSCOPY      Current Outpatient Medications   Medication Sig Dispense Refill   acetaminophen  (TYLENOL ) 500 MG tablet Take 1,000 mg by mouth every 6 (six) hours as needed for mild pain (pain score 1-3).     albuterol  (VENTOLIN  HFA) 108 (90 Base) MCG/ACT inhaler Inhale 1-2 puffs into the lungs every 4 (four) hours as needed for shortness of breath or wheezing. 6.7 g 5   ALPRAZolam  (XANAX ) 0.5 MG tablet Take 0.25-0.5 mg by mouth 3 (three) times daily as needed for sleep.     azaTHIOprine  (IMURAN ) 50 MG tablet Take 3 tablets (150 mg total) by mouth daily. 270 tablet 3   doxazosin  (CARDURA ) 4 MG tablet Take 4 mg by mouth at bedtime.       fluticasone  (FLONASE ) 50 MCG/ACT nasal spray Place 2 sprays into both nostrils daily. 16 g 0   guaiFENesin  (MUCINEX ) 600 MG 12 hr tablet Take 600 mg by mouth 2 (two) times daily as needed for to loosen phlegm or cough.     lipase/protease/amylase (CREON ) 36000 UNITS CPEP capsule Take 2 capsules (72,000 Units total) by mouth 3 (three) times daily with meals. May also take 1 capsule (36,000 Units total) as needed (with snacks - up to 4 snacks daily). 300 capsule 5   losartan (COZAAR) 25 MG tablet Take 25 mg by mouth daily.     metoprolol  tartrate (LOPRESSOR ) 50 MG tablet Take 25 mg by mouth 2 (two) times daily.     Nintedanib (OFEV ) 150 MG CAPS Take 1 capsule (150 mg total) by mouth 2 (two) times daily. 180 capsule 1   pantoprazole  (PROTONIX ) 40 MG tablet Take 1 tablet (40 mg total) by mouth daily. 30 tablet 1   pravastatin  (PRAVACHOL ) 20 MG tablet Take 20 mg by mouth every evening.      simethicone  (MYLICON) 125 MG chewable tablet Chew 250 mg by mouth every 6 (six) hours as needed for flatulence.     Treprostinil (TYVASO DPI TITRATION KIT) 16 & 32 & 48 MCG POWD Inhale into the lungs in the morning, at noon, in the evening, and at bedtime.     No current facility-administered medications for this visit.    Allergies as of 01/07/2024 - Review Complete 01/07/2024  Allergen Reaction Noted   Cephalexin Other (See  Comments) 03/07/2008   Iodinated contrast media Hives, Rash, and Dermatitis 09/17/2011   Prednisone  Hives and Itching 08/19/2023    Social History   Socioeconomic History   Marital status:  Married    Spouse name: Not on file   Number of children: 2   Years of education: Not on file   Highest education level: Not on file  Occupational History   Occupation: retired    Comment: Retired  Tobacco Use   Smoking status: Former    Current packs/day: 0.00    Average packs/day: 2.0 packs/day for 11.0 years (22.0 ttl pk-yrs)    Types: Cigarettes    Start date: 05/22/1962    Quit date: 05/21/1973    Years since quitting: 50.6   Smokeless tobacco: Never   Tobacco comments:    Quit 25 yrs now   Vaping Use   Vaping status: Never Used  Substance and Sexual Activity   Alcohol use: Not Currently    Alcohol/week: 14.0 standard drinks of alcohol    Types: 14 Shots of liquor per week    Comment: no ETOH in 4 months as of 09/16/2023   Drug use: No   Sexual activity: Yes    Partners: Female  Other Topics Concern   Not on file  Social History Narrative   Has dog named Advice Worker   Social Drivers of Corporate Investment Banker Strain: Not on file  Food Insecurity: Not on file  Transportation Needs: Not on file  Physical Activity: Not on file  Stress: Not on file  Social Connections: Not on file    Review of systems General: negative for malaise, night sweats, fever, chills, weight loss Neck: Negative for lumps, goiter, pain and significant neck swelling Resp: Negative for cough, wheezing, dyspnea at rest CV: Negative for chest pain, leg swelling, palpitations, orthopnea GI: denies melena, hematochezia, nausea, vomiting, constipation, dysphagia, odyonophagia, early satiety or unintentional weight loss. +belching +gas pain/pressure  +looser stools  MSK: Negative for joint pain or swelling, back pain, and muscle pain. Derm: Negative for itching or rash Psych: Denies depression, anxiety,  memory loss, confusion. No homicidal or suicidal ideation.  Heme: Negative for prolonged bleeding, bruising easily, and swollen nodes. Endocrine: Negative for cold or heat intolerance, polyuria, polydipsia and goiter. Neuro: negative for tremor, gait imbalance, syncope and seizures. The remainder of the review of systems is noncontributory.  Physical Exam: There were no vitals taken for this visit. General:   Alert and oriented. No distress noted. Pleasant and cooperative.  Head:  Normocephalic and atraumatic. Eyes:  Conjuctiva clear without scleral icterus. Mouth:  Oral mucosa pink and moist. Good dentition. No lesions. Heart: Normal rate and rhythm, s1 and s2 heart sounds present.  Lungs: Clear lung sounds in all lobes. Respirations equal and unlabored. Abdomen:  +BS, soft, non-tender and non-distended. No rebound or guarding. No HSM or masses noted. Derm: No palmar erythema or jaundice Msk:  Symmetrical without gross deformities. Normal posture. Extremities:  Without edema. Neurologic:  Alert and  oriented x4 Psych:  Alert and cooperative. Normal mood and affect.  Invalid input(s): 6 MONTHS   ASSESSMENT: CORDAI RODRIGUE is a 78 y.o. male presenting today for follow up of diarrhea suspected secondary to EPI, GERD/Belching  Diarrhea/EPI: diarrhea has improved on creon , weight appears stable. Currently doing creon  144k units with meals and 108k units with snacks, having looser to formed stools. For now, will continue with current dosage of creon .   GERD/Belching: currently on protonix  40mg  daily. Notes more belching/sensation of gas pain in his upper abdomen recently. Belching does improve his symptoms. No real heartburn or reflux that he has noted. Taking gas x with some improvement. He has  notably been on protonix  for many years. He recently started a new inhaled medication (treprostinil) for his pulmonary htn, query if this has precipitated his UGI symptoms. For now, will try change  in PPI therapy to omeprazole  40mg  daily and recommended he try IBgard to help with symptoms as well. He should update me in a couple of weeks if symptoms are not improving.    PLAN:  -continue creon  144k with meals 108k with snacks  -IBgard  -stop protonix  -start omeprazole  40mg  -pt to make me aware if symptoms not improving in the next few weeks   All questions were answered, patient verbalized understanding and is in agreement with plan as outlined above.    Follow Up: 3 months   Patric Vanpelt L. Lorece Keach, MSN, APRN, AGNP-C Adult-Gerontology Nurse Practitioner [facility]

## 2024-01-09 ENCOUNTER — Ambulatory Visit: Attending: Student in an Organized Health Care Education/Training Program

## 2024-01-09 DIAGNOSIS — I480 Paroxysmal atrial fibrillation: Secondary | ICD-10-CM | POA: Diagnosis not present

## 2024-01-11 LAB — CUP PACEART REMOTE DEVICE CHECK
Date Time Interrogation Session: 20251206130800
Implantable Pulse Generator Implant Date: 20251105
Pulse Gen Serial Number: 164352

## 2024-01-12 ENCOUNTER — Ambulatory Visit: Attending: Pulmonary Disease

## 2024-01-12 ENCOUNTER — Telehealth: Payer: Self-pay

## 2024-01-12 DIAGNOSIS — J849 Interstitial pulmonary disease, unspecified: Secondary | ICD-10-CM

## 2024-01-12 DIAGNOSIS — Z79899 Other long term (current) drug therapy: Secondary | ICD-10-CM

## 2024-01-12 NOTE — Progress Notes (Signed)
  Pharmacotherapy Clinic  Referring Provider: Dr. Theophilus  Virtual Visit via Telephone Note  I connected with Alexander Nunez on 01/12/24 at  3:30 PM EST by telephone and verified that I am speaking with the correct person using two identifiers.  Location: Patient: home Provider: office   I discussed the limitations, risks, security and privacy concerns of performing an evaluation and management service by telephone and the availability of in person appointments. I also discussed with the patient that there may be a patient responsible charge related to this service. The patient expressed understanding and agreed to proceed.  Subjective: Patient presents via telephone, accompanied by spouse who is present over the phone, today to [facility] for counseling on rituximab for ILD/pulmonary fibrosis.  Current therapy includes: azathioprine  and Ofev . PMH includes pulmonary hypertension for which he is taking Tyvaso, which may also help control scarring.  Objective:  CBC    Component Value Date/Time   WBC 10.0 12/09/2023 1100   RBC 3.67 (L) 12/09/2023 1100   HGB 12.0 (L) 12/09/2023 1100   HCT 36.6 (L) 12/09/2023 1100   PLT 288.0 12/09/2023 1100   MCV 99.9 12/09/2023 1100   MCH 32.8 09/02/2023 0838   MCHC 32.8 12/09/2023 1100   RDW 15.3 12/09/2023 1100   LYMPHSABS 1.6 12/09/2023 1100   MONOABS 0.8 12/09/2023 1100   EOSABS 0.3 12/09/2023 1100   BASOSABS 0.1 12/09/2023 1100     CMP     Component Value Date/Time   NA 137 12/09/2023 1100   K 4.3 12/09/2023 1100   CL 100 12/09/2023 1100   CO2 32 12/09/2023 1100   GLUCOSE 95 12/09/2023 1100   BUN 20 12/09/2023 1100   CREATININE 1.23 12/09/2023 1100   CREATININE 1.28 11/28/2022 1534   CALCIUM 9.5 12/09/2023 1100   PROT 7.7 12/09/2023 1100   ALBUMIN 4.0 12/09/2023 1100   AST 15 12/09/2023 1100   ALT 6 12/09/2023 1100   ALKPHOS 43 12/09/2023 1100   BILITOT 0.4 12/09/2023 1100   GFRNONAA 59 (L)  09/02/2023 0838   GFRAA >60 03/13/2019 1330      Baseline Immunosuppressant Therapy Labs TB Gold, hepatitis B, hepatitis C labs not available for review   HIV Lab Results  Component Value Date   HIV Non Reactive 03/26/2021   CT High Resolution: 12/26/2023 IMPRESSION: 1. Pulmonary parenchymal pattern of interstitial lung disease and air trapping, as detailed above, stable in the relatively short interval from 08/03/2023 but progressive from 09/12/2021. Fibrotic hypersensitivity pneumonitis is favored. Findings are suggestive of an alternative diagnosis (not UIP) per consensus guidelines: Diagnosis of Idiopathic Pulmonary Fibrosis: An Official ATS/ERS/JRS/ALAT Clinical Practice Guideline. Am JINNY Honey Crit Care Med Vol 198, Iss 5, [phone], Oct 04 2016.  Assessment/Plan:  Counseled patient that rituximab is an anti-CD20 agent. Counseled patient on purpose, proper use, and adverse effects of rituximab .  Reviewed the most common adverse effects including infections, diarrhea, infusion reactions (angioedema, flushing, pruritus, rash), fatigue, anemia, and neutropenia. Reviewed rare risk for PML and how this would present. Counseled patient that rituximab should be held prior to scheduled surgery. Counseled that he would need to contact infusion center if he is sick (with fever, using antibiotics) when infusion is scheduled as the date of infusion may be modified to allow resolution of illness. Counseled patient to avoid live vaccines while on rituximab. Recommend annual influenza, PCV 15 or PCV20, PCV21 or Pneumovax 23, and Shingrix as indicated.  Reviewed the importance of regular labs while  on rituximab therapy. Will monitor CBC and CMP with first infusion, second infusion, then at 1 month. Will monitor TB gold annually. Patient verbalized understanding.   PLAN:  - Obtain baseline hepatitis labs and TB gold prior to infusion referral.  He plans to obtain labs at [facility] on 01/14/24.    - Pending lab results, will place referral to infusion for Rituxan benefits investigation and infusion scheduling.   - Patient asks about Ofev  [employer] PAP renewal - denies change in household size and income. Provides consent for our office staff to complete renewal form for him.   Patient verbalizes understanding and agreement with plan.  I discussed the assessment and treatment plan with the patient. The patient was provided an opportunity to ask questions and all were answered. The patient agreed with the plan and demonstrated an understanding of the instructions.   The patient was advised to call back or seek an in-person evaluation if the symptoms worsen or if the condition fails to improve as anticipated.  I provided 22 minutes of non-face-to-face time during this encounter.  [name], PharmD, BCPS, CPP Clinical Pharmacist  [facility]  [facility]

## 2024-01-12 NOTE — Telephone Encounter (Signed)
 During pharmacotherapy visit 01/12/24, patient informs me that there is no change to household size or income in 2026. Provides consent for office staff to complete renewal form for him for Ofev  PAP renewal.   Updates to follow in this thread.

## 2024-01-13 DIAGNOSIS — J849 Interstitial pulmonary disease, unspecified: Secondary | ICD-10-CM | POA: Diagnosis not present

## 2024-01-13 NOTE — Progress Notes (Signed)
 Remote Loop Recorder Transmission

## 2024-01-14 ENCOUNTER — Other Ambulatory Visit

## 2024-01-14 DIAGNOSIS — Z79899 Other long term (current) drug therapy: Secondary | ICD-10-CM

## 2024-01-14 LAB — COMPREHENSIVE METABOLIC PANEL WITH GFR
ALT: 6 U/L (ref 0–53)
AST: 17 U/L (ref 0–37)
Albumin: 3.8 g/dL (ref 3.5–5.2)
Alkaline Phosphatase: 52 U/L (ref 39–117)
BUN: 14 mg/dL (ref 6–23)
CO2: 34 meq/L — ABNORMAL HIGH (ref 19–32)
Calcium: 9.4 mg/dL (ref 8.4–10.5)
Chloride: 101 meq/L (ref 96–112)
Creatinine, Ser: 1.05 mg/dL (ref 0.40–1.50)
GFR: 68.38 mL/min (ref >60.00)
Glucose, Bld: 91 mg/dL (ref 70–99)
Potassium: 4 meq/L (ref 3.5–5.1)
Sodium: 140 meq/L (ref 135–145)
Total Bilirubin: 0.5 mg/dL (ref 0.2–1.2)
Total Protein: 7.2 g/dL (ref 6.0–8.3)

## 2024-01-14 LAB — CBC WITH DIFFERENTIAL/PLATELET
Basophils Absolute: 0.1 K/uL (ref 0.0–0.1)
Basophils Relative: 1 % (ref 0.0–3.0)
Eosinophils Absolute: 0.4 K/uL (ref 0.0–0.7)
Eosinophils Relative: 4.7 % (ref 0.0–5.0)
HCT: 34.7 % — ABNORMAL LOW (ref 39.0–52.0)
Hemoglobin: 11.9 g/dL — ABNORMAL LOW (ref 13.0–17.0)
Lymphocytes Relative: 19.9 % (ref 12.0–46.0)
Lymphs Abs: 1.6 K/uL (ref 0.7–4.0)
MCHC: 34.2 g/dL (ref 30.0–36.0)
MCV: 98.7 fl (ref 78.0–100.0)
Monocytes Absolute: 0.8 K/uL (ref 0.1–1.0)
Monocytes Relative: 10.1 % (ref 3.0–12.0)
Neutro Abs: 5.3 K/uL (ref 1.4–7.7)
Neutrophils Relative %: 64.3 % (ref 43.0–77.0)
Platelets: 272 K/uL (ref 150.0–400.0)
RBC: 3.52 Mil/uL — ABNORMAL LOW (ref 4.22–5.81)
RDW: 16.2 % — ABNORMAL HIGH (ref 11.5–15.5)
WBC: 8.3 K/uL (ref 4.0–10.5)

## 2024-01-15 ENCOUNTER — Ambulatory Visit: Attending: Student | Admitting: Student

## 2024-01-15 ENCOUNTER — Encounter: Payer: Self-pay | Admitting: Student

## 2024-01-15 VITALS — BP 110/56 | HR 59 | Ht 71.0 in | Wt 222.6 lb

## 2024-01-15 DIAGNOSIS — I10 Essential (primary) hypertension: Secondary | ICD-10-CM | POA: Diagnosis not present

## 2024-01-15 DIAGNOSIS — R002 Palpitations: Secondary | ICD-10-CM | POA: Diagnosis not present

## 2024-01-15 DIAGNOSIS — J849 Interstitial pulmonary disease, unspecified: Secondary | ICD-10-CM

## 2024-01-15 DIAGNOSIS — I2510 Atherosclerotic heart disease of native coronary artery without angina pectoris: Secondary | ICD-10-CM

## 2024-01-15 NOTE — Patient Instructions (Signed)
 Medication Instructions:  Your physician recommends that you continue on your current medications as directed. Please refer to the Current Medication list given to you today.  *If you need a refill on your cardiac medications before your next appointment, please call your pharmacy*  Lab Work: None If you have labs (blood work) drawn today and your tests are completely normal, you will receive your results only by: MyChart Message (if you have MyChart) OR A paper copy in the mail If you have any lab test that is abnormal or we need to change your treatment, we will call you to review the results.  Testing/Procedures: None  Follow-Up: At [facility], you and your health needs are our priority.  As part of our continuing mission to provide you with exceptional heart care, our providers are all part of one team.  This team includes your primary Cardiologist (physician) and Advanced Practice Providers or APPs (Physician Assistants and Nurse Practitioners) who all work together to provide you with the care you need, when you need it.  Your next appointment:   1 year(s)  Provider:   You may see Janelle Mediate, MD or one of the following Advanced Practice Providers on your designated Care Team:   Woodfin Hays, PA-C  [place], [place] Theotis Flake, [place]     We recommend signing up for the patient portal called "MyChart".  Sign up information is provided on this After Visit Summary.  MyChart is used to connect with patients for Virtual Visits (Telemedicine).  Patients are able to view lab/test results, encounter notes, upcoming appointments, etc.  Non-urgent messages can be sent to your provider as well.   To learn more about what you can do with MyChart, go to [email or web address].   Other Instructions

## 2024-01-15 NOTE — Progress Notes (Signed)
 Cardiology Office Note    Date:  01/15/2024  ID:  Cathy, Ropp 09-20-1945, MRN [id number] Cardiologist: Maude Emmer, MD Electrophysiologist:  Donnice DELENA Primus, MD { :  History of Present Illness:    Alexander Nunez is a 78 y.o. male with past medical history of ILD, coronary calcification by CT (low-risk NST in 06/2016), HTN, HLD, OSA and anxiety who presents to the office today for annual follow-up.  He was examined by myself in 08/2022 and reported his shortness of breath had improved since being started on Imuran  by Pulmonology. He denied any recent chest pain and palpitations had overall been well-controlled.  He was continued on Losartan 25 mg daily, Lopressor  25 mg twice daily and Pravastatin  20 mg daily  In the interim, he did undergo a RHC by Dr. Bensimhon in 08/2023 which showed minimal PAH and he did have procedurally related atrial fibrillation which was treated with Flecainide  x 1. Outpatient monitor showed predominantly normal sinus rhythm and atrial fibrillation just after his procedure. He did follow-up with Dr. Bensimhon in 10/2023 and it was recommended that he be evaluated by EP with consideration of an ILR to see if he needed to be long-term anticoagulation. He did meet with Dr. Primus in 12/2023 and underwent successful ILR placement. Was encouraged to follow-up as needed based off findings.  In talking with the patient today, he reports having baseline dyspnea on exertion in the setting of ILD and is being followed closely by Pulmonology. Says he is being referred to [facility] for consideration of a lung transplant but is not sure if he would be a candidate given his age. He denies any specific orthopnea or PND. Uses a CPAP at night. No recent lower extremity edema.  Reports occasional palpitations but no persistent symptoms and no exertional chest pain.  Studies Reviewed:   EKG: EKG is not ordered today.  RHC: 08/2023 Findings:   RA = 3 RV = 32/3 PA = 35/17  (25) PCW = 7 Fick cardiac output/index = 5.0/2.2 Thermo CO/CI = 6.5/2.9 PVR = 2.8 WU (thermo) 3.5 (Fick) Ao sat = 99% PA sat = 62%, 65% SVC = 63% PAPi = 6.0   Assessment: 1. Very mild PAH with normal left-sided filling pressures and outputs (suspect Thermodilution more accurate) 2. Patient developed AF with catheter manipulation in RA.    Plan/Discussion:    Minimal PAH. Procedurally related AF. Will treat with flecainide  x 1 and place Zio x 14 days. Will not start AC if breaks with flecainide  given no previous h/o AF.   Event Monitor: 10/2023 Patch Wear Time:  13 days and 23 hours (2025-07-30T11:29:35-0400 to 2025-08-13T11:29:27-0400)   Patient had a min HR of 40 bpm, max HR of 150 bpm, and avg HR of 57 bpm. Predominant underlying rhythm was Sinus Rhythm. 21 Supraventricular Tachycardia runs occurred, the run with the fastest interval lasting 4 beats with a max rate of 150 bpm, the  longest lasting 11 beats with an avg rate of 109 bpm. Atrial Fibrillation occurred (<1% burden), ranging from 84-116 bpm (avg of 99 bpm), the longest lasting 2 mins 22 secs with an avg rate of 99 bpm. True duration of Atrial Fibrillation episodes  difficult to ascertain due to the presence of artifact. Atrial Fibrillation was present at activation of device. Once patient converted to sinus there were no further episodes of AF. Isolated SVEs were rare (<1.0%), SVE Couplets were rare (<1.0%), and SVE Triplets were rare (<1.0%). Isolated VEs were rare (<  1.0%), and no  VE Couplets or VE Triplets were present.   Physical Exam:   VS:  BP (!) 110/56   Pulse (!) 59   Ht 5' 11 (1.803 m)   Wt 222 lb 9.6 oz (101 kg)   SpO2 92%   BMI 31.05 kg/m    Wt Readings from Last 3 Encounters:  01/15/24 222 lb 9.6 oz (101 kg)  01/07/24 220 lb 11.2 oz (100.1 kg)  01/06/24 221 lb (100.2 kg)     GEN: Well nourished, well developed male appearing in no acute distress NECK: No JVD; No carotid bruits CARDIAC: RRR, no  murmurs, rubs, gallops RESPIRATORY:  Clear to auscultation without rales, wheezing or rhonchi  ABDOMEN: Appears non-distended. No obvious abdominal masses. EXTREMITIES: No clubbing or cyanosis. No pitting edema.  Distal pedal pulses are 2+ bilaterally.   Assessment and Plan:   1. Palpitations - He denies any recent palpitations. He did have a brief episode of atrial fibrillation at the time of RHC in 08/2023 which was felt to be due to his procedure. He did undergo ILR placement last month and most recent interrogation showed no evidence of atrial fibrillation. - Continue Lopressor  25 mg twice daily.  2. Essential hypertension - BP is at 110/56 during today's visit and he denies any associated symptoms. Typically does not check this routinely at home.  For now, continue current medical therapy with Cardura  4 mg daily (on this for BPH), Losartan 25 mg daily and Lopressor  25 mg twice daily. If BP remains soft, would reduce Losartan to 12.5 mg daily and may ultimately need to discontinue.  3. Coronary artery calcification seen on CT scan - He did have coronary calcification by prior CT imaging and NST in 06/2016 was low-risk. He has baseline dyspnea on exertion in the setting of ILD but no recent chest pain. Continue Pravastatin  20 mg daily. Reports having recent labs by his PCP and we will request a copy of these as I cannot see his recent labs in Care Everywhere. Goal LDL would be less than 70.  4. ILD (interstitial lung disease) (HCC) - Followed by [facility] Pulmonology. He remains on Imuran , Ofev  and Tyvaso. Says that he is being sent to [facility] for consideration of lung transplant but unsure if he will be a candidate given his age. May require a repeat echo and/or ischemic evaluation but unclear at this time. I encouraged him to reach out to us  if this needs to be arranged in the interim.   Signed, Laymon CHRISTELLA Qua, PA-C

## 2024-01-18 LAB — QUANTIFERON-TB GOLD PLUS
Mitogen-NIL: 7.29 [IU]/mL
NIL: 0.01 [IU]/mL
QuantiFERON-TB Gold Plus: NEGATIVE
TB1-NIL: 0 [IU]/mL
TB2-NIL: 0 [IU]/mL

## 2024-01-18 LAB — HEPATITIS B SURFACE ANTIGEN: Hepatitis B Surface Ag: NONREACTIVE

## 2024-01-18 LAB — HEPATITIS C ANTIBODY: Hepatitis C Ab: NONREACTIVE

## 2024-01-18 LAB — HEPATITIS B CORE ANTIBODY, IGM: Hep B C IgM: NONREACTIVE

## 2024-01-19 ENCOUNTER — Encounter: Payer: Self-pay | Admitting: Pulmonary Disease

## 2024-01-19 NOTE — Progress Notes (Addendum)
 Baseline labs returned. ATC patient to update regarding lab results and that infusion order is entered. LVM with my contact number to return call.  Infusion order entered for rituximab.   Provider: Dr. Theophilus Diagnosis: ILD/pulmonary fibrosis Dose: IV 1g on day 0 and day 15; repeat every 24 weeks   [name], PharmD, BCPS, CPP Clinical Pharmacist  [facility]  [facility]

## 2024-01-19 NOTE — Addendum Note (Signed)
 Addended by: Kabella Cassidy L on: 01/19/2024 03:51 PM   Modules accepted: Orders

## 2024-01-20 ENCOUNTER — Encounter: Payer: Self-pay | Admitting: Pulmonary Disease

## 2024-01-20 ENCOUNTER — Other Ambulatory Visit (HOSPITAL_COMMUNITY): Payer: Self-pay | Admitting: Pulmonary Disease

## 2024-01-20 DIAGNOSIS — Z5181 Encounter for therapeutic drug level monitoring: Secondary | ICD-10-CM | POA: Insufficient documentation

## 2024-01-20 DIAGNOSIS — Z79899 Other long term (current) drug therapy: Secondary | ICD-10-CM | POA: Insufficient documentation

## 2024-01-20 NOTE — Progress Notes (Signed)
 Patient returned my call -   I explained that rituximab infusion order was sent to infusion center - benefits investigation to follow. Infusion team will reach out to him for scheduling.   Reviewed the following additional education  - - Given he is taking azathioprine  and will be starting rituximab, pneumocystis prophylaxis recommended. Will obtain G6PD at time of first rituximab infusion. PharmD will send Rx for Bactrim  DS to pharmacy around the time of first rituximab infusion.  - We again reviewed the significant warnings in product labeling, including cardiac arrhythmia and bowel obstruction/perforation - Confirmed allergy listed to prednisone  is mild - he reports mild itching in the folds of his skin when he took prednisone . Denies reaction to methylprednisolone  which he received in May 2025. Plan to pre-medicate with methylprednisolone  prior to rituximab infusion. - Recommend to obtain pneumonia vaccine before starting rituximab infusion.   Will route message to front desk to schedule PNA vaccine.   He asks about Ofev  update - will send off paperwork and update in separate thread.   He denies further questions at this time.

## 2024-01-21 ENCOUNTER — Telehealth: Payer: Self-pay | Admitting: Pulmonary Disease

## 2024-01-21 ENCOUNTER — Telehealth: Payer: Self-pay

## 2024-01-21 NOTE — Telephone Encounter (Signed)
 I have sent an urgent message to [facility] in regards to this

## 2024-01-21 NOTE — Telephone Encounter (Signed)
 Copied from CRM #[id number]. Topic: Clinical - Order For Equipment >> Jan 21, 2024 12:39 PM Russell PARAS wrote: Reason for CRM:   Pt's wife, [place], is contacting clinic regarding the recent DME order that was placed for pt. She spoke with [facility] today and they are advising they have not received the order.  Wife requested if order could be refaxed to DME company, as well as call back once it has been sent for status update.   CB#  [phone] (Pt)  OR  [phone]

## 2024-01-21 NOTE — Telephone Encounter (Signed)
 Auth Submission: APPROVED Site of care: Site of care: [facility] Payer: [employer] Medication & CPT/J Code(s) submitted: Rituxan (Rituximab) [id number] Diagnosis Code:  Route of submission (phone, fax, portal): Latent Phone # Fax # Auth type: Buy/Bill PB Units/visits requested: 1000mg  x 2 doses Reference number: [id number] Approval from: 01/20/24 to 04/19/24

## 2024-01-22 NOTE — Telephone Encounter (Signed)
 Dolanda at [facility] will email results as soon as she gets them

## 2024-01-22 NOTE — Telephone Encounter (Signed)
 Disregard previous message Dolanda at [facility] has received order and will get it filled. NFN

## 2024-01-24 NOTE — Telephone Encounter (Signed)
 Submitted a Prior Authorization request to [facility] [employer] ADV[employer] for OFEV  via CoverMyMeds. Will update once we receive a response.  Key: [id number]   Completed application, will await PA response to fax with completed form and insurance cards.

## 2024-01-25 ENCOUNTER — Other Ambulatory Visit: Payer: Self-pay | Admitting: Pulmonary Disease

## 2024-01-25 DIAGNOSIS — J849 Interstitial pulmonary disease, unspecified: Secondary | ICD-10-CM

## 2024-01-25 NOTE — Telephone Encounter (Signed)
 PA was denied due to the following reasons: 1) need HRCT scan must show at least 10% fibrotic tissue.   Submitted an URGENT appeal with attached HRCT from Nov 2025 and most recent OV note. Will update when we receive a response.

## 2024-01-25 NOTE — Telephone Encounter (Signed)
 Received notification from [employer] regarding a prior authorization for OFEV . Authorization has been APPROVED from 01/25/2024 to 01/24/2025. Approval letter sent to scan center.  Authorization # [id number]  Ofev  PAP can be submited

## 2024-01-26 MED ORDER — OFEV 150 MG PO CAPS: 150.0000 mg | ORAL_CAPSULE | Freq: Two times a day (BID) | ORAL | 0 refills | Status: DC

## 2024-01-26 NOTE — Telephone Encounter (Signed)
 Unable to enroll in PAP as current test claim is $0.   Enrolled in grant -   Date Portal Grant Approval Letter Generated: 01/26/2024 Patients Name: Alexander Nunez [employer] Identification Number: [id number] Fund: Pulmonary Fibrosis Enrollment Period: 12/27/2023 through 12/25/2024 Lorrene Amount: $9,000.00  Patient can fill through [facility].   Called patient to discuss above. Will send Rx to [facility] in 2026.   For now, patient requests Rx be sent to [facility] where he was told they could fill one last time, but will need new Rx. Patient's current supply will last until around 02/04/24.

## 2024-01-27 ENCOUNTER — Ambulatory Visit: Payer: Self-pay | Admitting: Student in an Organized Health Care Education/Training Program

## 2024-02-01 NOTE — Telephone Encounter (Signed)
 Ofev  Rx sent to [facility] today with 0 refills - future fills to come through [facility] due to grant enrollment for 2026.

## 2024-02-03 NOTE — Addendum Note (Signed)
 Addended by: DAYNE SHERRY RAMAN on: 02/03/2024 02:15 PM   Modules accepted: Orders

## 2024-02-03 NOTE — Telephone Encounter (Signed)
 Referral placed to pharmacotherapy clinic  PA for Ofev  active through 01/24/2025  Sherry Pennant, PharmD, MPH, BCPS, CPP Clinical Pharmacist

## 2024-02-05 ENCOUNTER — Other Ambulatory Visit (HOSPITAL_COMMUNITY): Payer: Self-pay

## 2024-02-05 NOTE — Telephone Encounter (Signed)
 Per test claim, copay for 30 day supply of Ofev  is $2037.21

## 2024-02-09 ENCOUNTER — Other Ambulatory Visit: Payer: Self-pay

## 2024-02-09 ENCOUNTER — Telehealth (INDEPENDENT_AMBULATORY_CARE_PROVIDER_SITE_OTHER): Payer: Self-pay

## 2024-02-09 ENCOUNTER — Encounter: Payer: Self-pay | Admitting: Pulmonary Disease

## 2024-02-09 ENCOUNTER — Other Ambulatory Visit: Payer: Self-pay | Admitting: Pulmonary Disease

## 2024-02-09 ENCOUNTER — Ambulatory Visit: Attending: Endocrinology

## 2024-02-09 DIAGNOSIS — R002 Palpitations: Secondary | ICD-10-CM | POA: Diagnosis not present

## 2024-02-09 DIAGNOSIS — J849 Interstitial pulmonary disease, unspecified: Secondary | ICD-10-CM

## 2024-02-09 DIAGNOSIS — R918 Other nonspecific abnormal finding of lung field: Secondary | ICD-10-CM

## 2024-02-09 MED ORDER — PANCRELIPASE (LIP-PROT-AMYL) 36000-114000 UNITS PO CPEP: ORAL_CAPSULE | ORAL | 0 refills | Status: DC

## 2024-02-09 NOTE — Progress Notes (Addendum)
 [facility] follow up note  Discussed case with Dr. Dane Blanch, transplant clinic at [facility].  He feels that patient is a good transplant candidate but their transplant program will be running for the next 3 months.  He already discussed with Dr. Germaine from [facility] and recommended that we send a referral to [facility] for evaluation.  In addition he is concerned about malignancy after review of the CT scan with some ground glass changes and suggested a PET scan.  Recommended that we pause the planned Rituxan infusion as if he does go for transplant then we would not want him immunocompromised.  I also later called Renella and discussed the plan with him  Referral to [facility] for transplant consideration Order PET scan Cancel planned Rituxan infusion Order extra portable oxygen  tanks  Lonna Coder MD [facility] See Amion for pager  If no response to pager , please call [phone] until 7pm After 7:00 pm call Elink  [phone] 02/09/2024, 11:48 AM

## 2024-02-09 NOTE — Progress Notes (Signed)
 Discontinued Rituxan infusion plan per secure chat discussion with Dr. Theophilus on 02/09/24 -   Has been a change of therapeutic plan for him as he is being actively considered for lung transplantation. I spoke to the transplant team and they do not want to start him on Rituxan right now as it may interfere with potential transplantation. He is scheduled for tomorrow. Can you please cancel infusion.  Will message infusion team for assistance with cancelling appointment for infusion currently scheduled tomorrow, 02/10/24.

## 2024-02-09 NOTE — Telephone Encounter (Signed)
 Left VM regarding patient's virtual orientation today. Advised to call back [phone]. Also reminded of when in person orientation is.

## 2024-02-09 NOTE — Addendum Note (Signed)
 Addended by: MOODY RANCHER on: 02/09/2024 12:57 PM   Modules accepted: Orders

## 2024-02-09 NOTE — Telephone Encounter (Signed)
 Patient came to the office today asking for samples of Creon  36,000. We gave # 42 capsules. Documented in Chart.

## 2024-02-10 ENCOUNTER — Telehealth: Payer: Self-pay | Admitting: Pulmonary Disease

## 2024-02-10 ENCOUNTER — Ambulatory Visit

## 2024-02-10 NOTE — Telephone Encounter (Signed)
 He is asking for extra oxygen  tanks for when he has to travel longer distances for doctors appointments

## 2024-02-10 NOTE — Telephone Encounter (Signed)
 I have informed [facility]

## 2024-02-10 NOTE — Telephone Encounter (Signed)
 Per Dolanda at [facility]-This patient received a poc on 07/14/2023 which charges itself so not sure what he is asking for. Please advise

## 2024-02-11 ENCOUNTER — Telehealth: Payer: Self-pay

## 2024-02-11 NOTE — Telephone Encounter (Signed)
 Per Alexander Nunez at [facility]- Patient does not get tanks he has a portable concentrator that has power pack to keep charge for usage...if patient is needing tanks for continuous use he would have to come into a office to pick those up

## 2024-02-11 NOTE — Telephone Encounter (Signed)
 dr theophilus patient he was suppose to order some small oxygen  tanks . wanted to see did he put the order in for me did relay info that order was sent and gave patient the telephone number

## 2024-02-11 NOTE — Telephone Encounter (Signed)
 Received fax from [facility] that pt is being referred to Tyvaso PAP. Provider Attestation form must be completed. Filled out form and faxed back to [facility]. Will update when we receive a response.  Fax #: [phone]

## 2024-02-11 NOTE — Telephone Encounter (Signed)
 From what I understand patient will need to call [facility] to pick up portable tanks.  Please let him know about this.

## 2024-02-12 ENCOUNTER — Telehealth: Payer: Self-pay

## 2024-02-12 NOTE — Telephone Encounter (Signed)
 Called [facility] said the received order and will have this taken care of. Informed patient, he stated he needs this urgently by Monday. Can someone please call pt Monday 02/15/2024.Thank you.

## 2024-02-15 LAB — CUP PACEART REMOTE DEVICE CHECK
Date Time Interrogation Session: 20260110181100
Implantable Pulse Generator Implant Date: 20251105
Pulse Gen Serial Number: 164352

## 2024-02-15 NOTE — Telephone Encounter (Signed)
 Patient received tanks. nfn

## 2024-02-15 NOTE — Telephone Encounter (Signed)
 Patient LVM requesting return call. Contacted patient - he asks if we have received a fax from [facility]. I referenced information below and confirmed that we have received and returned the requested fax. He reports he called [facility] on 02/12/24 and they had not received fax yet.   Called [facility] at [phone] to confirm if they have received the fax. Spoke with representative who confirms they received our fax on 02/11/24. NFN at this time.

## 2024-02-15 NOTE — Telephone Encounter (Signed)
 Per Tawni at [facility]- Patient received tanks on Friday, 02/12/24. NFN

## 2024-02-15 NOTE — Telephone Encounter (Signed)
 Received call from [facility] representative. Per Representative, will need to re-fax form with selected drug therapy at the top for Tyvaso DPI.   Re-faxed form for Tyvaso DPI to [phone].   Representative explained she spoke with the patient and updated him.

## 2024-02-15 NOTE — Telephone Encounter (Signed)
 I have reached out to [employer] inquiring about this

## 2024-02-16 ENCOUNTER — Ambulatory Visit: Payer: Self-pay | Admitting: Student in an Organized Health Care Education/Training Program

## 2024-02-16 NOTE — Progress Notes (Signed)
 Remote Loop Recorder Transmission

## 2024-02-17 ENCOUNTER — Ambulatory Visit: Attending: Pulmonary Disease

## 2024-02-17 DIAGNOSIS — J849 Interstitial pulmonary disease, unspecified: Secondary | ICD-10-CM

## 2024-02-17 MED ORDER — OFEV 150 MG PO CAPS
150.0000 mg | ORAL_CAPSULE | Freq: Two times a day (BID) | ORAL | 4 refills | Status: AC
  Filled 2024-02-18: qty 60, 30d supply, fill #0

## 2024-02-17 NOTE — Telephone Encounter (Signed)
 Submitted diagnosis verification online.  [employer] grant information below:  ID [id number] Group [id number] BIN [id number] PCN [id number]

## 2024-02-17 NOTE — Progress Notes (Signed)
 [facility]  Referring Provider: Dr. Theophilus  Virtual Visit via Telephone Note  I connected with Alexander Nunez on 02/15/24 at  1:30 PM EST by telephone and verified that I am speaking with the correct person using two identifiers.  Location: Patient: home Provider: office   I discussed the limitations, risks, security and privacy concerns of performing an evaluation and management service by telephone and the availability of in person appointments. I also discussed with the patient that there may be a patient responsible charge related to this service. The patient expressed understanding and agreed to proceed.   HPI: Alexander Nunez is a 79 y.o. male who presents to the pharmacotherapy clinic via telephone for Ofev   follow-up counseling. Previously received through PAP, now transitioning to fill with [facility] in 2026 due to grant enrollment. Contacting patient to discuss details.    Patient Active Problem List   Diagnosis Date Noted   High risk medication use 01/20/2024   Medication monitoring encounter 01/20/2024   Proctosigmoiditis 09/18/2023   Exocrine pancreatic insufficiency 08/25/2023   Anal fissure 08/25/2023   Periumbilical pain 08/05/2023   Diarrhea 08/05/2023   Loss of weight 05/06/2023   Lower respiratory tract infection 05/10/2021   Acute rhinosinusitis 05/10/2021   Thyroid  mass 04/05/2021   Palpitations 04/05/2021   CAP (community acquired pneumonia) 03/26/2021   Chest pain 03/25/2021   ILD (interstitial lung disease) (HCC) 05/18/2019   Shortness of breath 05/18/2019   Allergic rhinitis 05/18/2019   Atypical chest pain 05/04/2014   Sleep apnea 05/04/2014   Abdominal bloating 07/22/2013   Spermatocele 05/02/2013   Chronic prostatitis 01/31/2013   Pain in testicle 01/31/2013   Weakness of left leg 08/12/2012   Generalized abdominal pain 09/02/2011   Lumbar canal stenosis 05/28/2011   Degenerative arthritis of lumbar  spine 05/28/2011   SPL (spondylolisthesis) 05/28/2011   History of colonic polyps 04/19/2009   History of colon polyps 04/19/2009   IRRITABLE BOWEL SYNDROME 04/06/2008   Belching 03/07/2008   CHANGE IN BOWELS 03/07/2008   Pain of upper abdomen 03/07/2008   Essential hypertension 03/06/2008   GERD 03/06/2008   GASTRITIS 03/06/2008   CHRONIC RESPIRATORY DISEASE ARISE PERINTL PERIOD 03/06/2008   Hyperlipidemia 02/06/2003    Patient's Medications  New Prescriptions   No medications on file  Previous Medications   ACETAMINOPHEN  (TYLENOL ) 500 MG TABLET    Take 1,000 mg by mouth every 6 (six) hours as needed for mild pain (pain score 1-3).   ALBUTEROL  (VENTOLIN  HFA) 108 (90 BASE) MCG/ACT INHALER    Inhale 1-2 puffs into the lungs every 4 (four) hours as needed for shortness of breath or wheezing.   ALPRAZOLAM  (XANAX ) 0.5 MG TABLET    Take 0.25-0.5 mg by mouth 3 (three) times daily as needed for sleep.   AZATHIOPRINE  (IMURAN ) 50 MG TABLET    Take 3 tablets (150 mg total) by mouth daily.   DOXAZOSIN  (CARDURA ) 4 MG TABLET    Take 4 mg by mouth at bedtime.     FLUTICASONE  (FLONASE ) 50 MCG/ACT NASAL SPRAY    Place 2 sprays into both nostrils daily.   GUAIFENESIN  (MUCINEX ) 600 MG 12 HR TABLET    Take 600 mg by mouth 2 (two) times daily as needed for to loosen phlegm or cough.   LIPASE/PROTEASE/AMYLASE (CREON ) 36000 UNITS CPEP CAPSULE    Take 2 capsules (72,000 Units total) by mouth 3 (three) times daily with meals. May also take 1 capsule (36,000 Units total)  as needed (with snacks - up to 4 snacks daily).   LIPASE/PROTEASE/AMYLASE (CREON ) 36000 UNITS CPEP CAPSULE    Take 2 capsules (72,000 Units total) by mouth 3 (three) times daily with meals. May also take 1 capsule (36,000 Units total) as needed (with snacks - up to 4 snacks daily).   LOSARTAN (COZAAR) 25 MG TABLET    Take 25 mg by mouth daily.   METOPROLOL  TARTRATE (LOPRESSOR ) 50 MG TABLET    Take 25 mg by mouth 2 (two) times daily.    NINTEDANIB  (OFEV ) 150 MG CAPS    Take 1 capsule (150 mg total) by mouth 2 (two) times daily.   OFEV  150 MG CAPS    TAKE 1 CAPSULE BY MOUTH TWICE DAILY (12 HOURS APART) WITH FOOD   OMEPRAZOLE  (PRILOSEC) 40 MG CAPSULE    Take 1 capsule (40 mg total) by mouth daily.   PRAVASTATIN  (PRAVACHOL ) 20 MG TABLET    Take 20 mg by mouth every evening.    SIMETHICONE  (MYLICON) 125 MG CHEWABLE TABLET    Chew 250 mg by mouth every 6 (six) hours as needed for flatulence.   TREPROSTINIL (TYVASO DPI TITRATION KIT) 16 & 32 & 48 MCG POWD    Inhale into the lungs in the morning, at noon, in the evening, and at bedtime.  Modified Medications   No medications on file  Discontinued Medications   No medications on file    Allergies: Allergies[1]  Past Medical History: Past Medical History:  Diagnosis Date   Anxiety    Arthritis    back   Back pain, chronic    Colon polyp    Diverticulitis    GERD (gastroesophageal reflux disease)    Heart palpitations    High cholesterol    Hypertension    Irritable bowel syndrome    Osteoporosis    Psoriasis    Pulmonary fibrosis (HCC)    Pulmonary fibrosis (HCC)    Sleep apnea    c- pap    Social History: Social History   Socioeconomic History   Marital status: Married    Spouse name: Not on file   Number of children: 2   Years of education: Not on file   Highest education level: Not on file  Occupational History   Occupation: retired    Comment: Retired  Tobacco Use   Smoking status: Former    Current packs/day: 0.00    Average packs/day: 2.0 packs/day for 11.0 years (22.0 ttl pk-yrs)    Types: Cigarettes    Start date: 05/22/1962    Quit date: 05/21/1973    Years since quitting: 50.7   Smokeless tobacco: Never   Tobacco comments:    Quit 25 yrs now   Vaping Use   Vaping status: Never Used  Substance and Sexual Activity   Alcohol use: Not Currently    Alcohol/week: 14.0 standard drinks of alcohol    Types: 14 Shots of liquor per week     Comment: no ETOH in 4 months as of 09/16/2023   Drug use: No   Sexual activity: Yes    Partners: Female  Other Topics Concern   Not on file  Social History Narrative   Has dog named [place]   Social Drivers of Health   Tobacco Use: Medium Risk (02/08/2024)   Received from [facility]   Patient History    Smoking Tobacco Use: Former    Smokeless Tobacco Use: Never    Passive Exposure: Past  Physicist, Medical Strain:  Not on file  Food Insecurity: Not on file  Transportation Needs: Not on file  Physical Activity: Not on file  Stress: Not on file  Social Connections: Not on file  Depression (PHQ2-9): Low Risk (06/26/2022)   Depression (PHQ2-9)    PHQ-2 Score: 1  Alcohol Screen: Not on file  Housing: Unknown (02/15/2024)   Received from [facility]   Epic    Unable to Pay for Housing in the Last Year: Not on file    Number of Times Moved in the Last Year: Not on file    At any time in the past 12 months, were you homeless or living in a shelter (including now)?: No  Utilities: Not on file  Health Literacy: Not on file   Ofev  Medication Management Thoroughly counseled patient on the efficacy, mechanism of action, dosing, administration, adverse effects, and monitoring parameters of Ofev . Patient verbalized understanding.  Goals of Therapy: Will not stop or reverse the progression of ILD. It will slow the progression of ILD.  Inhibits tyrosine kinase inhibitors which slow the fibrosis/progression of ILD -Significant reduction in the rate of disease progression was observed after treatment (61.1% [before] vs 33.3% [after], P?=?0.008) over 42 weeks.  Dosing: 150 mg (one capsule) by mouth twice daily (approx 12 hours apart). Discussed taking with food approximately 12 hours apart. Discussed that capsule should not be crushed or split.  Adverse Effects: Nausea, vomiting, diarrhea (2 in 3 patients) appetite loss, weight loss - management of diarrhea with  loperamide discussed including max use of 48 hours and max of 8 capsules per day. Abdominal pain (up to 1 in 5 patients) Nasopharyngitis (13%), UTI (6%) Risk of thrombosis (3%) and acute MI (2%) Hypertension (5%) Dizziness Fatigue (10%)  Monitoring: Monitor for diarrhea, nausea and vomiting, GI perforation, hepatotoxicity  Monitor LFTs - baseline, monthly for first 6 months, then every 3 months routinely Pregnancy test - baseline prn CBC w differential at baseline and every 3 months routinely  Access: Approval of Ofev  through: insurance and grant foundation Rx sent to: [facility]: [id number]   Medication Reconciliation A drug regimen assessment was performed, including review of allergies, interactions, disease-state management, dosing and immunization history. Medications were reviewed with the patient, including name, instructions, indication, goals of therapy, potential side effects, importance of adherence, and safe use.  Anticoagulant use: No  Plan: - CONTINUE Ofev  150mg  twice daily  - Rx triaged to [facility].   I discussed the assessment and treatment plan with the patient. The patient was provided an opportunity to ask questions and all were answered. The patient agreed with the plan and demonstrated an understanding of the instructions.   The patient was advised to call back or seek an in-person evaluation if the symptoms worsen or if the condition fails to improve as anticipated.  I provided 10 minutes of non-face-to-face time during this encounter.  [name], PharmD, BCPS, CPP Clinical Pharmacist  [facility]  [facility]     [1]  Allergies Allergen Reactions   Cephalexin Other (See Comments)    Fever    Iodinated Contrast Media Hives, Rash and Dermatitis    Had persistent rash with only 50mg  benadryl  prior to spinal injection on 09/03/2017, try 13 hr prep in future.   Prednisone  Hives and Itching

## 2024-02-18 ENCOUNTER — Other Ambulatory Visit: Payer: Self-pay

## 2024-02-18 ENCOUNTER — Other Ambulatory Visit (HOSPITAL_COMMUNITY): Payer: Self-pay

## 2024-02-18 ENCOUNTER — Encounter (HOSPITAL_COMMUNITY)

## 2024-02-18 NOTE — Telephone Encounter (Signed)
 Received a fax from  [employer] regarding an approval for TYVASO DPI patient assistance through 02/02/2025. Approval letter sent to scan center.  Fax #: [phone]    Phone: [phone]   Sherry Pennant, PharmD, MPH, BCPS, CPP Clinical Pharmacist

## 2024-02-18 NOTE — Progress Notes (Signed)
 Specialty Pharmacy Initial Fill Coordination Note  Alexander Nunez is a 79 y.o. male contacted today regarding initial fill of specialty medication(s) Nintedanib  Esylate (Ofev )   Patient requested Delivery   Delivery date: 02/22/24   Verified address: [address], [place], [place] [address]   Medication will be filled on: 02/19/24   Patient is aware of $0 copayment.    **Please call pt for refills. He is aware it may not arrive until Tuesday if it has to be ordered**

## 2024-02-19 ENCOUNTER — Other Ambulatory Visit: Payer: Self-pay

## 2024-02-25 ENCOUNTER — Telehealth (INDEPENDENT_AMBULATORY_CARE_PROVIDER_SITE_OTHER): Payer: Self-pay | Admitting: Gastroenterology

## 2024-02-25 ENCOUNTER — Other Ambulatory Visit (INDEPENDENT_AMBULATORY_CARE_PROVIDER_SITE_OTHER): Payer: Self-pay | Admitting: Gastroenterology

## 2024-02-25 NOTE — Telephone Encounter (Signed)
 Pt contacted. Pt will check with insurance to see in [employer]  is covered and will give us  a call

## 2024-02-25 NOTE — Telephone Encounter (Signed)
 Pt is aware of his follow up OV with [facility] and has questions about changing from Creon  to something else. [phone]

## 2024-02-25 NOTE — Telephone Encounter (Signed)
 Pt contacted. Pt states his Creon  is going to be over $700 to get filled. States that once he mets his deductible of $2100 he wont have to pay anything but that is a lot of money. Pt is wanting to know if we can send in something else. Please advise. Thank you  (Since he has to meet a deductible patient assistance will more than likely not help)

## 2024-02-26 ENCOUNTER — Encounter (INDEPENDENT_AMBULATORY_CARE_PROVIDER_SITE_OTHER): Payer: Self-pay

## 2024-02-26 NOTE — Telephone Encounter (Signed)
 Pt left voicemail and states to go ahead and call [facility]  into [place]

## 2024-03-01 ENCOUNTER — Other Ambulatory Visit (INDEPENDENT_AMBULATORY_CARE_PROVIDER_SITE_OTHER): Payer: Self-pay | Admitting: Gastroenterology

## 2024-03-01 MED ORDER — ZENPEP 25000-79000 UNITS PO CPEP: 1.0000 | ORAL_CAPSULE | ORAL | 1 refills | Status: AC

## 2024-03-01 MED ORDER — ZENPEP 60000-189600 UNITS PO CPEP: 1.0000 | ORAL_CAPSULE | Freq: Three times a day (TID) | ORAL | 3 refills | Status: AC

## 2024-03-01 NOTE — Telephone Encounter (Signed)
 I spoke with the patient and made him aware per [facility], She has sent this in to [facility]

## 2024-03-01 NOTE — Progress Notes (Signed)
 Please see note 02/17/24 - patient was educated in detail on the use of Ofev  prior to first dispense.   [name], PharmD, BCPS, CPP Clinical Pharmacist  [facility]  [facility]

## 2024-03-10 ENCOUNTER — Encounter (HOSPITAL_COMMUNITY): Admission: RE | Admit: 2024-03-10 | Source: Ambulatory Visit

## 2024-03-11 ENCOUNTER — Ambulatory Visit

## 2024-03-31 ENCOUNTER — Ambulatory Visit (INDEPENDENT_AMBULATORY_CARE_PROVIDER_SITE_OTHER): Admitting: Gastroenterology

## 2024-04-05 ENCOUNTER — Ambulatory Visit: Admitting: Pulmonary Disease

## 2024-04-11 ENCOUNTER — Ambulatory Visit

## 2024-05-12 ENCOUNTER — Ambulatory Visit

## 2024-06-12 ENCOUNTER — Ambulatory Visit
